# Patient Record
Sex: Male | Born: 1935
Health system: Southern US, Community
[De-identification: ages and names within clinical notes are randomized; demographics above are authoritative.]

## PROBLEM LIST (undated history)

## (undated) DIAGNOSIS — J189 Pneumonia, unspecified organism: Secondary | ICD-10-CM

## (undated) DIAGNOSIS — G4730 Sleep apnea, unspecified: Secondary | ICD-10-CM

## (undated) DIAGNOSIS — F419 Anxiety disorder, unspecified: Secondary | ICD-10-CM

## (undated) DIAGNOSIS — I519 Heart disease, unspecified: Secondary | ICD-10-CM

## (undated) DIAGNOSIS — I2510 Atherosclerotic heart disease of native coronary artery without angina pectoris: Secondary | ICD-10-CM

## (undated) DIAGNOSIS — I4892 Unspecified atrial flutter: Secondary | ICD-10-CM

## (undated) DIAGNOSIS — Z9289 Personal history of other medical treatment: Secondary | ICD-10-CM

## (undated) DIAGNOSIS — K5900 Constipation, unspecified: Secondary | ICD-10-CM

## (undated) DIAGNOSIS — R0602 Shortness of breath: Secondary | ICD-10-CM

## (undated) DIAGNOSIS — C439 Malignant melanoma of skin, unspecified: Secondary | ICD-10-CM

## (undated) DIAGNOSIS — I5023 Acute on chronic systolic (congestive) heart failure: Secondary | ICD-10-CM

## (undated) DIAGNOSIS — I10 Essential (primary) hypertension: Secondary | ICD-10-CM

## (undated) DIAGNOSIS — M1990 Unspecified osteoarthritis, unspecified site: Secondary | ICD-10-CM

## (undated) DIAGNOSIS — I499 Cardiac arrhythmia, unspecified: Secondary | ICD-10-CM

## (undated) DIAGNOSIS — E785 Hyperlipidemia, unspecified: Secondary | ICD-10-CM

## (undated) DIAGNOSIS — D369 Benign neoplasm, unspecified site: Secondary | ICD-10-CM

## (undated) DIAGNOSIS — K219 Gastro-esophageal reflux disease without esophagitis: Secondary | ICD-10-CM

## (undated) DIAGNOSIS — S82899A Other fracture of unspecified lower leg, initial encounter for closed fracture: Secondary | ICD-10-CM

## (undated) DIAGNOSIS — F329 Major depressive disorder, single episode, unspecified: Secondary | ICD-10-CM

## (undated) DIAGNOSIS — I509 Heart failure, unspecified: Secondary | ICD-10-CM

## (undated) DIAGNOSIS — M4800 Spinal stenosis, site unspecified: Secondary | ICD-10-CM

## (undated) DIAGNOSIS — F32A Depression, unspecified: Secondary | ICD-10-CM

## (undated) DIAGNOSIS — N419 Inflammatory disease of prostate, unspecified: Secondary | ICD-10-CM

## (undated) HISTORY — DX: Unspecified osteoarthritis, unspecified site: M19.90

## (undated) HISTORY — PX: ROTATOR CUFF REPAIR: SHX139

## (undated) HISTORY — DX: Malignant melanoma of skin, unspecified: C43.9

## (undated) HISTORY — DX: Essential (primary) hypertension: I10

## (undated) HISTORY — PX: BACK SURGERY: SHX140

## (undated) HISTORY — PX: OTHER SURGICAL HISTORY: SHX169

## (undated) HISTORY — DX: Benign neoplasm, unspecified site: D36.9

## (undated) HISTORY — PX: HERNIA REPAIR: SHX51

## (undated) HISTORY — PX: JOINT REPLACEMENT: SHX530

## (undated) HISTORY — PX: LUMBAR LAMINECTOMY/DECOMPRESSION MICRODISCECTOMY: SHX5026

## (undated) HISTORY — DX: Atherosclerotic heart disease of native coronary artery without angina pectoris: I25.10

## (undated) HISTORY — PX: APPENDECTOMY: SHX54

## (undated) HISTORY — PX: CORONARY ANGIOPLASTY: SHX604

---

## 1998-08-31 ENCOUNTER — Inpatient Hospital Stay (HOSPITAL_COMMUNITY): Admission: EM | Admit: 1998-08-31 | Discharge: 1998-09-02 | Payer: Self-pay | Admitting: Emergency Medicine

## 1998-08-31 ENCOUNTER — Encounter: Payer: Self-pay | Admitting: Emergency Medicine

## 1998-09-01 ENCOUNTER — Encounter: Payer: Self-pay | Admitting: Internal Medicine

## 1999-12-17 ENCOUNTER — Encounter: Admission: RE | Admit: 1999-12-17 | Discharge: 1999-12-17 | Payer: Self-pay | Admitting: Internal Medicine

## 1999-12-17 ENCOUNTER — Encounter: Payer: Self-pay | Admitting: Internal Medicine

## 2000-03-18 ENCOUNTER — Encounter: Payer: Self-pay | Admitting: Cardiovascular Disease

## 2000-03-18 ENCOUNTER — Ambulatory Visit (HOSPITAL_COMMUNITY): Admission: RE | Admit: 2000-03-18 | Discharge: 2000-03-19 | Payer: Self-pay | Admitting: Cardiovascular Disease

## 2000-03-18 HISTORY — PX: CARDIAC CATHETERIZATION: SHX172

## 2000-08-25 ENCOUNTER — Encounter: Admission: RE | Admit: 2000-08-25 | Discharge: 2000-11-23 | Payer: Self-pay | Admitting: Cardiovascular Disease

## 2001-01-19 ENCOUNTER — Encounter: Admission: RE | Admit: 2001-01-19 | Discharge: 2001-04-19 | Payer: Self-pay | Admitting: Cardiovascular Disease

## 2001-05-25 ENCOUNTER — Encounter: Admission: RE | Admit: 2001-05-25 | Discharge: 2001-08-23 | Payer: Self-pay | Admitting: Cardiovascular Disease

## 2001-08-22 ENCOUNTER — Encounter: Payer: Self-pay | Admitting: Internal Medicine

## 2001-08-22 ENCOUNTER — Encounter: Admission: RE | Admit: 2001-08-22 | Discharge: 2001-08-22 | Payer: Self-pay | Admitting: Internal Medicine

## 2002-11-01 ENCOUNTER — Encounter: Payer: Self-pay | Admitting: Internal Medicine

## 2002-11-01 ENCOUNTER — Encounter: Admission: RE | Admit: 2002-11-01 | Discharge: 2002-11-01 | Payer: Self-pay | Admitting: Internal Medicine

## 2003-09-30 ENCOUNTER — Encounter
Admission: RE | Admit: 2003-09-30 | Discharge: 2003-12-18 | Payer: Self-pay | Admitting: Physical Medicine and Rehabilitation

## 2003-10-15 ENCOUNTER — Encounter
Admission: RE | Admit: 2003-10-15 | Discharge: 2003-11-21 | Payer: Self-pay | Admitting: Physical Medicine and Rehabilitation

## 2003-11-12 ENCOUNTER — Encounter: Admission: RE | Admit: 2003-11-12 | Discharge: 2003-11-12 | Payer: Self-pay | Admitting: Internal Medicine

## 2003-12-17 ENCOUNTER — Encounter
Admission: RE | Admit: 2003-12-17 | Discharge: 2004-03-16 | Payer: Self-pay | Admitting: Physical Medicine and Rehabilitation

## 2003-12-18 ENCOUNTER — Encounter
Admission: RE | Admit: 2003-12-18 | Discharge: 2004-02-14 | Payer: Self-pay | Admitting: Physical Medicine and Rehabilitation

## 2003-12-20 ENCOUNTER — Ambulatory Visit: Payer: Self-pay | Admitting: Physical Medicine and Rehabilitation

## 2004-02-14 ENCOUNTER — Encounter
Admission: RE | Admit: 2004-02-14 | Discharge: 2004-05-14 | Payer: Self-pay | Admitting: Physical Medicine and Rehabilitation

## 2004-02-19 ENCOUNTER — Ambulatory Visit: Payer: Self-pay | Admitting: Physical Medicine and Rehabilitation

## 2004-06-03 ENCOUNTER — Encounter: Admission: RE | Admit: 2004-06-03 | Discharge: 2004-06-03 | Payer: Self-pay | Admitting: Internal Medicine

## 2004-09-15 ENCOUNTER — Ambulatory Visit (HOSPITAL_COMMUNITY): Admission: RE | Admit: 2004-09-15 | Discharge: 2004-09-15 | Payer: Self-pay | Admitting: Urology

## 2005-06-24 ENCOUNTER — Ambulatory Visit (HOSPITAL_COMMUNITY): Admission: RE | Admit: 2005-06-24 | Discharge: 2005-06-24 | Payer: Self-pay | Admitting: Endocrinology

## 2005-08-12 ENCOUNTER — Encounter: Admission: RE | Admit: 2005-08-12 | Discharge: 2005-08-12 | Payer: Self-pay | Admitting: Internal Medicine

## 2008-03-12 ENCOUNTER — Ambulatory Visit: Payer: Self-pay | Admitting: Internal Medicine

## 2008-03-22 ENCOUNTER — Ambulatory Visit: Payer: Self-pay | Admitting: Internal Medicine

## 2008-07-08 ENCOUNTER — Ambulatory Visit: Payer: Self-pay | Admitting: Internal Medicine

## 2008-07-23 ENCOUNTER — Ambulatory Visit: Payer: Self-pay | Admitting: Internal Medicine

## 2008-09-17 ENCOUNTER — Ambulatory Visit: Payer: Self-pay | Admitting: Internal Medicine

## 2008-09-17 ENCOUNTER — Encounter: Admission: RE | Admit: 2008-09-17 | Discharge: 2008-09-17 | Payer: Self-pay | Admitting: Internal Medicine

## 2009-03-25 ENCOUNTER — Ambulatory Visit: Payer: Self-pay | Admitting: Internal Medicine

## 2009-06-17 LAB — HM COLONOSCOPY

## 2009-07-17 ENCOUNTER — Ambulatory Visit: Payer: Self-pay | Admitting: Internal Medicine

## 2009-12-31 ENCOUNTER — Ambulatory Visit: Payer: Self-pay | Admitting: Internal Medicine

## 2010-02-18 ENCOUNTER — Emergency Department (HOSPITAL_COMMUNITY): Admission: EM | Admit: 2010-02-18 | Discharge: 2010-02-18 | Payer: Self-pay | Admitting: Emergency Medicine

## 2010-07-07 ENCOUNTER — Ambulatory Visit: Payer: Self-pay | Admitting: Internal Medicine

## 2010-09-04 NOTE — Assessment & Plan Note (Signed)
REFERRING PHYSICIAN:  Luanna Cole. Lenord Fellers, M.D.   Mitchell [name] is a 76 year old gentleman who was seen initially on October 02, 2003.  He has a history of hypercholesterolemia, post heart stent, history  of melanoma, history of cystitis, and hypertension.  He is currently on CPAP  at night.   He is being seen in our pain and rehabilitation clinic for left hip and leg  pain after walking more than 200 feet.  He is here for a recheck.  He had  been started in physical therapy and is currently involved in a therapy  program to increase lower extremity strength and conditioning and also for  weight loss.   The patient reports he has been doing fairly well.  He typically will have  three or four good days interspersed with two to three bad days.   His wife has accompanied him today and reports that he actually does sleep  fairly well most of the time.  Occasionally he does have some increased pain  at night and this interferes with his sleeping.  The patient reports he has  really only needed to take the MS Contin two times since I have seen him and  he wakes up a little sleepier than he would like to, so he intends to use it  only when he is having quite a bit of pain.  His wife reports that he  typically will get increased pain when he does more than she thinks that he  physically should be doing.  It seems there may be a problem with some  pacing as well.   Mitchell [name] reports a 15-pound weight loss today and is overall quite pleased  with his current care plan.   PHYSICAL EXAMINATION:  VITAL SIGNS:  Exam reveals blood pressure 127/78,  respirations 20, pulse of 60, and 95% saturated on room air.  GENERAL APPEARANCE:  Mitchell [name] is alert, oriented, overall in good spirits,  and cooperative.  HEART:  Heart sounds are distant appear to be regular.  LUNGS:  Clear.  ABDOMEN:  Benign.  EXTREMITIES:  Do not reveal any edema.  He has full extension of both knees.  NEUROLOGIC:  He has a mildly  antalgic gait with a wider base and short  stride length with suggestion of a foot drop on the right.  Limitations in  lumbar spine range are unchanged from the last visit.  The hip has 5/5  strength at the hip flexors and knee extensors.  Dorsiflexors on the right  are 4/5 and on the left are 5/5.  Evertors on the left are 4/5 and on the  right 5/5.  EHL on the left is 4/5 and EHL on the right is 4-/5.   IMPRESSION:  1. Status post bilateral knee replacements.  2. Left hip osteoarthritis with painful left hip.  3. Status post right foot drop with some weakness.  4. Bilateral extensor hallicis longus weakness with decreased sensation in     the L5 dermatomes.  5. Suggestion of spinal stenosis.   PLAN:  Will continue the current physical therapy program.  Continue Ultram,  as well as Lidoderm.  Again will encourage [facility] aquatic program once the  physical therapy program is discontinued.  Obtain lumbar MRI.  Consider  epidural sternoid injection.  Will see the patient back in one month.      [name], M.D.   DMK/MedQ  D:  11/06/2003 17:56:12  T:  11/06/2003 21:07:30  Job #:  [id number]   cc:   Luanna Cole. Lenord Fellers, M.D.  [address], [place]  [place]  [place] [address]  Fax: [phone]

## 2010-09-04 NOTE — Group Therapy Note (Signed)
MEDICAL RECORD NUMBER:  [id number]   REFERRING PHYSICIAN:  Luanna Cole. Lenord Fellers, M.D.   HISTORY:  Mr. Liszewski is a 76 year old gentleman who was referred by Dr.  Lenord Fellers for pain evaluation and management.  Mr. Nuttall is accompanied by his  daughter today who helps relate his history.   Mr. Rakes has a history of bilateral knee replacements,  hypercholesterolemia, status post a heart stent in 2002, history of melanoma  and hypertension.  He is on C-PAP at night.   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   His main pain complaints are his left hip and left leg after walking more  than 200 feet.  He has been treated with Naprosyn as well as Ultram.  He  feels that this combination has worked well for him during the day.  His  biggest problem is in the evening when he is trying to relax and rest at  night.  He has some trouble with sleep because of the increased pain in his  hip and back area.   The patient denies any constant numbness or tingling.  He reports that he  does get some weakness in what appears to be the left quadriceps area after  prolonged walking.  He denies any new bowel or bladder problems.  Denies any  spasms or clumsiness.  He reports that the left leg is not as strong and  often he uses the right leg to go up steps.   FUNCTIONAL STATUS:  The patient is independent with ADLs as well as  mobility. Very active and high functioning gentleman.   EMOTIONAL STATUS:  The patient has good coping skills and is not suicidal.   ALLERGIES:  The patient denies any drug allergies.   Health and history form are reviewed.  He reports he has had some trouble  with his heart which was the stent placement in 2002.  He notes that he  sleeps poorly.  Has some issues with constipation occasionally and reports a  weight gain and some bruising.   PAST MEDICAL HISTORY:  1. Hypercholesterolemia.  2. Status post heart stent.  3. History of melanoma.  4.  Cystitis.  5. Hypertension.  6. C-PAP.   PAST SURGICAL HISTORY:  1. In 1995 he had a lumbar surgery at 3 levels.  2. In 1999 he had right knee replacement.  3. In 2000 he had left knee replacement.  4. He had an appendectomy and a herniorrhaphy remotely.   CURRENT MEDICATIONS:  1. Naprosyn 500 mg 1 p.o. b.i.d. (which he actually takes only once a day     currently).  2. Vasotec 10 mg 1 p.o. daily.  3. Atenolol 25 mg 1 daily.  4. Tramadol 50 t.i.d. however he takes it only twice daily.  5. Aspirin daily.  6. Saw Palmetto 450 mg 2 daily.  7. Glucosamine Chondroitin.  8. MOM and Metamucil.   PHYSICAL EXAMINATION:  GENERAL:  He is a well-developed, obese gentleman who  does not present in any distress during our interview.  VITAL SIGNS:  Blood pressure is 139/63, pulse 53, respirations 20, 95%  saturated on room air.  NEUROLOGIC:  He is appropriate, cooperative.  SKIN:  Is remarkable for well-healed scars over bilateral knees.  He has a  well-healed scar over his lumbar area.  NECK:  Shows functional range of motion without pain.  SHOULDERS:  Full range of motion without pain.  HIPS:  Range of motion is  limited, especially on the left with internal  rotation and he has pain with this.  His right hip is also somewhat limited  especially with internal rotation but non painful.  KNEES:  He has well-healed scars over both knees.  He is able to get to full  extension with both knees and at least 130 degrees of flexion bilaterally.   He has a mildly antalgic wide based gait with a short stride length,  suggestion of a foot drop on the right, not significant during the gait  cycle however.   He has limited spinal range of motion, especially lumbar flexion and  extension, about  5 to 10 degrees of lateral flexion bilaterally.  Seated, reflexes are 0 at  the knees, 0 at both Achilles tendons.  Toes are down going, no clonus is  noted.  Reflexes at biceps, triceps, brachioradialis 2+  bilaterally.   Sensation is intact in the upper extremities with pin prick and light touch  in C5 through T1 dermatomes.  In the lower extremities he has decreased  sensation in bilateral L5 dermatomes and slightly in the S1 dermatomes, not  as prominent however.   He has 5/5 strength in the upper extremities without any focal weakness.  In  the lower extremities 5/5 strength is noted at the hip flexors, knee  extensors, right dorsiflexor,  4/5 left dorsiflexor is 5/5.  Left everters are 4+ out of 5, right everters  are 4- out of 5.  Left EHL is 4 out of 5, right EHL is 4- out of 5.   IMPRESSION:  1. Status post bilateral knee replacements.  2. Left hip osteoarthritis with painful left hip.  3. Status post right foot drop with some weakness yet.  4. Bilateral EHL weakness with decreased sensation in the L5 dermatomes.  5. Suggestion of spinal stenosis by history.  6. Poor sleep secondary to pain.  7. Weight gain.   PLAN:  Will continue patient on Naprosyn in the morning with Ultram, he will  take the Ultram again during the mid day.  For evening will add MS Contin 15  mg.  Will also add Prilosec and Lidoderm p.r.n.  Will start him in a  physical therapy program twice weekly for 6 weeks to work on a gentle  strengthening and conditioning program.  Will give him some information  regarding the [employer] aquatic program and will see him back in 1 month.  He  will  call us if he has any problems with his medications or any problems with the  physical therapy program.  Will also send a copy of this note over to Dr.  Lenord Fellers.     [name], M.D.    DMK/MedQ  D:  10/02/2003 15:33:31  T:  10/02/2003 19:29:56  Job #:  [id number]    cc:  [facility]

## 2010-09-04 NOTE — Assessment & Plan Note (Signed)
MEDICAL RECORD NUMBER:  [id number].   DATE OF BIRTH:  1935/01/13.   [name] is a 76 year old pleasant gentleman who is accompanied by his  wife today.  He is being seen in our pain and rehabilitative clinic for  spinal stenosis and intermittent leg pain.  He also has a history of  bilateral knee replacements.   His medical problems include the following:  1.  Hypercholesterolemia.  2.  Status post heart stent.  3.  Status post history of melanoma.  4.  History  of cystitis.  5.  History of hypertension.  6.  History of CPAP at night.   [name] has recently got himself set up to start a water therapy program.  Overall he has done quite well in general with the physical therapy program.  He has lost approximately 40 pounds over the last several months.   Overall he reports that the pain his back and left leg is about a 5 on a  scale of 10 on the average.  It can go up to a 6 and down to a 4.   It is still somewhat difficult for him to rise up out of a chair, especially  a low chair, and repetitive flexing activities also bother him.   He reports recently his mobility has improved.  He is able to dry between  his toes with a towel recently, which he seemed to be quite pleased with.   He denies any new problems with bowel or bladder.  Denies any new numbness,  tingling or weakness or any gait changes.   MEDICATIONS:  He recently was started on Naprosyn 500 mg b.i.d.  This seems  to help.  He also is on Ultram and seems to be tolerating that as well.  He  takes that three times a day.  When he has taken the hydrocodone in the  past, he has reported some increased bloating with it.   Overall sleep has been good.   Denies suicidal ideation.   Denies any recent change in his medical history.   PHYSICAL EXAMINATION:  An alert, oriented, pleasant gentleman.  Blood  pressure 155/86, pulse 74, respirations 20, 95% saturated on room air.   He is able to get out of the chair.  He  has to push himself up a bit with  his upper extremities.  Once he is up, his gait in the room is stable.  Romberg's test is negative.  Seated reflexes are 0 at the knees and ankles.  No clonus noted.  Motor strength overall is 5/5 in the lower extremities  with the exception of left hip flexors, which are approximately 4-4+/5 on  the left hip flexors.   IMPRESSION:  1.  Severe lumbar stenosis.  2.  Status post bilateral knee replacements.  3.  Variable right foot.  Dorsiflexors show good strength today.  4.  General lower extremity deconditioning which is overall improved since      therapy.   PLAN:  Will refill hydrocodone 5/325 mg one p.o. t.i.d. p.r.n., #90.  Discussed the habituating nature of both Ultram, as well as hydrocodone, and  sided effects such as sedation and constipation.  Will recommend he take  some Dulcolax tablets two each night when he is on these medications to help  with his bowels.  When he is on his Naprosyn, would have him take Prilosec  20 mg one p.o. daily.  Instead of continuous Naprosyn, would consider  approximately 15 days  a month to try to avoid problems with gastritis,  gastric ulcers and long-term effects.  We discussed possibly having him  alternate Naprosyn and the hydrocodone.   Will see him back then in one month.  Encourage the arthritis pool program.       DMK/MedQ  D:  02/19/2004 12:40:52  T:  02/19/2004 14:47:02  Job #:  [id number]   cc:   Luanna Cole. Lenord Fellers, M.D.  [address] [place]  [place]  [place] [address]  Fax: [phone]

## 2010-09-04 NOTE — Assessment & Plan Note (Signed)
REFERRAL SOURCE:  Luanna Cole. Lenord Fellers, M.D.   Mitchell [name] is a 76 year old gentleman who is accompanied by his wife today  at his recheck visit.  He has a history of hypercholesterolemia, status post  heart stent, history of melanoma, history of cystitis, history of  hypertension, on CPAP at night.   He has been seen in pain and rehabilitative clinic for left posterior hip  pain and leg pain after walking more than 200 feet.   His MRI scan was recently obtained which showed involvement at that  particular level, L3-4.  There is a grade I anterior slip as well as  advanced facet arthropathy with synovial cyst projecting into the spinal  canal and contributing to spinal stenosis.  In addition, there was also some  bulging of a disk, and moderate to severe central stenosis at that level was  noted as well.   At the L5-S1 level, there was significant bilateral foraminal stenosis  secondary to facet disease and spondylosis as well as disk bulging.   These results were reviewed with him.  He has been set up to see a surgeon  this afternoon by Dr. Lenord Fellers I believe.  We reviewed options at this point  using various medications, injections, and following up with the surgeon.   He is requesting to return back to physical therapy.  He feels that it has  been rather successful in improving his overall state.  So far, he has been  tolerating the Naproxen as well as the Tramadol and Lidoderm.  He rarely  uses MS Contin at night.   PHYSICAL EXAMINATION:  VITAL SIGNS:  Blood pressure 120/71, pulse 52,  respirations 19, 95% saturation on room air.  GENERAL:  Alert and oriented, pleasant, and cooperative.  NEUROLOGIC:  He is able to get out to the chair.  Gait in the room is  stable. Seated reflexes are diminished at the knees and ankles.  Toes are  downgoing.  No clonus is noted.  Internal and external rotation of the right  and left hip does not provoke any groin pain.  Motor strength is unchanged  from last visit, essentially 5/5 at hip flexors and extensors.  Dorsiflexors  are 4/5 on the right, 5/5 on the left.  Everters are 4/5 on the right and  5/5 on the left.  EHL is 4/5 on the left and 4-/5 on the right.   IMPRESSION:  1. Lumbar spinal stenosis secondary to multiple factors and more of a     central canal sensors at L2-3 and a lateral stenosis at L5-S1.  2. History of right foot drop.  3. History of left hip osteoarthritis.  4. Status post bilateral knee replacement.   PLAN:  1. The patient will follow up with Dr. Channing Mutters today at 5 p.m.  2. We will see him back in two weeks.  3. Will restart of physical therapy program with emphlysis on lower     extremity conditioning.  Will avoid extension type exercises given that     he has a stenotic spine.  4. Will consider epidural block with him.  5. Will also consider TENS unit>      [name], M.D.   DMK/MedQ  D:  12/04/2003 16:29:07  T:  12/05/2003 60:45:40  Job #:  [id number]   cc:   Luanna Cole. Lenord Fellers, M.D.  [address]  [place]  [place] [address]  Fax: [phone]   Payton Doughty, M.D.  [address]  [place]  [place] [address]  Fax: [phone]

## 2010-09-04 NOTE — Assessment & Plan Note (Signed)
BRIEF HISTORY:  [name] is a 76 year old gentleman who is being seen in  our pain and rehabilitative clinic for low back and leg pain which was  related to severe spinal stenosis.  He also has a history of  hypocholesterolemia, status post heart stent, status post history of  melanoma and history of cystitis and hypertension.  He is also on CPAP at  night.   He recently followed up with Dr. Trey Sailors for evaluation of his lumbar  spine, apparently at this time Dr. Channing Mutters would like to continue embarking on  the conservative management including physical therapy, apparently there is  some concern whether or not any kind of spine injection would be of benefit  for him at this time.   Mr. Hence reports that his pain has been stable, he has no new problems  with any kind of  bowel or bladder, new weakness or gait changes, basically  the pain is the same located in the low back, it radiates down mostly into  the left leg to about the knee.  He is painfree when he is sitting and not  active, his pain is worse when he is standing for any period of time and  does any kind of walking.   He is functional with feeding, dressing, bathing, orofacial hygiene, he is  able to walk short distances without difficulty.   Sleep is overall good.   Denies harm to self or others.   He denies any change in his current medical history.   MEDICATIONS:  Medications at this time include naproxen 500 one p.o. b.i.d.,  Vasotec 10 mg one p.o. once daily, atenolol 25 mg once daily, tramadol 500  one three times a day, aspirin once daily, saw palmetto 450 two once daily,  glucosamine chondroitin, Vear Clock' MOM and Prilosec.   REVIEW OF SYSTEMS:  He reports recent gastric upset after taking Naprosyn.   EXAMINATION:  On exam today he is alert, oriented, appears comfortable  during our interview.   Blood pressure is 112/64, pulse 64, respirations 20, 97% saturated on room  air.  He is able to get out of the  chair and walk in the room.  His gait is  overall fairly stable.  Romberg test is negative in coordination.  Seated  reflexes are diminished at the knees and ankles.  Toes are downgoing.  No  clonus is noted.  Motor strength is stable from last exam as well.   IMPRESSION:  1.  Severe lumbar spinal stenosis.  2.  Status post bilateral knee replacements.  3.  Left hip osteoarthritis/left hip pain.  4.  Mild right foot drop.  5.  Bilateral extensor hallucis longus.  6.  Weakness with decreased sensation in the L5 dermatomes.  7.  General lower extremity deconditioning.   PLAN:  We will continue physical therapy as planned.  The therapists will  consider a trial with a TENS unit.  Continue to use milk of magnesia for  constipation, he has tried Dulcolax as well as Senokot and fiber type  compounds in the past without good result.  He has been doing well with the  milk of magnesia.   We discussed the adverse actions one can see with nonsteroidal anti-  inflammatory medications.  I have concerns with this him being on the  Naprosyn for any length of time.  He does have a history of gastric ulcer  and is currently experiencing some gastric upset.  He reported he did stop  his Naprosyn for a short time but found it very difficult to get up out of  the chair and move around without any kind of anti-inflammatory.  I again  reiterate the possibility of gastric bleed using these compounds.  He is  quite interested in continuing some kind of nonsteroidal.  Would like to  switch him from Naprosyn to Lodine 300 mg one p.o. b.i.d.  We will continue  the Prilosec.  We will add Neurontin as well today which is good for  neuropathic type pain, we will start him on a very small dose 300 mg once  daily, we will increase it if he seems to tolerate it quite well.  Cautioned  regarding sedation and ataxia.  Reviewed this quite a bit with he and his  wife.  We will also consider Topamax if Neurontin is not  tolerated well.  I  would also like to talk further with Dr. Channing Mutters at some point, apparently he is  going to be out of town for a bit, we will need to try to communicate within  the next 4-6 weeks.  We will see him back then in 1 month.  We will have a  copy of this note sent over to Dr. Beryle Quant office.      [name], M.D.   DMK/MedQ  D:  12/20/2003 13:16:16  T:  12/21/2003 16:10:96  Job #:  [id number]   cc:   Luanna Cole. Mitchell [name], M.D.  [address], [name]  [place]  [place] [address]  Fax: [phone]

## 2010-09-15 ENCOUNTER — Other Ambulatory Visit: Payer: Self-pay | Admitting: Internal Medicine

## 2010-09-15 ENCOUNTER — Other Ambulatory Visit: Payer: Medicare Other | Admitting: Internal Medicine

## 2010-09-15 DIAGNOSIS — Z79899 Other long term (current) drug therapy: Secondary | ICD-10-CM

## 2010-09-15 DIAGNOSIS — E785 Hyperlipidemia, unspecified: Secondary | ICD-10-CM

## 2010-09-15 LAB — LIPID PANEL
Cholesterol: 177 mg/dL (ref 0–200)
HDL: 38 mg/dL — ABNORMAL LOW (ref >39)
LDL Cholesterol: 117 mg/dL — ABNORMAL HIGH (ref 0–99)
Total CHOL/HDL Ratio: 4.7 Ratio
Triglycerides: 110 mg/dL (ref <150)
VLDL: 22 mg/dL (ref 0–40)

## 2010-09-15 LAB — HEPATIC FUNCTION PANEL
ALT: 13 U/L (ref 0–53)
AST: 16 U/L (ref 0–37)
Albumin: 4.2 g/dL (ref 3.5–5.2)
Alkaline Phosphatase: 85 U/L (ref 39–117)
Bilirubin, Direct: 0.1 mg/dL (ref 0.0–0.3)
Indirect Bilirubin: 0.3 mg/dL (ref 0.0–0.9)
Total Bilirubin: 0.4 mg/dL (ref 0.3–1.2)
Total Protein: 6.5 g/dL (ref 6.0–8.3)

## 2010-09-17 ENCOUNTER — Ambulatory Visit (INDEPENDENT_AMBULATORY_CARE_PROVIDER_SITE_OTHER): Payer: Medicare Other | Admitting: Internal Medicine

## 2010-09-17 ENCOUNTER — Encounter: Payer: Self-pay | Admitting: Internal Medicine

## 2010-09-17 DIAGNOSIS — I10 Essential (primary) hypertension: Secondary | ICD-10-CM

## 2010-09-17 DIAGNOSIS — D352 Benign neoplasm of pituitary gland: Secondary | ICD-10-CM | POA: Insufficient documentation

## 2010-09-17 DIAGNOSIS — J45909 Unspecified asthma, uncomplicated: Secondary | ICD-10-CM | POA: Insufficient documentation

## 2010-09-17 DIAGNOSIS — E229 Hyperfunction of pituitary gland, unspecified: Secondary | ICD-10-CM

## 2010-09-17 DIAGNOSIS — M1990 Unspecified osteoarthritis, unspecified site: Secondary | ICD-10-CM | POA: Insufficient documentation

## 2010-09-17 DIAGNOSIS — Z86010 Personal history of colonic polyps: Secondary | ICD-10-CM

## 2010-09-17 DIAGNOSIS — R5381 Other malaise: Secondary | ICD-10-CM

## 2010-09-17 DIAGNOSIS — R5383 Other fatigue: Secondary | ICD-10-CM

## 2010-09-17 DIAGNOSIS — G4730 Sleep apnea, unspecified: Secondary | ICD-10-CM

## 2010-09-17 DIAGNOSIS — Z85820 Personal history of malignant melanoma of skin: Secondary | ICD-10-CM

## 2010-09-17 DIAGNOSIS — Z860101 Personal history of adenomatous and serrated colon polyps: Secondary | ICD-10-CM

## 2010-09-17 DIAGNOSIS — I2510 Atherosclerotic heart disease of native coronary artery without angina pectoris: Secondary | ICD-10-CM

## 2010-09-17 DIAGNOSIS — Z86006 Personal history of melanoma in-situ: Secondary | ICD-10-CM

## 2010-09-17 DIAGNOSIS — I259 Chronic ischemic heart disease, unspecified: Secondary | ICD-10-CM

## 2010-09-17 DIAGNOSIS — E785 Hyperlipidemia, unspecified: Secondary | ICD-10-CM | POA: Insufficient documentation

## 2010-09-17 LAB — TSH: TSH: 1.371 u[IU]/mL (ref 0.350–4.500)

## 2010-09-17 LAB — T4, FREE: Free T4: 1.14 ng/dL (ref 0.80–1.80)

## 2010-09-17 NOTE — Progress Notes (Signed)
  Subjective:    Patient ID: Mitchell [name], male    DOB: 1934/08/27, 76 y.o.   MRN: [id number]  HPI Pt with hx of CAD s/p PCI posterolateral branch with bare metal stent in 2001. Hx HTN, morbid obesity, osteoarthritis, s/p bilateral knee and hip replacements and recent left shoulder repair. Hx superficial melanoma left neck,asthma, adenomatous polyp with colonoscopy done 3/11. Hx pituitary microadenoma with prolactinemia and hypogonadism followed by Dr.Balan. Dostinex 0.05mg  twice weekly had been prescribed but was subsequently discontinued by endocrinologist after some lab work normalized but was recently restarted. Pt c/o fatigue but has been undergoing rigorous shoulder PT. Has not been walking or doing other exercise. TSH and free T4 were checked today. Compliant with meds. Lipid panel shows LDL of 117 and total cholesterol of 177 on Zetia. Does not follow a strict diet.    Review of Systems     Objective:   Physical Exam Chest clear, Cor:RRR without ectopy or murmur; Ext without edema.        Assessment & Plan:  1-CAD continue Zetia and recheck lipid panel with CPE in 6 months. Follow up with cardiologist in 12/12.  2-HTN well controlled on current regimen 3-Morbid obesity 4-Fatigue-? Related to shoulder surgery and deconditioning-check TFTs 5-Sleep apnea- will not wear CPAP device- says it is uncomfortable Plan- consider PT for quadriceps strengthening or perhaps Cardiac rehab for reconditioning.

## 2010-09-17 NOTE — Patient Instructions (Signed)
Continue your current meds. See me in 6 months. See Dr. Allyson Sabal Dec 2012. You may want to consider PT for quadriceps strengthening. You need to walk some each day.

## 2010-09-18 ENCOUNTER — Encounter: Payer: Self-pay | Admitting: Internal Medicine

## 2010-10-14 ENCOUNTER — Other Ambulatory Visit: Payer: Self-pay

## 2010-10-14 MED ORDER — ENALAPRIL-HYDROCHLOROTHIAZIDE 10-25 MG PO TABS: 1.0000 | ORAL_TABLET | Freq: Every day | ORAL | Status: DC

## 2011-01-21 ENCOUNTER — Other Ambulatory Visit: Payer: Self-pay | Admitting: *Deleted

## 2011-01-21 MED ORDER — ATENOLOL 25 MG PO TABS: 25.0000 mg | ORAL_TABLET | Freq: Every day | ORAL | Status: DC

## 2011-03-22 ENCOUNTER — Other Ambulatory Visit: Payer: Medicare Other | Admitting: Internal Medicine

## 2011-03-22 LAB — CBC WITH DIFFERENTIAL/PLATELET
Basophils Absolute: 0 10*3/uL (ref 0.0–0.1)
Basophils Relative: 0 % (ref 0–1)
Eosinophils Absolute: 0.1 10*3/uL (ref 0.0–0.7)
Eosinophils Relative: 2 % (ref 0–5)
HCT: 45.4 % (ref 39.0–52.0)
Hemoglobin: 14.5 g/dL (ref 13.0–17.0)
Lymphocytes Relative: 25 % (ref 12–46)
Lymphs Abs: 2.1 10*3/uL (ref 0.7–4.0)
MCH: 30.9 pg (ref 26.0–34.0)
MCHC: 31.9 g/dL (ref 30.0–36.0)
MCV: 96.6 fL (ref 78.0–100.0)
Monocytes Absolute: 0.6 10*3/uL (ref 0.1–1.0)
Monocytes Relative: 8 % (ref 3–12)
Neutro Abs: 5.7 10*3/uL (ref 1.7–7.7)
Neutrophils Relative %: 66 % (ref 43–77)
Platelets: 248 10*3/uL (ref 150–400)
RBC: 4.7 MIL/uL (ref 4.22–5.81)
RDW: 14.3 % (ref 11.5–15.5)
WBC: 8.6 10*3/uL (ref 4.0–10.5)

## 2011-03-22 LAB — COMPREHENSIVE METABOLIC PANEL
ALT: 12 U/L (ref 0–53)
AST: 16 U/L (ref 0–37)
Albumin: 4.1 g/dL (ref 3.5–5.2)
Alkaline Phosphatase: 82 U/L (ref 39–117)
BUN: 21 mg/dL (ref 6–23)
CO2: 29 mEq/L (ref 19–32)
Calcium: 9.3 mg/dL (ref 8.4–10.5)
Chloride: 99 mEq/L (ref 96–112)
Creat: 0.81 mg/dL (ref 0.50–1.35)
Glucose, Bld: 101 mg/dL — ABNORMAL HIGH (ref 70–99)
Potassium: 4.7 mEq/L (ref 3.5–5.3)
Sodium: 138 mEq/L (ref 135–145)
Total Bilirubin: 0.5 mg/dL (ref 0.3–1.2)
Total Protein: 6.4 g/dL (ref 6.0–8.3)

## 2011-03-22 LAB — LIPID PANEL
Cholesterol: 158 mg/dL (ref 0–200)
HDL: 37 mg/dL — ABNORMAL LOW (ref >39)
LDL Cholesterol: 103 mg/dL — ABNORMAL HIGH (ref 0–99)
Total CHOL/HDL Ratio: 4.3 Ratio
Triglycerides: 90 mg/dL (ref <150)
VLDL: 18 mg/dL (ref 0–40)

## 2011-03-22 LAB — PSA: PSA: 0.84 ng/mL (ref <=4.00)

## 2011-03-23 ENCOUNTER — Ambulatory Visit (INDEPENDENT_AMBULATORY_CARE_PROVIDER_SITE_OTHER): Payer: Medicare Other | Admitting: Internal Medicine

## 2011-03-23 ENCOUNTER — Encounter: Payer: Self-pay | Admitting: Internal Medicine

## 2011-03-23 VITALS — BP 114/82 | HR 100 | Temp 98.4°F | Ht 71.5 in | Wt 301.0 lb

## 2011-03-23 DIAGNOSIS — Z86006 Personal history of melanoma in-situ: Secondary | ICD-10-CM

## 2011-03-23 DIAGNOSIS — E291 Testicular hypofunction: Secondary | ICD-10-CM

## 2011-03-23 DIAGNOSIS — I2510 Atherosclerotic heart disease of native coronary artery without angina pectoris: Secondary | ICD-10-CM

## 2011-03-23 DIAGNOSIS — M1990 Unspecified osteoarthritis, unspecified site: Secondary | ICD-10-CM

## 2011-03-23 DIAGNOSIS — Z85820 Personal history of malignant melanoma of skin: Secondary | ICD-10-CM

## 2011-03-23 DIAGNOSIS — N529 Male erectile dysfunction, unspecified: Secondary | ICD-10-CM

## 2011-03-23 DIAGNOSIS — Z23 Encounter for immunization: Secondary | ICD-10-CM

## 2011-03-23 DIAGNOSIS — Z87440 Personal history of urinary (tract) infections: Secondary | ICD-10-CM

## 2011-03-23 DIAGNOSIS — Z0000 Encounter for general adult medical examination without abnormal findings: Secondary | ICD-10-CM

## 2011-03-23 DIAGNOSIS — M4800 Spinal stenosis, site unspecified: Secondary | ICD-10-CM

## 2011-03-23 DIAGNOSIS — I10 Essential (primary) hypertension: Secondary | ICD-10-CM

## 2011-03-23 DIAGNOSIS — J45909 Unspecified asthma, uncomplicated: Secondary | ICD-10-CM

## 2011-03-23 DIAGNOSIS — N400 Enlarged prostate without lower urinary tract symptoms: Secondary | ICD-10-CM

## 2011-03-23 LAB — POCT URINALYSIS DIPSTICK
Bilirubin, UA: NEGATIVE
Blood, UA: NEGATIVE
Glucose, UA: NEGATIVE
Ketones, UA: NEGATIVE
Leukocytes, UA: NEGATIVE
Nitrite, UA: NEGATIVE
Protein, UA: NEGATIVE
Spec Grav, UA: 1.02
Urobilinogen, UA: NEGATIVE
pH, UA: 6.5

## 2011-04-14 ENCOUNTER — Other Ambulatory Visit: Payer: Self-pay

## 2011-04-14 MED ORDER — TRAMADOL HCL 50 MG PO TABS: 50.0000 mg | ORAL_TABLET | Freq: Four times a day (QID) | ORAL | Status: DC | PRN

## 2011-04-19 NOTE — Patient Instructions (Signed)
Please consider returning to physical therapy for gait strengthening. Please try to diet exercise and lose weight. Return in 6 months.

## 2011-04-21 DIAGNOSIS — R269 Unspecified abnormalities of gait and mobility: Secondary | ICD-10-CM | POA: Diagnosis not present

## 2011-04-21 DIAGNOSIS — IMO0002 Reserved for concepts with insufficient information to code with codable children: Secondary | ICD-10-CM | POA: Diagnosis not present

## 2011-04-22 DIAGNOSIS — I4892 Unspecified atrial flutter: Secondary | ICD-10-CM | POA: Diagnosis not present

## 2011-04-22 DIAGNOSIS — I10 Essential (primary) hypertension: Secondary | ICD-10-CM | POA: Diagnosis not present

## 2011-04-22 DIAGNOSIS — R5381 Other malaise: Secondary | ICD-10-CM | POA: Diagnosis not present

## 2011-04-22 DIAGNOSIS — I2510 Atherosclerotic heart disease of native coronary artery without angina pectoris: Secondary | ICD-10-CM | POA: Diagnosis not present

## 2011-04-22 DIAGNOSIS — Z79899 Other long term (current) drug therapy: Secondary | ICD-10-CM | POA: Diagnosis not present

## 2011-04-23 DIAGNOSIS — IMO0002 Reserved for concepts with insufficient information to code with codable children: Secondary | ICD-10-CM | POA: Diagnosis not present

## 2011-04-23 DIAGNOSIS — R269 Unspecified abnormalities of gait and mobility: Secondary | ICD-10-CM | POA: Diagnosis not present

## 2011-04-27 DIAGNOSIS — IMO0002 Reserved for concepts with insufficient information to code with codable children: Secondary | ICD-10-CM | POA: Diagnosis not present

## 2011-04-27 DIAGNOSIS — R269 Unspecified abnormalities of gait and mobility: Secondary | ICD-10-CM | POA: Diagnosis not present

## 2011-04-29 DIAGNOSIS — I4892 Unspecified atrial flutter: Secondary | ICD-10-CM | POA: Diagnosis not present

## 2011-04-29 DIAGNOSIS — R269 Unspecified abnormalities of gait and mobility: Secondary | ICD-10-CM | POA: Diagnosis not present

## 2011-04-29 DIAGNOSIS — IMO0002 Reserved for concepts with insufficient information to code with codable children: Secondary | ICD-10-CM | POA: Diagnosis not present

## 2011-05-04 DIAGNOSIS — R269 Unspecified abnormalities of gait and mobility: Secondary | ICD-10-CM | POA: Diagnosis not present

## 2011-05-04 DIAGNOSIS — IMO0002 Reserved for concepts with insufficient information to code with codable children: Secondary | ICD-10-CM | POA: Diagnosis not present

## 2011-05-05 DIAGNOSIS — I10 Essential (primary) hypertension: Secondary | ICD-10-CM | POA: Diagnosis not present

## 2011-05-05 DIAGNOSIS — I4892 Unspecified atrial flutter: Secondary | ICD-10-CM | POA: Diagnosis not present

## 2011-05-05 DIAGNOSIS — I739 Peripheral vascular disease, unspecified: Secondary | ICD-10-CM | POA: Diagnosis not present

## 2011-05-06 DIAGNOSIS — IMO0002 Reserved for concepts with insufficient information to code with codable children: Secondary | ICD-10-CM | POA: Diagnosis not present

## 2011-05-06 DIAGNOSIS — R269 Unspecified abnormalities of gait and mobility: Secondary | ICD-10-CM | POA: Diagnosis not present

## 2011-05-10 ENCOUNTER — Encounter: Payer: Self-pay | Admitting: Internal Medicine

## 2011-05-10 ENCOUNTER — Other Ambulatory Visit: Payer: Self-pay | Admitting: Cardiovascular Disease

## 2011-05-11 DIAGNOSIS — IMO0002 Reserved for concepts with insufficient information to code with codable children: Secondary | ICD-10-CM | POA: Diagnosis not present

## 2011-05-11 DIAGNOSIS — R269 Unspecified abnormalities of gait and mobility: Secondary | ICD-10-CM | POA: Diagnosis not present

## 2011-05-13 DIAGNOSIS — R269 Unspecified abnormalities of gait and mobility: Secondary | ICD-10-CM | POA: Diagnosis not present

## 2011-05-13 DIAGNOSIS — IMO0002 Reserved for concepts with insufficient information to code with codable children: Secondary | ICD-10-CM | POA: Diagnosis not present

## 2011-05-16 ENCOUNTER — Encounter: Payer: Self-pay | Admitting: Internal Medicine

## 2011-05-16 DIAGNOSIS — E349 Endocrine disorder, unspecified: Secondary | ICD-10-CM | POA: Insufficient documentation

## 2011-05-16 DIAGNOSIS — Z87440 Personal history of urinary (tract) infections: Secondary | ICD-10-CM | POA: Insufficient documentation

## 2011-05-16 DIAGNOSIS — N400 Enlarged prostate without lower urinary tract symptoms: Secondary | ICD-10-CM | POA: Insufficient documentation

## 2011-05-16 DIAGNOSIS — M4800 Spinal stenosis, site unspecified: Secondary | ICD-10-CM | POA: Insufficient documentation

## 2011-05-16 DIAGNOSIS — N529 Male erectile dysfunction, unspecified: Secondary | ICD-10-CM | POA: Insufficient documentation

## 2011-05-16 NOTE — Progress Notes (Signed)
Subjective:    Patient ID: Mitchell [name], male    DOB: 1935-03-15, 77 y.o.   MRN: [id number]  HPI 77 year old white male in today for evaluation of medical problems and health maintenance. He has a history of asthma, morbid obesity, hypertension, melanoma in situ left neck September 2000, coronary artery disease status post angioplasty September 2001, history of adenomatous colon polyp. Has undergone replacement of both knees and both hips in [place]. Had umbilical hernia repair May 1997. Had decompressive laminectomy at L3-L4 with spinal stenosis being diagnosed L4-L5 September 1995. History of right carpal tunnel release may 1991. History of appendectomy 1958. Had Pneumovax vaccine January 2004. Gets annual influenza immunization. Tetanus immunization September 2011. Had colonoscopy March 2011.  History of obstructive sleep apnea but doesn't like using CPAP machine. History of prolactinemia and was diagnosed with pituitary microadenoma by Dr. Anthonette Legato. He is on cabergoline 0.5 mg weekly. History of erectile dysfunction for which he uses Viagra or Levitra. History of hypogonadism. Dr. Anthonette Legato says he is not a candidate for testosterone replacement therapy with history of sleep apnea. He also has a history of osteopenia treated with calcium and vitamin D. History of hyperlipidemia treated with Aram Beecham. Had a bare-metal stent placed 03/18/2000 in posterolateral branch. Had moderate LAD and circumflex disease at that time.  Had significant urinary tract infection with fever 2006. Felt to have mild urethral stricturing. Hematuria 2002 requiring cystoscopy. Also had IVP with tomography at that time. History of BPH.  Had sigmoidoscopy by Dr. Barnett Abu 1998 and colonoscopy March 2011. Umbilical hernia repair by Dr. Francina Ames 1997. An echocardiogram at [name] November 2009. Diagnosed with Baker's cyst right popliteal fossa 1992. Cardiolite study November 2009 negative for  ischemia. History of asthma usually associated with respiratory infections.  Social history he is married: Has 2 adult children a son and a daughter. He is a self-employed Furniture conservator/restorer. Does not consume alcohol. No smoking history. History of adenomatous colon polyps on colonoscopy 06/23/2009 by Dr. Danise Edge.  Seems a little bit depressed today. Feels that he's not able to be as mobile as he would like. Might benefit from physical therapy. Has always been obese since I started seeing him in October 1989. At that time he weighed 287 pounds. October 1998 he weighed 329 pounds in August 2004 he weighed 338 pounds. However by March 2010 he had lost down to 283 pounds. In June 2011 he weighed 300 pounds. Patient had rotator cuff surgery 07/13/2010.  Family history: Father died at age 60 with an MI mother died at age 53 of old age. One sister in good health.    Review of Systems  Constitutional: Negative.   HENT: Negative.   Eyes: Negative.   Respiratory: Negative.   Cardiovascular: Negative.   Gastrointestinal: Negative.   Genitourinary: Positive for frequency.  Musculoskeletal: Positive for back pain and arthralgias.  Neurological: Negative.   Hematological: Negative.   Psychiatric/Behavioral: Positive for dysphoric mood.       Objective:   Physical Exam  Vitals reviewed. Constitutional: He is oriented to person, place, and time. He appears well-developed and well-nourished.       Morbidly obese  HENT:  Head: Normocephalic and atraumatic.  Right Ear: External ear normal.  Left Ear: External ear normal.  Mouth/Throat: Oropharynx is clear and moist.  Eyes: Conjunctivae and EOM are normal. Pupils are equal, round, and reactive to light. No scleral icterus.  Neck: No JVD present. No thyromegaly present.  Cardiovascular: Normal rate,  regular rhythm and normal heart sounds.   No murmur heard. Pulmonary/Chest: Effort normal and breath sounds normal. He has no wheezes. He has no  rales.  Genitourinary: Prostate normal.  Lymphadenopathy:    He has no cervical adenopathy.  Neurological: He is alert and oriented to person, place, and time. He has normal reflexes. No cranial nerve deficit. Coordination normal.  Skin: Skin is warm and dry.  Psychiatric:       Slightly tearful in the office today. Thought and judgment are normal.          Assessment & Plan:  Mild depression  Morbid obesity  History of asthma  History of urinary tract infections and BPH  History of hypertension  History of hyperlipidemia  History of coronary artery disease  History of bilateral hip and knee replacements  History of sleep apnea-refuses to wear CPAP  History of melanoma left neck  History of pituitary microadenoma with prolactinemia  History of erectile dysfunction  History of decreased testosterone level  Deconditioning  Plan: Return in 6 months. May want to consider mild dose of SSRI for depression. Ultram does not control his joint pain or back pain so we are going to prescribe Vicodin for him to take sparingly.

## 2011-05-18 DIAGNOSIS — R269 Unspecified abnormalities of gait and mobility: Secondary | ICD-10-CM | POA: Diagnosis not present

## 2011-05-18 DIAGNOSIS — IMO0002 Reserved for concepts with insufficient information to code with codable children: Secondary | ICD-10-CM | POA: Diagnosis not present

## 2011-05-19 ENCOUNTER — Other Ambulatory Visit: Payer: Self-pay

## 2011-05-20 DIAGNOSIS — R269 Unspecified abnormalities of gait and mobility: Secondary | ICD-10-CM | POA: Diagnosis not present

## 2011-05-20 DIAGNOSIS — IMO0002 Reserved for concepts with insufficient information to code with codable children: Secondary | ICD-10-CM | POA: Diagnosis not present

## 2011-05-24 ENCOUNTER — Encounter: Payer: Self-pay | Admitting: Internal Medicine

## 2011-05-24 ENCOUNTER — Ambulatory Visit (INDEPENDENT_AMBULATORY_CARE_PROVIDER_SITE_OTHER): Payer: Medicare Other | Admitting: Internal Medicine

## 2011-05-24 VITALS — BP 156/110 | HR 108 | Temp 98.3°F | Wt 304.0 lb

## 2011-05-24 DIAGNOSIS — I4891 Unspecified atrial fibrillation: Secondary | ICD-10-CM

## 2011-05-24 DIAGNOSIS — R531 Weakness: Secondary | ICD-10-CM

## 2011-05-24 DIAGNOSIS — I10 Essential (primary) hypertension: Secondary | ICD-10-CM | POA: Diagnosis not present

## 2011-05-24 DIAGNOSIS — I495 Sick sinus syndrome: Secondary | ICD-10-CM | POA: Diagnosis not present

## 2011-05-24 DIAGNOSIS — I2510 Atherosclerotic heart disease of native coronary artery without angina pectoris: Secondary | ICD-10-CM | POA: Diagnosis not present

## 2011-05-24 DIAGNOSIS — R5381 Other malaise: Secondary | ICD-10-CM

## 2011-05-24 DIAGNOSIS — R000 Tachycardia, unspecified: Secondary | ICD-10-CM

## 2011-05-25 ENCOUNTER — Other Ambulatory Visit: Payer: Self-pay

## 2011-05-25 ENCOUNTER — Inpatient Hospital Stay (HOSPITAL_COMMUNITY)
Admission: AD | Admit: 2011-05-25 | Discharge: 2011-05-26 | DRG: 308 | Disposition: A | Discharge status: Home / Self Care | Payer: Medicare Other | Source: Ambulatory Visit | Attending: Cardiovascular Disease | Admitting: Cardiovascular Disease

## 2011-05-25 ENCOUNTER — Inpatient Hospital Stay (HOSPITAL_COMMUNITY): Payer: Medicare Other

## 2011-05-25 ENCOUNTER — Encounter (HOSPITAL_COMMUNITY): Payer: Self-pay | Admitting: General Practice

## 2011-05-25 DIAGNOSIS — I4892 Unspecified atrial flutter: Principal | ICD-10-CM | POA: Diagnosis present

## 2011-05-25 DIAGNOSIS — I509 Heart failure, unspecified: Secondary | ICD-10-CM

## 2011-05-25 DIAGNOSIS — E669 Obesity, unspecified: Secondary | ICD-10-CM | POA: Diagnosis not present

## 2011-05-25 DIAGNOSIS — I495 Sick sinus syndrome: Secondary | ICD-10-CM | POA: Diagnosis not present

## 2011-05-25 DIAGNOSIS — I10 Essential (primary) hypertension: Secondary | ICD-10-CM | POA: Diagnosis present

## 2011-05-25 DIAGNOSIS — Z96659 Presence of unspecified artificial knee joint: Secondary | ICD-10-CM

## 2011-05-25 DIAGNOSIS — T45515A Adverse effect of anticoagulants, initial encounter: Secondary | ICD-10-CM

## 2011-05-25 DIAGNOSIS — I2510 Atherosclerotic heart disease of native coronary artery without angina pectoris: Secondary | ICD-10-CM | POA: Diagnosis present

## 2011-05-25 DIAGNOSIS — Z96649 Presence of unspecified artificial hip joint: Secondary | ICD-10-CM

## 2011-05-25 DIAGNOSIS — G4733 Obstructive sleep apnea (adult) (pediatric): Secondary | ICD-10-CM | POA: Diagnosis not present

## 2011-05-25 DIAGNOSIS — I519 Heart disease, unspecified: Secondary | ICD-10-CM

## 2011-05-25 DIAGNOSIS — I428 Other cardiomyopathies: Secondary | ICD-10-CM | POA: Diagnosis present

## 2011-05-25 DIAGNOSIS — I5023 Acute on chronic systolic (congestive) heart failure: Secondary | ICD-10-CM | POA: Diagnosis not present

## 2011-05-25 DIAGNOSIS — R0600 Dyspnea, unspecified: Secondary | ICD-10-CM | POA: Diagnosis present

## 2011-05-25 DIAGNOSIS — J811 Chronic pulmonary edema: Secondary | ICD-10-CM | POA: Diagnosis not present

## 2011-05-25 DIAGNOSIS — R0602 Shortness of breath: Secondary | ICD-10-CM | POA: Diagnosis not present

## 2011-05-25 DIAGNOSIS — Z7901 Long term (current) use of anticoagulants: Secondary | ICD-10-CM | POA: Diagnosis not present

## 2011-05-25 DIAGNOSIS — J90 Pleural effusion, not elsewhere classified: Secondary | ICD-10-CM | POA: Diagnosis not present

## 2011-05-25 HISTORY — DX: Shortness of breath: R06.02

## 2011-05-25 HISTORY — DX: Cardiac arrhythmia, unspecified: I49.9

## 2011-05-25 HISTORY — DX: Spinal stenosis, site unspecified: M48.00

## 2011-05-25 HISTORY — DX: Heart disease, unspecified: I51.9

## 2011-05-25 HISTORY — DX: Acute on chronic systolic (congestive) heart failure: I50.23

## 2011-05-25 HISTORY — DX: Heart failure, unspecified: I50.9

## 2011-05-25 HISTORY — DX: Pneumonia, unspecified organism: J18.9

## 2011-05-25 HISTORY — DX: Inflammatory disease of prostate, unspecified: N41.9

## 2011-05-25 HISTORY — DX: Unspecified atrial flutter: I48.92

## 2011-05-25 HISTORY — DX: Sleep apnea, unspecified: G47.30

## 2011-05-25 LAB — CBC
HCT: 41.4 % (ref 39.0–52.0)
Hemoglobin: 13.8 g/dL (ref 13.0–17.0)
MCH: 30.9 pg (ref 26.0–34.0)
MCHC: 33.3 g/dL (ref 30.0–36.0)
MCV: 92.8 fL (ref 78.0–100.0)
Platelets: 237 10*3/uL (ref 150–400)
RBC: 4.46 MIL/uL (ref 4.22–5.81)
RDW: 15.1 % (ref 11.5–15.5)
WBC: 10.1 10*3/uL (ref 4.0–10.5)

## 2011-05-25 LAB — DIFFERENTIAL
Basophils Absolute: 0 10*3/uL (ref 0.0–0.1)
Basophils Relative: 0 % (ref 0–1)
Eosinophils Absolute: 0.1 10*3/uL (ref 0.0–0.7)
Eosinophils Relative: 1 % (ref 0–5)
Lymphocytes Relative: 23 % (ref 12–46)
Lymphs Abs: 2.3 10*3/uL (ref 0.7–4.0)
Monocytes Absolute: 0.8 10*3/uL (ref 0.1–1.0)
Monocytes Relative: 8 % (ref 3–12)
Neutro Abs: 6.9 10*3/uL (ref 1.7–7.7)
Neutrophils Relative %: 69 % (ref 43–77)

## 2011-05-25 LAB — URINALYSIS, ROUTINE W REFLEX MICROSCOPIC
Bilirubin Urine: NEGATIVE
Glucose, UA: NEGATIVE mg/dL
Hgb urine dipstick: NEGATIVE
Ketones, ur: NEGATIVE mg/dL
Leukocytes, UA: NEGATIVE
Nitrite: NEGATIVE
Protein, ur: NEGATIVE mg/dL
Specific Gravity, Urine: 1.022 (ref 1.005–1.030)
Urobilinogen, UA: 0.2 mg/dL (ref 0.0–1.0)
pH: 7 (ref 5.0–8.0)

## 2011-05-25 LAB — COMPREHENSIVE METABOLIC PANEL
ALT: 10 U/L (ref 0–53)
AST: 14 U/L (ref 0–37)
Albumin: 3.6 g/dL (ref 3.5–5.2)
Alkaline Phosphatase: 86 U/L (ref 39–117)
BUN: 16 mg/dL (ref 6–23)
CO2: 29 mEq/L (ref 19–32)
Calcium: 9.7 mg/dL (ref 8.4–10.5)
Chloride: 102 mEq/L (ref 96–112)
Creatinine, Ser: 0.73 mg/dL (ref 0.50–1.35)
GFR calc Af Amer: >90 mL/min (ref >90)
GFR calc non Af Amer: 89 mL/min — ABNORMAL LOW (ref >90)
Glucose, Bld: 89 mg/dL (ref 70–99)
Potassium: 4.3 mEq/L (ref 3.5–5.1)
Sodium: 139 mEq/L (ref 135–145)
Total Bilirubin: 0.5 mg/dL (ref 0.3–1.2)
Total Protein: 6.5 g/dL (ref 6.0–8.3)

## 2011-05-25 LAB — HEMOGLOBIN A1C
Hgb A1c MFr Bld: 5.7 % — ABNORMAL HIGH (ref <5.7)
Mean Plasma Glucose: 117 mg/dL — ABNORMAL HIGH (ref <117)

## 2011-05-25 LAB — PROTIME-INR
INR: 1.62 — ABNORMAL HIGH (ref 0.00–1.49)
Prothrombin Time: 19.5 seconds — ABNORMAL HIGH (ref 11.6–15.2)

## 2011-05-25 LAB — APTT: aPTT: 34 seconds (ref 24–37)

## 2011-05-25 LAB — TSH: TSH: 1.554 u[IU]/mL (ref 0.350–4.500)

## 2011-05-25 LAB — MAGNESIUM: Magnesium: 2.1 mg/dL (ref 1.5–2.5)

## 2011-05-25 LAB — PHOSPHORUS: Phosphorus: 3.8 mg/dL (ref 2.3–4.6)

## 2011-05-25 LAB — PRO B NATRIURETIC PEPTIDE: Pro B Natriuretic peptide (BNP): 3846 pg/mL — ABNORMAL HIGH (ref 0–125)

## 2011-05-25 MED ORDER — SODIUM CHLORIDE 0.9 % IV SOLN
250.0000 mL | INTRAVENOUS | Status: DC | PRN
  Administered 2011-05-26: 500 mL via INTRAVENOUS

## 2011-05-25 MED ORDER — ASPIRIN 81 MG PO TBEC: 81.0000 mg | DELAYED_RELEASE_TABLET | Freq: Every day | ORAL | Status: DC

## 2011-05-25 MED ORDER — SODIUM CHLORIDE 0.9 % IJ SOLN: 3.0000 mL | Freq: Two times a day (BID) | INTRAMUSCULAR | Status: DC

## 2011-05-25 MED ORDER — EZETIMIBE 10 MG PO TABS
10.0000 mg | ORAL_TABLET | Freq: Every day | ORAL | Status: DC
  Administered 2011-05-25: 10 mg via ORAL
  Filled 2011-05-25 (×2): qty 1

## 2011-05-25 MED ORDER — DILTIAZEM HCL 100 MG IV SOLR
5.0000 mg/h | INTRAVENOUS | Status: DC
  Administered 2011-05-25: 5 mg/h via INTRAVENOUS
  Administered 2011-05-26: 10 mg/h via INTRAVENOUS
  Filled 2011-05-25 (×2): qty 100

## 2011-05-25 MED ORDER — ZOLPIDEM TARTRATE 5 MG PO TABS: 5.0000 mg | ORAL_TABLET | Freq: Every evening | ORAL | Status: DC | PRN

## 2011-05-25 MED ORDER — DILTIAZEM LOAD VIA INFUSION
10.0000 mg | Freq: Once | INTRAVENOUS | Status: AC
  Administered 2011-05-25: 10 mg via INTRAVENOUS
  Filled 2011-05-25: qty 10

## 2011-05-25 MED ORDER — RIVAROXABAN 10 MG PO TABS
20.0000 mg | ORAL_TABLET | Freq: Every day | ORAL | Status: DC
  Administered 2011-05-25: 20 mg via ORAL
  Filled 2011-05-25 (×2): qty 2

## 2011-05-25 MED ORDER — TRAMADOL HCL 50 MG PO TABS
50.0000 mg | ORAL_TABLET | Freq: Two times a day (BID) | ORAL | Status: DC
  Administered 2011-05-25 – 2011-05-26 (×2): 50 mg via ORAL
  Filled 2011-05-25 (×2): qty 1

## 2011-05-25 MED ORDER — SODIUM CHLORIDE 0.9 % IJ SOLN
3.0000 mL | Freq: Two times a day (BID) | INTRAMUSCULAR | Status: DC
  Administered 2011-05-25 – 2011-05-26 (×2): 3 mL via INTRAVENOUS

## 2011-05-25 MED ORDER — ONDANSETRON HCL 4 MG PO TABS: 4.0000 mg | ORAL_TABLET | Freq: Four times a day (QID) | ORAL | Status: DC | PRN

## 2011-05-25 MED ORDER — ACETAMINOPHEN 650 MG RE SUPP: 650.0000 mg | Freq: Four times a day (QID) | RECTAL | Status: DC | PRN

## 2011-05-25 MED ORDER — DOCUSATE SODIUM 100 MG PO CAPS
100.0000 mg | ORAL_CAPSULE | Freq: Every day | ORAL | Status: DC | PRN
  Filled 2011-05-25: qty 1

## 2011-05-25 MED ORDER — MELOXICAM 15 MG PO TABS
15.0000 mg | ORAL_TABLET | Freq: Every day | ORAL | Status: DC
  Administered 2011-05-26: 15 mg via ORAL
  Filled 2011-05-25 (×2): qty 1

## 2011-05-25 MED ORDER — ASPIRIN EC 81 MG PO TBEC
81.0000 mg | DELAYED_RELEASE_TABLET | Freq: Every day | ORAL | Status: DC
  Administered 2011-05-26: 81 mg via ORAL
  Filled 2011-05-25 (×2): qty 1

## 2011-05-25 MED ORDER — OFF THE BEAT BOOK
Freq: Once | Status: AC
  Administered 2011-05-25: 18:00:00
  Filled 2011-05-25: qty 1

## 2011-05-25 MED ORDER — ONDANSETRON HCL 4 MG/2ML IJ SOLN: 4.0000 mg | Freq: Four times a day (QID) | INTRAMUSCULAR | Status: DC | PRN

## 2011-05-25 MED ORDER — CALCIUM CARBONATE 600 MG PO TABS
600.0000 mg | ORAL_TABLET | Freq: Two times a day (BID) | ORAL | Status: DC
  Filled 2011-05-25 (×2): qty 1

## 2011-05-25 MED ORDER — ALUM & MAG HYDROXIDE-SIMETH 200-200-20 MG/5ML PO SUSP: 30.0000 mL | Freq: Four times a day (QID) | ORAL | Status: DC | PRN

## 2011-05-25 MED ORDER — SODIUM CHLORIDE 0.9 % IJ SOLN: 3.0000 mL | INTRAMUSCULAR | Status: DC | PRN

## 2011-05-25 MED ORDER — CALCIUM CARBONATE 1250 (500 CA) MG PO TABS
1.0000 | ORAL_TABLET | Freq: Two times a day (BID) | ORAL | Status: DC
  Filled 2011-05-25 (×4): qty 1

## 2011-05-25 MED ORDER — DILTIAZEM HCL 25 MG/5ML IV SOLN
10.0000 mg | Freq: Once | INTRAVENOUS | Status: AC
  Administered 2011-05-25: 10 mg via INTRAVENOUS
  Filled 2011-05-25: qty 5

## 2011-05-25 MED ORDER — OMEGA-3-ACID ETHYL ESTERS 1 G PO CAPS
2.0000 g | ORAL_CAPSULE | Freq: Every day | ORAL | Status: DC
  Administered 2011-05-25: 2 g via ORAL
  Filled 2011-05-25 (×2): qty 2

## 2011-05-25 MED ORDER — LEVALBUTEROL TARTRATE 45 MCG/ACT IN AERO
2.0000 | INHALATION_SPRAY | Freq: Four times a day (QID) | RESPIRATORY_TRACT | Status: DC | PRN
  Filled 2011-05-25: qty 15

## 2011-05-25 MED ORDER — FUROSEMIDE 10 MG/ML IJ SOLN
40.0000 mg | Freq: Once | INTRAMUSCULAR | Status: AC
  Administered 2011-05-25: 40 mg via INTRAVENOUS
  Filled 2011-05-25: qty 4

## 2011-05-25 MED ORDER — ACETAMINOPHEN 325 MG PO TABS: 650.0000 mg | ORAL_TABLET | Freq: Four times a day (QID) | ORAL | Status: DC | PRN

## 2011-05-25 MED ORDER — OMEGA-3 FATTY ACIDS 1000 MG PO CAPS: 2.0000 g | ORAL_CAPSULE | Freq: Every day | ORAL | Status: DC

## 2011-05-25 MED ORDER — ATENOLOL 25 MG PO TABS
25.0000 mg | ORAL_TABLET | Freq: Two times a day (BID) | ORAL | Status: DC
  Administered 2011-05-25 – 2011-05-26 (×2): 25 mg via ORAL
  Filled 2011-05-25 (×3): qty 1

## 2011-05-25 MED ORDER — RIVAROXABAN 20 MG PO TABS: 20.0000 mg | ORAL_TABLET | Freq: Every day | ORAL | Status: DC

## 2011-05-25 NOTE — H&P (Signed)
History and physical addendum   77 year old white married father of 2 with 4 grandchildren presents for elective admission today secondary to shortness of breath secondary to a flutter with rapid ventricular response.  Please see Dr. Kandis Cocking history and physical from 05/24/2011 for complete details. This is in his shadow chart.  Patient has a history of coronary disease including bare-metal stenting to the PLA of the right coronary artery in 2001 other disease does include 70% mid LAD 50-60% proximal circumflex and 60% PDA lesion he also had normal renal arteries at that time. His last nuclear Myoview was 04/21/2010 which was felt to be low-risk with subtle inferior ischemia.  His last TD at term in atrial flutter was 04/29/2011 with a EF of 40-45% and no significant valve disease.  He was seen by Dr. Allyson Sabal in January and found to be in a flutter he was placed on Xarelto with plans for cardioversion. Unfortunately he developed increasing shortness of breath and his heart rate continues to be 120 and at times 140.  He was seen by Dr. Lenord Fellers and then Dr. Alanda Amass yesterday and due to his increasing dyspnea and blood pressures of 150-160/80-90 there was recommended for admission, continue Zaroxolyn, IV Cardizem to control the rate or perhaps amiodarone.  Patient is scheduled for a TEE cardioversion 05/26/2011 at 0900.  Dr. Alanda Amass thought was that he did not convert he would be considered for anti-arrhythmic possibility or a flutter ablation.  Other history includes obstructive sleep apnea but unable to tolerate CPAP, bilateral hip replacements, bilateral knee replacements. He has hypertension. Again please see Dr. Kandis Cocking history and physical from 05/24/2011.  Please note on admission his BNP is elevated we will diurese. Also CXR with Mild CHF.   BP 137/98 P 116 R 18 T 98  spo2 94% General: A&O, wm, DOE, no chest pain pleasant affect Skin:W7D, brisk capillary refill HEENT:normocephalic,  sclera clear Neck:supple no bruits, thick neck Heart:S1S2 irreg, irreg Lungs:few crackles, diminished breath sounds RUE:AVWUJ, soft, non tender Ext:tr to 1+ edema Neuro:A&O X 3 MAE, follows commands.  Discussed with pt. And his family-  They would like to switch from Xarelto to Coumadin.  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  They would prefer coumadin so that if trauma occurs the anticoagulant could be reversed.   I have seen and examined the patient along with Nada Boozer, NP.  I have reviewed the chart, notes and new data.  I agree with Laura's note.  Key new complaints: dyspnea on exertion Key examination changes: no rales, tachy in 110s, minimal edema Key new findings / data: labs pending; Tele with rates 80-110s - AFlutter  PLAN: As per Dr. Kandis Cocking note - IV diltiazem for rate control.  Continue Xarelto - plan DCCV in AM.  He has been on Xarelto since 04/22/11 - & therefore would not require TEE guided DCCV. 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  If DCCV successful, would consider OP EP consult to discuss ablation.   I suspect as rate/rhythm controlled, his symptoms will improve & will not need significant diuresis.     Marykay Lex, M.D., M.S. [facility] [address] [place], [place]  [address]  [phone]  05/25/2011 5:40 PM

## 2011-05-26 ENCOUNTER — Other Ambulatory Visit: Payer: Self-pay

## 2011-05-26 ENCOUNTER — Inpatient Hospital Stay (HOSPITAL_COMMUNITY): Payer: Medicare Other | Admitting: Anesthesiology

## 2011-05-26 ENCOUNTER — Inpatient Hospital Stay (HOSPITAL_COMMUNITY): Admission: AD | Admit: 2011-05-26 | Payer: Medicare Other | Source: Ambulatory Visit | Admitting: Cardiovascular Disease

## 2011-05-26 ENCOUNTER — Encounter (HOSPITAL_COMMUNITY): Payer: Self-pay | Admitting: Anesthesiology

## 2011-05-26 ENCOUNTER — Encounter (HOSPITAL_COMMUNITY): Payer: Self-pay | Admitting: Cardiology

## 2011-05-26 ENCOUNTER — Encounter (HOSPITAL_COMMUNITY)
Admission: AD | Disposition: A | Discharge status: Home / Self Care | Payer: Self-pay | Source: Ambulatory Visit | Attending: Cardiovascular Disease

## 2011-05-26 DIAGNOSIS — I5023 Acute on chronic systolic (congestive) heart failure: Secondary | ICD-10-CM | POA: Diagnosis not present

## 2011-05-26 DIAGNOSIS — G4733 Obstructive sleep apnea (adult) (pediatric): Secondary | ICD-10-CM | POA: Diagnosis not present

## 2011-05-26 DIAGNOSIS — I4892 Unspecified atrial flutter: Secondary | ICD-10-CM | POA: Diagnosis not present

## 2011-05-26 DIAGNOSIS — I10 Essential (primary) hypertension: Secondary | ICD-10-CM | POA: Diagnosis not present

## 2011-05-26 DIAGNOSIS — E669 Obesity, unspecified: Secondary | ICD-10-CM | POA: Diagnosis not present

## 2011-05-26 DIAGNOSIS — I2510 Atherosclerotic heart disease of native coronary artery without angina pectoris: Secondary | ICD-10-CM | POA: Diagnosis not present

## 2011-05-26 HISTORY — PX: CARDIOVERSION: SHX1299

## 2011-05-26 HISTORY — PX: TEE WITHOUT CARDIOVERSION: SHX5443

## 2011-05-26 HISTORY — DX: Acute on chronic systolic (congestive) heart failure: I50.23

## 2011-05-26 LAB — CBC
HCT: 40.8 % (ref 39.0–52.0)
Hemoglobin: 13.1 g/dL (ref 13.0–17.0)
MCH: 30.4 pg (ref 26.0–34.0)
MCHC: 32.1 g/dL (ref 30.0–36.0)
MCV: 94.7 fL (ref 78.0–100.0)
Platelets: 231 10*3/uL (ref 150–400)
RBC: 4.31 MIL/uL (ref 4.22–5.81)
RDW: 15.4 % (ref 11.5–15.5)
WBC: 9.4 10*3/uL (ref 4.0–10.5)

## 2011-05-26 LAB — BASIC METABOLIC PANEL
BUN: 19 mg/dL (ref 6–23)
CO2: 27 mEq/L (ref 19–32)
Calcium: 9 mg/dL (ref 8.4–10.5)
Chloride: 99 mEq/L (ref 96–112)
Creatinine, Ser: 0.74 mg/dL (ref 0.50–1.35)
GFR calc Af Amer: >90 mL/min (ref >90)
GFR calc non Af Amer: 89 mL/min — ABNORMAL LOW (ref >90)
Glucose, Bld: 103 mg/dL — ABNORMAL HIGH (ref 70–99)
Potassium: 5.3 mEq/L — ABNORMAL HIGH (ref 3.5–5.1)
Sodium: 136 mEq/L (ref 135–145)

## 2011-05-26 SURGERY — CARDIOVERSION
Anesthesia: Monitor Anesthesia Care

## 2011-05-26 SURGERY — ECHOCARDIOGRAM, TRANSESOPHAGEAL
Anesthesia: General

## 2011-05-26 MED ORDER — MIDAZOLAM HCL 10 MG/2ML IJ SOLN
INTRAMUSCULAR | Status: DC | PRN
  Administered 2011-05-26: 2 mg via INTRAVENOUS
  Administered 2011-05-26: 1 mg via INTRAVENOUS

## 2011-05-26 MED ORDER — FUROSEMIDE 40 MG PO TABS: ORAL_TABLET | ORAL | Status: DC

## 2011-05-26 MED ORDER — FENTANYL CITRATE 0.05 MG/ML IJ SOLN
INTRAMUSCULAR | Status: AC
  Filled 2011-05-26: qty 2

## 2011-05-26 MED ORDER — MIDAZOLAM HCL 10 MG/2ML IJ SOLN
INTRAMUSCULAR | Status: AC
  Filled 2011-05-26: qty 2

## 2011-05-26 MED ORDER — SODIUM CHLORIDE 0.9 % IV SOLN
INTRAVENOUS | Status: DC | PRN
  Administered 2011-05-26: 09:00:00 via INTRAVENOUS

## 2011-05-26 MED ORDER — LIDOCAINE HCL (CARDIAC) 20 MG/ML IV SOLN
INTRAVENOUS | Status: DC | PRN
  Administered 2011-05-26: 20 mg via INTRAVENOUS

## 2011-05-26 MED ORDER — FUROSEMIDE 40 MG PO TABS: 40.0000 mg | ORAL_TABLET | Freq: Every day | ORAL | Status: DC

## 2011-05-26 MED ORDER — BUTAMBEN-TETRACAINE-BENZOCAINE 2-2-14 % EX AERO
INHALATION_SPRAY | CUTANEOUS | Status: DC | PRN
  Administered 2011-05-26: 2 via TOPICAL

## 2011-05-26 MED ORDER — PROPOFOL 10 MG/ML IV BOLUS
INTRAVENOUS | Status: DC | PRN
  Administered 2011-05-26: 80 mg via INTRAVENOUS

## 2011-05-26 MED ORDER — FENTANYL CITRATE 0.05 MG/ML IJ SOLN
INTRAMUSCULAR | Status: DC | PRN
  Administered 2011-05-26: 25 ug via INTRAVENOUS

## 2011-05-26 MED ORDER — LEVALBUTEROL TARTRATE 45 MCG/ACT IN AERO: 2.0000 | INHALATION_SPRAY | Freq: Four times a day (QID) | RESPIRATORY_TRACT | Status: DC | PRN

## 2011-05-26 NOTE — Progress Notes (Signed)
pts SBP in the 90s manually; pt did receive po atenolol earlier today, BP 104/65 . Pt states he does not get dizzy or lightheaded when he's up moving around;NP made aware, will continue to monitor

## 2011-05-26 NOTE — Op Note (Signed)
INDICATIONS: Atrial flutter, symptomatic  PROCEDURE:   Informed consent was obtained prior to the procedure. The risks, benefits and alternatives for the procedure were discussed and the patient comprehended these risks.  Risks include, but are not limited to, cough, sore throat, vomiting, nausea, somnolence, esophageal and stomach trauma or perforation, bleeding, low blood pressure, aspiration, pneumonia, infection, trauma to the teeth and death.    After a procedural time-out, the oropharynx was anesthetized with 20% benzocaine spray. The patient was given 3 mg versed and 25 mcg fentanyl for moderate sedation.   The transesophageal probe was inserted in the esophagus and stomach without difficulty and multiple views were obtained.  The patient was kept under observation until the patient left the procedure room.  The patient left the procedure room in stable condition.   Agitated microbubble saline contrast was not administered.  COMPLICATIONS:    There were no immediate complications.  FINDINGS:  No LA thrombus Moderately depressed LVEF, 40% Moderately dilated and severely depressed RV Moderately dilated RA Unable to estimate PA pressure (no TR) Mild to moderate mitral insufficiency   RECOMMENDATIONS:   Proceed with cardioversion  Time Spent Directly with the Patient:  45 minutes   Celise Bazar 05/26/2011, 9:18 AM    Procedure: Electrical Cardioversion Indications:  Atrial Flutter  Procedure Details:  Consent: Risks of procedure as well as the alternatives and risks of each were explained to the (patient/caregiver).  Consent for procedure obtained.  Time Out: Verified patient identification, verified procedure, site/side was marked, verified correct patient position, special equipment/implants available, medications/allergies/relevent history reviewed, required imaging and test results available.  Performed  Patient placed on cardiac monitor, pulse oximetry,  supplemental oxygen as necessary.  Sedation given: via Anesthesiology services - Dr. Lodema Hong, propofol 80 mg IV. Pacer pads placed anterior and posterior chest.  Cardioverted 1 time(s).  Cardioverted at 150J.  Evaluation: Findings: Post procedure EKG shows: NSR.  Post cardioversion 2.5-3.0 second pause. Complications: None Patient did tolerate procedure well.  Time Spent Directly with the Patient:  30 minutes   Treyvion Durkee 05/26/2011, 9:18 AM

## 2011-05-26 NOTE — Anesthesia Preprocedure Evaluation (Addendum)
Anesthesia Evaluation  Patient identified by MRN, date of birth, ID band Patient awake    Reviewed: Allergy & Precautions, H&P , NPO status   History of Anesthesia Complications (+) DIFFICULT AIRWAYNegative for: history of anesthetic complications  Airway Mallampati: III      Dental  (+) Poor Dentition   Pulmonary shortness of breath and with exertion, asthma , sleep apnea and Continuous Positive Airway Pressure Ventilation , pneumonia ,  clear to auscultation        Cardiovascular hypertension, Pt. on home beta blockers + CAD and +CHF + dysrhythmias Atrial Fibrillation Irregular Tachycardia EF 40%; RV dilitation   Neuro/Psych    GI/Hepatic   Endo/Other    Renal/GU      Musculoskeletal   Abdominal (+) obese,   Peds  Hematology   Anesthesia Other Findings   Reproductive/Obstetrics                         Anesthesia Physical Anesthesia Plan  ASA: III  Anesthesia Plan: General   Post-op Pain Management:    Induction:   Airway Management Planned:   Additional Equipment:   Intra-op Plan:   Post-operative Plan:   Informed Consent: I have reviewed the patients History and Physical, chart, labs and discussed the procedure including the risks, benefits and alternatives for the proposed anesthesia with the patient or authorized representative who has indicated his/her understanding and acceptance.     Plan Discussed with: CRNA and Surgeon  Anesthesia Plan Comments:         Anesthesia Quick Evaluation

## 2011-05-26 NOTE — Clinical Documentation Improvement (Signed)
CHF DOCUMENTATION CLARIFICATION QUERY  THIS DOCUMENT IS NOT A PERMANENT PART OF THE MEDICAL RECORD  TO RESPOND TO THE THIS QUERY, FOLLOW THE INSTRUCTIONS BELOW:  1. If needed, update documentation for the patient's encounter via the notes activity.  2. Access this query again and click edit on the In [employer].  3. After updating, or not, click F2 to complete all highlighted (required) fields concerning your review. Select "additional documentation in the medical record" OR "no additional documentation provided".  4. Click Sign note button.  5. The deficiency will fall out of your In Basket *Please let us know if you are not able to complete this workflow by phone or e-mail (listed below).  Please update your documentation within the medical record to reflect your response to this query.                                                                                    05/26/11  Dear Dr. Her[facility],  In a better effort to capture your patient's severity of illness, reflect appropriate length of stay and utilization of resources, a review of the patient medical record has revealed the following indicators the diagnosis of Heart Failure.    In responding to this query please exercise your independent judgment.  The fact that a query is asked, does not imply that any particular answer is desired or expected. THANK YOU FOR RESPONDING WiTH TYPE OF CHF.  Possible Clinical Conditions? - Acute Systolic Congestive Heart Failure - Acute Diastolic Congestive Heart Failure - Acute Systolic & Diastolic Congestive Heart Failure - Acute on Chronic Systolic Congestive Heart Failure - Acute on Chronic Diastolic Congestive Heart Failure - Acute on Chronic Systolic & Diastolic  Congestive Heart Failure - Other Condition (please specify) - Cannot Clinically Determine  Supporting Information: - From H&P: "...on admission his BNP is elevated we will diurese. Also CXR with Mild CHF.",  Lungs:few crackles, diminished breath sounds, Per Cards 2/6: "CHF, acute, mild secondary to EF 40-45% an a. flutter with RVR."  Reviewed: additional documentation in the medical record  Thank You,  Beverley Fiedler RN Clinical Documentation Specialist: CELL:  [phone]  Health Information Management [place]

## 2011-05-26 NOTE — Preoperative (Signed)
Beta Blockers   Reason not to administer Beta Blockers:Not Applicable 

## 2011-05-26 NOTE — Anesthesia Postprocedure Evaluation (Signed)
  Anesthesia Post-op Note  Patient: Mitchell [name]  Procedure(s) Performed:  TRANSESOPHAGEAL ECHOCARDIOGRAM (TEE); CARDIOVERSION  Patient Location: PACU  Anesthesia Type: General  Level of Consciousness: awake  Airway and Oxygen Therapy: Patient Spontanous Breathing and Patient connected to nasal cannula oxygen  Post-op Pain: none  Post-op Assessment: Post-op Vital signs reviewed and Patient's Cardiovascular Status Stable  Post-op Vital Signs: stable  Complications: No apparent anesthesia complications

## 2011-05-26 NOTE — Transfer of Care (Signed)
Immediate Anesthesia Transfer of Care Note  Patient: Mitchell [name]  Procedure(s) Performed:  TRANSESOPHAGEAL ECHOCARDIOGRAM (TEE); CARDIOVERSION  Patient Location: PACU  Anesthesia Type: General  Level of Consciousness: awake  Airway & Oxygen Therapy: Patient Spontanous Breathing and Patient connected to face mask oxygen  Post-op Assessment: Post -op Vital signs reviewed and stable and report to endoscopy RN  Post vital signs: stable  Complications: No apparent anesthesia complications

## 2011-05-26 NOTE — Progress Notes (Signed)
[facility]  DAILY PROGRESS NOTE   Subjective:  Tired, no frank dyspnea at rest, no chest pain or palpitations.  Objective:  Temp:  [98 F (36.7 C)-98.2 F (36.8 C)] 98 F (36.7 C) (02/06 0453) Pulse Rate:  [76-116] 96  (02/06 0453) Resp:  [18] 18  (02/06 0453) BP: (109-147)/(62-111) 116/71 mmHg (02/06 0453) SpO2:  [90 %-94 %] 91 % (02/06 0453) Weight:  [138.801 kg (306 lb)-139.25 kg (306 lb 15.9 oz)] 139.25 kg (306 lb 15.9 oz) (02/06 0500) Weight change:   Intake/Output from previous day: 02/05 0701 - 02/06 0700 In: 483 [P.O.:480; I.V.:3] Out: 2100 [Urine:2100]  Intake/Output from this shift:    Medications: Current Facility-Administered Medications  Medication Dose Route Frequency Provider Last Rate Last Dose  . 0.9 %  sodium chloride infusion  250 mL Intravenous PRN Leone Brand, NP      . acetaminophen (TYLENOL) tablet 650 mg  650 mg Oral Q6H PRN Leone Brand, NP      . alum & mag hydroxide-simeth (MAALOX/MYLANTA) 200-200-20 MG/5ML suspension 30 mL  30 mL Oral Q6H PRN Leone Brand, NP      . aspirin EC tablet 81 mg  81 mg Oral Daily Governor Rooks, MD      . atenolol (TENORMIN) tablet 25 mg  25 mg Oral BID Leone Brand, NP   25 mg at 05/25/11 2205  . calcium carbonate (OS-CAL - dosed in mg of elemental calcium) tablet 500 mg of elemental calcium  1 tablet Oral BID WC Governor Rooks, MD      . diltiazem (CARDIZEM) 1 mg/mL load via infusion 10 mg  10 mg Intravenous Once Leone Brand, NP   10 mg at 05/25/11 1621   And  . diltiazem (CARDIZEM) 100 mg in dextrose 5 % 100 mL infusion  5-10 mg/hr Intravenous Continuous Leone Brand, NP 10 mL/hr at 05/26/11 0444 10 mg/hr at 05/26/11 0444  . diltiazem (CARDIZEM) injection 10 mg  10 mg Intravenous Once Marykay Lex, MD   10 mg at 05/25/11 1754  . docusate sodium (COLACE) capsule 100 mg  100 mg Oral Daily PRN Leone Brand, NP      . ezetimibe (ZETIA) tablet 10 mg  10 mg Oral  Daily Leone Brand, NP   10 mg at 05/25/11 1740  . furosemide (LASIX) injection 40 mg  40 mg Intravenous Once Leone Brand, NP   40 mg at 05/25/11 1740  . levalbuterol [facility] HFA) inhaler 2 puff  2 puff Inhalation Q6H PRN Leone Brand, NP      . meloxicam [facility]) tablet 15 mg  15 mg Oral Q breakfast Leone Brand, NP      . off the beat book   Does not apply Once Governor Rooks, MD      . omega-3 acid ethyl esters (LOVAZA) capsule 2 g  2 g Oral Daily Governor Rooks, MD   2 g at 05/25/11 1740  . ondansetron (ZOFRAN) tablet 4 mg  4 mg Oral Q6H PRN Leone Brand, NP       Or  . ondansetron [facility]) injection 4 mg  4 mg Intravenous Q6H PRN Leone Brand, NP      . rivaroxaban Carlena Hurl) tablet 20 mg  20 mg Oral Q supper Governor Rooks, MD   20 mg at 05/25/11 1740  . sodium chloride 0.9 % injection 3 mL  3  mL Intravenous Q12H Leone Brand, NP   3 mL at 05/25/11 1625  . sodium chloride 0.9 % injection 3 mL  3 mL Intravenous Q12H Leone Brand, NP      . sodium chloride 0.9 % injection 3 mL  3 mL Intravenous PRN Leone Brand, NP      . traMADol Janean Sark) tablet 50 mg  50 mg Oral BID Leone Brand, NP   50 mg at 05/25/11 2205  . zolpidem (AMBIEN) tablet 5 mg  5 mg Oral QHS PRN Leone Brand, NP      . DISCONTD: acetaminophen (TYLENOL) suppository 650 mg  650 mg Rectal Q6H PRN Leone Brand, NP      . DISCONTD: aspirin EC tablet 81 mg  81 mg Oral Daily Leone Brand, NP      . DISCONTD: calcium carbonate (OS-CAL) tablet 600 mg  600 mg Oral BID WC Leone Brand, NP      . DISCONTD: fish oil-omega-3 fatty acids capsule 2 g  2 g Oral Daily Leone Brand, NP      . DISCONTD: Rivaroxaban TABS 20 mg  20 mg Oral Daily Leone Brand, NP        Physical Exam: General appearance: alert, cooperative and no distress Neck: no adenopathy, no carotid bruit, supple, symmetrical, trachea midline, thyroid not enlarged, symmetric, no tenderness/mass/nodules and Cannot see JVP due to  obesity Lungs: clear to auscultation bilaterally Heart: irregularly irregular rhythm, S2: increased P2, wide splitting, systolic murmur: early systolic 1/6, crescendo and decrescendo at 2nd left intercostal space and no click Abdomen: very obese, no obvious abnormalities Extremities: extremities normal, atraumatic, no cyanosis or edema Skin: Skin color, texture, turgor normal. No rashes or lesions Neurologic: Alert and oriented X 3, normal strength and tone. Normal symmetric reflexes. Normal coordination and gait  Lab Results: Results for orders placed during the hospital encounter of 05/25/11 (from the past 48 hour(s))  APTT     Status: Normal   Collection Time   05/25/11  1:14 PM      Component Value Range Comment   aPTT 34  24 - 37 (seconds)   COMPREHENSIVE METABOLIC PANEL     Status: Abnormal   Collection Time   05/25/11  1:14 PM      Component Value Range Comment   Sodium 139  135 - 145 (mEq/L)    Potassium 4.3  3.5 - 5.1 (mEq/L)    Chloride 102  96 - 112 (mEq/L)    CO2 29  19 - 32 (mEq/L)    Glucose, Bld 89  70 - 99 (mg/dL)    BUN 16  6 - 23 (mg/dL)    Creatinine, Ser 7.82  0.50 - 1.35 (mg/dL)    Calcium 9.7  8.4 - 10.5 (mg/dL)    Total Protein 6.5  6.0 - 8.3 (g/dL)    Albumin 3.6  3.5 - 5.2 (g/dL)    AST 14  0 - 37 (U/L)    ALT 10  0 - 53 (U/L)    Alkaline Phosphatase 86  39 - 117 (U/L)    Total Bilirubin 0.5  0.3 - 1.2 (mg/dL)    GFR calc non Af Amer 89 (*) >90 (mL/min)    GFR calc Af Amer >90  >90 (mL/min)   PROTIME-INR     Status: Abnormal   Collection Time   05/25/11  1:14 PM      Component Value Range Comment   Prothrombin  Time 19.5 (*) 11.6 - 15.2 (seconds)    INR 1.62 (*) 0.00 - 1.49    MAGNESIUM     Status: Normal   Collection Time   05/25/11  1:14 PM      Component Value Range Comment   Magnesium 2.1  1.5 - 2.5 (mg/dL)   TSH     Status: Normal   Collection Time   05/25/11  1:14 PM      Component Value Range Comment   TSH 1.554  0.350 - 4.500 (uIU/mL)   CBC      Status: Normal   Collection Time   05/25/11  1:14 PM      Component Value Range Comment   WBC 10.1  4.0 - 10.5 (K/uL)    RBC 4.46  4.22 - 5.81 (MIL/uL)    Hemoglobin 13.8  13.0 - 17.0 (g/dL)    HCT 16.1  09.6 - 04.5 (%)    MCV 92.8  78.0 - 100.0 (fL)    MCH 30.9  26.0 - 34.0 (pg)    MCHC 33.3  30.0 - 36.0 (g/dL)    RDW 40.9  81.1 - 91.4 (%)    Platelets 237  150 - 400 (K/uL)   DIFFERENTIAL     Status: Normal   Collection Time   05/25/11  1:14 PM      Component Value Range Comment   Neutrophils Relative 69  43 - 77 (%)    Neutro Abs 6.9  1.7 - 7.7 (K/uL)    Lymphocytes Relative 23  12 - 46 (%)    Lymphs Abs 2.3  0.7 - 4.0 (K/uL)    Monocytes Relative 8  3 - 12 (%)    Monocytes Absolute 0.8  0.1 - 1.0 (K/uL)    Eosinophils Relative 1  0 - 5 (%)    Eosinophils Absolute 0.1  0.0 - 0.7 (K/uL)    Basophils Relative 0  0 - 1 (%)    Basophils Absolute 0.0  0.0 - 0.1 (K/uL)   PHOSPHORUS     Status: Normal   Collection Time   05/25/11  1:14 PM      Component Value Range Comment   Phosphorus 3.8  2.3 - 4.6 (mg/dL)   HEMOGLOBIN N8G     Status: Abnormal   Collection Time   05/25/11  1:14 PM      Component Value Range Comment   Hemoglobin A1C 5.7 (*) <5.7 (%)    Mean Plasma Glucose 117 (*) <117 (mg/dL)   PRO B NATRIURETIC PEPTIDE     Status: Abnormal   Collection Time   05/25/11  1:14 PM      Component Value Range Comment   Pro B Natriuretic peptide (BNP) 3846.0 (*) 0 - 125 (pg/mL)   URINALYSIS, ROUTINE W REFLEX MICROSCOPIC     Status: Normal   Collection Time   05/25/11  2:02 PM      Component Value Range Comment   Color, Urine YELLOW  YELLOW     APPearance CLEAR  CLEAR     Specific Gravity, Urine 1.022  1.005 - 1.030     pH 7.0  5.0 - 8.0     Glucose, UA NEGATIVE  NEGATIVE (mg/dL)    Hgb urine dipstick NEGATIVE  NEGATIVE     Bilirubin Urine NEGATIVE  NEGATIVE     Ketones, ur NEGATIVE  NEGATIVE (mg/dL)    Protein, ur NEGATIVE  NEGATIVE (mg/dL)    Urobilinogen, UA 0.2  0.0 - 1.0 (  mg/dL)     Nitrite NEGATIVE  NEGATIVE     Leukocytes, UA NEGATIVE  NEGATIVE  MICROSCOPIC NOT DONE ON URINES WITH NEGATIVE PROTEIN, BLOOD, LEUKOCYTES, NITRITE, OR GLUCOSE <1000 mg/dL.  BASIC METABOLIC PANEL     Status: Abnormal   Collection Time   05/26/11  5:23 AM      Component Value Range Comment   Sodium 136  135 - 145 (mEq/L)    Potassium 5.3 (*) 3.5 - 5.1 (mEq/L) HEMOLYSIS AT THIS LEVEL MAY AFFECT RESULT   Chloride 99  96 - 112 (mEq/L)    CO2 27  19 - 32 (mEq/L)    Glucose, Bld 103 (*) 70 - 99 (mg/dL)    BUN 19  6 - 23 (mg/dL)    Creatinine, Ser 1.61  0.50 - 1.35 (mg/dL)    Calcium 9.0  8.4 - 10.5 (mg/dL)    GFR calc non Af Amer 89 (*) >90 (mL/min)    GFR calc Af Amer >90  >90 (mL/min)   CBC     Status: Normal   Collection Time   05/26/11  5:23 AM      Component Value Range Comment   WBC 9.4  4.0 - 10.5 (K/uL)    RBC 4.31  4.22 - 5.81 (MIL/uL)    Hemoglobin 13.1  13.0 - 17.0 (g/dL)    HCT 09.6  04.5 - 40.9 (%)    MCV 94.7  78.0 - 100.0 (fL)    MCH 30.4  26.0 - 34.0 (pg)    MCHC 32.1  30.0 - 36.0 (g/dL)    RDW 81.1  91.4 - 78.2 (%)    Platelets 231  150 - 400 (K/uL)     Imaging: Dg Chest 2 View  05/25/2011  *RADIOLOGY REPORT*  Clinical Data: Short of breath and arrhythmia  CHEST - 2 VIEW  Comparison: None.  Findings: Mild edema.  Small bilateral pleural effusions. Cardiomegaly.  No pneumothorax.  IMPRESSION: Mild CHF.  Original Report Authenticated By: Donavan Burnet, M.D.    Assessment:  1. Principal Problem: 2.  *Atrial flutter with rapid ventricular response 3. Active Problems: 4.  HTN (hypertension), elevated on admit 5.  Coronary disease,Bare metal stent of PLA of RCA 2001, 70% mid LAD, 50-60% prox. LCX, 60% PDA 6.  SOB (shortness of breath), secondary to SOB 7.  Anticoagulation adequate, xarelto 8.  LV dysfunction, EF by ECHO 40-45% 04/29/11 9.  CHF, acute, mild secondary to EF 40-45% an a. flutter with RVR 10. Possible tachycardia related cardiomyopathy 11. Obstructive  sleep apnea, intolerant to CPAP  Plan:  1. Risks and benefits of TEE and cardioversion and sedation discussed in detail with patient and wife. They agree to proceed. If he has early arrhythmia recurrence, favor ablation over antiarrhythmic drugs, especially with untreated OSA/likely cor pulmonale.  Would delay this for 30 days to avoid prothrombotic risk with early Xarelto interruption. Would delay transition from Xarelto to warfarin (desired by patient) for 30 days for the same reason.  If successful and uncomplicated procedure, may be ready for dc later today. Recommend repeat limited TTE to reassess LVEF roughly 2-3 months after restoration of NSR to see if there is resolution of cardiomyopathy.  Time Spent Directly with Patient:  45 minutes  Length of Stay:  LOS: 1 day    Mitchell [name] 05/26/2011, 8:08 AM

## 2011-05-26 NOTE — Procedures (Signed)
Documentation for cardioversion with VS on anesthesia record.  80 mg IV diprovan given by Dr Jacklynn Bue.  Cardioverted with 150 joules biphasic to sinus bradycardia.  Iv cardizem discontinued

## 2011-05-26 NOTE — Discharge Summary (Addendum)
Physician Discharge Summary  Patient ID: Mitchell [name] MRN: [id number] DOB/AGE: 11-20-1934 77 y.o.  Admit date: 05/25/2011 Discharge date: 05/26/2011  Discharge Diagnoses:  Principal Problem:  *Atrial flutter with rapid ventricular response Active Problems:  SOB (shortness of breath), secondary to SOB  CHF, acute, mild secondary to EF 40-45% an a. flutter with RVR  HTN (hypertension), elevated on admit  Coronary disease,Bare metal stent of PLA of RCA 2001, 70% mid LAD, 50-60% prox. LCX, 60% PDA  Anticoagulation adequate, xarelto  LV dysfunction, EF by ECHO 40-45% 04/29/11  Acute on chronic systolic heart failure, usually asymptomatic but with tachycardia HF became acute now resolved   Discharged Condition: good  Hospital Course: 77 year old white married father of 2 with 4 grandchildren presents for elective admission 05/25/11 secondary to shortness of breath secondary to a flutter with rapid ventricular response.   Patient has a history of coronary disease including bare-metal stenting to the PLA of the right coronary artery in 2001 other disease does include 70% mid LAD 50-60% proximal circumflex and 60% PDA lesion he also had normal renal arteries at that time. His last nuclear Myoview was 04/21/2010 which was felt to be low-risk with subtle inferior ischemia.   His last 2DEcho in atrial flutter was 04/29/2011 with a EF of 40-45% and no significant valve disease. He was seen by Dr. Allyson Sabal in January and found to be in a flutter he was placed on Xarelto with plans for cardioversion. Unfortunately he developed increasing shortness of breath and his heart rate continues to be 120 and at times 140. He was seen by Dr. Lenord Fellers and then Dr. Alanda Amass 05/24/11 and due to his increasing dyspnea and blood pressures of 150-160/80-90 he was recommended for admission, continue Xarelto, IV Cardizem to control the rate or perhaps amiodarone.  Patient was scheduled for a TEE cardioversion 05/26/2011 at 0900.  Dr. Alanda Amass thought was that if he did not convert he would be considered for anti-arrhythmic possibility or a flutter ablation.  Today patient underwent TEE and then cardioversion.  On TEE there was no LA thrombus his EF was 40% moderately dilated and severely depressed right ventricle moderately dilated right atrium and mild to moderate mitral insufficiency.  Patient was then cardioverted into sinus rhythm without complications.  Once he has recovered and can ambulate without problems he will be discharged home.  Vital signs are stable. 2 to the RV heart failure and has mild systolic heart failure of the left ventricle will add Lasix 40 mg daily. I have not currently added potassium secondary to elevated potassium after IV Lasix yesterday. ACE inhibitor is not yet added secondary to borderline blood pressure and elevated potassium.  Plan will be to add as an outpatient.   Please note, pt. Would like to be changed to coumadin, but this is not recommended for at least 30 days after DCCV secondary to increase risk of stroke.   Also we changed his albuterol to Xopenex inhaler for his history of asthma to help prevent tachycardia Consults: None  Significant Diagnostic Studies:  Labs on admission sodium 139 potassium 4.3 chloride 102 CO2 29 BUN 18 creatinine 0.73 calcium 9.7 phosphorus 3.8 magnesium 2.1 alkaline phosphatase 86 albumin 3.6 AST 14 ALT 10  ProBNP 3846  Potassium at discharge was 5.3. Hemoglobin 13.1 hematocrit 40.8 CBC 9.4 platelets 231  UA was clear TSH 1.5 x 4 hemoglobin A1c was 5.7  Two-view chest x-ray:Findings: Mild edema. Small bilateral pleural effusions.  Cardiomegaly. No pneumothorax.  IMPRESSION:  Mild  CHF.   Discharge Exam: Blood pressure 96/60, pulse 69, temperature 98.2 F (36.8 C), temperature source Oral, resp. rate 19, height 6\' 2"  (1.88 m), weight 139.25 kg (306 lb 15.9 oz), SpO2 90.00%.  sinus rhythm   Disposition: Home   Medication List  As of  05/26/2011  3:56 PM   STOP taking these medications         VENTOLIN HFA 108 (90 BASE) MCG/ACT inhaler         TAKE these medications         aspirin 81 MG EC tablet   Take 81 mg by mouth daily.      atenolol 25 MG tablet   Commonly known as: TENORMIN   Take 25 mg by mouth 2 (two) times daily.      calcium carbonate 600 MG Tabs   Commonly known as: OS-CAL   Take 600 mg by mouth 2 (two) times daily with a meal.      ezetimibe 10 MG tablet   Commonly known as: ZETIA   Take 10 mg by mouth daily.      fish oil-omega-3 fatty acids 1000 MG capsule   Take 2 g by mouth daily.      furosemide 40 MG tablet   Commonly known as: LASIX   40 mg. Tablet, only take half a tablet every other day.      levalbuterol 45 MCG/ACT inhaler   Commonly known as: XOPENEX HFA   Inhale 2 puffs into the lungs every 6 (six) hours as needed for wheezing.      meloxicam 15 MG tablet   Commonly known as: MOBIC   Take 15 mg by mouth daily.      saw palmetto 160 MG capsule   Take 160 mg by mouth 2 (two) times daily.      traMADol 50 MG tablet   Commonly known as: ULTRAM   Take 50 mg by mouth 2 (two) times daily.      XARELTO 20 MG Tabs   Generic drug: Rivaroxaban   Take 20 mg by mouth daily.           Follow-up Information    Follow up with Runell Gess, MD on 06/08/2011. (at 4:00 pm  )    Contact information:   [address] [place] [address] [phone]       Follow up with Margaree Mackintosh, MD. (follow up as previously instructed)    Contact information:   [address] [place] [address] [phone]        Discharge Instructions: Heart Healthy low salt diet.  Call if increased SOB or rapid heart rate.  If you have symptoms of dizziness call the office.  Have lab work done on Friday AM  To check electrolytes. See prescription/order  Signed: Leone Brand 05/26/2011, 3:56 PM

## 2011-05-26 NOTE — Progress Notes (Signed)
D/c orders received; IV removed with gauze on; pt remains in stable condition; pt meds and instructions reviewed and given to pt; pt d/c to home

## 2011-05-26 NOTE — Progress Notes (Signed)
Pt's HR elevated to the 110's to 130's. BP 116/72. Pt resting in bed. Denies pain in his chest and shortness of breath. NP on call made aware new orders received. Will cont to monitor pt.

## 2011-05-26 NOTE — Progress Notes (Signed)
*  PRELIMINARY RESULTS* Echocardiogram Echocardiogram Transesophageal has been performed.  Glean Salen [facility] 05/26/2011, 10:22 AM

## 2011-05-27 ENCOUNTER — Encounter (HOSPITAL_COMMUNITY): Payer: Self-pay | Admitting: Cardiovascular Disease

## 2011-05-27 ENCOUNTER — Other Ambulatory Visit: Payer: Self-pay

## 2011-05-27 MED ORDER — MELOXICAM 15 MG PO TABS: 15.0000 mg | ORAL_TABLET | Freq: Every day | ORAL | Status: DC

## 2011-05-27 NOTE — Progress Notes (Signed)
UR Completed. Simmons, Samari Gorby F [phone]  

## 2011-05-28 DIAGNOSIS — E878 Other disorders of electrolyte and fluid balance, not elsewhere classified: Secondary | ICD-10-CM | POA: Diagnosis not present

## 2011-06-02 ENCOUNTER — Encounter (HOSPITAL_COMMUNITY): Admission: RE | Payer: Self-pay | Source: Ambulatory Visit

## 2011-06-02 ENCOUNTER — Ambulatory Visit (HOSPITAL_COMMUNITY): Admission: RE | Admit: 2011-06-02 | Payer: Medicare Other | Source: Ambulatory Visit | Admitting: Cardiovascular Disease

## 2011-06-02 SURGERY — CARDIOVERSION
Anesthesia: Monitor Anesthesia Care

## 2011-06-03 ENCOUNTER — Ambulatory Visit (INDEPENDENT_AMBULATORY_CARE_PROVIDER_SITE_OTHER): Payer: Medicare Other | Admitting: Internal Medicine

## 2011-06-03 ENCOUNTER — Encounter: Payer: Self-pay | Admitting: Internal Medicine

## 2011-06-03 VITALS — BP 138/78 | HR 84 | Temp 98.6°F | Wt 302.0 lb

## 2011-06-03 DIAGNOSIS — I10 Essential (primary) hypertension: Secondary | ICD-10-CM

## 2011-06-03 DIAGNOSIS — I2510 Atherosclerotic heart disease of native coronary artery without angina pectoris: Secondary | ICD-10-CM

## 2011-06-03 DIAGNOSIS — Z8679 Personal history of other diseases of the circulatory system: Secondary | ICD-10-CM | POA: Diagnosis not present

## 2011-06-03 DIAGNOSIS — E785 Hyperlipidemia, unspecified: Secondary | ICD-10-CM

## 2011-06-08 DIAGNOSIS — I4892 Unspecified atrial flutter: Secondary | ICD-10-CM | POA: Diagnosis not present

## 2011-06-08 DIAGNOSIS — I2510 Atherosclerotic heart disease of native coronary artery without angina pectoris: Secondary | ICD-10-CM | POA: Diagnosis not present

## 2011-06-08 DIAGNOSIS — E782 Mixed hyperlipidemia: Secondary | ICD-10-CM | POA: Diagnosis not present

## 2011-06-13 ENCOUNTER — Encounter: Payer: Self-pay | Admitting: Internal Medicine

## 2011-06-13 NOTE — Patient Instructions (Signed)
Please keep appointment with cardiologist later this afternoon. Rest and take it easy.

## 2011-06-13 NOTE — Progress Notes (Signed)
  Subjective:    Patient ID: Mitchell [name], male    DOB: Sep 08, 1934, 77 y.o.   MRN: [id number]  HPI 77 year old white male recently in the hospital with atrial fibrillation treated by Dr. Nanetta Batty. He is now on Xarelto and is awaiting cardioversion. His daughter who is a International aid/development worker he is concerned about him and accompanies him today along with his wife. They say he has not been feeling well and they are concerned about his blood pressure running high. Apparently enalapril was stopped when he was in the hospital. They have some intermittent readings which are higher than normal for him. This episode of atrial fibrillation seemed to have frightened him and he is a bit anxious. They feel he is a bit pale and weak.    Review of Systems     Objective:   Physical Exam neck is supple without JVD or carotid bruits; chest clear to auscultation; cardiac exam irregular irregular rhythm; 1+ pitting edema lower extremities; he is morbidly obese        Assessment & Plan:  Hypertension  Atrial fibrillation now on Xarelto awaiting cardioversion.  Plan: I spoke with PA at [facility]. Patient will be seen by cardiologist later today. EKG performed

## 2011-06-16 DIAGNOSIS — IMO0002 Reserved for concepts with insufficient information to code with codable children: Secondary | ICD-10-CM | POA: Diagnosis not present

## 2011-06-16 DIAGNOSIS — R269 Unspecified abnormalities of gait and mobility: Secondary | ICD-10-CM | POA: Diagnosis not present

## 2011-06-17 DIAGNOSIS — H43819 Vitreous degeneration, unspecified eye: Secondary | ICD-10-CM | POA: Diagnosis not present

## 2011-06-17 DIAGNOSIS — H02409 Unspecified ptosis of unspecified eyelid: Secondary | ICD-10-CM | POA: Diagnosis not present

## 2011-06-17 DIAGNOSIS — H04129 Dry eye syndrome of unspecified lacrimal gland: Secondary | ICD-10-CM | POA: Diagnosis not present

## 2011-06-17 DIAGNOSIS — H2510 Age-related nuclear cataract, unspecified eye: Secondary | ICD-10-CM | POA: Diagnosis not present

## 2011-06-17 DIAGNOSIS — H40019 Open angle with borderline findings, low risk, unspecified eye: Secondary | ICD-10-CM | POA: Diagnosis not present

## 2011-06-22 DIAGNOSIS — R269 Unspecified abnormalities of gait and mobility: Secondary | ICD-10-CM | POA: Diagnosis not present

## 2011-06-22 DIAGNOSIS — IMO0002 Reserved for concepts with insufficient information to code with codable children: Secondary | ICD-10-CM | POA: Diagnosis not present

## 2011-06-24 DIAGNOSIS — IMO0002 Reserved for concepts with insufficient information to code with codable children: Secondary | ICD-10-CM | POA: Diagnosis not present

## 2011-06-24 DIAGNOSIS — R269 Unspecified abnormalities of gait and mobility: Secondary | ICD-10-CM | POA: Diagnosis not present

## 2011-06-27 NOTE — Progress Notes (Signed)
  Subjective:    Patient ID: Mitchell [name], male    DOB: 1934/04/20, 77 y.o.   MRN: [id number]  HPI patient and his wife wanted to be rechecked here today for elevated blood pressure. Recently had cardioversion which was successful by cardiologist from [facility]. He is now on anticoagulation. Blood pressure has been elevated from time to time in the 150 range systolically. This was concerning to patient and his wife. Also concerning to his daughter who is a International aid/development worker.    Review of Systems     Objective:   Physical Exam chest clear to auscultation; cardiac exam regular rate and rhythm; extremities trace edema        Assessment & Plan:  Hypertension  History of atrial fibrillation status post recent cardioversion  Morbid obesity  Hyperlipidemia  Coronary artery disease  Plan: Patient will increase beta blocker and followup next week with Dr. Allyson Sabal.

## 2011-06-27 NOTE — Patient Instructions (Signed)
Increase beta blocker to control blood pressure. Keep appointment to see Dr. Allyson Sabal next week.

## 2011-06-29 DIAGNOSIS — IMO0002 Reserved for concepts with insufficient information to code with codable children: Secondary | ICD-10-CM | POA: Diagnosis not present

## 2011-06-29 DIAGNOSIS — R269 Unspecified abnormalities of gait and mobility: Secondary | ICD-10-CM | POA: Diagnosis not present

## 2011-07-01 DIAGNOSIS — R269 Unspecified abnormalities of gait and mobility: Secondary | ICD-10-CM | POA: Diagnosis not present

## 2011-07-01 DIAGNOSIS — IMO0002 Reserved for concepts with insufficient information to code with codable children: Secondary | ICD-10-CM | POA: Diagnosis not present

## 2011-08-06 DIAGNOSIS — I10 Essential (primary) hypertension: Secondary | ICD-10-CM | POA: Diagnosis not present

## 2011-08-06 DIAGNOSIS — S8000XA Contusion of unspecified knee, initial encounter: Secondary | ICD-10-CM | POA: Diagnosis not present

## 2011-08-11 ENCOUNTER — Other Ambulatory Visit: Payer: Self-pay

## 2011-08-11 MED ORDER — TRAMADOL HCL 50 MG PO TABS: 50.0000 mg | ORAL_TABLET | Freq: Two times a day (BID) | ORAL | Status: DC

## 2011-08-24 DIAGNOSIS — I519 Heart disease, unspecified: Secondary | ICD-10-CM | POA: Diagnosis not present

## 2011-08-24 DIAGNOSIS — I2510 Atherosclerotic heart disease of native coronary artery without angina pectoris: Secondary | ICD-10-CM | POA: Diagnosis not present

## 2011-08-25 DIAGNOSIS — D235 Other benign neoplasm of skin of trunk: Secondary | ICD-10-CM | POA: Diagnosis not present

## 2011-08-25 DIAGNOSIS — Z85820 Personal history of malignant melanoma of skin: Secondary | ICD-10-CM | POA: Diagnosis not present

## 2011-08-25 DIAGNOSIS — L538 Other specified erythematous conditions: Secondary | ICD-10-CM | POA: Diagnosis not present

## 2011-09-01 DIAGNOSIS — I10 Essential (primary) hypertension: Secondary | ICD-10-CM | POA: Diagnosis not present

## 2011-09-01 DIAGNOSIS — I2510 Atherosclerotic heart disease of native coronary artery without angina pectoris: Secondary | ICD-10-CM | POA: Diagnosis not present

## 2011-09-01 DIAGNOSIS — I4891 Unspecified atrial fibrillation: Secondary | ICD-10-CM | POA: Diagnosis not present

## 2011-09-14 DIAGNOSIS — D443 Neoplasm of uncertain behavior of pituitary gland: Secondary | ICD-10-CM | POA: Diagnosis not present

## 2011-09-14 DIAGNOSIS — E291 Testicular hypofunction: Secondary | ICD-10-CM | POA: Diagnosis not present

## 2011-09-14 DIAGNOSIS — E236 Other disorders of pituitary gland: Secondary | ICD-10-CM | POA: Diagnosis not present

## 2011-09-14 DIAGNOSIS — D444 Neoplasm of uncertain behavior of craniopharyngeal duct: Secondary | ICD-10-CM | POA: Diagnosis not present

## 2011-09-14 DIAGNOSIS — M818 Other osteoporosis without current pathological fracture: Secondary | ICD-10-CM | POA: Diagnosis not present

## 2011-09-28 ENCOUNTER — Other Ambulatory Visit: Payer: Self-pay | Admitting: Internal Medicine

## 2011-09-28 ENCOUNTER — Other Ambulatory Visit: Payer: Medicare Other | Admitting: Internal Medicine

## 2011-09-28 DIAGNOSIS — I2510 Atherosclerotic heart disease of native coronary artery without angina pectoris: Secondary | ICD-10-CM

## 2011-09-28 DIAGNOSIS — I519 Heart disease, unspecified: Secondary | ICD-10-CM

## 2011-09-28 DIAGNOSIS — I10 Essential (primary) hypertension: Secondary | ICD-10-CM

## 2011-09-28 DIAGNOSIS — Z7901 Long term (current) use of anticoagulants: Secondary | ICD-10-CM

## 2011-09-28 DIAGNOSIS — I4892 Unspecified atrial flutter: Secondary | ICD-10-CM

## 2011-09-29 LAB — CBC WITH DIFFERENTIAL/PLATELET
Basophils Absolute: 0 10*3/uL (ref 0.0–0.1)
Basophils Relative: 0 % (ref 0–1)
Eosinophils Absolute: 0.2 10*3/uL (ref 0.0–0.7)
Eosinophils Relative: 2 % (ref 0–5)
HCT: 42.7 % (ref 39.0–52.0)
Hemoglobin: 13.7 g/dL (ref 13.0–17.0)
Lymphocytes Relative: 23 % (ref 12–46)
Lymphs Abs: 2.1 10*3/uL (ref 0.7–4.0)
MCH: 30.3 pg (ref 26.0–34.0)
MCHC: 32.1 g/dL (ref 30.0–36.0)
MCV: 94.5 fL (ref 78.0–100.0)
Monocytes Absolute: 0.8 10*3/uL (ref 0.1–1.0)
Monocytes Relative: 8 % (ref 3–12)
Neutro Abs: 6.1 10*3/uL (ref 1.7–7.7)
Neutrophils Relative %: 67 % (ref 43–77)
Platelets: 246 10*3/uL (ref 150–400)
RBC: 4.52 MIL/uL (ref 4.22–5.81)
RDW: 14.1 % (ref 11.5–15.5)
WBC: 9.2 10*3/uL (ref 4.0–10.5)

## 2011-09-29 LAB — LIPID PANEL
Cholesterol: 153 mg/dL (ref 0–200)
HDL: 37 mg/dL — ABNORMAL LOW (ref >39)
LDL Cholesterol: 98 mg/dL (ref 0–99)
Total CHOL/HDL Ratio: 4.1 Ratio
Triglycerides: 91 mg/dL (ref <150)
VLDL: 18 mg/dL (ref 0–40)

## 2011-09-29 LAB — CMP AND LIVER
ALT: 10 U/L (ref 0–53)
AST: 16 U/L (ref 0–37)
Albumin: 3.7 g/dL (ref 3.5–5.2)
Alkaline Phosphatase: 91 U/L (ref 39–117)
BUN: 19 mg/dL (ref 6–23)
Bilirubin, Direct: 0.1 mg/dL (ref 0.0–0.3)
CO2: 31 mEq/L (ref 19–32)
Calcium: 9 mg/dL (ref 8.4–10.5)
Chloride: 102 mEq/L (ref 96–112)
Creat: 0.79 mg/dL (ref 0.50–1.35)
Glucose, Bld: 92 mg/dL (ref 70–99)
Indirect Bilirubin: 0.3 mg/dL (ref 0.0–0.9)
Potassium: 4.6 mEq/L (ref 3.5–5.3)
Sodium: 139 mEq/L (ref 135–145)
Total Bilirubin: 0.4 mg/dL (ref 0.3–1.2)
Total Protein: 6.1 g/dL (ref 6.0–8.3)

## 2011-09-30 ENCOUNTER — Ambulatory Visit (INDEPENDENT_AMBULATORY_CARE_PROVIDER_SITE_OTHER): Payer: Medicare Other | Admitting: Internal Medicine

## 2011-09-30 DIAGNOSIS — F329 Major depressive disorder, single episode, unspecified: Secondary | ICD-10-CM

## 2011-09-30 DIAGNOSIS — M4800 Spinal stenosis, site unspecified: Secondary | ICD-10-CM

## 2011-09-30 DIAGNOSIS — F32A Depression, unspecified: Secondary | ICD-10-CM

## 2011-09-30 DIAGNOSIS — Z8679 Personal history of other diseases of the circulatory system: Secondary | ICD-10-CM

## 2011-09-30 DIAGNOSIS — M1990 Unspecified osteoarthritis, unspecified site: Secondary | ICD-10-CM

## 2011-09-30 DIAGNOSIS — I2510 Atherosclerotic heart disease of native coronary artery without angina pectoris: Secondary | ICD-10-CM

## 2011-09-30 DIAGNOSIS — Z8709 Personal history of other diseases of the respiratory system: Secondary | ICD-10-CM

## 2011-09-30 DIAGNOSIS — I10 Essential (primary) hypertension: Secondary | ICD-10-CM | POA: Diagnosis not present

## 2011-09-30 DIAGNOSIS — D352 Benign neoplasm of pituitary gland: Secondary | ICD-10-CM

## 2011-10-20 ENCOUNTER — Encounter: Payer: Self-pay | Admitting: Internal Medicine

## 2011-10-20 DIAGNOSIS — H02409 Unspecified ptosis of unspecified eyelid: Secondary | ICD-10-CM | POA: Diagnosis not present

## 2011-10-20 DIAGNOSIS — H04129 Dry eye syndrome of unspecified lacrimal gland: Secondary | ICD-10-CM | POA: Diagnosis not present

## 2011-10-20 DIAGNOSIS — H02139 Senile ectropion of unspecified eye, unspecified eyelid: Secondary | ICD-10-CM | POA: Diagnosis not present

## 2011-10-20 DIAGNOSIS — H04209 Unspecified epiphora, unspecified lacrimal gland: Secondary | ICD-10-CM | POA: Diagnosis not present

## 2011-10-20 DIAGNOSIS — H40019 Open angle with borderline findings, low risk, unspecified eye: Secondary | ICD-10-CM | POA: Diagnosis not present

## 2011-10-20 NOTE — Patient Instructions (Addendum)
Continue same medications and return in 6 months 

## 2011-11-15 ENCOUNTER — Other Ambulatory Visit: Payer: Self-pay

## 2011-11-15 MED ORDER — TRAMADOL HCL 50 MG PO TABS: 50.0000 mg | ORAL_TABLET | Freq: Two times a day (BID) | ORAL | Status: DC

## 2011-11-16 NOTE — Progress Notes (Signed)
  Subjective:    Patient ID: Mitchell [name], male    DOB: June 06, 1934, 77 y.o.   MRN: [id number]  HPI For followup on hypertension hyperlipidemia and coronary artery disease. History of atrial flutter with rapid ventricular response earlier this year status post cardioversion. He has a history of osteoarthritis of his knees status post bilateral knee replacements. History of bilateral hip replacements. History of spinal stenosis. Takes tramadol for pain. History of adenomatous colon polyp March 2011. History of melanoma left neck September 2000. History of morbid obesity. Had Pneumovax immunization January 2004. History of herpes zoster left trunk March 2000. Tetanus immunization September 2011. Gets annual influenza immunization. History of erectile dysfunction for which he takes Viagra.  Had myocardial perfusion study done by Dr. Allyson Sabal January 2012 which was normal. Is on chronic anticoagulation therapy for hx atrial dysrhythmia   Dr. Talmage Nap for pituitary microadenoma with prolactinemia and hypogonadism. Dr. Candie Echevaria [place] is his orthopedist. Dr. Suan Halter is his dermatologist. Dr. Elmer Picker is his ophthalmologist. He also had shoulder surgery April 2012 in [place] for left rotator cuff repair. Last eye exam October 2012.    Review of Systems     Objective:   Physical Exam neck is supple without JVD thyromegaly or carotid bruits. Chest clear to auscultation. Cardiac exam regular rate and rhythm normal S1 and S2. Extremities trace pitting edema. Skin is warm and dry. No concerning lesions identified on skin. He is morbidly obese.        Assessment & Plan:  Morbid obesity  History of atrial flutter with rapid ventricular response status post cardioversion now on chronic anticoagulation with Xarelto.  Spinal stenosis  Osteoarthritis status post bilateral knee and hip replacements  History of pituitary microadenoma with hypogonadism and  prolactinemia  Hypertension  Hyperlipidemia  Coronary artery disease status post angioplasty September 2001-bare-metal stent to right coronary artery  History of adenomatous colon polyp  History of asthma  Plan: Continue tramadol for pain as needed and other medications previously prescribed. Return in 6 months  for physical exam.

## 2011-12-03 ENCOUNTER — Ambulatory Visit (INDEPENDENT_AMBULATORY_CARE_PROVIDER_SITE_OTHER): Payer: Medicare Other | Admitting: Internal Medicine

## 2011-12-03 ENCOUNTER — Encounter: Payer: Self-pay | Admitting: Internal Medicine

## 2011-12-03 VITALS — BP 166/84 | HR 60 | Temp 98.7°F | Ht 71.0 in | Wt 298.0 lb

## 2011-12-03 DIAGNOSIS — Z87448 Personal history of other diseases of urinary system: Secondary | ICD-10-CM

## 2011-12-03 DIAGNOSIS — Z7901 Long term (current) use of anticoagulants: Secondary | ICD-10-CM | POA: Diagnosis not present

## 2011-12-03 DIAGNOSIS — N390 Urinary tract infection, site not specified: Secondary | ICD-10-CM

## 2011-12-03 DIAGNOSIS — R319 Hematuria, unspecified: Secondary | ICD-10-CM

## 2011-12-03 LAB — POCT URINALYSIS DIPSTICK
Bilirubin, UA: NEGATIVE
Glucose, UA: NEGATIVE
Ketones, UA: NEGATIVE
Leukocytes, UA: NEGATIVE
Nitrite, UA: NEGATIVE
Protein, UA: NEGATIVE
Spec Grav, UA: 1.01
Urobilinogen, UA: NEGATIVE
pH, UA: 7.5

## 2011-12-03 LAB — CBC WITH DIFFERENTIAL/PLATELET
HCT: 43.3 % (ref 39.0–52.0)
Hemoglobin: 14 g/dL (ref 13.0–17.0)
Lymphocytes Relative: 19 % (ref 12–46)
Lymphs Abs: 2 10*3/uL (ref 0.7–4.0)
MCH: 30.2 pg (ref 26.0–34.0)
MCHC: 32.4 g/dL (ref 30.0–36.0)
MCV: 93.2 fL (ref 78.0–100.0)
Monocytes Absolute: 0.3 10*3/uL (ref 0.1–1.0)
Monocytes Relative: 3 % (ref 3–12)
Neutro Abs: 7.9 10*3/uL — ABNORMAL HIGH (ref 1.7–7.7)
Neutrophils Relative %: 78 % — ABNORMAL HIGH (ref 43–77)
Platelets: 240 10*3/uL (ref 150–400)
RBC: 4.64 MIL/uL (ref 4.22–5.81)
RDW: 13.4 % (ref 11.5–15.5)
WBC: 10.1 10*3/uL (ref 4.0–10.5)

## 2011-12-03 MED ORDER — CEFTRIAXONE SODIUM 1 G IJ SOLR
1.0000 g | Freq: Once | INTRAMUSCULAR | Status: AC
  Administered 2011-12-03: 1 g via INTRAMUSCULAR

## 2011-12-04 NOTE — Patient Instructions (Addendum)
Rocephin 1 g IM given today for present urinary tract infection. Start Cipro 500 mg twice daily for 10 days. Return in one week for urine specimen check.

## 2011-12-04 NOTE — Progress Notes (Signed)
  Subjective:    Patient ID: Mitchell [name], male    DOB: [birth year], 77 y.o.   MRN: [id number]  HPI Patient has history of hematuria  which usually proves to be a urinary tract infection. Last had episode in 2010. Culture at that time revealed greater than 100,000 col/ml Citrobacter koseri sensitive to Cipro. Also had an episode in 2006 treated by urologist. He was hospitalized in the spring of 2000 at [facility] with urosepsis. In October 1997 had hematuria, culture had insignificant growth, symptoms resolved with treatment with Cipro. October 1989 he had hematuria and dysuria. Culture was negative. He had an IVP in November 1989 which was normal. He subsequently saw urologist and continues to be followed by Dr. Bjorn Pippin for erectile dysfunction with hypogonadism. History of prolactinoma causing low testosterone followed by Dr. Talmage Nap. History of urethral stricture.  Last evening he saw a large spot of blood on his underwear. He's had some urinary frequency. No frank dysuria.  Also has some postnasal drip and sounds a bit hoarse. No cough. No fever or chills.  History of atrial fibrillation with rapid ventricular response status post cardioversion now on chronic anticoagulation therapy with Xarelto. Remains in sinus rhythm  Review of Systems     Objective:   Physical Exam no CVA tenderness; urinalysis is abnormal. Culture was sent. Chest clear to auscultation. Cardiac exam regular rate and rhythm. Extremities without edema.          Assessment & Plan:  Hematuria -likely has UTI  History of hypogonadism and prolactinoma  History of asthma  History of atrial fibrillation now in sinus rhythm status post cardioversion on chronic anticoagulation therapy  Plan: Protime was not checked today. Rocephin 1 g IM given. Start Cipro 500 mg twice daily for 10 days. Return in one week for followup.

## 2011-12-09 ENCOUNTER — Ambulatory Visit (INDEPENDENT_AMBULATORY_CARE_PROVIDER_SITE_OTHER): Payer: Medicare Other | Admitting: Internal Medicine

## 2011-12-09 ENCOUNTER — Encounter: Payer: Self-pay | Admitting: Internal Medicine

## 2011-12-09 VITALS — BP 114/62 | HR 60 | Temp 98.9°F | Wt 299.0 lb

## 2011-12-09 DIAGNOSIS — Z7901 Long term (current) use of anticoagulants: Secondary | ICD-10-CM

## 2011-12-09 DIAGNOSIS — Z96649 Presence of unspecified artificial hip joint: Secondary | ICD-10-CM | POA: Diagnosis not present

## 2011-12-09 DIAGNOSIS — R319 Hematuria, unspecified: Secondary | ICD-10-CM | POA: Diagnosis not present

## 2011-12-09 DIAGNOSIS — Z96659 Presence of unspecified artificial knee joint: Secondary | ICD-10-CM | POA: Diagnosis not present

## 2011-12-09 DIAGNOSIS — N390 Urinary tract infection, site not specified: Secondary | ICD-10-CM

## 2011-12-09 DIAGNOSIS — Z471 Aftercare following joint replacement surgery: Secondary | ICD-10-CM | POA: Diagnosis not present

## 2011-12-09 LAB — POCT URINALYSIS DIPSTICK
Bilirubin, UA: NEGATIVE
Blood, UA: NEGATIVE
Glucose, UA: NEGATIVE
Ketones, UA: NEGATIVE
Leukocytes, UA: NEGATIVE
Nitrite, UA: NEGATIVE
Protein, UA: NEGATIVE
Spec Grav, UA: >=1.03
Urobilinogen, UA: NEGATIVE
pH, UA: 6.5

## 2011-12-18 NOTE — Progress Notes (Signed)
  Subjective:    Patient ID: Mitchell [name], male    DOB: 03/28/1935, 77 y.o.   MRN: [id number]  HPI 77 year old white male in today for one-week followup of hematuria and urinary tract infection. Hematuria has resolved. He was having a great deal of malaise and fatigue when last seen. He had no shaking chills nausea or vomiting but had low-grade fever. Was given an injection of Rocephin and started on Cipro. Says he improved significantly within 48 hours. Unfortunately urine culture was not sent.  He's feeling better. History of atrial fibrillation status post cardioversion on Xarelto.    Review of Systems     Objective:   Physical Exam chest clear to auscultation; cardiac exam regular rate and rhythm normal S1 and S2; extremities without edema. Urinalysis is normal        Assessment & Plan:  Hematuria-resolved  Urinary tract infection-resolved on Cipro  Chronic anticoagulation therapy for history of atrial fibrillation with Xarelto  Based on his history he had a urinary tract infection. Symptoms are similar to previous bouts he has had. Unfortunately urine culture was not sent. Clinically he improved with antibiotics. Do not think hematuria was caused by Xarelto because of prior history of similar episodes before being placed on anticoagulation.

## 2011-12-18 NOTE — Patient Instructions (Addendum)
Hematuria has resolved. Symptoms were likely due to urinary tract infection. Return as needed.

## 2012-01-31 ENCOUNTER — Other Ambulatory Visit: Payer: Self-pay

## 2012-01-31 MED ORDER — AMLODIPINE BESYLATE 5 MG PO TABS: 5.0000 mg | ORAL_TABLET | Freq: Every day | ORAL | Status: DC

## 2012-02-01 DIAGNOSIS — Z23 Encounter for immunization: Secondary | ICD-10-CM | POA: Diagnosis not present

## 2012-02-11 ENCOUNTER — Ambulatory Visit (INDEPENDENT_AMBULATORY_CARE_PROVIDER_SITE_OTHER): Payer: Medicare Other | Admitting: Internal Medicine

## 2012-02-11 ENCOUNTER — Encounter: Payer: Self-pay | Admitting: Internal Medicine

## 2012-02-11 VITALS — BP 130/76 | HR 68 | Temp 99.2°F | Wt 296.0 lb

## 2012-02-11 DIAGNOSIS — J069 Acute upper respiratory infection, unspecified: Secondary | ICD-10-CM | POA: Diagnosis not present

## 2012-02-11 DIAGNOSIS — H109 Unspecified conjunctivitis: Secondary | ICD-10-CM | POA: Diagnosis not present

## 2012-02-11 MED ORDER — CEFTRIAXONE SODIUM 1 G IJ SOLR
1.0000 g | Freq: Once | INTRAMUSCULAR | Status: AC
  Administered 2012-02-11: 1 g via INTRAMUSCULAR

## 2012-02-11 NOTE — Patient Instructions (Signed)
Take Levaquin 500 milligrams daily for 10 days. Use of Ofloxacin ophthalmic drops 2 drops in each eye 4 times daily for 5-7 days

## 2012-02-11 NOTE — Addendum Note (Signed)
Addended by: Judy Pimple on: 02/11/2012 12:54 PM   Modules accepted: Orders

## 2012-02-11 NOTE — Progress Notes (Signed)
  Subjective:    Patient ID: SHAWNMICHAEL [name], male    DOB: 1934-05-02, 77 y.o.   MRN: [id number]  HPI 77 year old white male came down with upper respiratory infection about a week after taking influenza vaccine. Has had cough and congestion. Some discolored sputum production. No wheezing or shortness of breath. Sounds nasally congested. No fever or shaking chills. 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 He was running and it seemed like it could be viral be are dusty they her. He seemed to get worse. Left eye began to bother him and he had some drainage from it yesterday and this morning.  Also was to have urine rechecked with prior history of urinary tract infection and hematuria. Urinalysis today shows no significant hematuria or infection    Review of Systems     Objective:   Physical Exam HEENT exam: TMs are clear. Pharynx slightly injected without exudate. Sounds nasally congested and hoarse. Neck is supple without adenopathy. Skin is warm and dry. Chest clear to auscultation without rales or wheezing. Left eye conjunctiva irritated.        Assessment & Plan:  Upper respiratory infection  Conjunctivitis  History of coronary artery disease  History of hematuria and urinary tract infections-UA today is normal  Plan: Rocephin 1 g IM. Levaquin 500 milligrams daily for 10 days. Ofloxacin ophthalmic drops 2 drops in each eye 4 times a day for 5-7 days. Has inhaler to use if needed for shortness of breath or wheezing

## 2012-03-08 DIAGNOSIS — E895 Postprocedural testicular hypofunction: Secondary | ICD-10-CM | POA: Diagnosis not present

## 2012-03-14 DIAGNOSIS — E291 Testicular hypofunction: Secondary | ICD-10-CM | POA: Diagnosis not present

## 2012-03-14 DIAGNOSIS — E236 Other disorders of pituitary gland: Secondary | ICD-10-CM | POA: Diagnosis not present

## 2012-03-14 DIAGNOSIS — D444 Neoplasm of uncertain behavior of craniopharyngeal duct: Secondary | ICD-10-CM | POA: Diagnosis not present

## 2012-03-14 DIAGNOSIS — D443 Neoplasm of uncertain behavior of pituitary gland: Secondary | ICD-10-CM | POA: Diagnosis not present

## 2012-03-14 DIAGNOSIS — M818 Other osteoporosis without current pathological fracture: Secondary | ICD-10-CM | POA: Diagnosis not present

## 2012-03-22 DIAGNOSIS — I2510 Atherosclerotic heart disease of native coronary artery without angina pectoris: Secondary | ICD-10-CM | POA: Diagnosis not present

## 2012-03-22 DIAGNOSIS — I4892 Unspecified atrial flutter: Secondary | ICD-10-CM | POA: Diagnosis not present

## 2012-03-22 DIAGNOSIS — E782 Mixed hyperlipidemia: Secondary | ICD-10-CM | POA: Diagnosis not present

## 2012-03-28 ENCOUNTER — Other Ambulatory Visit: Payer: Medicare Other | Admitting: Internal Medicine

## 2012-03-28 DIAGNOSIS — E785 Hyperlipidemia, unspecified: Secondary | ICD-10-CM | POA: Diagnosis not present

## 2012-03-28 DIAGNOSIS — N400 Enlarged prostate without lower urinary tract symptoms: Secondary | ICD-10-CM

## 2012-03-28 DIAGNOSIS — Z79899 Other long term (current) drug therapy: Secondary | ICD-10-CM

## 2012-03-28 DIAGNOSIS — Z125 Encounter for screening for malignant neoplasm of prostate: Secondary | ICD-10-CM

## 2012-03-28 DIAGNOSIS — I10 Essential (primary) hypertension: Secondary | ICD-10-CM | POA: Diagnosis not present

## 2012-03-28 LAB — CBC WITH DIFFERENTIAL/PLATELET
Basophils Absolute: 0 10*3/uL (ref 0.0–0.1)
Basophils Relative: 0 % (ref 0–1)
Eosinophils Absolute: 0.3 10*3/uL (ref 0.0–0.7)
Eosinophils Relative: 3 % (ref 0–5)
HCT: 42.5 % (ref 39.0–52.0)
Hemoglobin: 14.6 g/dL (ref 13.0–17.0)
Lymphocytes Relative: 18 % (ref 12–46)
Lymphs Abs: 1.9 10*3/uL (ref 0.7–4.0)
MCH: 31.1 pg (ref 26.0–34.0)
MCHC: 34.4 g/dL (ref 30.0–36.0)
MCV: 90.6 fL (ref 78.0–100.0)
Monocytes Absolute: 0.8 10*3/uL (ref 0.1–1.0)
Monocytes Relative: 8 % (ref 3–12)
Neutro Abs: 7.3 10*3/uL (ref 1.7–7.7)
Neutrophils Relative %: 71 % (ref 43–77)
Platelets: 269 10*3/uL (ref 150–400)
RBC: 4.69 MIL/uL (ref 4.22–5.81)
RDW: 13.8 % (ref 11.5–15.5)
WBC: 10.4 10*3/uL (ref 4.0–10.5)

## 2012-03-28 LAB — COMPREHENSIVE METABOLIC PANEL
ALT: 9 U/L (ref 0–53)
AST: 14 U/L (ref 0–37)
Albumin: 4.1 g/dL (ref 3.5–5.2)
Alkaline Phosphatase: 106 U/L (ref 39–117)
BUN: 18 mg/dL (ref 6–23)
CO2: 31 mEq/L (ref 19–32)
Calcium: 9.1 mg/dL (ref 8.4–10.5)
Chloride: 101 mEq/L (ref 96–112)
Creat: 0.68 mg/dL (ref 0.50–1.35)
Glucose, Bld: 96 mg/dL (ref 70–99)
Potassium: 4.9 mEq/L (ref 3.5–5.3)
Sodium: 136 mEq/L (ref 135–145)
Total Bilirubin: 0.4 mg/dL (ref 0.3–1.2)
Total Protein: 6.4 g/dL (ref 6.0–8.3)

## 2012-03-28 LAB — LIPID PANEL
Cholesterol: 159 mg/dL (ref 0–200)
HDL: 39 mg/dL — ABNORMAL LOW (ref >39)
LDL Cholesterol: 100 mg/dL — ABNORMAL HIGH (ref 0–99)
Total CHOL/HDL Ratio: 4.1 Ratio
Triglycerides: 98 mg/dL (ref <150)
VLDL: 20 mg/dL (ref 0–40)

## 2012-03-28 LAB — PSA, MEDICARE: PSA: 0.58 ng/mL (ref <=4.00)

## 2012-03-30 ENCOUNTER — Encounter: Payer: Self-pay | Admitting: Internal Medicine

## 2012-03-30 ENCOUNTER — Ambulatory Visit (INDEPENDENT_AMBULATORY_CARE_PROVIDER_SITE_OTHER): Payer: Medicare Other | Admitting: Internal Medicine

## 2012-03-30 VITALS — BP 128/82 | HR 68 | Temp 98.3°F | Ht 71.5 in | Wt 288.0 lb

## 2012-03-30 DIAGNOSIS — D352 Benign neoplasm of pituitary gland: Secondary | ICD-10-CM

## 2012-03-30 DIAGNOSIS — G8929 Other chronic pain: Secondary | ICD-10-CM

## 2012-03-30 DIAGNOSIS — Z8669 Personal history of other diseases of the nervous system and sense organs: Secondary | ICD-10-CM

## 2012-03-30 DIAGNOSIS — IMO0001 Reserved for inherently not codable concepts without codable children: Secondary | ICD-10-CM

## 2012-03-30 DIAGNOSIS — D126 Benign neoplasm of colon, unspecified: Secondary | ICD-10-CM | POA: Diagnosis not present

## 2012-03-30 DIAGNOSIS — Z8709 Personal history of other diseases of the respiratory system: Secondary | ICD-10-CM

## 2012-03-30 DIAGNOSIS — Z8679 Personal history of other diseases of the circulatory system: Secondary | ICD-10-CM

## 2012-03-30 DIAGNOSIS — Z87898 Personal history of other specified conditions: Secondary | ICD-10-CM

## 2012-03-30 DIAGNOSIS — Z85820 Personal history of malignant melanoma of skin: Secondary | ICD-10-CM | POA: Diagnosis not present

## 2012-03-30 DIAGNOSIS — Z955 Presence of coronary angioplasty implant and graft: Secondary | ICD-10-CM

## 2012-03-30 DIAGNOSIS — Z9861 Coronary angioplasty status: Secondary | ICD-10-CM | POA: Diagnosis not present

## 2012-03-30 DIAGNOSIS — I10 Essential (primary) hypertension: Secondary | ICD-10-CM

## 2012-03-30 DIAGNOSIS — E229 Hyperfunction of pituitary gland, unspecified: Secondary | ICD-10-CM

## 2012-03-30 LAB — POCT URINALYSIS DIPSTICK
Bilirubin, UA: NEGATIVE
Blood, UA: NEGATIVE
Glucose, UA: NEGATIVE
Ketones, UA: NEGATIVE
Leukocytes, UA: NEGATIVE
Nitrite, UA: NEGATIVE
Spec Grav, UA: 1.015
Urobilinogen, UA: NEGATIVE
pH, UA: 7

## 2012-03-30 NOTE — Progress Notes (Signed)
Subjective:    Patient ID: Mitchell [name], male    DOB: Nov 01, 1934, 77 y.o.   MRN: [id number]  HPI 77 year old White male with history of PAF on chronic anticoagulation therapy. Just taking Tenormin hs. Feels better in am since stopping tenormin in am. Has had falls in past year. Knees are still an issue as is weight. In and in and he also has a history of hypertension, hyperlipidemia and coronary artery disease. In 2013 he had atrial flutter with rapid ventricular response requiring cardioversion and is now on Xarelto.  He has a history of osteoarthritis of the knees status post bilateral knee replacements. History of bilateral hip replacements. History of spinal stenosis. Takes tramadol for pain.  History of adenomatous colon polyp March 2011. Colonoscopy done by Dr. Laural Benes at [facility]  History of melanoma left neck September 2000.  Long-standing history of morbid obesity. History of erectile dysfunction for which he has taken Viagra.  Had myocardial perfusion study done by Dr. Allyson Sabal January 2012 which was normal.  Had tetanus immunization September 2011, Pneumovax immunization January 2004.  History of herpes zoster left trunk March 2000.  Sees Dr. Talmage Nap for pituitary microadenoma with prolactinemia and hypogonadism.  Dr. Candie Echevaria in [place] is his orthopedist. Dr. Suan Halter is his dermatologist. Dr. Elmer Picker is his ophthalmologist.  History of intermittent hematuria which occurred prior to being on anticoagulation therapy. History of recurrent urinary tract infection from time to time.  He had angioplasty September 2001 with a bare-metal stent being placed to the right coronary artery.  History of asthma  Social history: Married with 2 adult children. Does not smoke. Self-employed cattleman. 11 grade education. Does not consume alcohol.  Family history: Father died at age 30 of an MI. Mother died at age 74 of "old age ". One sister in good health.  Appendectomy 1958, right  carpal tunnel release 1991, decompressive laminectomy L3-L4 L4-L5 for spinal stenosis September 1995. Umbilical hernia repair 1997.  Right DVT and ruptured Baker cyst hospitalized February 1993.  Left lower lobe pneumonia 1996.  History of sleep apnea but doesn't tolerate CPAP.    Review of Systems  Constitutional: Negative.   HENT: Negative.   Eyes: Negative.   Respiratory: Negative.   Cardiovascular: Negative.   Gastrointestinal: Negative.   Endocrine:       History pituitary microadenoma followed by Dr. Talmage Nap  Genitourinary:       History of erectile dysfunction.  Allergic/Immunologic: Negative.   Neurological: Negative.   Hematological: Negative.   Psychiatric/Behavioral: Negative.        Objective:   Physical Exam  Vitals reviewed. Constitutional: He is oriented to person, place, and time. He appears well-developed and well-nourished. No distress.  HENT:  Head: Normocephalic and atraumatic.  Right Ear: External ear normal.  Left Ear: External ear normal.  Mouth/Throat: Oropharynx is clear and moist.  Eyes: Conjunctivae and EOM are normal. Pupils are equal, round, and reactive to light. Right eye exhibits no discharge. Left eye exhibits no discharge. No scleral icterus.  Neck: Neck supple. No JVD present. No thyromegaly present.  Cardiovascular: Normal rate, regular rhythm, normal heart sounds and intact distal pulses.   No murmur heard. Pulmonary/Chest: Effort normal and breath sounds normal. No respiratory distress. He has no wheezes. He has no rales.  Abdominal: Soft. Bowel sounds are normal. He exhibits no distension and no mass. There is no tenderness. There is no rebound and no guarding.  Genitourinary: Prostate normal.  Musculoskeletal: Normal range  of motion. He exhibits no edema.  Lymphadenopathy:    He has no cervical adenopathy.  Neurological: He is alert and oriented to person, place, and time. He has normal reflexes. No cranial nerve deficit.  Coordination normal.  Skin: Skin is warm and dry. He is not diaphoretic.  Psychiatric: He has a normal mood and affect. His behavior is normal. Judgment and thought content normal.          Assessment & Plan:  Morbid obesity  History of asthma and asthmatic bronchitis  Status post bilateral knee replacements and bilateral hip replacements  History of spinal stenosis status post decompressive laminectomy 1995 L3-L4 L4-L5  Chronic musculoskeletal pain treated with tramadol  History of atrial fib flutter with rapid ventricular response now on Xarelto  History of coronary artery disease status post bare-metal stent to right coronary artery 2001  History pituitary microadenoma and followed by Dr. Talmage Nap  History of prostatitis, urinary tract infection and occasional hematuria even prior to being placed on anticoagulation  History of hypertension  History of melanoma  History of adenomatous colon polyp 2011  Plan: Return in 6 months. No change in medication. Prescription for Zostavax given     Subjective:   Patient presents for Medicare Annual/Subsequent preventive examination.   Review Past Medical/Family/Social: see EPIC   Risk Factors  Current exercise habits: walks some Dietary issues discussed: low fat low carb  Cardiac risk factors: HTN. Hyperlipidemia  Depression Screen  (Note: if answer to either of the following is "Yes", a more complete depression screening is indicated)   Over the past two weeks, have you felt down, depressed or hopeless? No  Over the past two weeks, have you felt little interest or pleasure in doing things? No Have you lost interest or pleasure in daily life? No Do you often feel hopeless? No Do you cry easily over simple problems? No   Activities of Daily Living  In your present state of health, do you have any difficulty performing the following activities?:   Driving? No  Managing money? No  Feeding yourself? No  Getting from  bed to chair? No  Climbing a flight of stairs? No  Preparing food and eating?: No  Bathing or showering? No  Getting dressed: No  Getting to the toilet? No  Using the toilet:No  Moving around from place to place: No  In the past year have you fallen or had a near fall?:yes Are you sexually active? yes Do you have more than one partner? No   Hearing Difficulties: yes Do you often ask people to speak up or repeat themselves? No  Do you experience ringing or noises in your ears? No  Do you have difficulty understanding soft or whispered voices? yes Do you feel that you have a problem with memory? No Do you often misplace items? No    Home Safety:  Do you have a smoke alarm at your residence? Yes Do you have grab bars in the bathroom? yes Do you have throw rugs in your house? no   Cognitive Testing  Alert? Yes Normal Appearance?Yes  Oriented to person? Yes Place? Yes  Time? Yes  Recall of three objects? Yes  Can perform simple calculations? Yes  Displays appropriate judgment?Yes  Can read the correct time from a watch face?Yes   List the Names of Other Physician/Practitioners you currently use:  See referral list for the physicians patient is currently seeing. [facility]    Review of Systems: see  EPIC   Objective:     General appearance: Appears stated age and obese  Head: Normocephalic, without obvious abnormality, atraumatic  Eyes: conj clear, EOMi PEERLA  Ears: normal TM's and external ear canals both ears  Nose: Nares normal. Septum midline. Mucosa normal. No drainage or sinus tenderness.  Throat: lips, mucosa, and tongue normal; teeth and gums normal  Neck: no adenopathy, no carotid bruit, no JVD, supple, symmetrical, trachea midline and thyroid not enlarged, symmetric, no tenderness/mass/nodules  No CVA tenderness.  Lungs: clear to auscultation bilaterally  Breasts: normal appearance, no masses or tenderness. Heart: regular rate  and rhythm, S1, S2 normal, no murmur, click, rub or gallop  Abdomen: soft, non-tender; bowel sounds normal; no masses, no organomegaly  Musculoskeletal: ROM normal in all joints, no crepitus, no deformity, Normal muscle strengthen. Back  is symmetric, no curvature. Skin: Skin color, texture, turgor normal. No rashes or lesions  Lymph nodes: Cervical, supraclavicular, and axillary nodes normal.  Neurologic: CN 2 -12 Normal, Normal symmetric reflexes. Normal coordination and gait  Psych: Alert & Oriented x 3, Mood appear stable.    Assessment:    Annual wellness medicare exam   Plan:    During the course of the visit the patient was educated and counseled about appropriate screening and preventive services including:        Patient Instructions (the written plan) was given to the patient.  Medicare Attestation  I have personally reviewed:  The patient's medical and social history  Their use of alcohol, tobacco or illicit drugs  Their current medications and supplements  The patient's functional ability including ADLs,fall risks, home safety risks, cognitive, and hearing and visual impairment  Diet and physical activities  Evidence for depression or mood disorders  The patient's weight, height, BMI, and visual acuity have been recorded in the chart. I have made referrals, counseling, and provided education to the patient based on review of the above and I have provided the patient with a written personalized care plan for preventive services.

## 2012-04-21 ENCOUNTER — Encounter: Payer: Self-pay | Admitting: Internal Medicine

## 2012-05-25 DIAGNOSIS — H35039 Hypertensive retinopathy, unspecified eye: Secondary | ICD-10-CM | POA: Diagnosis not present

## 2012-05-25 DIAGNOSIS — H02409 Unspecified ptosis of unspecified eyelid: Secondary | ICD-10-CM | POA: Diagnosis not present

## 2012-05-25 DIAGNOSIS — H40019 Open angle with borderline findings, low risk, unspecified eye: Secondary | ICD-10-CM | POA: Diagnosis not present

## 2012-05-25 DIAGNOSIS — H02139 Senile ectropion of unspecified eye, unspecified eyelid: Secondary | ICD-10-CM | POA: Diagnosis not present

## 2012-05-25 DIAGNOSIS — H04129 Dry eye syndrome of unspecified lacrimal gland: Secondary | ICD-10-CM | POA: Diagnosis not present

## 2012-05-25 DIAGNOSIS — H2510 Age-related nuclear cataract, unspecified eye: Secondary | ICD-10-CM | POA: Diagnosis not present

## 2012-06-14 ENCOUNTER — Telehealth: Payer: Self-pay | Admitting: Internal Medicine

## 2012-06-14 NOTE — Telephone Encounter (Signed)
Pt had onset of acute RLQ pain about an hour ago. No hematuria. Has had appendectomy in the past. Advise go to ED- may need CT to evaluate. Has seen urologist in the past for hematuria and UTI.

## 2012-06-15 ENCOUNTER — Telehealth: Payer: Self-pay | Admitting: Internal Medicine

## 2012-06-15 NOTE — Telephone Encounter (Signed)
His wife called yesterday saying he had had acute onset of right lower quadrant pain extending into his scrotum. Emergency department visit was advised. Apparently he waited a couple of hours and the pain resolved and he did not seek treatment in the emergency department. Call today was to followup on his complaints which apparently have completely resolved and he feels okay today

## 2012-06-19 ENCOUNTER — Other Ambulatory Visit: Payer: Self-pay | Admitting: Internal Medicine

## 2012-06-29 ENCOUNTER — Encounter: Payer: Self-pay | Admitting: Internal Medicine

## 2012-06-29 ENCOUNTER — Ambulatory Visit: Payer: Medicare Other | Admitting: Internal Medicine

## 2012-06-29 VITALS — BP 140/82 | HR 68 | Temp 98.3°F | Wt 290.0 lb

## 2012-06-29 DIAGNOSIS — N390 Urinary tract infection, site not specified: Secondary | ICD-10-CM

## 2012-06-29 DIAGNOSIS — R300 Dysuria: Secondary | ICD-10-CM

## 2012-06-29 DIAGNOSIS — R319 Hematuria, unspecified: Secondary | ICD-10-CM

## 2012-06-29 DIAGNOSIS — N419 Inflammatory disease of prostate, unspecified: Secondary | ICD-10-CM | POA: Diagnosis not present

## 2012-06-29 LAB — POCT URINALYSIS DIPSTICK
Bilirubin, UA: NEGATIVE
Glucose, UA: NEGATIVE
Ketones, UA: NEGATIVE
Leukocytes, UA: NEGATIVE
Nitrite, UA: NEGATIVE
Protein, UA: NEGATIVE
Spec Grav, UA: 1.015
Urobilinogen, UA: NEGATIVE
pH, UA: 5.5

## 2012-06-29 MED ORDER — CEFTRIAXONE SODIUM 1 G IJ SOLR
1.0000 g | Freq: Once | INTRAMUSCULAR | Status: AC
  Administered 2012-06-29: 1 g via INTRAMUSCULAR

## 2012-06-29 NOTE — Progress Notes (Signed)
  Subjective:    Patient ID: Mitchell [name], male    DOB: 1935-02-18, 78 y.o.   MRN: [id number]  HPI Patient's wife called 06/14/2012 saying that he had some right lower corner pain extending into his right scrotum. There was some concern of a kidney stone. He was advised to go the emergency department. However he did not go and after about 3 hours he felt better. Onset of dysuria for a couple of days. No documented fever or chills. He's had some low back pain. He saw a small amount of blood in his underwear last evening. No nausea or vomiting. He is on Xarelto. He has a history of coronary artery disease. Had bare metal stent placed RCA in 2001. History of atrial flutter with rapid ventricular response. History of multivessel coronary artery disease.  Review of Systems     Objective:   Physical Exam urinalysis today is really unremarkable except for moderate nonhemolyzed occult blood. No LAD. Specific gravity 1020. Culture was sent. No CVA tenderness. Examination of scrotum shows right testicle to be larger than the left. Patient and wife say Dr. Annabell Howells is aware of this. Prostate is boggy.        Assessment & Plan:  Prostatitis  Plan: Urine sent for culture and sensitivity. 1 g IM Rocephin. Cipro 500 mg by mouth twice a day for 2 weeks. He has had similar episodes in the past and this is how we proceeded with improved symptoms within 24-48 hours.

## 2012-06-29 NOTE — Patient Instructions (Addendum)
Take Cipro 500 mg twice daily for 2 weeks. You have been given 1 g IM Rocephin. Culture is pending of the urine

## 2012-07-01 LAB — URINE CULTURE
Colony Count: NO GROWTH
Organism ID, Bacteria: NO GROWTH

## 2012-07-01 NOTE — Patient Instructions (Addendum)
Continue same medications and return in 6 months try to lose weight

## 2012-07-03 NOTE — Progress Notes (Signed)
Patient informed. 

## 2012-07-12 DIAGNOSIS — D235 Other benign neoplasm of skin of trunk: Secondary | ICD-10-CM | POA: Diagnosis not present

## 2012-07-12 DIAGNOSIS — Z85820 Personal history of malignant melanoma of skin: Secondary | ICD-10-CM | POA: Diagnosis not present

## 2012-07-12 DIAGNOSIS — L570 Actinic keratosis: Secondary | ICD-10-CM | POA: Diagnosis not present

## 2012-09-13 ENCOUNTER — Ambulatory Visit (INDEPENDENT_AMBULATORY_CARE_PROVIDER_SITE_OTHER): Payer: Medicare Other | Admitting: Internal Medicine

## 2012-09-13 ENCOUNTER — Encounter: Payer: Self-pay | Admitting: Internal Medicine

## 2012-09-13 VITALS — BP 114/82 | HR 68 | Temp 98.8°F | Wt 292.0 lb

## 2012-09-13 DIAGNOSIS — R3129 Other microscopic hematuria: Secondary | ICD-10-CM | POA: Diagnosis not present

## 2012-09-13 DIAGNOSIS — N419 Inflammatory disease of prostate, unspecified: Secondary | ICD-10-CM

## 2012-09-13 DIAGNOSIS — N400 Enlarged prostate without lower urinary tract symptoms: Secondary | ICD-10-CM | POA: Diagnosis not present

## 2012-09-13 LAB — POCT URINALYSIS DIPSTICK
Bilirubin, UA: NEGATIVE
Glucose, UA: NEGATIVE
Ketones, UA: NEGATIVE
Leukocytes, UA: NEGATIVE
Nitrite, UA: NEGATIVE
Spec Grav, UA: 1.02
Urobilinogen, UA: NEGATIVE
pH, UA: 6.5

## 2012-09-13 LAB — CBC WITH DIFFERENTIAL/PLATELET
Basophils Absolute: 0 10*3/uL (ref 0.0–0.1)
Basophils Relative: 0 % (ref 0–1)
Eosinophils Absolute: 0.1 10*3/uL (ref 0.0–0.7)
Eosinophils Relative: 1 % (ref 0–5)
HCT: 42.5 % (ref 39.0–52.0)
Hemoglobin: 14.5 g/dL (ref 13.0–17.0)
Lymphocytes Relative: 14 % (ref 12–46)
Lymphs Abs: 2 10*3/uL (ref 0.7–4.0)
MCH: 31.9 pg (ref 26.0–34.0)
MCHC: 34.1 g/dL (ref 30.0–36.0)
MCV: 93.6 fL (ref 78.0–100.0)
Monocytes Absolute: 1.3 10*3/uL — ABNORMAL HIGH (ref 0.1–1.0)
Monocytes Relative: 9 % (ref 3–12)
Neutro Abs: 10.9 10*3/uL — ABNORMAL HIGH (ref 1.7–7.7)
Neutrophils Relative %: 76 % (ref 43–77)
Platelets: 255 10*3/uL (ref 150–400)
RBC: 4.54 MIL/uL (ref 4.22–5.81)
RDW: 14 % (ref 11.5–15.5)
WBC: 14.3 10*3/uL — ABNORMAL HIGH (ref 4.0–10.5)

## 2012-09-13 MED ORDER — CEFTRIAXONE SODIUM 1 G IJ SOLR
1.0000 g | Freq: Once | INTRAMUSCULAR | Status: AC
  Administered 2012-09-13: 1 g via INTRAMUSCULAR

## 2012-09-13 NOTE — Progress Notes (Signed)
  Subjective:    Patient ID: Mitchell [name], male    DOB: [birth year], 78 y.o.   MRN: [id number]  HPI 78 year old White male had onset of dysuria and hematuria on Saturday, May 24. He had a refill on Levaquin which he began to take 500 mg daily. Thus far he has some improvement with dysuria but still feels sluggish. No fever or shaking chills. No nausea and vomiting. Has had some back pain preceding onset of dysuria. He has a history of hematuria intermittently dating back to 1989. This is usually associated with the urinary infection/prostatitis he has history of bilateral spermatoceles. History of hypogonadism with erectile dysfunction. Has pituitary adenoma treated by Dr. Talmage Nap. History of nocturia. Urologist is Dr. Annabell Howells. No history of kidney stones. He's had episodes of hematuria associated with infections in 2000, 2002, 2006, 2010. Had IVP and cystoscopy 2002. History of some mild urethral stricturing. Always responds to quinolone therapy.   Review of Systems     Objective:   Physical Exam prostate slightly boggy. CBC with differential pending. Urinalysis shows non-trace hemolyzed occult blood. Culture sent.        Assessment & Plan:  Urinary tract infection  Malaise and fatigue-encourage patient to call for appointment see Cardiologist in the near future.  Plan: 1 g IM Rocephin. Continue Levaquin 500 milligrams daily to complete a 14 day course. Urine culture pending  Addendum: WBC is 14,300.

## 2012-09-13 NOTE — Patient Instructions (Addendum)
Take Levaquin 500 milligrams daily to complete a two-week course. Culture is pending. CBC is 14,300. Call for cardiology appointment. Call if symptoms do not improve in 48 hours.

## 2012-09-14 LAB — URINE CULTURE
Colony Count: NO GROWTH
Organism ID, Bacteria: NO GROWTH

## 2012-09-21 ENCOUNTER — Other Ambulatory Visit: Payer: Self-pay | Admitting: Internal Medicine

## 2012-09-21 ENCOUNTER — Encounter: Payer: Self-pay | Admitting: Internal Medicine

## 2012-09-21 ENCOUNTER — Ambulatory Visit (INDEPENDENT_AMBULATORY_CARE_PROVIDER_SITE_OTHER): Payer: Medicare Other | Admitting: Internal Medicine

## 2012-09-21 VITALS — BP 126/74 | HR 68 | Temp 98.7°F | Wt 285.0 lb

## 2012-09-21 DIAGNOSIS — M19019 Primary osteoarthritis, unspecified shoulder: Secondary | ICD-10-CM

## 2012-09-21 DIAGNOSIS — N390 Urinary tract infection, site not specified: Secondary | ICD-10-CM

## 2012-09-21 DIAGNOSIS — N419 Inflammatory disease of prostate, unspecified: Secondary | ICD-10-CM | POA: Diagnosis not present

## 2012-09-21 DIAGNOSIS — Z79899 Other long term (current) drug therapy: Secondary | ICD-10-CM | POA: Diagnosis not present

## 2012-09-21 DIAGNOSIS — R5383 Other fatigue: Secondary | ICD-10-CM

## 2012-09-21 DIAGNOSIS — R5381 Other malaise: Secondary | ICD-10-CM | POA: Diagnosis not present

## 2012-09-21 DIAGNOSIS — I519 Heart disease, unspecified: Secondary | ICD-10-CM

## 2012-09-21 DIAGNOSIS — I10 Essential (primary) hypertension: Secondary | ICD-10-CM

## 2012-09-21 DIAGNOSIS — E785 Hyperlipidemia, unspecified: Secondary | ICD-10-CM

## 2012-09-21 DIAGNOSIS — W57XXXA Bitten or stung by nonvenomous insect and other nonvenomous arthropods, initial encounter: Secondary | ICD-10-CM | POA: Diagnosis not present

## 2012-09-21 DIAGNOSIS — M19011 Primary osteoarthritis, right shoulder: Secondary | ICD-10-CM

## 2012-09-21 DIAGNOSIS — T148 Other injury of unspecified body region: Secondary | ICD-10-CM | POA: Diagnosis not present

## 2012-09-21 DIAGNOSIS — Z7901 Long term (current) use of anticoagulants: Secondary | ICD-10-CM

## 2012-09-21 LAB — POCT URINALYSIS DIPSTICK
Bilirubin, UA: NEGATIVE
Blood, UA: NEGATIVE
Glucose, UA: NEGATIVE
Ketones, UA: NEGATIVE
Leukocytes, UA: NEGATIVE
Nitrite, UA: NEGATIVE
Spec Grav, UA: 1.015
Urobilinogen, UA: NEGATIVE
pH, UA: 7

## 2012-09-21 LAB — LIPID PANEL
Cholesterol: 148 mg/dL (ref 0–200)
HDL: 39 mg/dL — ABNORMAL LOW (ref >39)
LDL Cholesterol: 97 mg/dL (ref 0–99)
Total CHOL/HDL Ratio: 3.8 Ratio
Triglycerides: 58 mg/dL (ref <150)
VLDL: 12 mg/dL (ref 0–40)

## 2012-09-21 LAB — HEPATIC FUNCTION PANEL
ALT: 9 U/L (ref 0–53)
AST: 15 U/L (ref 0–37)
Albumin: 3.5 g/dL (ref 3.5–5.2)
Alkaline Phosphatase: 109 U/L (ref 39–117)
Bilirubin, Direct: 0.1 mg/dL (ref 0.0–0.3)
Indirect Bilirubin: 0.3 mg/dL (ref 0.0–0.9)
Total Bilirubin: 0.4 mg/dL (ref 0.3–1.2)
Total Protein: 6 g/dL (ref 6.0–8.3)

## 2012-09-21 NOTE — Progress Notes (Signed)
  Subjective:    Patient ID: Mitchell [name], male    DOB: [birth year], 78 y.o.   MRN: [id number]  HPI Patient was seen on May 28 for urinary tract infection. He was given 1 g IM Rocephin and continued on quinolone antibiotic. He's feeling better but not 100% just yet. Still feels sluggish in the mornings. Says he has some popping in his joints. He's had a number of tick bites and is afraid he may have Lyme disease. Urinalysis today is normal. Fasting lipid panel liver functions drawn. He is on statin medication. History of coronary artery disease. Appointment to see  Dr. Allyson Sabal soon. Here today for six-month recheck.  History of paroxysmal atrial fibrillation 2013 maintained on Xarelto. History of hypertension and hyperlipidemia.  In 2001 he had bare metal stent placed in right coronary artery.  Long-standing history of morbid obesity and osteoarthritis. Takes tramadol for pain.  History of microadenoma in the pituitary gland with history of elevated prolactin followed by Dr. Talmage Nap.   Review of Systems     Objective:   Physical Exam Neck is supple without JVD thyromegaly or carotid bruits. Chest is clear to auscultation. Cardiac exam regular rate and rhythm normal S1 and S2. Extremities without edema.       Assessment & Plan:  Malaise and fatigue in the mornings-etiology unclear  Arthralgias-likely related to osteoarthritis. Rule out tickborne disease  Hypertension-stable  Hyperlipidemia-lipid panel liver functions pending  Chronic anticoagulation therapy  Plan: Check CBC with differential, Lyme titer, RMSF titer. Told him more likely to have RMSF in [place] than Lyme disease. Fasting lipid panel & liver functions drawn. Will also check C. met, TSH, sedimentation rate , CCP, ANA, CCP.

## 2012-09-21 NOTE — Patient Instructions (Addendum)
Labs have been drawn and are pending. Continue same medications. Return in 6 months for physical exam.

## 2012-09-22 LAB — COMPREHENSIVE METABOLIC PANEL
ALT: 8 U/L (ref 0–53)
AST: 15 U/L (ref 0–37)
Albumin: 3.8 g/dL (ref 3.5–5.2)
Alkaline Phosphatase: 103 U/L (ref 39–117)
BUN: 17 mg/dL (ref 6–23)
CO2: 25 mEq/L (ref 19–32)
Calcium: 9.1 mg/dL (ref 8.4–10.5)
Chloride: 100 mEq/L (ref 96–112)
Creat: 0.71 mg/dL (ref 0.50–1.35)
Glucose, Bld: 100 mg/dL — ABNORMAL HIGH (ref 70–99)
Potassium: 4.5 mEq/L (ref 3.5–5.3)
Sodium: 141 mEq/L (ref 135–145)
Total Bilirubin: 0.4 mg/dL (ref 0.3–1.2)
Total Protein: 6 g/dL (ref 6.0–8.3)

## 2012-09-22 LAB — T4, FREE: Free T4: 1.18 ng/dL (ref 0.80–1.80)

## 2012-09-22 LAB — CBC WITH DIFFERENTIAL/PLATELET
Basophils Absolute: 0 10*3/uL (ref 0.0–0.1)
Basophils Relative: 0 % (ref 0–1)
Eosinophils Absolute: 0.1 10*3/uL (ref 0.0–0.7)
Eosinophils Relative: 1 % (ref 0–5)
HCT: 42.8 % (ref 39.0–52.0)
Hemoglobin: 14.2 g/dL (ref 13.0–17.0)
Lymphocytes Relative: 19 % (ref 12–46)
Lymphs Abs: 1.6 10*3/uL (ref 0.7–4.0)
MCH: 30.9 pg (ref 26.0–34.0)
MCHC: 33.2 g/dL (ref 30.0–36.0)
MCV: 93 fL (ref 78.0–100.0)
Monocytes Absolute: 0.6 10*3/uL (ref 0.1–1.0)
Monocytes Relative: 7 % (ref 3–12)
Neutro Abs: 6.1 10*3/uL (ref 1.7–7.7)
Neutrophils Relative %: 73 % (ref 43–77)
Platelets: 277 10*3/uL (ref 150–400)
RBC: 4.6 MIL/uL (ref 4.22–5.81)
RDW: 14.3 % (ref 11.5–15.5)
WBC: 8.3 10*3/uL (ref 4.0–10.5)

## 2012-09-22 LAB — HEMOGLOBIN A1C
Hgb A1c MFr Bld: 5.4 % (ref <5.7)
Mean Plasma Glucose: 108 mg/dL (ref <117)

## 2012-09-22 LAB — B. BURGDORFI ANTIBODIES: B burgdorferi Ab IgG+IgM: 0.19 {ISR}

## 2012-09-22 LAB — ROCKY MTN SPOTTED FVR ABS PNL(IGG+IGM)
RMSF IgG: 2.67 IV — ABNORMAL HIGH
RMSF IgM: 0.27 IV

## 2012-09-22 LAB — SEDIMENTATION RATE: Sed Rate: 7 mm/hr (ref 0–16)

## 2012-09-22 LAB — TSH: TSH: 0.792 u[IU]/mL (ref 0.350–4.500)

## 2012-09-25 LAB — CYCLIC CITRUL PEPTIDE ANTIBODY, IGG: Cyclic Citrullin Peptide Ab: <2 U/mL (ref 0.0–5.0)

## 2012-09-25 LAB — ANA: Anti Nuclear Antibody(ANA): NEGATIVE

## 2012-10-03 ENCOUNTER — Encounter: Payer: Self-pay | Admitting: Cardiovascular Disease

## 2012-10-03 ENCOUNTER — Other Ambulatory Visit: Payer: Medicare Other | Admitting: Internal Medicine

## 2012-10-04 ENCOUNTER — Encounter: Payer: Self-pay | Admitting: Cardiovascular Disease

## 2012-10-04 ENCOUNTER — Ambulatory Visit (INDEPENDENT_AMBULATORY_CARE_PROVIDER_SITE_OTHER): Payer: Medicare Other | Admitting: Cardiovascular Disease

## 2012-10-04 VITALS — BP 122/86 | HR 62 | Ht 74.0 in | Wt 293.9 lb

## 2012-10-04 DIAGNOSIS — I4892 Unspecified atrial flutter: Secondary | ICD-10-CM

## 2012-10-04 DIAGNOSIS — I2510 Atherosclerotic heart disease of native coronary artery without angina pectoris: Secondary | ICD-10-CM | POA: Diagnosis not present

## 2012-10-04 DIAGNOSIS — I259 Chronic ischemic heart disease, unspecified: Secondary | ICD-10-CM | POA: Diagnosis not present

## 2012-10-04 DIAGNOSIS — E785 Hyperlipidemia, unspecified: Secondary | ICD-10-CM | POA: Diagnosis not present

## 2012-10-04 NOTE — Assessment & Plan Note (Signed)
Patient denies chest pain or shortness of breath. He does have a history of PCI and stenting of his posterolateral branch by myself 03/18/00 with residual mild to moderate LAD and circumflex disease.

## 2012-10-04 NOTE — Assessment & Plan Note (Signed)
On Zetia and fish oil. He is statin intolerant.

## 2012-10-04 NOTE — Progress Notes (Signed)
10/04/2012 Mitchell [name]   1934/12/12  [id number]  Primary Physician Margaree Mackintosh, MD Primary Cardiologist: Runell Gess MD [name]   HPI:  The patient is a very pleasant 78 year old moderately overweight married Caucasian male, father of 2, grandfather to 4 grandchildren, who is accompanied by his wife today. I last saw him 7 months ago. He is active in the cattle business. He has a history of CAD status post remote PTI and stenting of his posterolateral branch by myself, March 18, 2000, with residual mild to moderate LAD and circumflex disease, and normal EF. He underwent TEE-guided DC cardioversion by Dr. Thurmon Fair, May 26, 2011, after being admitted with mild congestive heart failure secondary to atrial flutter with 2:1 block. He has been doing well since that time. He has obstructive sleep apnea, intolerant to CPAP. His last Myoview performed April 23, 2010, was nonischemic. An echocardiogram revealed an EF of 35% to 40% during his hospitalization with heart failure, and subsequently improved to greater than 55% by echocardiogram in May of this year. He is otherwise asymptomatic. He is on Xarelto for stroke prophylaxis secondary to his atrial flutter in the past.   Since I last saw him he apparently contracted [facility] spotted fever associated with Levaquin. His symptoms have resolved.      Current Outpatient Prescriptions  Medication Sig Dispense Refill  . Acetaminophen (TYLENOL EXTRA STRENGTH PO) Take by mouth as needed.      [name] amLODipine (NORVASC) 5 MG tablet Take 1 tablet (5 mg total) by mouth daily.  30 tablet  11  . atenolol (TENORMIN) 25 MG tablet Take 25 mg by mouth daily.       . calcium carbonate (OS-CAL) 600 MG TABS Take 600 mg by mouth 2 (two) times daily with a meal.        . fish oil-omega-3 fatty acids 1000 MG capsule Take 2 g by mouth daily.        . furosemide (LASIX) 40 MG tablet 40 mg. Tablet, only take half a tablet every other  day.  30 tablet  11  . meloxicam (MOBIC) 15 MG tablet TAKE 1 TABLET BY MOUTH DAILY.  30 tablet  5  . Polyvinyl Alcohol-Povidone (REFRESH OP) Apply to eye daily.      . Rivaroxaban (XARELTO) 20 MG TABS Take 20 mg by mouth daily.      . saw palmetto 160 MG capsule Take 160 mg by mouth 2 (two) times daily.        . traMADol (ULTRAM) 50 MG tablet Take 1 tablet (50 mg total) by mouth 2 (two) times daily.  60 tablet  5  . ZETIA 10 MG tablet TAKE 1 TABLET ONCE A DAY  30 tablet  11   No current facility-administered medications for this visit.    Allergies  Allergen Reactions  . Statins     History   Social History  . Marital Status: Single    Spouse Name: N/A    Number of Children: N/A  . Years of Education: N/A   Occupational History  . Not on file.   Social History Main Topics  . Smoking status: Never Smoker   . Smokeless tobacco: Former Neurosurgeon    Types: Chew    Quit date: 09/16/1972  . Alcohol Use: No  . Drug Use: No  . Sexually Active: Not Currently   Other Topics Concern  . Not on file   Social History Narrative  . No narrative on  file     Review of Systems: General: negative for chills, fever, night sweats or weight changes.  Cardiovascular: negative for chest pain, dyspnea on exertion, edema, orthopnea, palpitations, paroxysmal nocturnal dyspnea or shortness of breath Dermatological: negative for rash Respiratory: negative for cough or wheezing Urologic: negative for hematuria Abdominal: negative for nausea, vomiting, diarrhea, bright red blood per rectum, melena, or hematemesis Neurologic: negative for visual changes, syncope, or dizziness All other systems reviewed and are otherwise negative except as noted above.    Blood pressure 122/86, pulse 62, height 6\' 2"  (1.88 m), weight 293 lb 14.4 oz (133.312 kg).  General appearance: alert and no distress Neck: no adenopathy, no carotid bruit, no JVD, supple, symmetrical, trachea midline and thyroid not enlarged,  symmetric, no tenderness/mass/nodules Lungs: clear to auscultation bilaterally Heart: regular rate and rhythm, S1, S2 normal, no murmur, click, rub or gallop Abdomen: soft, non-tender; bowel sounds normal; no masses,  no organomegaly Pulses: 2+ and symmetric  EKG normal sinus rhythm at 62 without ST or T wave changes  ASSESSMENT AND PLAN:   Atrial flutter with rapid ventricular response Status post DC cardioversion by Dr. Royann Shivers  05/26/11 after being there with congestive heart failure atrial flutter and 21 block. He's remained in sinus rhythm on Xarelto.  Coronary disease,Bare metal stent of PLA of RCA 2001, 70% mid LAD, 50-60% prox. LCX, 60% PDA Patient denies chest pain or shortness of breath. He does have a history of PCI and stenting of his posterolateral branch by myself 03/18/00 with residual mild to moderate LAD and circumflex disease.  Hyperlipidemia On Zetia and fish oil. He is statin intolerant.      Runell Gess MD FACP,FACC,FAHA, [name] 10/04/2012 3:20 PM

## 2012-10-04 NOTE — Patient Instructions (Addendum)
Your physician wants you to follow-up in: 6 months with an extender and 12 months with Dr Berry. You will receive a reminder letter in the mail two months in advance. If you don't receive a letter, please call our office to schedule the follow-up appointment.  

## 2012-10-04 NOTE — Assessment & Plan Note (Signed)
Status post DC cardioversion by Dr. Royann Shivers  05/26/11 after being there with congestive heart failure atrial flutter and 21 block. He's remained in sinus rhythm on Xarelto.

## 2012-10-05 ENCOUNTER — Ambulatory Visit: Payer: Medicare Other | Admitting: Internal Medicine

## 2012-11-09 DIAGNOSIS — M25519 Pain in unspecified shoulder: Secondary | ICD-10-CM | POA: Diagnosis not present

## 2012-11-09 DIAGNOSIS — M19019 Primary osteoarthritis, unspecified shoulder: Secondary | ICD-10-CM | POA: Diagnosis not present

## 2012-11-16 ENCOUNTER — Encounter: Payer: Self-pay | Admitting: Internal Medicine

## 2012-11-16 ENCOUNTER — Ambulatory Visit (INDEPENDENT_AMBULATORY_CARE_PROVIDER_SITE_OTHER): Payer: Medicare Other | Admitting: Internal Medicine

## 2012-11-16 VITALS — BP 138/74 | HR 78 | Temp 98.0°F | Wt 285.0 lb

## 2012-11-16 DIAGNOSIS — N419 Inflammatory disease of prostate, unspecified: Secondary | ICD-10-CM

## 2012-11-16 DIAGNOSIS — R300 Dysuria: Secondary | ICD-10-CM

## 2012-11-16 DIAGNOSIS — N390 Urinary tract infection, site not specified: Secondary | ICD-10-CM

## 2012-11-16 DIAGNOSIS — M25611 Stiffness of right shoulder, not elsewhere classified: Secondary | ICD-10-CM

## 2012-11-16 DIAGNOSIS — M259 Joint disorder, unspecified: Secondary | ICD-10-CM

## 2012-11-16 LAB — POCT URINALYSIS DIPSTICK
Bilirubin, UA: NEGATIVE
Glucose, UA: NEGATIVE
Ketones, UA: NEGATIVE
Leukocytes, UA: NEGATIVE
Nitrite, UA: NEGATIVE
Protein, UA: NEGATIVE
Spec Grav, UA: 1.02
Urobilinogen, UA: NEGATIVE
pH, UA: 6.5

## 2012-11-16 MED ORDER — CEFTRIAXONE SODIUM 1 G IJ SOLR
1.0000 g | Freq: Once | INTRAMUSCULAR | Status: AC
  Administered 2012-11-16: 1 g via INTRAMUSCULAR

## 2012-11-16 MED ORDER — LEVOFLOXACIN 500 MG PO TABS: 500.0000 mg | ORAL_TABLET | Freq: Every day | ORAL | Status: DC

## 2012-11-16 NOTE — Patient Instructions (Addendum)
He had been given injection of Rocephin 1 g IM. Take Levaquin 500 milligrams daily for 21 days.

## 2012-11-16 NOTE — Progress Notes (Signed)
  Subjective:    Patient ID: Mitchell [name], male    DOB: [birth year], 78 y.o.   MRN: [id number]  HPI  In today with recurrent symptoms consistent with urinary tract infection/prostatitis. Has seen a bit of blood in his urine a few days ago he started on refill of Levaquin. Symptoms have improved. Urine specimen sent for culture today although he's already been on antibiotics for a few days. No fever or shaking chills. No nausea and vomiting. No back pain.  Tells me today a right shoulder replacement has been recommended to orthopedist in [place]. He's thinking about having the surgery this fall. Told him he would need clearance from cardiologist.    Review of Systems     Objective:   Physical Exam prostate is boggy. See urinalysis result. Has crepitus in right shoulder without pain with decreased range of motion        Assessment & Plan:  Right shoulder arthropathy  Prostatitis  Plan: 1 g IM Rocephin. Levaquin 500 milligrams daily for 21 days. If symptoms recur, he needs to return to urologist, Dr. Annabell Howells whom he is seen in the past.  25 minutes spent with patient and his wife explaining prostatitis and treatment plan, evaluating right shoulder, providing information to orthopedist

## 2012-11-18 ENCOUNTER — Other Ambulatory Visit: Payer: Self-pay | Admitting: Internal Medicine

## 2012-11-18 LAB — URINE CULTURE
Colony Count: NO GROWTH
Organism ID, Bacteria: NO GROWTH

## 2012-11-20 ENCOUNTER — Other Ambulatory Visit: Payer: Self-pay | Admitting: Internal Medicine

## 2012-11-20 NOTE — Telephone Encounter (Signed)
Please refill x 6 months 

## 2012-11-27 ENCOUNTER — Telehealth: Payer: Self-pay | Admitting: *Deleted

## 2012-11-27 NOTE — Telephone Encounter (Signed)
[name] walked in with a surgical clearance request.  Dr Allyson Sabal reviewed the chart.  He wants Mr Mitchell [name] to have a myoview prior to giving him clearance for surgery.  I called patient and left a message for him to call me back.

## 2012-11-28 NOTE — Telephone Encounter (Signed)
Mitchell [name] returned my call 11-28-12.  She was made aware of the need for a lexiscan myoview for surgical clearance.  She commented that Mitchell [name] is ready to schedule the surgery or the myoview at this time.  She will call me back when he is ready to proceed.

## 2012-12-12 DIAGNOSIS — Z96649 Presence of unspecified artificial hip joint: Secondary | ICD-10-CM | POA: Diagnosis not present

## 2012-12-12 DIAGNOSIS — Z471 Aftercare following joint replacement surgery: Secondary | ICD-10-CM | POA: Diagnosis not present

## 2012-12-20 ENCOUNTER — Other Ambulatory Visit: Payer: Self-pay | Admitting: Internal Medicine

## 2012-12-26 DIAGNOSIS — H2510 Age-related nuclear cataract, unspecified eye: Secondary | ICD-10-CM | POA: Diagnosis not present

## 2013-01-11 ENCOUNTER — Other Ambulatory Visit: Payer: Self-pay | Admitting: Cardiovascular Disease

## 2013-01-12 NOTE — Telephone Encounter (Signed)
Rx was sent to pharmacy electronically. 

## 2013-01-18 ENCOUNTER — Other Ambulatory Visit: Payer: Self-pay | Admitting: Cardiovascular Disease

## 2013-01-29 ENCOUNTER — Other Ambulatory Visit: Payer: Self-pay | Admitting: *Deleted

## 2013-01-29 MED ORDER — AMLODIPINE BESYLATE 5 MG PO TABS: 5.0000 mg | ORAL_TABLET | Freq: Every day | ORAL | Status: DC

## 2013-02-12 DIAGNOSIS — Z23 Encounter for immunization: Secondary | ICD-10-CM | POA: Diagnosis not present

## 2013-03-05 ENCOUNTER — Telehealth: Payer: Self-pay | Admitting: Internal Medicine

## 2013-03-05 NOTE — Telephone Encounter (Signed)
Approximately 2-3 weeks ago, wife says patient was having urinary frequency and dysuria. She had a prescription on hand for Levaquin which we keep her him on a regular basis and he started taking it daily for 21 days. This past weekend he was off the medication but began having recurrent dysuria with some slight blood noted. He has seen Dr. Annabell Howells, urologist, in the past and we have arranged for patient to see him tomorrow.

## 2013-03-06 DIAGNOSIS — K8020 Calculus of gallbladder without cholecystitis without obstruction: Secondary | ICD-10-CM | POA: Diagnosis not present

## 2013-03-06 DIAGNOSIS — N281 Cyst of kidney, acquired: Secondary | ICD-10-CM | POA: Diagnosis not present

## 2013-03-06 DIAGNOSIS — R300 Dysuria: Secondary | ICD-10-CM | POA: Diagnosis not present

## 2013-03-06 DIAGNOSIS — N4340 Spermatocele of epididymis, unspecified: Secondary | ICD-10-CM | POA: Diagnosis not present

## 2013-03-06 DIAGNOSIS — R310 Gross hematuria: Secondary | ICD-10-CM | POA: Diagnosis not present

## 2013-03-06 DIAGNOSIS — K7689 Other specified diseases of liver: Secondary | ICD-10-CM | POA: Diagnosis not present

## 2013-03-13 DIAGNOSIS — R300 Dysuria: Secondary | ICD-10-CM | POA: Diagnosis not present

## 2013-03-13 DIAGNOSIS — R310 Gross hematuria: Secondary | ICD-10-CM | POA: Diagnosis not present

## 2013-03-13 DIAGNOSIS — N37 Urethral disorders in diseases classified elsewhere: Secondary | ICD-10-CM | POA: Diagnosis not present

## 2013-03-13 DIAGNOSIS — D443 Neoplasm of uncertain behavior of pituitary gland: Secondary | ICD-10-CM | POA: Diagnosis not present

## 2013-03-14 DIAGNOSIS — E236 Other disorders of pituitary gland: Secondary | ICD-10-CM | POA: Diagnosis not present

## 2013-03-14 DIAGNOSIS — M818 Other osteoporosis without current pathological fracture: Secondary | ICD-10-CM | POA: Diagnosis not present

## 2013-03-14 DIAGNOSIS — D443 Neoplasm of uncertain behavior of pituitary gland: Secondary | ICD-10-CM | POA: Diagnosis not present

## 2013-03-14 DIAGNOSIS — E291 Testicular hypofunction: Secondary | ICD-10-CM | POA: Diagnosis not present

## 2013-04-03 ENCOUNTER — Other Ambulatory Visit: Payer: Medicare Other | Admitting: Internal Medicine

## 2013-04-03 DIAGNOSIS — N400 Enlarged prostate without lower urinary tract symptoms: Secondary | ICD-10-CM | POA: Diagnosis not present

## 2013-04-03 DIAGNOSIS — E785 Hyperlipidemia, unspecified: Secondary | ICD-10-CM | POA: Diagnosis not present

## 2013-04-03 DIAGNOSIS — I10 Essential (primary) hypertension: Secondary | ICD-10-CM

## 2013-04-03 LAB — COMPREHENSIVE METABOLIC PANEL
ALT: 13 U/L (ref 0–53)
AST: 17 U/L (ref 0–37)
Albumin: 3.9 g/dL (ref 3.5–5.2)
Alkaline Phosphatase: 84 U/L (ref 39–117)
BUN: 15 mg/dL (ref 6–23)
CO2: 34 mEq/L — ABNORMAL HIGH (ref 19–32)
Calcium: 9.1 mg/dL (ref 8.4–10.5)
Chloride: 101 mEq/L (ref 96–112)
Creat: 0.72 mg/dL (ref 0.50–1.35)
Glucose, Bld: 106 mg/dL — ABNORMAL HIGH (ref 70–99)
Potassium: 4.7 mEq/L (ref 3.5–5.3)
Sodium: 138 mEq/L (ref 135–145)
Total Bilirubin: 0.5 mg/dL (ref 0.3–1.2)
Total Protein: 6.1 g/dL (ref 6.0–8.3)

## 2013-04-03 LAB — CBC WITH DIFFERENTIAL/PLATELET
Basophils Absolute: 0 10*3/uL (ref 0.0–0.1)
Basophils Relative: 0 % (ref 0–1)
Eosinophils Absolute: 0.1 10*3/uL (ref 0.0–0.7)
Eosinophils Relative: 1 % (ref 0–5)
HCT: 43.3 % (ref 39.0–52.0)
Hemoglobin: 14.5 g/dL (ref 13.0–17.0)
Lymphocytes Relative: 25 % (ref 12–46)
Lymphs Abs: 2.2 10*3/uL (ref 0.7–4.0)
MCH: 31.1 pg (ref 26.0–34.0)
MCHC: 33.5 g/dL (ref 30.0–36.0)
MCV: 92.9 fL (ref 78.0–100.0)
Monocytes Absolute: 0.7 10*3/uL (ref 0.1–1.0)
Monocytes Relative: 8 % (ref 3–12)
Neutro Abs: 6 10*3/uL (ref 1.7–7.7)
Neutrophils Relative %: 66 % (ref 43–77)
Platelets: 247 10*3/uL (ref 150–400)
RBC: 4.66 MIL/uL (ref 4.22–5.81)
RDW: 13.9 % (ref 11.5–15.5)
WBC: 9 10*3/uL (ref 4.0–10.5)

## 2013-04-03 LAB — LIPID PANEL
Cholesterol: 171 mg/dL (ref 0–200)
HDL: 43 mg/dL (ref >39)
LDL Cholesterol: 111 mg/dL — ABNORMAL HIGH (ref 0–99)
Total CHOL/HDL Ratio: 4 Ratio
Triglycerides: 84 mg/dL (ref <150)
VLDL: 17 mg/dL (ref 0–40)

## 2013-04-04 LAB — PSA: PSA: 0.66 ng/mL (ref <=4.00)

## 2013-04-05 ENCOUNTER — Ambulatory Visit: Payer: Medicare Other | Admitting: Cardiovascular Disease

## 2013-04-05 ENCOUNTER — Ambulatory Visit (INDEPENDENT_AMBULATORY_CARE_PROVIDER_SITE_OTHER): Payer: Medicare Other | Admitting: Internal Medicine

## 2013-04-05 ENCOUNTER — Encounter: Payer: Self-pay | Admitting: Internal Medicine

## 2013-04-05 VITALS — BP 142/82 | HR 64 | Temp 98.5°F | Ht 73.0 in | Wt 272.0 lb

## 2013-04-05 DIAGNOSIS — Z87898 Personal history of other specified conditions: Secondary | ICD-10-CM

## 2013-04-05 DIAGNOSIS — Z8679 Personal history of other diseases of the circulatory system: Secondary | ICD-10-CM

## 2013-04-05 DIAGNOSIS — E229 Hyperfunction of pituitary gland, unspecified: Secondary | ICD-10-CM

## 2013-04-05 DIAGNOSIS — Z7901 Long term (current) use of anticoagulants: Secondary | ICD-10-CM

## 2013-04-05 DIAGNOSIS — IMO0001 Reserved for inherently not codable concepts without codable children: Secondary | ICD-10-CM

## 2013-04-05 DIAGNOSIS — Z8709 Personal history of other diseases of the respiratory system: Secondary | ICD-10-CM

## 2013-04-05 DIAGNOSIS — I10 Essential (primary) hypertension: Secondary | ICD-10-CM | POA: Diagnosis not present

## 2013-04-05 DIAGNOSIS — G8929 Other chronic pain: Secondary | ICD-10-CM

## 2013-04-05 DIAGNOSIS — Z8669 Personal history of other diseases of the nervous system and sense organs: Secondary | ICD-10-CM

## 2013-04-05 DIAGNOSIS — N400 Enlarged prostate without lower urinary tract symptoms: Secondary | ICD-10-CM

## 2013-04-05 DIAGNOSIS — Z9861 Coronary angioplasty status: Secondary | ICD-10-CM | POA: Diagnosis not present

## 2013-04-05 DIAGNOSIS — Z0000 Encounter for general adult medical examination without abnormal findings: Secondary | ICD-10-CM | POA: Diagnosis not present

## 2013-04-05 DIAGNOSIS — I2510 Atherosclerotic heart disease of native coronary artery without angina pectoris: Secondary | ICD-10-CM

## 2013-04-05 DIAGNOSIS — D126 Benign neoplasm of colon, unspecified: Secondary | ICD-10-CM

## 2013-04-05 DIAGNOSIS — D352 Benign neoplasm of pituitary gland: Secondary | ICD-10-CM

## 2013-04-05 DIAGNOSIS — Z955 Presence of coronary angioplasty implant and graft: Secondary | ICD-10-CM

## 2013-04-05 DIAGNOSIS — N529 Male erectile dysfunction, unspecified: Secondary | ICD-10-CM

## 2013-04-05 DIAGNOSIS — Z85820 Personal history of malignant melanoma of skin: Secondary | ICD-10-CM

## 2013-04-05 LAB — POCT URINALYSIS DIPSTICK
Bilirubin, UA: NEGATIVE
Blood, UA: NEGATIVE
Glucose, UA: NEGATIVE
Ketones, UA: NEGATIVE
Leukocytes, UA: NEGATIVE
Nitrite, UA: NEGATIVE
Protein, UA: NEGATIVE
Spec Grav, UA: 1.015
Urobilinogen, UA: 0.2
pH, UA: 6.5

## 2013-04-10 ENCOUNTER — Ambulatory Visit: Payer: Medicare Other | Admitting: Cardiology

## 2013-04-25 ENCOUNTER — Encounter: Payer: Self-pay | Admitting: Cardiology

## 2013-04-25 ENCOUNTER — Ambulatory Visit (INDEPENDENT_AMBULATORY_CARE_PROVIDER_SITE_OTHER): Payer: Medicare Other | Admitting: Cardiology

## 2013-04-25 VITALS — BP 130/70 | HR 65 | Ht 73.0 in | Wt 299.0 lb

## 2013-04-25 DIAGNOSIS — Z7901 Long term (current) use of anticoagulants: Secondary | ICD-10-CM | POA: Diagnosis not present

## 2013-04-25 DIAGNOSIS — E785 Hyperlipidemia, unspecified: Secondary | ICD-10-CM

## 2013-04-25 DIAGNOSIS — I2510 Atherosclerotic heart disease of native coronary artery without angina pectoris: Secondary | ICD-10-CM

## 2013-04-25 DIAGNOSIS — I10 Essential (primary) hypertension: Secondary | ICD-10-CM | POA: Diagnosis not present

## 2013-04-25 DIAGNOSIS — M4800 Spinal stenosis, site unspecified: Secondary | ICD-10-CM

## 2013-04-25 DIAGNOSIS — G4730 Sleep apnea, unspecified: Secondary | ICD-10-CM

## 2013-04-25 DIAGNOSIS — I259 Chronic ischemic heart disease, unspecified: Secondary | ICD-10-CM | POA: Diagnosis not present

## 2013-04-25 DIAGNOSIS — I4892 Unspecified atrial flutter: Secondary | ICD-10-CM

## 2013-04-25 DIAGNOSIS — I519 Heart disease, unspecified: Secondary | ICD-10-CM

## 2013-04-25 NOTE — Assessment & Plan Note (Signed)
Pt asked about coming off Xarelto

## 2013-04-25 NOTE — Assessment & Plan Note (Signed)
No palpitations or documented arrythmia since.

## 2013-04-25 NOTE — Patient Instructions (Signed)
Your physician recommends that you schedule a follow-up appointment in: 6 months Your physician has recommended that you wear a holter monitor. Holter monitors are medical devices that record the heart's electrical activity. Doctors most often use these monitors to diagnose arrhythmias. Arrhythmias are problems with the speed or rhythm of the heartbeat. The monitor is a small, portable device. You can wear one while you do your normal daily activities. This is usually used to diagnose what is causing palpitations/syncope (passing out).

## 2013-04-25 NOTE — Assessment & Plan Note (Signed)
Lost 17 lbs last year

## 2013-04-25 NOTE — Assessment & Plan Note (Signed)
Controlled.  

## 2013-04-25 NOTE — Assessment & Plan Note (Signed)
Last nuc. Study 04/22/11 low risk for suble inferior ischemia. No angina 

## 2013-04-25 NOTE — Assessment & Plan Note (Signed)
He has intermittent leg weakness.

## 2013-04-25 NOTE — Assessment & Plan Note (Signed)
LDL 110 Dec 2014. He is statin intol

## 2013-04-25 NOTE — Progress Notes (Signed)
04/25/2013 Mitchell [name]   1934/12/31  [id number]  Primary Physicia Elby Showers, MD Primary Cardiologist: Dr Gwenlyn Found  HPI:  Mitchell [name] is here for a 6 month check up. He is followed by Dr Gwenlyn Found. He has a history of CAD, s/p PCI Nov 2001. His last Myoview was low risk Jan 2012. He has PAF and had a TEE/CV Feb 2013. He has not had recurrent PAF that we know of. When in AF his EF was 35-40%, but an echo in May 2013 when in NSR showed his EF to be 55%. His LA is 4.4cm. He is on Xarelto.           He has done well from a cardiac standpoint. 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 He has spinal stenosis and has back pain and leg pain but is still able to get around pretty well. He is obese but has lost some wgt this past year. He has sleep apnea but is C-pap intol.    Current Outpatient Prescriptions  Medication Sig Dispense Refill  . Acetaminophen (TYLENOL EXTRA STRENGTH PO) Take by mouth as needed.      [name] amLODipine (NORVASC) 5 MG tablet Take 1 tablet (5 mg total) by mouth daily.  30 tablet  11  . atenolol (TENORMIN) 25 MG tablet Take 1 tablet (25 mg total) by mouth daily.  30 tablet  5  . calcium carbonate (OS-CAL) 600 MG TABS Take 600 mg by mouth 2 (two) times daily with a meal.        . fish oil-omega-3 fatty acids 1000 MG capsule Take 2 g by mouth daily.        . furosemide (LASIX) 40 MG tablet 40 mg. Tablet, only take half a tablet every other day.  30 tablet  11  . meloxicam (MOBIC) 15 MG tablet TAKE 1 TABLET BY MOUTH DAILY.  30 tablet  5  . polyethylene glycol powder (GLYCOLAX/MIRALAX) powder Take 1 Container by mouth once.      . saw palmetto 160 MG capsule Take 160 mg by mouth 2 (two) times daily.        . traMADol (ULTRAM) 50 MG tablet TAKE 1 TABLET TWICE A DAY  60 tablet  5  . XARELTO 20 MG TABS tablet TAKE 1 TABLET BY MOUTH ONCE DAILY  30 tablet  6  . ZETIA 10 MG tablet TAKE 1 TABLET ONCE A DAY  30 tablet  11   No current  facility-administered medications for this visit.    Allergies  Allergen Reactions  . Statins     History   Social History  . Marital Status: Single    Spouse Name: N/A    Number of Children: N/A  . Years of Education: N/A   Occupational History  . Not on file.   Social History Main Topics  . Smoking status: Never Smoker   . Smokeless tobacco: Former Systems developer    Types: [place] date: 09/16/1972  . Alcohol Use: No  . Drug Use: No  . Sexual Activity: Not Currently   Other Topics Concern  . Not on file   Social History Narrative  . No narrative on file     Review of Systems: General: negative for chills, fever, night sweats or weight changes.  Cardiovascular: negative for chest pain, dyspnea on exertion, edema, orthopnea, palpitations, paroxysmal nocturnal dyspnea or shortness of breath Dermatological: negative for rash  Respiratory: negative for cough or wheezing Urologic: negative for hematuria Abdominal: negative for nausea, vomiting, diarrhea, bright red blood per rectum, melena, or hematemesis Neurologic: negative for visual changes, syncope, or dizziness All other systems reviewed and are otherwise negative except as noted above.    Blood pressure 130/70, pulse 65, height 6\' 1"  (1.854 m), weight 299 lb (135.626 kg).  General appearance: alert, cooperative, no distress and morbidly obese Lungs: clear to auscultation bilaterally Heart: regular rate and rhythm Extremities: no edema  EKG NSR without acute changes  ASSESSMENT AND PLAN:   CAD- BMS to PLA 2001, 70% mid LAD, 50-60% prox. LCX, 60% PDA- Low risk Myoview Jan 2012 Last nuc. Study 04/22/11 low risk for suble inferior ischemia. No angina  Atrial flutter with RVR- s/p TEE CV Feb 2013 No palpitations or documented arrythmia since.  Anticoagulation adequate, xarelto Pt asked about coming off Xarelto  HTN (hypertension), elevated on admit Controlled  Hyperlipidemia LDL 110 Dec 2014. He is statin  intol  Spinal stenosis He has intermittent leg weakness.   Sleep apnea, intolerant to cpap .  LV dysfunction, EF 45% in AF- >55% May 2013 in NSR .  Morbid obesity Lost 17 lbs last year   PLAN  He and his wife asked about stopping Xarelto. She is afraid he will fall and have some type of bleeding complication. I discussed this with Dr Gwenlyn Found who came in to see the pt as well. We will get a 30 day monitor. If he has no AF then we will stop his Xarelto.   Mitchell [name] KPA-C 04/25/2013 4:09 PM

## 2013-04-26 ENCOUNTER — Encounter: Payer: Self-pay | Admitting: Cardiology

## 2013-04-29 DIAGNOSIS — I4892 Unspecified atrial flutter: Secondary | ICD-10-CM | POA: Diagnosis not present

## 2013-05-07 ENCOUNTER — Other Ambulatory Visit: Payer: Self-pay

## 2013-05-07 MED ORDER — LEVALBUTEROL TARTRATE 45 MCG/ACT IN AERO: 2.0000 | INHALATION_SPRAY | RESPIRATORY_TRACT | Status: DC | PRN

## 2013-05-09 ENCOUNTER — Emergency Department (HOSPITAL_COMMUNITY): Payer: Medicare Other

## 2013-05-09 ENCOUNTER — Encounter (HOSPITAL_COMMUNITY): Payer: Self-pay | Admitting: Emergency Medicine

## 2013-05-09 ENCOUNTER — Emergency Department (HOSPITAL_COMMUNITY)
Admission: EM | Admit: 2013-05-09 | Discharge: 2013-05-10 | Disposition: A | Discharge status: Home / Self Care | Payer: Medicare Other | Attending: Emergency Medicine | Admitting: Emergency Medicine

## 2013-05-09 DIAGNOSIS — J45909 Unspecified asthma, uncomplicated: Secondary | ICD-10-CM | POA: Insufficient documentation

## 2013-05-09 DIAGNOSIS — Z85820 Personal history of malignant melanoma of skin: Secondary | ICD-10-CM | POA: Insufficient documentation

## 2013-05-09 DIAGNOSIS — Z860100 Personal history of colon polyps, unspecified: Secondary | ICD-10-CM | POA: Insufficient documentation

## 2013-05-09 DIAGNOSIS — W1809XA Striking against other object with subsequent fall, initial encounter: Secondary | ICD-10-CM | POA: Insufficient documentation

## 2013-05-09 DIAGNOSIS — Y9389 Activity, other specified: Secondary | ICD-10-CM | POA: Insufficient documentation

## 2013-05-09 DIAGNOSIS — Z79899 Other long term (current) drug therapy: Secondary | ICD-10-CM | POA: Diagnosis not present

## 2013-05-09 DIAGNOSIS — I2510 Atherosclerotic heart disease of native coronary artery without angina pectoris: Secondary | ICD-10-CM | POA: Insufficient documentation

## 2013-05-09 DIAGNOSIS — S0083XA Contusion of other part of head, initial encounter: Secondary | ICD-10-CM

## 2013-05-09 DIAGNOSIS — S20219A Contusion of unspecified front wall of thorax, initial encounter: Secondary | ICD-10-CM | POA: Insufficient documentation

## 2013-05-09 DIAGNOSIS — Y9289 Other specified places as the place of occurrence of the external cause: Secondary | ICD-10-CM | POA: Insufficient documentation

## 2013-05-09 DIAGNOSIS — I4891 Unspecified atrial fibrillation: Secondary | ICD-10-CM | POA: Insufficient documentation

## 2013-05-09 DIAGNOSIS — S0003XA Contusion of scalp, initial encounter: Secondary | ICD-10-CM | POA: Diagnosis not present

## 2013-05-09 DIAGNOSIS — R079 Chest pain, unspecified: Secondary | ICD-10-CM | POA: Diagnosis not present

## 2013-05-09 DIAGNOSIS — S298XXA Other specified injuries of thorax, initial encounter: Secondary | ICD-10-CM | POA: Diagnosis not present

## 2013-05-09 DIAGNOSIS — Z9861 Coronary angioplasty status: Secondary | ICD-10-CM | POA: Insufficient documentation

## 2013-05-09 DIAGNOSIS — S1093XA Contusion of unspecified part of neck, initial encounter: Secondary | ICD-10-CM | POA: Diagnosis not present

## 2013-05-09 DIAGNOSIS — W010XXA Fall on same level from slipping, tripping and stumbling without subsequent striking against object, initial encounter: Secondary | ICD-10-CM | POA: Insufficient documentation

## 2013-05-09 DIAGNOSIS — I5023 Acute on chronic systolic (congestive) heart failure: Secondary | ICD-10-CM | POA: Insufficient documentation

## 2013-05-09 DIAGNOSIS — Z9889 Other specified postprocedural states: Secondary | ICD-10-CM | POA: Insufficient documentation

## 2013-05-09 DIAGNOSIS — W19XXXA Unspecified fall, initial encounter: Secondary | ICD-10-CM

## 2013-05-09 DIAGNOSIS — Z8701 Personal history of pneumonia (recurrent): Secondary | ICD-10-CM | POA: Diagnosis not present

## 2013-05-09 DIAGNOSIS — I10 Essential (primary) hypertension: Secondary | ICD-10-CM | POA: Diagnosis not present

## 2013-05-09 DIAGNOSIS — IMO0002 Reserved for concepts with insufficient information to code with codable children: Secondary | ICD-10-CM | POA: Diagnosis not present

## 2013-05-09 NOTE — ED Provider Notes (Signed)
CSN: [id number]     Arrival date & time 05/09/13  2039 History   First MD Initiated Contact with Patient 05/09/13 2227     Chief Complaint  Patient presents with  . Fall   (Consider location/radiation/quality/duration/timing/severity/associated sxs/prior Treatment) Patient is a 79 y.o. male presenting with fall. The history is provided by the patient. No language interpreter was used.  Fall This is a new problem. The current episode started today. Associated symptoms include chest pain. Pertinent negatives include no abdominal pain, chills, fever, headaches, nausea, neck pain or vomiting. Associated symptoms comments: He tripped over a cement parking block, landing right chest on the block, and head and right shoulder on the cement ground. No LOC, subsequent nausea or vomiting. He has been ambulatory since the fall. No abdominal, neck or hip pain. He denies visual changes or headache. .    Past Medical History  Diagnosis Date  . Hypertension   . Coronary artery disease     2D ECHO, 08/24/2011 - EF >55%; NUCLEAR STRESS TEST, 04/21/2010 - mild ischemia in Basal Inferolateral and Mid Inferolateral regions, post-stress EF 56%,   . Asthma   . Arthritis   . Melanoma     l neck  . Adenomatous polyp   . Shortness of breath   . Sleep apnea   . Prostatitis   . Dysrhythmia     atrial fibrilation  . Pneumonia   . Spinal stenosis   . Atrial flutter with rapid ventricular response 05/25/2011  . SOB (shortness of breath), secondary to SOB 05/25/2011  . Anticoagulation adequate, xarelto 05/25/2011  . LV dysfunction, EF by ECHO 40-45% 04/29/11 05/25/2011  . CHF, acute, mild secondary to EF 40-45% an a. flutter with RVR 05/25/2011  . Acute on chronic systolic heart failure, usually asymptomatic but with tachycardia HF became acute now resolved 05/26/2011   Past Surgical History  Procedure Laterality Date  . Appendectomy    . Hernia repair      umbilical  . Joint replacement      left knee  . Joint  replacement      left hip  . Lumbar laminectomy/decompression microdiscectomy    . Back surgery    . Coronary angioplasty    . Tee without cardioversion  05/26/2011    Procedure: TRANSESOPHAGEAL ECHOCARDIOGRAM (TEE);  Surgeon: Sanda Klein, MD;  Location: [facility] ENDOSCOPY;  Service: Cardiovascular;  Laterality: N/A;  . Cardioversion  05/26/2011    Procedure: CARDIOVERSION;  Surgeon: Sanda Klein, MD;  Location: [facility] ENDOSCOPY;  Service: Cardiovascular;  Laterality: N/A;  . Cardiac catheterization  03/18/2000    PDA between the first PLA branch stented with a 3x8 Guidant Penta stent resulting in reduction of a 99% stenosis to 0% residual   Family History  Problem Relation Age of Onset  . Heart failure Mother 39  . Heart failure Father 18  . Cancer Paternal Grandfather    History  Substance Use Topics  . Smoking status: Never Smoker   . Smokeless tobacco: Former Systems developer    Types: [place] date: 09/16/1972  . Alcohol Use: No    Review of Systems  Constitutional: Negative for fever and chills.  HENT: Negative.  Negative for nosebleeds.   Eyes: Negative for pain and visual disturbance.  Respiratory: Negative.  Negative for shortness of breath.   Cardiovascular: Positive for chest pain.  Gastrointestinal: Negative.  Negative for nausea, vomiting and abdominal pain.  Musculoskeletal: Negative.  Negative for back pain and neck pain.  Skin: Negative.   Neurological: Negative.  Negative for syncope and headaches.  Psychiatric/Behavioral: Negative for confusion.    Allergies  Statins  Home Medications   Current Outpatient Rx  Name  Route  Sig  Dispense  Refill  . Acetaminophen (TYLENOL EXTRA STRENGTH PO)   Oral   Take 1,000 mg by mouth every 6 (six) hours as needed (pain).          [name] amLODipine (NORVASC) 5 MG tablet   Oral   Take 5 mg by mouth daily.         [name] atenolol (TENORMIN) 25 MG tablet   Oral   Take 25 mg by mouth daily.         . calcium carbonate (OS-CAL) 600  MG TABS   Oral   Take 600 mg by mouth 2 (two) times daily with a meal.           . carboxymethylcellulose (REFRESH PLUS) 0.5 % SOLN   Both Eyes   Place 1 drop into both eyes 3 (three) times daily as needed (dry eyes).         . ezetimibe (ZETIA) 10 MG tablet   Oral   Take 10 mg by mouth daily.         . fish oil-omega-3 fatty acids 1000 MG capsule   Oral   Take 2 g by mouth daily.           . furosemide (LASIX) 40 MG tablet   Oral   Take 20 mg by mouth every other day.         . levalbuterol (XOPENEX HFA) 45 MCG/ACT inhaler   Inhalation   Inhale 2 puffs into the lungs every 4 (four) hours as needed for wheezing.         . meloxicam (MOBIC) 15 MG tablet   Oral   Take 15 mg by mouth daily.         . polyethylene glycol (MIRALAX / GLYCOLAX) packet   Oral   Take 17 g by mouth daily.         . Rivaroxaban (XARELTO) 20 MG TABS tablet   Oral   Take 20 mg by mouth daily.         . saw palmetto 160 MG capsule   Oral   Take 160 mg by mouth 2 (two) times daily.           . traMADol (ULTRAM) 50 MG tablet   Oral   Take 50 mg by mouth 2 (two) times daily.          BP 136/89  Pulse 62  Temp(Src) 98.3 F (36.8 C) (Oral)  Resp 18  Ht 6\' 2"  (1.88 m)  Wt 295 lb (133.811 kg)  BMI 37.86 kg/m2  SpO2 96% Physical Exam  Constitutional: He is oriented to person, place, and time. He appears well-developed and well-nourished.  HENT:  Head: Normocephalic.  Neck: Normal range of motion. Neck supple.  Cardiovascular: Normal rate and regular rhythm.   Pulmonary/Chest: Effort normal and breath sounds normal. He exhibits tenderness.  No chest wall bruising or discoloration. Tender to right upper chest wall around to back that is mild.   Abdominal: Soft. Bowel sounds are normal. There is no tenderness. There is no rebound and no guarding.  Musculoskeletal: Normal range of motion.  No midline or paraspinal cervical, thoracic or lumbar tenderness. FROM all  extremities. No pelvic tenderness or pain with movement.   Neurological: He is alert and  oriented to person, place, and time.  Skin: Skin is warm and dry. No rash noted.  Psychiatric: He has a normal mood and affect.    ED Course  Procedures (including critical care time) Labs Review Labs Reviewed - No data to display Imaging Review No results found.  EKG Interpretation   None     Dg Ribs Unilateral W/chest Right  05/09/2013   CLINICAL DATA:  Status post fall, with right anterior mid rib pain.  EXAM: RIGHT RIBS AND CHEST - 3+ VIEW  COMPARISON:  Chest radiograph performed 05/25/2011  FINDINGS: No displaced rib fractures are seen.  The lungs are well-aerated. Mild left basilar opacity likely reflects atelectasis. There is no evidence of pleural effusion or pneumothorax.  The cardiomediastinal silhouette is within normal limits.  IMPRESSION: No displaced rib fracture seen. Mild left basilar airspace opacity likely reflects atelectasis; lungs otherwise clear.   Electronically Signed   By: Garald Balding M.D.   On: 05/09/2013 23:57   Ct Head Wo Contrast  05/10/2013   CLINICAL DATA:  Fall today with abrasions to right-sided head.  EXAM: CT HEAD WITHOUT CONTRAST  TECHNIQUE: Contiguous axial images were obtained from the base of the skull through the vertex without intravenous contrast.  COMPARISON:  MR HEAD WO/W CM dated 06/24/2005  FINDINGS: Sinuses/Soft tissues: No definite soft tissue swelling. Clear paranasal sinuses and mastoid air cells.  Intracranial: Periventricular white matter hypoattenuation which is increased since the prior exam and favored to be due to advanced small vessel ischemic change. This is slightly greater on the right. Carotid and bilateral vertebral atherosclerosis. No mass lesion, hemorrhage, hydrocephalus, acute infarct, intra-axial, or extra-axial fluid collection.  IMPRESSION: 1. No acute or posttraumatic deformity identified. 2. Progressive periventricular white matter  hypoattenuation, likely secondary to small vessel ischemic change.   Electronically Signed   By: Abigail Miyamoto M.D.   On: 05/10/2013 00:11   MDM  No diagnosis found. 1. Fall 2. Chest wall contusion 3. Facial contusion  He remains alert and oriented, NAD, without significant pain. No pain with breathing or SOB. X-rays without significant abnormality. He appears stable for discharge home.     Dewaine Oats, PA-C 05/10/13 0102

## 2013-05-09 NOTE — ED Notes (Signed)
Pt was leaving church tonight and tripped on the curb and fell. Pt says he hit his right ribs and his face. Small abrasions to his head on the right side, bleeding controlled.

## 2013-05-09 NOTE — ED Notes (Signed)
Per pt report: pt slipped on the curb tonight and landed on on right arm and chest before hitting the side of the head.  Family witnessed fall and denied and LOC.  Pt on Xarelto. Pt has spinal stenosis and has issues his right foot. Pt a/o x 4.  Skin warm and dry. Pt has a skin tear of right arm and skin tear on temple of right side of head.  Pt is being monitored to rule out heart dysthymias.

## 2013-05-10 NOTE — Discharge Instructions (Signed)
TAKE YOUR XARELTO WHEN YOU GET HOME AND CONTINUE REGULAR DOSE TOMORROW AT THE REGULAR TIME. CONTINUE YOUR OTHER MEDICATIONS AS USUAL. FOLLOW UP WITH YOUR DOCTOR FOR RECHECK IN 1-2 DAYS. RETURN HERE AS NEEDED  Cryotherapy Cryotherapy means treatment with cold. Ice or gel packs can be used to reduce both pain and swelling. Ice is the most helpful within the first 24 to 48 hours after an injury or flareup from overusing a muscle or joint. Sprains, strains, spasms, burning pain, shooting pain, and aches can all be eased with ice. Ice can also be used when recovering from surgery. Ice is effective, has very few side effects, and is safe for most people to use. PRECAUTIONS  Ice is not a safe treatment option for people with:  Raynaud's phenomenon. This is a condition affecting small blood vessels in the extremities. Exposure to cold may cause your problems to return.  Cold hypersensitivity. There are many forms of cold hypersensitivity, including:  Cold urticaria. Red, itchy hives appear on the skin when the tissues begin to warm after being iced.  Cold erythema. This is a red, itchy rash caused by exposure to cold.  Cold hemoglobinuria. Red blood cells break down when the tissues begin to warm after being iced. The hemoglobin that carry oxygen are passed into the urine because they cannot combine with blood proteins fast enough.  Numbness or altered sensitivity in the area being iced. If you have any of the following conditions, do not use ice until you have discussed cryotherapy with your caregiver:  Heart conditions, such as arrhythmia, angina, or chronic heart disease.  High blood pressure.  Healing wounds or open skin in the area being iced.  Current infections.  Rheumatoid arthritis.  Poor circulation.  Diabetes. Ice slows the blood flow in the region it is applied. This is beneficial when trying to stop inflamed tissues from spreading irritating chemicals to surrounding tissues.  However, if you expose your skin to cold temperatures for too long or without the proper protection, you can damage your skin or nerves. Watch for signs of skin damage due to cold. HOME CARE INSTRUCTIONS Follow these tips to use ice and cold packs safely.  Place a dry or damp towel between the ice and skin. A damp towel will cool the skin more quickly, so you may need to shorten the time that the ice is used.  For a more rapid response, add gentle compression to the ice.  Ice for no more than 10 to 20 minutes at a time. The bonier the area you are icing, the less time it will take to get the benefits of ice.  Check your skin after 5 minutes to make sure there are no signs of a poor response to cold or skin damage.  Rest 20 minutes or more in between uses.  Once your skin is numb, you can end your treatment. You can test numbness by very lightly touching your skin. The touch should be so light that you do not see the skin dimple from the pressure of your fingertip. When using ice, most people will feel these normal sensations in this order: cold, burning, aching, and numbness.  Do not use ice on someone who cannot communicate their responses to pain, such as small children or people with dementia. HOW TO MAKE AN ICE PACK Ice packs are the most common way to use ice therapy. Other methods include ice massage, ice baths, and cryo-sprays. Muscle creams that cause a cold, tingly feeling  do not offer the same benefits that ice offers and should not be used as a substitute unless recommended by your caregiver. To make an ice pack, do one of the following:  Place crushed ice or a bag of frozen vegetables in a sealable plastic bag. Squeeze out the excess air. Place this bag inside another plastic bag. Slide the bag into a pillowcase or place a damp towel between your skin and the bag.  Mix 3 parts water with 1 part rubbing alcohol. Freeze the mixture in a sealable plastic bag. When you remove the  mixture from the freezer, it will be slushy. Squeeze out the excess air. Place this bag inside another plastic bag. Slide the bag into a pillowcase or place a damp towel between your skin and the bag. SEEK MEDICAL CARE IF:  You develop white spots on your skin. This may give the skin a blotchy (mottled) appearance.  Your skin turns blue or pale.  Your skin becomes waxy or hard.  Your swelling gets worse. MAKE SURE YOU:   Understand these instructions.  Will watch your condition.  Will get help right away if you are not doing well or get worse. Document Released: 11/30/2010 Document Revised: 06/28/2011 Document Reviewed: 11/30/2010 [name] Patient Information 2014 [place], [place]. Facial or Scalp Contusion A facial or scalp contusion is a deep bruise on the face or head. Injuries to the face and head generally cause a lot of swelling, especially around the eyes. Contusions are the result of an injury that caused bleeding under the skin. The contusion may turn blue, purple, or yellow. Minor injuries will give you a painless contusion, but more severe contusions may stay painful and swollen for a few weeks.  CAUSES  A facial or scalp contusion is caused by a blunt injury or trauma to the face or head area.  SIGNS AND SYMPTOMS   Swelling of the injured area.   Discoloration of the injured area.   Tenderness, soreness, or pain in the injured area.  DIAGNOSIS  The diagnosis can be made by taking a medical history and doing a physical exam. An X-ray exam, CT scan, or MRI may be needed to determine if there are any associated injuries, such as broken bones (fractures). TREATMENT  Often, the best treatment for a facial or scalp contusion is applying cold compresses to the injured area. Over-the-counter medicines may also be recommended for pain control.  HOME CARE INSTRUCTIONS   Only take over-the-counter or prescription medicines as directed by your health care provider.   Apply  ice to the injured area.   Put ice in a plastic bag.   Place a towel between your skin and the bag.   Leave the ice on for 20 minutes, 2 3 times a day.  SEEK MEDICAL CARE IF:  You have bite problems.   You have pain with chewing.   You are concerned about facial defects. SEEK IMMEDIATE MEDICAL CARE IF:  You have severe pain or a headache that is not relieved by medicine.   You have unusual sleepiness, confusion, or personality changes.   You throw up (vomit).   You have a persistent nosebleed.   You have double vision or blurred vision.   You have fluid drainage from your nose or ear.   You have difficulty walking or using your arms or legs.  MAKE SURE YOU:   Understand these instructions.  Will watch your condition.  Will get help right away if you are not doing  well or get worse. Document Released: 05/13/2004 Document Revised: 01/24/2013 Document Reviewed: 11/16/2012 [facility] Patient Information 2014 [place], [place]. Chest Contusion A chest contusion is a deep bruise on your chest area. Contusions are the result of an injury that caused bleeding under the skin. A chest contusion may involve bruising of the skin, muscles, or ribs. The contusion may turn blue, purple, or yellow. Minor injuries will give you a painless contusion, but more severe contusions may stay painful and swollen for a few weeks. CAUSES  A contusion is usually caused by a blow, trauma, or direct force to an area of the body. SYMPTOMS   Swelling and redness of the injured area.  Discoloration of the injured area.  Tenderness and soreness of the injured area.  Pain. DIAGNOSIS  The diagnosis can be made by taking a history and performing a physical exam. An X-ray, CT scan, or MRI may be needed to determine if there were any associated injuries, such as broken bones (fractures) or internal injuries. TREATMENT  Often, the best treatment for a chest contusion is resting, icing, and  applying cold compresses to the injured area. Deep breathing exercises may be recommended to reduce the risk of pneumonia. Over-the-counter medicines may also be recommended for pain control. HOME CARE INSTRUCTIONS   Put ice on the injured area.  Put ice in a plastic bag.  Place a towel between your skin and the bag.  Leave the ice on for 15-20 minutes, 03-04 times a day.  Only take over-the-counter or prescription medicines as directed by your caregiver. Your caregiver may recommend avoiding anti-inflammatory medicines (aspirin, ibuprofen, and naproxen) for 48 hours because these medicines may increase bruising.  Rest the injured area.  Perform deep-breathing exercises as directed by your caregiver.  Stop smoking if you smoke.  Do not lift objects over 5 pounds (2.3 kg) for 3 days or longer if recommended by your caregiver. SEEK IMMEDIATE MEDICAL CARE IF:   You have increased bruising or swelling.  You have pain that is getting worse.  You have difficulty breathing.  You have dizziness, weakness, or fainting.  You have blood in your urine or stool.  You cough up or vomit blood.  Your swelling or pain is not relieved with medicines. MAKE SURE YOU:   Understand these instructions.  Will watch your condition.  Will get help right away if you are not doing well or get worse. Document Released: 12/29/2000 Document Revised: 12/29/2011 Document Reviewed: 09/27/2011 [facility] Patient Information 2014 [place].

## 2013-05-11 NOTE — ED Provider Notes (Signed)
Medical screening examination/treatment/procedure(s) were performed by non-physician practitioner and as supervising physician I was immediately available for consultation/collaboration.  EKG Interpretation   None        Jasper Riling. Alvino Chapel, MD 05/11/13 3086

## 2013-05-21 ENCOUNTER — Other Ambulatory Visit: Payer: Self-pay | Admitting: Internal Medicine

## 2013-05-23 DIAGNOSIS — D235 Other benign neoplasm of skin of trunk: Secondary | ICD-10-CM | POA: Diagnosis not present

## 2013-05-23 DIAGNOSIS — L570 Actinic keratosis: Secondary | ICD-10-CM | POA: Diagnosis not present

## 2013-05-23 DIAGNOSIS — Z85820 Personal history of malignant melanoma of skin: Secondary | ICD-10-CM | POA: Diagnosis not present

## 2013-05-31 ENCOUNTER — Other Ambulatory Visit: Payer: Self-pay | Admitting: *Deleted

## 2013-05-31 DIAGNOSIS — I4892 Unspecified atrial flutter: Secondary | ICD-10-CM

## 2013-06-07 ENCOUNTER — Telehealth: Payer: Self-pay | Admitting: Cardiovascular Disease

## 2013-06-07 MED ORDER — ASPIRIN EC 325 MG PO TBEC: 325.0000 mg | DELAYED_RELEASE_TABLET | Freq: Every day | ORAL | Status: DC

## 2013-06-07 MED ORDER — PANTOPRAZOLE SODIUM 40 MG PO TBEC: 40.0000 mg | DELAYED_RELEASE_TABLET | Freq: Every day | ORAL | Status: DC

## 2013-06-07 NOTE — Telephone Encounter (Signed)
Patient's wife notified of results.  RX sent to pharmacy (see result note for monitor)

## 2013-06-07 NOTE — Telephone Encounter (Signed)
If do not reach at home number please call at this number .[name]

## 2013-06-07 NOTE — Telephone Encounter (Signed)
Returning your call from yesterday,concerning monitor results.

## 2013-06-13 DIAGNOSIS — R300 Dysuria: Secondary | ICD-10-CM | POA: Diagnosis not present

## 2013-06-13 DIAGNOSIS — N37 Urethral disorders in diseases classified elsewhere: Secondary | ICD-10-CM | POA: Diagnosis not present

## 2013-06-13 DIAGNOSIS — R310 Gross hematuria: Secondary | ICD-10-CM | POA: Diagnosis not present

## 2013-06-19 ENCOUNTER — Other Ambulatory Visit: Payer: Self-pay | Admitting: Internal Medicine

## 2013-06-28 ENCOUNTER — Other Ambulatory Visit: Payer: Self-pay | Admitting: Cardiology

## 2013-06-29 ENCOUNTER — Encounter (HOSPITAL_COMMUNITY): Payer: Self-pay | Admitting: Emergency Medicine

## 2013-06-29 ENCOUNTER — Emergency Department (HOSPITAL_COMMUNITY)
Admission: EM | Admit: 2013-06-29 | Discharge: 2013-06-29 | Disposition: A | Discharge status: Home / Self Care | Payer: Medicare Other | Attending: Emergency Medicine | Admitting: Emergency Medicine

## 2013-06-29 ENCOUNTER — Emergency Department (HOSPITAL_COMMUNITY): Payer: Medicare Other

## 2013-06-29 DIAGNOSIS — Z860100 Personal history of colon polyps, unspecified: Secondary | ICD-10-CM | POA: Insufficient documentation

## 2013-06-29 DIAGNOSIS — Z791 Long term (current) use of non-steroidal anti-inflammatories (NSAID): Secondary | ICD-10-CM | POA: Diagnosis not present

## 2013-06-29 DIAGNOSIS — Z79899 Other long term (current) drug therapy: Secondary | ICD-10-CM | POA: Diagnosis not present

## 2013-06-29 DIAGNOSIS — I2510 Atherosclerotic heart disease of native coronary artery without angina pectoris: Secondary | ICD-10-CM | POA: Diagnosis not present

## 2013-06-29 DIAGNOSIS — I5023 Acute on chronic systolic (congestive) heart failure: Secondary | ICD-10-CM | POA: Diagnosis not present

## 2013-06-29 DIAGNOSIS — Z85820 Personal history of malignant melanoma of skin: Secondary | ICD-10-CM | POA: Insufficient documentation

## 2013-06-29 DIAGNOSIS — M129 Arthropathy, unspecified: Secondary | ICD-10-CM | POA: Insufficient documentation

## 2013-06-29 DIAGNOSIS — Z7982 Long term (current) use of aspirin: Secondary | ICD-10-CM | POA: Diagnosis not present

## 2013-06-29 DIAGNOSIS — I10 Essential (primary) hypertension: Secondary | ICD-10-CM | POA: Diagnosis not present

## 2013-06-29 DIAGNOSIS — R1031 Right lower quadrant pain: Secondary | ICD-10-CM

## 2013-06-29 DIAGNOSIS — K8020 Calculus of gallbladder without cholecystitis without obstruction: Secondary | ICD-10-CM | POA: Diagnosis not present

## 2013-06-29 DIAGNOSIS — Z87448 Personal history of other diseases of urinary system: Secondary | ICD-10-CM | POA: Diagnosis not present

## 2013-06-29 DIAGNOSIS — Z8701 Personal history of pneumonia (recurrent): Secondary | ICD-10-CM | POA: Diagnosis not present

## 2013-06-29 DIAGNOSIS — Z9861 Coronary angioplasty status: Secondary | ICD-10-CM | POA: Insufficient documentation

## 2013-06-29 DIAGNOSIS — Z9889 Other specified postprocedural states: Secondary | ICD-10-CM | POA: Diagnosis not present

## 2013-06-29 DIAGNOSIS — J45909 Unspecified asthma, uncomplicated: Secondary | ICD-10-CM | POA: Diagnosis not present

## 2013-06-29 LAB — COMPREHENSIVE METABOLIC PANEL
ALT: 10 U/L (ref 0–53)
AST: 17 U/L (ref 0–37)
Albumin: 4 g/dL (ref 3.5–5.2)
Alkaline Phosphatase: 111 U/L (ref 39–117)
BUN: 15 mg/dL (ref 6–23)
CO2: 29 mEq/L (ref 19–32)
Calcium: 9.5 mg/dL (ref 8.4–10.5)
Chloride: 97 mEq/L (ref 96–112)
Creatinine, Ser: 0.64 mg/dL (ref 0.50–1.35)
GFR calc Af Amer: >90 mL/min (ref >90)
GFR calc non Af Amer: >90 mL/min (ref >90)
Glucose, Bld: 115 mg/dL — ABNORMAL HIGH (ref 70–99)
Potassium: 4.8 mEq/L (ref 3.7–5.3)
Sodium: 137 mEq/L (ref 137–147)
Total Bilirubin: 0.4 mg/dL (ref 0.3–1.2)
Total Protein: 7.2 g/dL (ref 6.0–8.3)

## 2013-06-29 LAB — CBC WITH DIFFERENTIAL/PLATELET
Basophils Absolute: 0 10*3/uL (ref 0.0–0.1)
Basophils Relative: 0 % (ref 0–1)
Eosinophils Absolute: 0.1 10*3/uL (ref 0.0–0.7)
Eosinophils Relative: 1 % (ref 0–5)
HCT: 44.3 % (ref 39.0–52.0)
Hemoglobin: 15 g/dL (ref 13.0–17.0)
Lymphocytes Relative: 13 % (ref 12–46)
Lymphs Abs: 1.6 10*3/uL (ref 0.7–4.0)
MCH: 32 pg (ref 26.0–34.0)
MCHC: 33.9 g/dL (ref 30.0–36.0)
MCV: 94.5 fL (ref 78.0–100.0)
Monocytes Absolute: 0.8 10*3/uL (ref 0.1–1.0)
Monocytes Relative: 6 % (ref 3–12)
Neutro Abs: 9.8 10*3/uL — ABNORMAL HIGH (ref 1.7–7.7)
Neutrophils Relative %: 80 % — ABNORMAL HIGH (ref 43–77)
Platelets: 242 10*3/uL (ref 150–400)
RBC: 4.69 MIL/uL (ref 4.22–5.81)
RDW: 13.5 % (ref 11.5–15.5)
WBC: 12.2 10*3/uL — ABNORMAL HIGH (ref 4.0–10.5)

## 2013-06-29 LAB — URINALYSIS, ROUTINE W REFLEX MICROSCOPIC
Bilirubin Urine: NEGATIVE
Glucose, UA: NEGATIVE mg/dL
Hgb urine dipstick: NEGATIVE
Ketones, ur: NEGATIVE mg/dL
Leukocytes, UA: NEGATIVE
Nitrite: NEGATIVE
Protein, ur: NEGATIVE mg/dL
Specific Gravity, Urine: 1.021 (ref 1.005–1.030)
Urobilinogen, UA: 0.2 mg/dL (ref 0.0–1.0)
pH: 8 (ref 5.0–8.0)

## 2013-06-29 NOTE — Telephone Encounter (Signed)
Rx was sent to pharmacy electronically. 

## 2013-06-29 NOTE — ED Notes (Addendum)
Pt reports having an enlarged right testicle, which the patient states has been evaluated by his PCP. However he has had right sided groin/lower abdominal pain that radiates to his back. Pt reports that he has urethral strictures, which causes intermittent blood in his urine. Pt reports increased urinary burning. Pt is A/O x4, in NAD, and vitals are WDL.

## 2013-06-29 NOTE — Discharge Instructions (Signed)
Abdominal Pain, Adult °Many things can cause abdominal pain. Usually, abdominal pain is not caused by a disease and will improve without treatment. It can often be observed and treated at home. Your health care provider will do a physical exam and possibly order blood tests and X-rays to help determine the seriousness of your pain. However, in many cases, more time must pass before a clear cause of the pain can be found. Before that point, your health care provider may not know if you need more testing or further treatment. °HOME CARE INSTRUCTIONS  °Monitor your abdominal pain for any changes. The following actions may help to alleviate any discomfort you are experiencing: °· Only take over-the-counter or prescription medicines as directed by your health care provider. °· Do not take laxatives unless directed to do so by your health care provider. °· Try a clear liquid diet (broth, tea, or water) as directed by your health care provider. Slowly move to a bland diet as tolerated. °SEEK MEDICAL CARE IF: °· You have unexplained abdominal pain. °· You have abdominal pain associated with nausea or diarrhea. °· You have pain when you urinate or have a bowel movement. °· You experience abdominal pain that wakes you in the night. °· You have abdominal pain that is worsened or improved by eating food. °· You have abdominal pain that is worsened with eating fatty foods. °SEEK IMMEDIATE MEDICAL CARE IF:  °· Your pain does not go away within 2 hours. °· You have a fever. °· You keep throwing up (vomiting). °· Your pain is felt only in portions of the abdomen, such as the right side or the left lower portion of the abdomen. °· You pass bloody or black tarry stools. °MAKE SURE YOU: °· Understand these instructions.   °· Will watch your condition.   °· Will get help right away if you are not doing well or get worse.   °Document Released: 01/13/2005 Document Revised: 01/24/2013 Document Reviewed: 12/13/2012 °[employer]® Patient  Information ©2014 [name]. ° °

## 2013-06-30 LAB — URINE CULTURE
Colony Count: NO GROWTH
Culture: NO GROWTH

## 2013-07-03 DIAGNOSIS — N509 Disorder of male genital organs, unspecified: Secondary | ICD-10-CM | POA: Diagnosis not present

## 2013-07-03 NOTE — ED Provider Notes (Signed)
CSN: [id number]     Arrival date & time 06/29/13  1451 History   First MD Initiated Contact with Patient 06/29/13 1625     Chief Complaint  Patient presents with  . Groin Pain     (Consider location/radiation/quality/duration/timing/severity/associated sxs/prior Treatment) HPI  79 year old male with right lower quadrant pain which radiated to his right flank and right back. Severe. Onset of couple hours before arrival. Resolve spontaneously on any waiting room. Currently no symptoms. No fevers or chills. Nausea but the pain. No vomiting. Patient with chronic swelling to his right scrotum, not acutely changed. No acute urinary complaints. No fevers or chills. No vomiting. No diarrhea. No sick contacts.   Past Medical History  Diagnosis Date  . Hypertension   . Coronary artery disease     2D ECHO, 08/24/2011 - EF >55%; NUCLEAR STRESS TEST, 04/21/2010 - mild ischemia in Basal Inferolateral and Mid Inferolateral regions, post-stress EF 56%,   . Asthma   . Arthritis   . Melanoma     l neck  . Adenomatous polyp   . Shortness of breath   . Sleep apnea   . Prostatitis   . Dysrhythmia     atrial fibrilation  . Pneumonia   . Spinal stenosis   . Atrial flutter with rapid ventricular response 05/25/2011  . SOB (shortness of breath), secondary to SOB 05/25/2011  . Anticoagulation adequate, xarelto 05/25/2011  . LV dysfunction, EF by ECHO 40-45% 04/29/11 05/25/2011  . CHF, acute, mild secondary to EF 40-45% an a. flutter with RVR 05/25/2011  . Acute on chronic systolic heart failure, usually asymptomatic but with tachycardia HF became acute now resolved 05/26/2011   Past Surgical History  Procedure Laterality Date  . Appendectomy    . Hernia repair      umbilical  . Joint replacement      left knee  . Joint replacement      left hip  . Lumbar laminectomy/decompression microdiscectomy    . Back surgery    . Coronary angioplasty    . Tee without cardioversion  05/26/2011    Procedure: TRANSESOPHAGEAL  ECHOCARDIOGRAM (TEE);  Surgeon: Sanda Klein, MD;  Location: [facility] ENDOSCOPY;  Service: Cardiovascular;  Laterality: N/A;  . Cardioversion  05/26/2011    Procedure: CARDIOVERSION;  Surgeon: Sanda Klein, MD;  Location: [facility] ENDOSCOPY;  Service: Cardiovascular;  Laterality: N/A;  . Cardiac catheterization  03/18/2000    PDA between the first PLA branch stented with a 3x8 Guidant Penta stent resulting in reduction of a 99% stenosis to 0% residual   Family History  Problem Relation Age of Onset  . Heart failure Mother 38  . Heart failure Father 63  . Cancer Paternal Grandfather    History  Substance Use Topics  . Smoking status: Never Smoker   . Smokeless tobacco: Former Systems developer    Types: [place] date: 09/16/1972  . Alcohol Use: No    Review of Systems  All systems reviewed and negative, other than as noted in HPI.   Allergies  Statins  Home Medications   Current Outpatient Rx  Name  Route  Sig  Dispense  Refill  . Acetaminophen (TYLENOL EXTRA STRENGTH PO)   Oral   Take 1,000 mg by mouth every 6 (six) hours as needed (pain).          [name] amLODipine (NORVASC) 5 MG tablet   Oral   Take 5 mg by mouth daily.         [name]  aspirin EC 325 MG tablet   Oral   Take 1 tablet (325 mg total) by mouth daily.   30 tablet   0   . atenolol (TENORMIN) 25 MG tablet   Oral   Take 25 mg by mouth daily.         . calcium carbonate (OS-CAL) 600 MG TABS   Oral   Take 600 mg by mouth 2 (two) times daily with a meal.           . carboxymethylcellulose (REFRESH PLUS) 0.5 % SOLN   Both Eyes   Place 1 drop into both eyes 3 (three) times daily as needed (dry eyes).         . ezetimibe (ZETIA) 10 MG tablet   Oral   Take 10 mg by mouth daily.         . fish oil-omega-3 fatty acids 1000 MG capsule   Oral   Take 1 g by mouth 2 (two) times daily.          . furosemide (LASIX) 40 MG tablet   Oral   Take 0.5 tablets (20 mg total) by mouth every other day.   30 tablet   5   .  levalbuterol (XOPENEX HFA) 45 MCG/ACT inhaler   Inhalation   Inhale 2 puffs into the lungs every 4 (four) hours as needed for wheezing.         . meloxicam (MOBIC) 15 MG tablet   Oral   Take 15 mg by mouth daily.         [name] menthol-cetylpyridinium (CEPACOL) 3 MG lozenge   Oral   Take 1 lozenge by mouth as needed for sore throat (cough).         . pantoprazole (PROTONIX) 40 MG tablet   Oral   Take 1 tablet (40 mg total) by mouth daily.   30 tablet   11   . polyethylene glycol (MIRALAX / GLYCOLAX) packet   Oral   Take 17 g by mouth daily.         . saw palmetto 160 MG capsule   Oral   Take 160 mg by mouth 2 (two) times daily.           . traMADol (ULTRAM) 50 MG tablet   Oral   Take 50 mg by mouth 2 (two) times daily.         . halobetasol (ULTRAVATE) 0.05 % cream   Topical   Apply 1 application topically as needed (rash).           BP 182/87  Pulse 57  Temp(Src) 97.4 F (36.3 C) (Oral)  Resp 16  SpO2 94% Physical Exam  Nursing note and vitals reviewed. Constitutional: He appears well-developed and well-nourished. No distress.  HENT:  Head: Normocephalic and atraumatic.  Eyes: Conjunctivae are normal. Right eye exhibits no discharge. Left eye exhibits no discharge.  Neck: Neck supple.  Cardiovascular: Normal rate, regular rhythm and normal heart sounds.  Exam reveals no gallop and no friction rub.   No murmur heard. Pulmonary/Chest: Effort normal and breath sounds normal. No respiratory distress.  Abdominal: Soft. He exhibits no distension. There is no tenderness.  Genitourinary:  Mass right hemiscrotum, hydrocele?[name] No skin changes. Nontender. No hernia appreciated.  Musculoskeletal: He exhibits no edema and no tenderness.  Neurological: He is alert.  Skin: Skin is warm and dry.  Psychiatric: He has a normal mood and affect. His behavior is normal. Thought content normal.    ED  Course  Procedures (including critical care time) Labs Review Labs  Reviewed  URINALYSIS, ROUTINE W REFLEX MICROSCOPIC - Abnormal; Notable for the following:    APPearance CLOUDY (*)    All other components within normal limits  COMPREHENSIVE METABOLIC PANEL - Abnormal; Notable for the following:    Glucose, Bld 115 (*)    All other components within normal limits  CBC WITH DIFFERENTIAL - Abnormal; Notable for the following:    WBC 12.2 (*)    Neutrophils Relative % 80 (*)    Neutro Abs 9.8 (*)    All other components within normal limits  URINE CULTURE   Imaging Review No results found.  Ct Abdomen Pelvis Wo Contrast  06/29/2013   CLINICAL DATA:  Right groin/lower abdominal pain radiating to back, enlarged right testis, urethral strictures with intermittent hematuria.  EXAM: CT ABDOMEN AND PELVIS WITHOUT CONTRAST  TECHNIQUE: Multidetector CT imaging of the abdomen and pelvis was performed following the standard protocol without intravenous contrast.  COMPARISON:  None.  FINDINGS: Mild scarring versus atelectasis in the lingula (series 7/ image 1).  1.6 cm probable cyst in the posterior right hepatic dome (series 3/image 8).  Unenhanced spleen, pancreas, and left adrenal gland are within normal limits. 10 mm right adrenal adenoma (series 3/ image 22).  Layering gallstone (series 3/ image 23), without associated inflammatory changes. No intrahepatic or extrahepatic ductal dilatation.  3.0 cm lateral right lower pole renal cyst (series 3/ image 40). No renal calculi or hydronephrosis.  No evidence of bowel obstruction. Prior appendectomy. Colonic diverticulosis, without associated inflammatory changes.  Atherosclerotic calcifications of the abdominal aorta and branch vessels.  No abdominopelvic ascites.  No suspicious abdominopelvic lymphadenopathy.  Prostate is obscured by streak artifact from the patient's bilateral hip prostheses.  No ureteral or bladder calculi.  Bladder is partially obscured but otherwise within normal limits.  Atrophy of the bilateral psoas  muscles (series 3/ image 43).  No inguinal hernia is seen. Small fat containing right paramidline ventral hernia (series 3/image 48).  Bilateral total hip arthroplasties. Degenerative changes of the visualized thoracolumbar spine.  IMPRESSION: No inguinal hernia is seen. No CT findings to account for the patient's right groin pain.  No evidence of bowel obstruction.  Prior appendectomy.  Cholelithiasis, without associated inflammatory changes.  10 mm right adrenal adenoma.  Additional ancillary findings as above.   Electronically Signed   By: Julian Hy M.D.   On: 06/29/2013 17:15    EKG Interpretation None      MDM   Final diagnoses:  RLQ abdominal pain        Virgel Manifold, MD 07/03/13 2300

## 2013-07-06 ENCOUNTER — Ambulatory Visit (INDEPENDENT_AMBULATORY_CARE_PROVIDER_SITE_OTHER): Payer: Medicare Other | Admitting: Internal Medicine

## 2013-07-06 ENCOUNTER — Encounter: Payer: Self-pay | Admitting: Internal Medicine

## 2013-07-06 ENCOUNTER — Ambulatory Visit
Admission: RE | Admit: 2013-07-06 | Discharge: 2013-07-06 | Disposition: A | Discharge status: Home / Self Care | Payer: Medicare Other | Source: Ambulatory Visit | Attending: Internal Medicine | Admitting: Internal Medicine

## 2013-07-06 VITALS — BP 160/96 | HR 60 | Temp 99.2°F | Wt 290.0 lb

## 2013-07-06 DIAGNOSIS — B9789 Other viral agents as the cause of diseases classified elsewhere: Secondary | ICD-10-CM | POA: Diagnosis not present

## 2013-07-06 DIAGNOSIS — R110 Nausea: Secondary | ICD-10-CM

## 2013-07-06 DIAGNOSIS — J069 Acute upper respiratory infection, unspecified: Secondary | ICD-10-CM

## 2013-07-06 DIAGNOSIS — R5383 Other fatigue: Secondary | ICD-10-CM

## 2013-07-06 DIAGNOSIS — I2510 Atherosclerotic heart disease of native coronary artery without angina pectoris: Secondary | ICD-10-CM | POA: Diagnosis not present

## 2013-07-06 DIAGNOSIS — R5381 Other malaise: Secondary | ICD-10-CM | POA: Diagnosis not present

## 2013-07-06 DIAGNOSIS — B349 Viral infection, unspecified: Secondary | ICD-10-CM

## 2013-07-06 DIAGNOSIS — R112 Nausea with vomiting, unspecified: Secondary | ICD-10-CM | POA: Diagnosis not present

## 2013-07-06 LAB — CBC WITH DIFFERENTIAL/PLATELET
Basophils Absolute: 0 10*3/uL (ref 0.0–0.1)
Eosinophils Absolute: 0 10*3/uL (ref 0.0–0.7)
HCT: 44.3 % (ref 39.0–52.0)
Hemoglobin: 15.5 g/dL (ref 13.0–17.0)
Lymphocytes Relative: 9 % — ABNORMAL LOW (ref 12–46)
Lymphs Abs: 1.1 10*3/uL (ref 0.7–4.0)
MCH: 32.4 pg (ref 26.0–34.0)
MCHC: 35 g/dL (ref 30.0–36.0)
MCV: 92.5 fL (ref 78.0–100.0)
Monocytes Absolute: 0.5 10*3/uL (ref 0.1–1.0)
Monocytes Relative: 4 % (ref 3–12)
Neutro Abs: 11 10*3/uL — ABNORMAL HIGH (ref 1.7–7.7)
Neutrophils Relative %: 87 % — ABNORMAL HIGH (ref 43–77)
Platelets: 239 10*3/uL (ref 150–400)
RBC: 4.79 MIL/uL (ref 4.22–5.81)
RDW: 12.9 % (ref 11.5–15.5)
WBC: 12.6 10*3/uL — ABNORMAL HIGH (ref 4.0–10.5)

## 2013-07-06 LAB — COMPREHENSIVE METABOLIC PANEL
ALT: 20 U/L (ref 0–53)
AST: 28 U/L (ref 0–37)
Albumin: 4.2 g/dL (ref 3.5–5.2)
Alkaline Phosphatase: 109 U/L (ref 39–117)
BUN: 14 mg/dL (ref 6–23)
CO2: 29 mEq/L (ref 19–32)
Calcium: 9.4 mg/dL (ref 8.4–10.5)
Chloride: 94 mEq/L — ABNORMAL LOW (ref 96–112)
Creat: 0.6 mg/dL (ref 0.50–1.35)
Glucose, Bld: 132 mg/dL — ABNORMAL HIGH (ref 70–99)
Potassium: 4 mEq/L (ref 3.5–5.3)
Sodium: 136 mEq/L (ref 135–145)
Total Bilirubin: 0.5 mg/dL (ref 0.2–1.2)
Total Protein: 7.2 g/dL (ref 6.0–8.3)

## 2013-07-06 LAB — POCT URINALYSIS DIPSTICK
Bilirubin, UA: NEGATIVE
Blood, UA: NEGATIVE
Glucose, UA: NEGATIVE
Ketones, UA: NEGATIVE
Leukocytes, UA: NEGATIVE
Nitrite, UA: NEGATIVE
Protein, UA: NEGATIVE
Spec Grav, UA: 1.01
Urobilinogen, UA: NEGATIVE
pH, UA: 7.5

## 2013-07-06 MED ORDER — ONDANSETRON HCL 4 MG/2ML IJ SOLN
4.0000 mg | Freq: Once | INTRAMUSCULAR | Status: AC
  Administered 2013-07-06: 4 mg via INTRAMUSCULAR

## 2013-07-06 MED ORDER — CEFTRIAXONE SODIUM 1 G IJ SOLR
1.0000 g | Freq: Once | INTRAMUSCULAR | Status: AC
  Administered 2013-07-06: 1 g via INTRAMUSCULAR

## 2013-07-06 MED ORDER — LEVOFLOXACIN 500 MG PO TABS: 500.0000 mg | ORAL_TABLET | Freq: Every day | ORAL | Status: DC

## 2013-07-06 MED ORDER — PROMETHAZINE HCL 25 MG PO TABS: 25.0000 mg | ORAL_TABLET | Freq: Three times a day (TID) | ORAL | Status: DC | PRN

## 2013-07-06 NOTE — Patient Instructions (Addendum)
Take Levaquin and Phenergan as needed. Tylenol as needed for myalgias. Drink plenty of fluids. Call if symptoms worsen., his son

## 2013-07-06 NOTE — Progress Notes (Signed)
   Subjective:    Patient ID: Mitchell [name], male    DOB: February 27, 1935, 79 y.o.   MRN: [id number]  HPI Called March 13 with c/o of RLQ pain. Was directed to ED for evaluation. CT of abd was unremarkable. Had swollen right testicle. Follwed up in with Dr. Ralene Muskrat nurse practitioner on March 17. Patient had no tenderness of the testicles. No evidence of epididymitis. Ultrasound showed no epididymitis or portion of the testicle. We received a call from his wife earlier this afternoon that he was acutely weak with nausea and myalgias. Apparently has had some white sputum production. Slight shortness of breath. No hematuria. No dysuria. No diarrhea. Says he feels bloated. Urinalysis today is unremarkable. He is afebrile. He is weak and walking with a cane. Says he aches all over.     Review of Systems     Objective:   Physical Exam Coughing up white sputum. Pharynx is clear. TMs are clear. Neck is supple. Chest clear. Cardiac exam soft regular rate and rhythm. Abdomen no hepatosplenomegaly or masses appreciated. Some very mild nonspecific tenderness without rebound tenderness. Prostate is boggy. Right testicle is enlarged but not red or swollen. Given 4 mg IM Zofran in office. Sent for chest x-ray. Stat lab work drawn.         Assessment & Plan:  White blood cell count 12,600. Chest x-ray shows no pneumonia. C. met stable.   Impression:  Viral syndrome  Plan: 1 g IM Rocephin. Levaquin 500 milligrams daily for 10 days. Phenergan 25 mg every 4-6 hours when necessary nausea. Alternate Tylenol with tramadol for pain. Hold Protonix and Mobic. Encourage fluid intake. Rest at home. Call if symptoms worsen.

## 2013-07-08 LAB — URINE CULTURE
Colony Count: NO GROWTH
Organism ID, Bacteria: NO GROWTH

## 2013-07-09 ENCOUNTER — Telehealth: Payer: Self-pay

## 2013-07-10 ENCOUNTER — Telehealth: Payer: Self-pay | Admitting: Internal Medicine

## 2013-07-10 NOTE — Telephone Encounter (Signed)
Blood pressure has come down after taling Tenormin 2 hours ago. Wife wondering if stopping Xarelto caused these symptoms. I do not think so. However, family is concerned about his heart. Will see if cardiology can evealuate him this week.

## 2013-07-10 NOTE — Telephone Encounter (Signed)
2:45 p.m. Haven't heard from patient.....called and spoke with wife, Lelon Frohlich who advised Atenolol taken around 12 noon.  BP at 2pm was 164/96, HR 58.  Patient ate chicken noodle soup, 7-up and piece of lemon pound cake for lunch.  Patient still just resting in recliner.    Spoke with Dr. Renold Genta to advise; she would like for patient to see [facility] tomorrow (anyone who is available) to evaluate from a cardiac standpoint.  Spoke w/Billy @ [phone]; Dr. Gwenlyn Found will see patient in the a.m. (Wed. 3/25) @ 0845.  Notes are in EPIC.  Advised Dr. Renold Genta of this information.    Spoke wife @ home # to advise of appointment with Dr. Gwenlyn Found.  Wife verbalized understanding of instructions.  She'll take BP around 5pm and call us with one last reading this afternoon.  She wants to know if patient should take any BP meds tonight or not?  Advised to check BP and call us at 5pm and I'll advise at that time once we speak with Dr. Renold Genta.

## 2013-07-10 NOTE — Telephone Encounter (Signed)
Phone call from wife. He felt better yesterday and got out and drove around a bit. Today feels terrible again. Has dry mouth weakness but no shortness of breath. Blood pressure is 192/109 at 11 AM. Had amlodipine 5 mg at 8:30 AM. Heart rate is 59. Has not had Tenormin. Generally takes at night. Instructed patient to take Tenormin 25 mg male in call back the blood pressure in 2 hours. Denies chest pain. I thought on Friday, March 20 that he probably had viral syndrome. He needs to rest at home. If blood pressure remains elevated we will have them contact cardiologist. Daughter is a Animal nutritionist and has lots of questions.

## 2013-07-10 NOTE — Telephone Encounter (Signed)
Per Dr. Renold Genta; patient should NOT take any additional BP meds tonight.  Wife called @ 4:40 p.m.; patient's BP is 134/69, HR 61.  Patient's wife, Lelon Frohlich advised of Dr. Verlene Mayer instructions to hold BP med tonight and resume in the morning.  Dr. Gwenlyn Found appointment tomorrow, 3/25 @ 8:45 a.m.  Wife verbalized understanding of these instructions.

## 2013-07-11 ENCOUNTER — Encounter: Payer: Self-pay | Admitting: Cardiovascular Disease

## 2013-07-11 ENCOUNTER — Ambulatory Visit (INDEPENDENT_AMBULATORY_CARE_PROVIDER_SITE_OTHER): Payer: Medicare Other | Admitting: Cardiovascular Disease

## 2013-07-11 ENCOUNTER — Other Ambulatory Visit: Payer: Self-pay | Admitting: Cardiovascular Disease

## 2013-07-11 VITALS — BP 136/85 | HR 63 | Ht 73.0 in | Wt 290.1 lb

## 2013-07-11 DIAGNOSIS — G4730 Sleep apnea, unspecified: Secondary | ICD-10-CM | POA: Diagnosis not present

## 2013-07-11 DIAGNOSIS — I2510 Atherosclerotic heart disease of native coronary artery without angina pectoris: Secondary | ICD-10-CM

## 2013-07-11 DIAGNOSIS — E785 Hyperlipidemia, unspecified: Secondary | ICD-10-CM

## 2013-07-11 DIAGNOSIS — I519 Heart disease, unspecified: Secondary | ICD-10-CM

## 2013-07-11 DIAGNOSIS — I10 Essential (primary) hypertension: Secondary | ICD-10-CM | POA: Diagnosis not present

## 2013-07-11 DIAGNOSIS — I4892 Unspecified atrial flutter: Secondary | ICD-10-CM | POA: Diagnosis not present

## 2013-07-11 NOTE — Assessment & Plan Note (Signed)
History of CAD and LV dysfunction in the past which has since improved to an EF of 55%. He is on every other day Lasix. He has no peripheral edema or symptoms of congestive heart failure.

## 2013-07-11 NOTE — Assessment & Plan Note (Signed)
The patient had a negative event monitor  in February and as a result his oral anticoagulant was discontinued. He is only on aspirin at this time maintaining sinus rhythm

## 2013-07-11 NOTE — Assessment & Plan Note (Signed)
He has been on CPAP in the past but not in the last several years due to an episode that occurred while he was hospitalized. He has also lost a significant amount of weight and it's not clear whether he still has symptomatic sleep apnea. We discussed obtaining a followup sleep study however the patient expresses a desire not to wear a mask at this time

## 2013-07-11 NOTE — Progress Notes (Signed)
07/11/2013 Mitchell [name]   May 05, 1934  [id number]  Primary Physician Mitchell Showers, MD Primary Cardiologist: Mitchell Harp MD Mitchell [name]   HPI:  The patient is a very pleasant 79 year old moderately overweight married Caucasian male, father of 52, grandfather to 4 grandchildren, who is accompanied by his wife today. I last saw him 7 months ago. He is active in the cattle business. He has a history of CAD status post remote PTI and stenting of his posterolateral branch by myself, March 18, 2000, with residual mild to moderate LAD and circumflex disease, and normal EF. He underwent TEE-guided DC cardioversion by Dr. Sanda [name], May 26, 2011, after being admitted with mild congestive heart failure secondary to atrial flutter with 2:1 block. He has been doing well since that time. He has obstructive sleep apnea, intolerant to CPAP. His last Myoview performed April 23, 2010, was nonischemic. An echocardiogram revealed an EF of 35% to 40% during his hospitalization with heart failure, and subsequently improved to greater than 55% by echocardiogram in May of this year. He is otherwise asymptomatic. He was on Xarelto for paroxysmal atrial flutter which was discontinued after a negative event monitor.his blood pressure has been somewhat labile recently and he was referred back by his primary care physician, Mitchell [name] for further evaluation. He denies chest pain or shortness of breath. He does he look to Mitchell [name] in the office 04/25/13 and was doing well at that time. My last office visit with him on 10/04/12.   Current Outpatient Prescriptions  Medication Sig Dispense Refill  . Acetaminophen (TYLENOL EXTRA STRENGTH PO) Take 1,000 mg by mouth every 6 (six) hours as needed (pain).       [name] amLODipine (NORVASC) 5 MG tablet Take 5 mg by mouth daily.      [name] aspirin EC 325 MG tablet Take 1 tablet (325 mg total) by mouth daily.  30 tablet  0  . atenolol (TENORMIN) 25 MG tablet Take 25 mg by  mouth daily.      . calcium carbonate (OS-CAL) 600 MG TABS Take 600 mg by mouth 2 (two) times daily with a meal.        . carboxymethylcellulose (REFRESH PLUS) 0.5 % SOLN Place 1 drop into both eyes 3 (three) times daily as needed (dry eyes).      . ezetimibe (ZETIA) 10 MG tablet Take 10 mg by mouth daily.      . fish oil-omega-3 fatty acids 1000 MG capsule Take 1 g by mouth 2 (two) times daily.       . furosemide (LASIX) 40 MG tablet Take 0.5 tablets (20 mg total) by mouth every other day.  30 tablet  5  . halobetasol (ULTRAVATE) 0.05 % cream Apply 1 application topically as needed (rash).       [name] levalbuterol (XOPENEX HFA) 45 MCG/ACT inhaler Inhale 2 puffs into the lungs every 4 (four) hours as needed for wheezing.      [name] levofloxacin (LEVAQUIN) 500 MG tablet Take 1 tablet (500 mg total) by mouth daily.  10 tablet  0  . meloxicam (MOBIC) 15 MG tablet Take 15 mg by mouth daily.      [name] menthol-cetylpyridinium (CEPACOL) 3 MG lozenge Take 1 lozenge by mouth as needed for sore throat (cough).      . pantoprazole (PROTONIX) 40 MG tablet Take 1 tablet (40 mg total) by mouth daily.  30 tablet  11  . polyethylene glycol (MIRALAX / GLYCOLAX) packet Take 17  g by mouth daily.      . saw palmetto 160 MG capsule Take 160 mg by mouth 2 (two) times daily.        . traMADol (ULTRAM) 50 MG tablet Take 50 mg by mouth 2 (two) times daily.       No current facility-administered medications for this visit.    Allergies  Allergen Reactions  . Statins     History   Social History  . Marital Status: Single    Spouse Name: N/A    Number of Children: N/A  . Years of Education: N/A   Occupational History  . Not on file.   Social History Main Topics  . Smoking status: Never Smoker   . Smokeless tobacco: Former Systems developer    Types: [place] date: 09/16/1972  . Alcohol Use: No  . Drug Use: No  . Sexual Activity: Not Currently   Other Topics Concern  . Not on file   Social History Narrative  . No  narrative on file     Review of Systems: General: negative for chills, fever, night sweats or weight changes.  Cardiovascular: negative for chest pain, dyspnea on exertion, edema, orthopnea, palpitations, paroxysmal nocturnal dyspnea or shortness of breath Dermatological: negative for rash Respiratory: negative for cough or wheezing Urologic: negative for hematuria Abdominal: negative for nausea, vomiting, diarrhea, bright red blood per rectum, melena, or hematemesis Neurologic: negative for visual changes, syncope, or dizziness All other systems reviewed and are otherwise negative except as noted above.    Blood pressure 136/85, pulse 63, height 6\' 1"  (1.854 m), weight 290 lb 1.6 oz (131.588 kg).  General appearance: alert and no distress Neck: no adenopathy, no carotid bruit, no JVD, supple, symmetrical, trachea midline and thyroid not enlarged, symmetric, no tenderness/mass/nodules Lungs: clear to auscultation bilaterally Heart: regular rate and rhythm, S1, S2 normal, no murmur, click, rub or gallop Extremities: extremities normal, atraumatic, no cyanosis or edema  EKG normal sinus rhythm at 63 without ST or T wave changes  ASSESSMENT AND PLAN:   LV dysfunction, EF 45% in AF- >55% May 2013 in NSR History of CAD and LV dysfunction in the past which has since improved to an EF of 55%. He is on every other day Lasix. He has no peripheral edema or symptoms of congestive heart failure.  Sleep apnea, intolerant to cpap He has been on CPAP in the past but not in the last several years due to an episode that occurred while he was hospitalized. He has also lost a significant amount of weight and it's not clear whether he still has symptomatic sleep apnea. We discussed obtaining a followup sleep study however the patient expresses a desire not to wear a mask at this time  Atrial flutter with RVR- s/p TEE CV Feb 2013 The patient had a negative event monitor  in February and as a result his  oral anticoagulant was discontinued. He is only on aspirin at this time maintaining sinus rhythm  HTN (hypertension), elevated on admit His blood pressure has been labile of late. His medication dosing schedule has been adjusted as well. This morning his blood pressure is therapeutic on low-dose amlodipine, atenolol and every other day furosemide. His heart rate is as low as I am comfortable having it and therefore we'll not adjust dosing of atenolol. I'm going to have him keep a blood pressure log and will have him see Erasmo Downer back in one month for evaluation and medication adjustment. She  is hypertensive he may benefit from increasing his amlodipine and/or starting a low-dose ACE inhibitor.  Hyperlipidemia Statin intolerant on Zetia. His last lipid profile performed 04/03/13 revealed a glucose of 171, LDL of 111 and HDL of 43      Mitchell Harp MD [facility], [facility] 07/11/2013 9:44 AM

## 2013-07-11 NOTE — Patient Instructions (Signed)
Dr Gwenlyn Found would like for you to keep a log of your blood pressure daily.  Bring that information with you to your appt with Erasmo Downer (the pharmacist) in one month.    Your physician wants you to follow-up in: 3 months with Kerin Ransom and 6 months with Dr Gwenlyn Found.  You will receive a reminder letter in the mail two months in advance. If you don't receive a letter, please call our office to schedule the follow-up appointment.

## 2013-07-11 NOTE — Assessment & Plan Note (Signed)
Statin intolerant on Zetia. His last lipid profile performed 04/03/13 revealed a glucose of 171, LDL of 111 and HDL of 43

## 2013-07-11 NOTE — Assessment & Plan Note (Signed)
His blood pressure has been labile of late. His medication dosing schedule has been adjusted as well. This morning his blood pressure is therapeutic on low-dose amlodipine, atenolol and every other day furosemide. His heart rate is as low as I am comfortable having it and therefore we'll not adjust dosing of atenolol. I'm going to have him keep a blood pressure log and will have him see Mitchell [name] back in one month for evaluation and medication adjustment. She is hypertensive he may benefit from increasing his amlodipine and/or starting a low-dose ACE inhibitor.

## 2013-07-11 NOTE — Telephone Encounter (Signed)
Rx was sent to pharmacy electronically. 

## 2013-08-08 ENCOUNTER — Ambulatory Visit (INDEPENDENT_AMBULATORY_CARE_PROVIDER_SITE_OTHER): Payer: Medicare Other | Admitting: Pharmacist Clinician (PhC)/ Clinical Pharmacy Specialist

## 2013-08-08 VITALS — BP 142/84 | HR 68 | Ht 73.0 in | Wt 298.0 lb

## 2013-08-08 DIAGNOSIS — I2510 Atherosclerotic heart disease of native coronary artery without angina pectoris: Secondary | ICD-10-CM

## 2013-08-08 DIAGNOSIS — I10 Essential (primary) hypertension: Secondary | ICD-10-CM

## 2013-08-08 NOTE — Patient Instructions (Signed)
Your blood pressure today is 142/84  Check your blood pressure at home daily (if able) and keep record of the readings.  Take your BP meds as follows: continue with amlodipine and atenolol  Bring all of your meds, your BP cuff and your record of home blood pressures to your next appointment.  Exercise as you're able, try to walk approximately 30 minutes per day.  Keep salt intake to a minimum, especially watch canned and prepared boxed foods.  Eat more fresh fruits and vegetables and fewer canned items.  Avoid eating in fast food restaurants.    HOW TO TAKE YOUR BLOOD PRESSURE:   Rest 5 minutes before taking your blood pressure.    Don't smoke or drink caffeinated beverages for at least 30 minutes before.   Take your blood pressure before (not after) you eat.   Sit comfortably with your back supported and both feet on the floor (don't cross your legs).   Elevate your arm to heart level on a table or a desk.   Use the proper sized cuff. It should fit smoothly and snugly around your bare upper arm. There should be enough room to slip a fingertip under the cuff. The bottom edge of the cuff should be 1 inch above the crease of the elbow.   Ideally, take 3 measurements at one sitting and record the average.  If you notice that your blood pressure at home is always >150/90 or <120/60, please call the office  Start on the exercise bike for 5-10 minutes per day and build up to 30 minutes a day over the course of 1-2 months.  This can help reduce your blood pressure and blood sugar

## 2013-08-09 ENCOUNTER — Encounter: Payer: Self-pay | Admitting: Pharmacist Clinician (PhC)/ Clinical Pharmacy Specialist

## 2013-08-09 NOTE — Progress Notes (Signed)
08/09/2013 MICHALE EMMERICH Jul 02, 1934 [id number]   HPI:  Mitchell [name] is a 79 y.o. male patient of Dr. Gwenlyn Found, with a PMH below who presents today for a blood pressure check.  He saw Dr. Gwenlyn Found in March and was noted to have labile pressure, although was good at time of visit at 136/85.  His home readings have ranged from 110/68 to 162/99 in the mornings.  Evening readings are better, only going to a high of 145/70.  He currently takes amlodipine 5 mg and atenolol 25 mg, both in the mornings.  He has an Om[employer] meter that is only 83-64 years old.  He has it with him in the office to day for calibration.  Mitchell [name] is a fairly active man, still going to cattle auctions regularly.  He does not drink or use tobacco products, having quit >40 years ago.  He does drink coffee, but usually only 1 cup in the mornings.  His wife also takes medication for hypertension, so they do try to watch salt intake.  He doesn't do any regular exercise, but admits to having an exercise bike on the screen porch, just hasn't used it in several months.     Current Outpatient Prescriptions  Medication Sig Dispense Refill  . Acetaminophen (TYLENOL EXTRA STRENGTH PO) Take 1,000 mg by mouth every 6 (six) hours as needed (pain).       [name] amLODipine (NORVASC) 5 MG tablet Take 5 mg by mouth daily.      [name] aspirin EC 325 MG tablet Take 1 tablet (325 mg total) by mouth daily.  30 tablet  0  . atenolol (TENORMIN) 25 MG tablet TAKE 1 TABLET BY MOUTH DAILY.  30 tablet  11  . calcium carbonate (OS-CAL) 600 MG TABS Take 600 mg by mouth 2 (two) times daily with a meal.        . carboxymethylcellulose (REFRESH PLUS) 0.5 % SOLN Place 1 drop into both eyes 3 (three) times daily as needed (dry eyes).      . ezetimibe (ZETIA) 10 MG tablet Take 10 mg by mouth daily.      . fish oil-omega-3 fatty acids 1000 MG capsule Take 1 g by mouth 2 (two) times daily.       . furosemide (LASIX) 40 MG tablet Take 0.5 tablets (20 mg total) by mouth every  other day.  30 tablet  5  . halobetasol (ULTRAVATE) 0.05 % cream Apply 1 application topically as needed (rash).       [name] levalbuterol (XOPENEX HFA) 45 MCG/ACT inhaler Inhale 2 puffs into the lungs every 4 (four) hours as needed for wheezing.      . meloxicam (MOBIC) 15 MG tablet Take 15 mg by mouth daily.      [name] menthol-cetylpyridinium (CEPACOL) 3 MG lozenge Take 1 lozenge by mouth as needed for sore throat (cough).      . pantoprazole (PROTONIX) 40 MG tablet Take 1 tablet (40 mg total) by mouth daily.  30 tablet  11  . polyethylene glycol (MIRALAX / GLYCOLAX) packet Take 17 g by mouth daily.      . saw palmetto 160 MG capsule Take 160 mg by mouth 2 (two) times daily.        . traMADol (ULTRAM) 50 MG tablet Take 50 mg by mouth 2 (two) times daily.       No current facility-administered medications for this visit.    Allergies  Allergen Reactions  . Statins  Past Medical History  Diagnosis Date  . Hypertension   . Coronary artery disease     2D ECHO, 08/24/2011 - EF >55%; NUCLEAR STRESS TEST, 04/21/2010 - mild ischemia in Basal Inferolateral and Mid Inferolateral regions, post-stress EF 56%,   . Asthma   . Arthritis   . Melanoma     l neck  . Adenomatous polyp   . Shortness of breath   . Sleep apnea   . Prostatitis   . Dysrhythmia     atrial fibrilation  . Pneumonia   . Spinal stenosis   . Atrial flutter with rapid ventricular response 05/25/2011  . SOB (shortness of breath), secondary to SOB 05/25/2011  . Anticoagulation adequate, xarelto 05/25/2011  . LV dysfunction, EF by ECHO 40-45% 04/29/11 05/25/2011  . CHF, acute, mild secondary to EF 40-45% an a. flutter with RVR 05/25/2011  . Acute on chronic systolic heart failure, usually asymptomatic but with tachycardia HF became acute now resolved 05/26/2011    Blood pressure 142/84, pulse 68, height 6\' 1"  (1.854 m), weight 298 lb (135.172 kg). Standing 122/74   ASSESSMENT AND PLAN:  Today Mitchell [name] blood pressure is within the JNC-8  guidelines.  He does have a 20 point systolic drop when standing, although he doesn't complain of orthostatic symptoms.  We spoke at length about the need to continually monitor his diet for sodium, especially if he eats in restaurants and the importance of exercise in maintaining good blood pressure.  I encouraged him to use the exercise bike, starting with 5-10 minutes per day and increase to 30 minutes per day over the next month.   His home blood pressure machine was compared to our office reading and found to be accurate within 10 points.  He is to continue with daily home monitoring.  I have asked that he contact us if the home readings increase to consistently >150/90 or if he develops symptoms associated with orthostasis.    Tommy Medal PharmD [facility]

## 2013-09-23 NOTE — Patient Instructions (Signed)
Continue same medications and return in 6 months 

## 2013-09-23 NOTE — Progress Notes (Signed)
Subjective:    Patient ID: Mitchell [name], male    DOB: [birth year], 79 y.o.   MRN: [id number]  HPI 79 year old White Male in today for health maintenance and evaluation of medical issues. Has a history of morbid obesity. Has lost 16 pounds since December 2013. History of coronary artery disease, hypertension, hyperlipidemia, asthma, BPH, erectile dysfunction, low serum testosterone, adenomatous colon polyps, osteoarthritis status post multiple joint replacements, pituitary microadenoma, sleep apnea but does not tolerate CPAP, spinal stenosis. History of PAF and has been on chronic anticoagulation therapy. In 2013 had atrial flutter with rapid ventricular response requiring cardioversion now treated with Xarelto.  History bilateral knee replacements and bilateral hip replacements. Takes tramadol for pain.  Had adenomatous colon polyp March 2011. Colonoscopy done by Dr. Earle Gell.  History of melanoma left neck September 2000. History of herpes zoster left trunk March 2000. He sees Dr. Fernande Boyden for pituitary microadenoma with prolactinemia and hypogonadism. Dr. Beckey Rutter in [place] is his orthopedist. Dr. Dayton Martes is his dermatologist. Dr. Herbert Deaner is his ophthalmologist.  History of angioplasty September 2001 with bare-metal stent being placed to the right coronary artery.  Appendectomy 1958, right carpal tunnel release 1991, decompressive laminectomy L3-L4 and L4-L5 for spinal stenosis September 1995. Umbilical hernia repair 4193. Left lower lobe pneumonia 1996. Right DVT in ruptured Baker cyst requiring hospitalization February 1993.  History of intermittent hematuria which occurred prior to being on anticoagulation therapy. History of recurrent urinary infections from time to time.  Social history: Married with 2 adult children. Does not smoke. Self-employed cattleman. Does not consume alcohol.  Family history: Father died at age 47 of an MI. Mother died at age 68 of "old age". One  sister in good health.    Review of Systems  Constitutional: Negative.   HENT: Negative.   Eyes: Negative.   Respiratory:       History of asthma with respiratory infection  Cardiovascular:       History of atrial flutter, stent placement, chronic anticoagulation  Gastrointestinal:       History of adenomatous colon polyp  Endocrine:       Pituitary microadenoma followed by Dr. Chalmers Cater  Genitourinary:       Recurrent episodes of hematuria, history of BPH  Musculoskeletal:       Chronic musculoskeletal pain treated with tramadol  Hematological: Negative.   Psychiatric/Behavioral: Negative.        Objective:   Physical Exam  Vitals reviewed. Constitutional: He is oriented to person, place, and time. He appears well-developed and well-nourished. No distress.  HENT:  Head: Normocephalic and atraumatic.  Right Ear: External ear normal.  Left Ear: External ear normal.  Mouth/Throat: Oropharynx is clear and moist.  Eyes: Conjunctivae and EOM are normal. Pupils are equal, round, and reactive to light. Right eye exhibits no discharge. Left eye exhibits no discharge. No scleral icterus.  Neck: Neck supple. No JVD present. No thyromegaly present.  Cardiovascular: Normal rate, regular rhythm, normal heart sounds and intact distal pulses.   No murmur heard. Pulmonary/Chest: Effort normal and breath sounds normal. No respiratory distress. He has no rales.  Abdominal: Soft. Bowel sounds are normal. He exhibits no distension and no mass. There is no tenderness. There is no rebound and no guarding.  Genitourinary: Prostate normal.  Musculoskeletal: Normal range of motion. He exhibits no edema.  Lymphadenopathy:    He has no cervical adenopathy.  Neurological: He is alert and oriented to person, place, and time. He has normal  reflexes. No cranial nerve deficit. Coordination normal.  Skin: Skin is warm and dry. No rash noted. He is not diaphoretic.  Psychiatric: He has a normal mood and  affect. His behavior is normal. Judgment and thought content normal.          Assessment & Plan:  Essential hypertension  History of hyperlipidemia-statin intolerant tree was 80  History of coronary artery stent placement  History of melanoma  History of atrial flutter  History of chronic anticoagulation  Morbid obesity  Chronic musculoskeletal pain  Adenomatous colon polyp  History of asthma  Pituitary microadenoma with hyperprolactinemia  History of sleep apnea  Plan: Continue same medications and return in 6 months. Continue followup with cardiologist. Continue followup with endocrinologist. Recommend diet exercise and weight loss.    Subjective:   Patient presents for Medicare Annual/Subsequent preventive examination.  Review Past Medical/Family/Social:   Risk Factors  Current exercise habits:  Dietary issues discussed:   Cardiac risk factors:  Depression Screen  (Note: if answer to either of the following is "Yes", a more complete depression screening is indicated)   Over the past two weeks, have you felt down, depressed or hopeless? No  Over the past two weeks, have you felt little interest or pleasure in doing things? No Have you lost interest or pleasure in daily life? No Do you often feel hopeless? No Do you cry easily over simple problems? No   Activities of Daily Living  In your present state of health, do you have any difficulty performing the following activities?:   Driving? No  Managing money? No  Feeding yourself? No  Getting from bed to chair? No  Climbing a flight of stairs? No  Preparing food and eating?: No  Bathing or showering? No  Getting dressed: No  Getting to the toilet? No  Using the toilet:No  Moving around from place to place: No  In the past year have you fallen or had a near fall?:yes Are you sexually active? yes Do you have more than one partner? No   Hearing Difficulties: No  Do you often ask people to speak up  or repeat themselves? No  Do you experience ringing or noises in your ears? No  Do you have difficulty understanding soft or whispered voices? yes Do you feel that you have a problem with memory? No Do you often misplace items? No    Home Safety:  Do you have a smoke alarm at your residence? Yes Do you have grab bars in the bathroom? yes Do you have throw rugs in your house? no   Cognitive Testing  Alert? Yes Normal Appearance?Yes  Oriented to person? Yes Place? Yes  Time? Yes  Recall of three objects? Yes  Can perform simple calculations? Yes  Displays appropriate judgment?Yes  Can read the correct time from a watch face?Yes   List the Names of Other Physician/Practitioners you currently use:  See referral list for the physicians patient is currently seeing.   Dr. Cherylann Parr  Dr. Chalmers Cater- Endocrinology; Dr. Gwenlyn Found- Cardiology, Dr. Hall-Dermatology, Dr. Beckey Rutter , [place], orthopedist, Dr. Herbert Deaner is ophthalmologist  Review of Systems: See above   Objective:     General appearance: Appears stated age and  obese  Head: Normocephalic, without obvious abnormality, atraumatic  Eyes: conj clear, EOMi PEERLA  Ears: normal TM's and external ear canals both ears  Nose: Nares normal. Septum midline. Mucosa normal. No drainage or sinus tenderness.  Throat: lips, mucosa, and tongue normal; teeth and gums  normal  Neck: no adenopathy, no carotid bruit, no JVD, supple, symmetrical, trachea midline and thyroid not enlarged, symmetric, no tenderness/mass/nodules  No CVA tenderness.  Lungs: clear to auscultation bilaterally  Breasts: normal appearance, no masses or tenderness Heart: regular rate and rhythm, S1, S2 normal, no murmur, click, rub or gallop  Abdomen: soft, non-tender; bowel sounds normal; no masses, no organomegaly  Musculoskeletal: ROM normal in all joints, no crepitus, no deformity, Normal muscle strengthen. Back  is symmetric, no curvature. Skin: Skin color, texture,  turgor normal. No rashes or lesions  Lymph nodes: Cervical, supraclavicular, and axillary nodes normal.  Neurologic: CN 2 -12 Normal, Normal symmetric reflexes. Normal coordination and gait  Psych: Alert & Oriented x 3, Mood appear stable.    Assessment:    Annual wellness medicare exam   Plan:    During the course of the visit the patient was educated and counseled about appropriate screening and preventive services including:  Annual influenza immunization      Patient Instructions (the written plan) was given to the patient.  Medicare Attestation  I have personally reviewed:  The patient's medical and social history  Their use of alcohol, tobacco or illicit drugs  Their current medications and supplements  The patient's functional ability including ADLs,fall risks, home safety risks, cognitive, and hearing and visual impairment  Diet and physical activities  Evidence for depression or mood disorders  The patient's weight, height, BMI, and visual acuity have been recorded in the chart. I have made referrals, counseling, and provided education to the patient based on review of the above and I have provided the patient with a written personalized care plan for preventive services.

## 2013-10-04 ENCOUNTER — Encounter: Payer: Self-pay | Admitting: Cardiology

## 2013-10-04 ENCOUNTER — Telehealth: Payer: Self-pay | Admitting: Cardiology

## 2013-10-04 NOTE — Telephone Encounter (Signed)
Closed encounter °

## 2013-10-09 ENCOUNTER — Other Ambulatory Visit: Payer: Medicare Other | Admitting: Internal Medicine

## 2013-10-09 DIAGNOSIS — E119 Type 2 diabetes mellitus without complications: Secondary | ICD-10-CM | POA: Diagnosis not present

## 2013-10-09 DIAGNOSIS — Z79899 Other long term (current) drug therapy: Secondary | ICD-10-CM | POA: Diagnosis not present

## 2013-10-09 DIAGNOSIS — I10 Essential (primary) hypertension: Secondary | ICD-10-CM | POA: Diagnosis not present

## 2013-10-09 DIAGNOSIS — E785 Hyperlipidemia, unspecified: Secondary | ICD-10-CM | POA: Diagnosis not present

## 2013-10-09 LAB — HEPATIC FUNCTION PANEL
ALT: 12 U/L (ref 0–53)
AST: 17 U/L (ref 0–37)
Albumin: 3.9 g/dL (ref 3.5–5.2)
Alkaline Phosphatase: 96 U/L (ref 39–117)
Bilirubin, Direct: 0.1 mg/dL (ref 0.0–0.3)
Indirect Bilirubin: 0.4 mg/dL (ref 0.2–1.2)
Total Bilirubin: 0.5 mg/dL (ref 0.2–1.2)
Total Protein: 6 g/dL (ref 6.0–8.3)

## 2013-10-09 LAB — BASIC METABOLIC PANEL
BUN: 18 mg/dL (ref 6–23)
CO2: 32 mEq/L (ref 19–32)
Calcium: 9.1 mg/dL (ref 8.4–10.5)
Chloride: 101 mEq/L (ref 96–112)
Creat: 0.76 mg/dL (ref 0.50–1.35)
Glucose, Bld: 102 mg/dL — ABNORMAL HIGH (ref 70–99)
Potassium: 5 mEq/L (ref 3.5–5.3)
Sodium: 139 mEq/L (ref 135–145)

## 2013-10-09 LAB — LIPID PANEL
Cholesterol: 160 mg/dL (ref 0–200)
HDL: 37 mg/dL — ABNORMAL LOW (ref >39)
LDL Cholesterol: 102 mg/dL — ABNORMAL HIGH (ref 0–99)
Total CHOL/HDL Ratio: 4.3 Ratio
Triglycerides: 107 mg/dL (ref <150)
VLDL: 21 mg/dL (ref 0–40)

## 2013-10-09 LAB — HEMOGLOBIN A1C
Hgb A1c MFr Bld: 5.7 % — ABNORMAL HIGH (ref <5.7)
Mean Plasma Glucose: 117 mg/dL — ABNORMAL HIGH (ref <117)

## 2013-10-11 ENCOUNTER — Ambulatory Visit (INDEPENDENT_AMBULATORY_CARE_PROVIDER_SITE_OTHER): Payer: Medicare Other | Admitting: Internal Medicine

## 2013-10-11 ENCOUNTER — Encounter: Payer: Self-pay | Admitting: Internal Medicine

## 2013-10-11 VITALS — BP 126/84 | HR 68 | Temp 98.9°F | Wt 280.0 lb

## 2013-10-11 DIAGNOSIS — Z8679 Personal history of other diseases of the circulatory system: Secondary | ICD-10-CM

## 2013-10-11 DIAGNOSIS — Z9181 History of falling: Secondary | ICD-10-CM

## 2013-10-11 DIAGNOSIS — E785 Hyperlipidemia, unspecified: Secondary | ICD-10-CM | POA: Diagnosis not present

## 2013-10-11 DIAGNOSIS — I4891 Unspecified atrial fibrillation: Secondary | ICD-10-CM

## 2013-10-11 DIAGNOSIS — Z789 Other specified health status: Secondary | ICD-10-CM | POA: Diagnosis not present

## 2013-10-11 DIAGNOSIS — Z85820 Personal history of malignant melanoma of skin: Secondary | ICD-10-CM

## 2013-10-11 DIAGNOSIS — M545 Low back pain, unspecified: Secondary | ICD-10-CM

## 2013-10-11 DIAGNOSIS — I2510 Atherosclerotic heart disease of native coronary artery without angina pectoris: Secondary | ICD-10-CM | POA: Diagnosis not present

## 2013-10-11 DIAGNOSIS — I10 Essential (primary) hypertension: Secondary | ICD-10-CM

## 2013-10-11 DIAGNOSIS — Z87448 Personal history of other diseases of urinary system: Secondary | ICD-10-CM

## 2013-10-11 DIAGNOSIS — I480 Paroxysmal atrial fibrillation: Secondary | ICD-10-CM

## 2013-10-11 DIAGNOSIS — Z8739 Personal history of other diseases of the musculoskeletal system and connective tissue: Secondary | ICD-10-CM

## 2013-10-11 DIAGNOSIS — Z7409 Other reduced mobility: Secondary | ICD-10-CM

## 2013-10-11 NOTE — Patient Instructions (Signed)
Physical therapy prescription given for patient to arrange an appointment for impaired balance and gait strengthening. He will contact orthopedist in [place] regarding low back pain. Return in 6 months. Continue same medication. Continue diet exercise and weight loss efforts.

## 2013-10-11 NOTE — Progress Notes (Signed)
   Subjective:    Patient ID: EMAN MORIMOTO, male    DOB: 1934-11-29, 79 y.o.   MRN: [id number]  HPI  79 year old male in today for six-month recheck. In December he weighed 272 pounds and now weighs 280 pounds. BMI is 37. History of coronary artery disease, hypertension, hyperlipidemia, asthma, BPH, erectile dysfunction, low serum testosterone, adenomatous colon polyps, osteoarthritis status post multiple joint replacements of knees and hips, pituitary microadenoma, sleep apnea, spinal stenosis. History of PAF and has been on chronic anticoagulation therapy but after several falls that has been discontinued. He's had 2 falls in the past 6 months, one in January and one recently coming out of the gas station carrying an item in each hand. He ambulates with a cane most of the time. Someone travels with him most of the time. Says his back hurts daily. Says it's mostly bilateral back pain and it bothers him when these rising from a sitting to a standing position. He takes tramadol daily. He would like to be considered for epidural steroid injections. He has seen Dr. Glenna Fellows in the past when he was practicing in [place]. Patient and wife think that they would like to seek consultation in [place] where pt. had all his joint replacements done. Continues to be followed by Dr. Jeffie Pollock for hematuria and dysuria. Appointment to see Cardiologist later this summer. Wife has noticed that he is a bit more forgetful but doesn't think  It is to the point that he needs to have neuropsychological testing for dementia.    Review of Systems     Objective:   Physical Exam Skin warm and dry. Nodes none. Neck is supple without JVD thyromegaly or carotid bruits. Chest clear to auscultation. Cardiac exam regular rate and rhythm. Extremities without pitting edema. He is alert and oriented and carries on an appropriate conversation.       Assessment & Plan:  Morbid obesity  Impaired balance-likely due to  osteoarthritis, spinal stenosis and age. Prescription given for physical therapy. Needs to ambulate with cane at all times. Has had 2 falls in the past 6 months.  Low back pain-history spinal stenosis. Patient will seek consultation in [place] he would like to have epidural steroid injection. No recent MRI of LS-spine.  Hypertension-stable  Hyperlipidemia-stable  History of paroxysmal atrial fibrillation-now in sinus rhythm and off anticoagulation therapy  Sleep apnea-does not tolerate CPAP  Pituitary microadenoma followed by Dr. Chalmers Cater  History of hematuria and dysuria followed by urologist  Plan: Continue same medications, seek  consultation in [place] for back pain, physical therapy for impaired balance. Return in 6 months for physical examination. Continue diet and weight loss efforts.

## 2013-11-01 ENCOUNTER — Ambulatory Visit: Payer: Medicare Other | Admitting: Cardiology

## 2013-11-07 DIAGNOSIS — H524 Presbyopia: Secondary | ICD-10-CM | POA: Diagnosis not present

## 2013-11-07 DIAGNOSIS — H35039 Hypertensive retinopathy, unspecified eye: Secondary | ICD-10-CM | POA: Diagnosis not present

## 2013-11-07 DIAGNOSIS — H43819 Vitreous degeneration, unspecified eye: Secondary | ICD-10-CM | POA: Diagnosis not present

## 2013-11-07 DIAGNOSIS — H40019 Open angle with borderline findings, low risk, unspecified eye: Secondary | ICD-10-CM | POA: Diagnosis not present

## 2013-11-07 DIAGNOSIS — H2510 Age-related nuclear cataract, unspecified eye: Secondary | ICD-10-CM | POA: Diagnosis not present

## 2013-11-08 DIAGNOSIS — R269 Unspecified abnormalities of gait and mobility: Secondary | ICD-10-CM | POA: Diagnosis not present

## 2013-11-13 ENCOUNTER — Ambulatory Visit (INDEPENDENT_AMBULATORY_CARE_PROVIDER_SITE_OTHER): Payer: Medicare Other | Admitting: Cardiology

## 2013-11-13 ENCOUNTER — Encounter: Payer: Self-pay | Admitting: Cardiology

## 2013-11-13 VITALS — BP 121/77 | HR 58 | Ht 74.0 in | Wt 298.4 lb

## 2013-11-13 DIAGNOSIS — I4892 Unspecified atrial flutter: Secondary | ICD-10-CM | POA: Diagnosis not present

## 2013-11-13 DIAGNOSIS — I2510 Atherosclerotic heart disease of native coronary artery without angina pectoris: Secondary | ICD-10-CM

## 2013-11-13 DIAGNOSIS — G4730 Sleep apnea, unspecified: Secondary | ICD-10-CM | POA: Diagnosis not present

## 2013-11-13 DIAGNOSIS — I10 Essential (primary) hypertension: Secondary | ICD-10-CM | POA: Diagnosis not present

## 2013-11-13 NOTE — Assessment & Plan Note (Signed)
C-pap intol 

## 2013-11-13 NOTE — Assessment & Plan Note (Signed)
BMI 38 

## 2013-11-13 NOTE — Assessment & Plan Note (Signed)
Controlled.  

## 2013-11-13 NOTE — Assessment & Plan Note (Signed)
He has not had recurrent A flutter- he was taken of Xarelto because of falls, and the fact that he was holding NSR.

## 2013-11-13 NOTE — Patient Instructions (Signed)
Your physician recommends that you schedule a follow-up appointment in: 6 Months Dr Gwenlyn Found

## 2013-11-13 NOTE — Assessment & Plan Note (Signed)
He is working with physical therapy

## 2013-11-13 NOTE — Progress Notes (Signed)
11/13/2013 Mitchell [name]   [birth year]  [id number]  Primary Physicia Elby Showers, MD Primary Cardiologist: Dr Gwenlyn Found  HPI:  The patient is a very pleasant 79 year old obese male, father of 2, grandfather to 4 grandchildren, who is accompanied by his wife today. He is follwed by Dr Gwenlyn Found and Dr Renold Genta. He is active in the cattle business. He has a history of CAD status post remote PTI and stenting of his posterolateral branch March 18, 2000, with residual mild to moderate LAD and circumflex disease, and normal EF. He underwent TEE-guided DC cardioversion by Dr. Sanda Klein, May 26, 2011, after being admitted with mild congestive heart failure secondary to atrial flutter with 2:1 block. He had LVD related to his atrial flutter, his EF went from 35-55%.             He has been doing well since that time. He has obstructive sleep apnea, intolerant to CPAP. He had been on Xarelto for paroxysmal atrial flutter. This was discontinued after a negative event monitor. He had also been having problems with is balance and had had a couple of falls. He apparently has some spinal stenosis. He is now getting physical therapy. His wife says he is doing "much better" off Xarelto.  He denies chest pain or shortness of breath.     Current Outpatient Prescriptions  Medication Sig Dispense Refill  . Acetaminophen (TYLENOL EXTRA STRENGTH PO) Take 1,000 mg by mouth every 6 (six) hours as needed (pain).       [name] amLODipine (NORVASC) 5 MG tablet Take 5 mg by mouth daily.      [name] aspirin EC 325 MG tablet Take 1 tablet (325 mg total) by mouth daily.  30 tablet  0  . atenolol (TENORMIN) 25 MG tablet TAKE 1 TABLET BY MOUTH DAILY.  30 tablet  11  . calcium carbonate (OS-CAL) 600 MG TABS Take 600 mg by mouth 2 (two) times daily with a meal.        . carboxymethylcellulose (REFRESH PLUS) 0.5 % SOLN Place 1 drop into both eyes 3 (three) times daily as needed (dry eyes).      . ezetimibe (ZETIA) 10 MG tablet Take 10  mg by mouth daily.      . fish oil-omega-3 fatty acids 1000 MG capsule Take 1 g by mouth 2 (two) times daily.       . furosemide (LASIX) 40 MG tablet Take 0.5 tablets (20 mg total) by mouth every other day.  30 tablet  5  . halobetasol (ULTRAVATE) 0.05 % cream Apply 1 application topically as needed (rash).       [name] levalbuterol (XOPENEX HFA) 45 MCG/ACT inhaler Inhale 2 puffs into the lungs every 4 (four) hours as needed for wheezing.      . meloxicam (MOBIC) 15 MG tablet Take 15 mg by mouth daily.      [name] menthol-cetylpyridinium (CEPACOL) 3 MG lozenge Take 1 lozenge by mouth as needed for sore throat (cough).      . pantoprazole (PROTONIX) 40 MG tablet Take 1 tablet (40 mg total) by mouth daily.  30 tablet  11  . polyethylene glycol (MIRALAX / GLYCOLAX) packet Take 17 g by mouth daily.      . saw palmetto 160 MG capsule Take 160 mg by mouth 2 (two) times daily.        . traMADol (ULTRAM) 50 MG tablet Take 50 mg by mouth 2 (two) times daily.  No current facility-administered medications for this visit.    Allergies  Allergen Reactions  . Statins     History   Social History  . Marital Status: Single    Spouse Name: N/A    Number of Children: N/A  . Years of Education: N/A   Occupational History  . Not on file.   Social History Main Topics  . Smoking status: Never Smoker   . Smokeless tobacco: Former Systems developer    Types: [place] date: 09/16/1972  . Alcohol Use: No  . Drug Use: No  . Sexual Activity: Not Currently   Other Topics Concern  . Not on file   Social History Narrative  . No narrative on file     Review of Systems: General: negative for chills, fever, night sweats or weight changes.  Cardiovascular: negative for chest pain, dyspnea on exertion, edema, orthopnea, palpitations, paroxysmal nocturnal dyspnea or shortness of breath Dermatological: negative for rash Respiratory: negative for cough or wheezing Urologic: negative for hematuria Abdominal: negative  for nausea, vomiting, diarrhea, bright red blood per rectum, melena, or hematemesis Neurologic: negative for visual changes, syncope, or dizziness All other systems reviewed and are otherwise negative except as noted above.    Blood pressure 121/77, pulse 58, height 6\' 2"  (1.88 m), weight 298 lb 6.4 oz (135.353 kg).  General appearance: alert, cooperative, no distress and morbidly obese Neck: no carotid bruit and no JVD Lungs: clear to auscultation bilaterally Heart: regular rate and rhythm  EKG NSR, SB  ASSESSMENT AND PLAN:   CAD- BMS to PLA 2001, 70% mid LAD, 50-60% prox. LCX, 60% PDA- Low risk Myoview Jan 2012 Last nuc. Study 04/22/11 low risk for suble inferior ischemia. No angina  Atrial flutter with RVR- s/p TEE CV Feb 2013 He has not had recurrent A flutter- he was taken of Xarelto because of falls, and the fact that he was holding NSR.  HTN (hypertension), Controlled  Morbid obesity BMI 38  Sleep apnea, intolerant to cpap C-pap intol  Spinal stenosis He is working with physical therapy   PLAN  Same cardiac Rx. He will see Dr Gwenlyn Found in 6 months.  Eleen Litz KPA-C 11/13/2013 11:21 AM

## 2013-11-13 NOTE — Assessment & Plan Note (Signed)
Last nuc. Study 04/22/11 low risk for suble inferior ischemia. No angina

## 2013-11-20 ENCOUNTER — Other Ambulatory Visit: Payer: Self-pay

## 2013-11-20 DIAGNOSIS — R269 Unspecified abnormalities of gait and mobility: Secondary | ICD-10-CM | POA: Diagnosis not present

## 2013-11-20 MED ORDER — TRAMADOL HCL 50 MG PO TABS: 50.0000 mg | ORAL_TABLET | Freq: Two times a day (BID) | ORAL | Status: DC

## 2013-11-23 DIAGNOSIS — R269 Unspecified abnormalities of gait and mobility: Secondary | ICD-10-CM | POA: Diagnosis not present

## 2013-11-27 DIAGNOSIS — R269 Unspecified abnormalities of gait and mobility: Secondary | ICD-10-CM | POA: Diagnosis not present

## 2013-11-29 DIAGNOSIS — R269 Unspecified abnormalities of gait and mobility: Secondary | ICD-10-CM | POA: Diagnosis not present

## 2013-12-04 DIAGNOSIS — R269 Unspecified abnormalities of gait and mobility: Secondary | ICD-10-CM | POA: Diagnosis not present

## 2013-12-06 DIAGNOSIS — R269 Unspecified abnormalities of gait and mobility: Secondary | ICD-10-CM | POA: Diagnosis not present

## 2013-12-10 DIAGNOSIS — R269 Unspecified abnormalities of gait and mobility: Secondary | ICD-10-CM | POA: Diagnosis not present

## 2013-12-11 DIAGNOSIS — Z96659 Presence of unspecified artificial knee joint: Secondary | ICD-10-CM | POA: Diagnosis not present

## 2013-12-11 DIAGNOSIS — Z471 Aftercare following joint replacement surgery: Secondary | ICD-10-CM | POA: Diagnosis not present

## 2013-12-11 DIAGNOSIS — Z96649 Presence of unspecified artificial hip joint: Secondary | ICD-10-CM | POA: Diagnosis not present

## 2013-12-12 DIAGNOSIS — N4340 Spermatocele of epididymis, unspecified: Secondary | ICD-10-CM | POA: Diagnosis not present

## 2013-12-12 DIAGNOSIS — N37 Urethral disorders in diseases classified elsewhere: Secondary | ICD-10-CM | POA: Diagnosis not present

## 2013-12-12 DIAGNOSIS — N509 Disorder of male genital organs, unspecified: Secondary | ICD-10-CM | POA: Diagnosis not present

## 2013-12-12 DIAGNOSIS — R300 Dysuria: Secondary | ICD-10-CM | POA: Diagnosis not present

## 2013-12-13 DIAGNOSIS — R269 Unspecified abnormalities of gait and mobility: Secondary | ICD-10-CM | POA: Diagnosis not present

## 2013-12-18 ENCOUNTER — Other Ambulatory Visit: Payer: Self-pay | Admitting: Internal Medicine

## 2013-12-18 DIAGNOSIS — R269 Unspecified abnormalities of gait and mobility: Secondary | ICD-10-CM | POA: Diagnosis not present

## 2013-12-20 DIAGNOSIS — R269 Unspecified abnormalities of gait and mobility: Secondary | ICD-10-CM | POA: Diagnosis not present

## 2013-12-25 DIAGNOSIS — R269 Unspecified abnormalities of gait and mobility: Secondary | ICD-10-CM | POA: Diagnosis not present

## 2013-12-27 DIAGNOSIS — R269 Unspecified abnormalities of gait and mobility: Secondary | ICD-10-CM | POA: Diagnosis not present

## 2014-01-01 DIAGNOSIS — R269 Unspecified abnormalities of gait and mobility: Secondary | ICD-10-CM | POA: Diagnosis not present

## 2014-01-03 DIAGNOSIS — R269 Unspecified abnormalities of gait and mobility: Secondary | ICD-10-CM | POA: Diagnosis not present

## 2014-01-08 DIAGNOSIS — R269 Unspecified abnormalities of gait and mobility: Secondary | ICD-10-CM | POA: Diagnosis not present

## 2014-01-10 DIAGNOSIS — R269 Unspecified abnormalities of gait and mobility: Secondary | ICD-10-CM | POA: Diagnosis not present

## 2014-01-15 DIAGNOSIS — R269 Unspecified abnormalities of gait and mobility: Secondary | ICD-10-CM | POA: Diagnosis not present

## 2014-01-17 DIAGNOSIS — M6281 Muscle weakness (generalized): Secondary | ICD-10-CM | POA: Diagnosis not present

## 2014-01-17 DIAGNOSIS — R269 Unspecified abnormalities of gait and mobility: Secondary | ICD-10-CM | POA: Diagnosis not present

## 2014-01-22 ENCOUNTER — Other Ambulatory Visit: Payer: Self-pay | Admitting: Internal Medicine

## 2014-01-23 ENCOUNTER — Ambulatory Visit (INDEPENDENT_AMBULATORY_CARE_PROVIDER_SITE_OTHER): Payer: Medicare Other | Admitting: Internal Medicine

## 2014-01-23 DIAGNOSIS — Z23 Encounter for immunization: Secondary | ICD-10-CM | POA: Diagnosis not present

## 2014-02-20 DIAGNOSIS — H40013 Open angle with borderline findings, low risk, bilateral: Secondary | ICD-10-CM | POA: Diagnosis not present

## 2014-03-05 DIAGNOSIS — L570 Actinic keratosis: Secondary | ICD-10-CM | POA: Diagnosis not present

## 2014-03-05 DIAGNOSIS — Z08 Encounter for follow-up examination after completed treatment for malignant neoplasm: Secondary | ICD-10-CM | POA: Diagnosis not present

## 2014-03-05 DIAGNOSIS — X32XXXD Exposure to sunlight, subsequent encounter: Secondary | ICD-10-CM | POA: Diagnosis not present

## 2014-03-05 DIAGNOSIS — Z1283 Encounter for screening for malignant neoplasm of skin: Secondary | ICD-10-CM | POA: Diagnosis not present

## 2014-03-05 DIAGNOSIS — Z85820 Personal history of malignant melanoma of skin: Secondary | ICD-10-CM | POA: Diagnosis not present

## 2014-03-11 DIAGNOSIS — E236 Other disorders of pituitary gland: Secondary | ICD-10-CM | POA: Diagnosis not present

## 2014-03-11 DIAGNOSIS — D443 Neoplasm of uncertain behavior of pituitary gland: Secondary | ICD-10-CM | POA: Diagnosis not present

## 2014-03-13 DIAGNOSIS — E221 Hyperprolactinemia: Secondary | ICD-10-CM | POA: Diagnosis not present

## 2014-03-13 DIAGNOSIS — M899 Disorder of bone, unspecified: Secondary | ICD-10-CM | POA: Diagnosis not present

## 2014-03-13 DIAGNOSIS — D443 Neoplasm of uncertain behavior of pituitary gland: Secondary | ICD-10-CM | POA: Diagnosis not present

## 2014-03-13 DIAGNOSIS — E291 Testicular hypofunction: Secondary | ICD-10-CM | POA: Diagnosis not present

## 2014-03-20 ENCOUNTER — Other Ambulatory Visit: Payer: Self-pay

## 2014-03-20 MED ORDER — TRAMADOL HCL 50 MG PO TABS
50.0000 mg | ORAL_TABLET | Freq: Two times a day (BID) | ORAL | Status: DC
Start: 2014-03-20 — End: 2014-09-17

## 2014-03-20 NOTE — Telephone Encounter (Signed)
Tramadol called into [facility].

## 2014-05-20 ENCOUNTER — Other Ambulatory Visit: Payer: Medicare Other | Admitting: Internal Medicine

## 2014-05-20 DIAGNOSIS — Z125 Encounter for screening for malignant neoplasm of prostate: Secondary | ICD-10-CM

## 2014-05-20 DIAGNOSIS — Z0000 Encounter for general adult medical examination without abnormal findings: Secondary | ICD-10-CM

## 2014-05-20 DIAGNOSIS — I10 Essential (primary) hypertension: Secondary | ICD-10-CM

## 2014-05-20 DIAGNOSIS — N400 Enlarged prostate without lower urinary tract symptoms: Secondary | ICD-10-CM | POA: Diagnosis not present

## 2014-05-20 DIAGNOSIS — E039 Hypothyroidism, unspecified: Secondary | ICD-10-CM

## 2014-05-20 DIAGNOSIS — E785 Hyperlipidemia, unspecified: Secondary | ICD-10-CM

## 2014-05-20 LAB — CBC WITH DIFFERENTIAL/PLATELET
Basophils Absolute: 0 10*3/uL (ref 0.0–0.1)
Basophils Relative: 0 % (ref 0–1)
Eosinophils Absolute: 0.3 10*3/uL (ref 0.0–0.7)
Eosinophils Relative: 3 % (ref 0–5)
HCT: 45.4 % (ref 39.0–52.0)
Hemoglobin: 14.9 g/dL (ref 13.0–17.0)
Lymphocytes Relative: 19 % (ref 12–46)
Lymphs Abs: 1.9 10*3/uL (ref 0.7–4.0)
MCH: 31.4 pg (ref 26.0–34.0)
MCHC: 32.8 g/dL (ref 30.0–36.0)
MCV: 95.6 fL (ref 78.0–100.0)
MPV: 9.3 fL (ref 8.6–12.4)
Monocytes Absolute: 0.5 10*3/uL (ref 0.1–1.0)
Monocytes Relative: 5 % (ref 3–12)
Neutro Abs: 7.2 10*3/uL (ref 1.7–7.7)
Neutrophils Relative %: 73 % (ref 43–77)
Platelets: 265 10*3/uL (ref 150–400)
RBC: 4.75 MIL/uL (ref 4.22–5.81)
RDW: 13.4 % (ref 11.5–15.5)
WBC: 9.9 10*3/uL (ref 4.0–10.5)

## 2014-05-20 LAB — LIPID PANEL
Cholesterol: 172 mg/dL (ref 0–200)
HDL: 41 mg/dL (ref >39)
LDL Cholesterol: 110 mg/dL — ABNORMAL HIGH (ref 0–99)
Total CHOL/HDL Ratio: 4.2 Ratio
Triglycerides: 103 mg/dL (ref <150)
VLDL: 21 mg/dL (ref 0–40)

## 2014-05-20 LAB — COMPREHENSIVE METABOLIC PANEL
ALT: 14 U/L (ref 0–53)
AST: 16 U/L (ref 0–37)
Albumin: 3.9 g/dL (ref 3.5–5.2)
Alkaline Phosphatase: 101 U/L (ref 39–117)
BUN: 19 mg/dL (ref 6–23)
CO2: 29 mEq/L (ref 19–32)
Calcium: 9 mg/dL (ref 8.4–10.5)
Chloride: 103 mEq/L (ref 96–112)
Creat: 0.76 mg/dL (ref 0.50–1.35)
Glucose, Bld: 101 mg/dL — ABNORMAL HIGH (ref 70–99)
Potassium: 4.8 mEq/L (ref 3.5–5.3)
Sodium: 140 mEq/L (ref 135–145)
Total Bilirubin: 0.5 mg/dL (ref 0.2–1.2)
Total Protein: 6.2 g/dL (ref 6.0–8.3)

## 2014-05-21 LAB — PSA: PSA: 0.72 ng/mL (ref <=4.00)

## 2014-05-22 ENCOUNTER — Other Ambulatory Visit: Payer: Self-pay | Admitting: Cardiovascular Disease

## 2014-05-22 NOTE — Telephone Encounter (Signed)
Rx has been sent to the pharmacy electronically. ° °

## 2014-05-23 ENCOUNTER — Ambulatory Visit (INDEPENDENT_AMBULATORY_CARE_PROVIDER_SITE_OTHER): Payer: Medicare Other | Admitting: Internal Medicine

## 2014-05-23 ENCOUNTER — Encounter: Payer: Self-pay | Admitting: Internal Medicine

## 2014-05-23 VITALS — BP 120/74 | HR 62 | Temp 97.5°F | Wt 270.0 lb

## 2014-05-23 DIAGNOSIS — E291 Testicular hypofunction: Secondary | ICD-10-CM | POA: Diagnosis not present

## 2014-05-23 DIAGNOSIS — Z0000 Encounter for general adult medical examination without abnormal findings: Secondary | ICD-10-CM | POA: Diagnosis not present

## 2014-05-23 DIAGNOSIS — Z789 Other specified health status: Secondary | ICD-10-CM

## 2014-05-23 DIAGNOSIS — Z87898 Personal history of other specified conditions: Secondary | ICD-10-CM

## 2014-05-23 DIAGNOSIS — E785 Hyperlipidemia, unspecified: Secondary | ICD-10-CM

## 2014-05-23 DIAGNOSIS — Z85820 Personal history of malignant melanoma of skin: Secondary | ICD-10-CM | POA: Diagnosis not present

## 2014-05-23 DIAGNOSIS — Z8679 Personal history of other diseases of the circulatory system: Secondary | ICD-10-CM

## 2014-05-23 DIAGNOSIS — Z955 Presence of coronary angioplasty implant and graft: Secondary | ICD-10-CM | POA: Diagnosis not present

## 2014-05-23 DIAGNOSIS — Z7901 Long term (current) use of anticoagulants: Secondary | ICD-10-CM | POA: Diagnosis not present

## 2014-05-23 DIAGNOSIS — Z8669 Personal history of other diseases of the nervous system and sense organs: Secondary | ICD-10-CM

## 2014-05-23 DIAGNOSIS — M791 Myalgia: Secondary | ICD-10-CM | POA: Diagnosis not present

## 2014-05-23 DIAGNOSIS — Z889 Allergy status to unspecified drugs, medicaments and biological substances status: Secondary | ICD-10-CM

## 2014-05-23 DIAGNOSIS — Z87438 Personal history of other diseases of male genital organs: Secondary | ICD-10-CM

## 2014-05-23 DIAGNOSIS — M7918 Myalgia, other site: Secondary | ICD-10-CM

## 2014-05-23 DIAGNOSIS — I2510 Atherosclerotic heart disease of native coronary artery without angina pectoris: Secondary | ICD-10-CM | POA: Diagnosis not present

## 2014-05-23 DIAGNOSIS — Z8709 Personal history of other diseases of the respiratory system: Secondary | ICD-10-CM

## 2014-05-23 DIAGNOSIS — E229 Hyperfunction of pituitary gland, unspecified: Secondary | ICD-10-CM

## 2014-05-23 DIAGNOSIS — D352 Benign neoplasm of pituitary gland: Secondary | ICD-10-CM | POA: Diagnosis not present

## 2014-05-23 DIAGNOSIS — I519 Heart disease, unspecified: Secondary | ICD-10-CM

## 2014-05-23 DIAGNOSIS — Z23 Encounter for immunization: Secondary | ICD-10-CM | POA: Diagnosis not present

## 2014-05-23 DIAGNOSIS — G8929 Other chronic pain: Secondary | ICD-10-CM

## 2014-05-23 LAB — POCT URINALYSIS DIPSTICK
Bilirubin, UA: NEGATIVE
Blood, UA: NEGATIVE
Glucose, UA: NEGATIVE
Ketones, UA: NEGATIVE
Leukocytes, UA: NEGATIVE
Nitrite, UA: NEGATIVE
Protein, UA: NEGATIVE
Spec Grav, UA: 1.015
Urobilinogen, UA: NEGATIVE
pH, UA: 6

## 2014-05-23 NOTE — Patient Instructions (Addendum)
Continue same meds and continue diet exercise and weight loss efforts. Return in 6 months

## 2014-06-19 ENCOUNTER — Ambulatory Visit (INDEPENDENT_AMBULATORY_CARE_PROVIDER_SITE_OTHER): Payer: Medicare Other | Admitting: Cardiovascular Disease

## 2014-06-19 ENCOUNTER — Other Ambulatory Visit: Payer: Self-pay | Admitting: Internal Medicine

## 2014-06-19 ENCOUNTER — Encounter: Payer: Self-pay | Admitting: Cardiovascular Disease

## 2014-06-19 VITALS — BP 146/72 | HR 61 | Ht 73.0 in | Wt 298.7 lb

## 2014-06-19 DIAGNOSIS — I10 Essential (primary) hypertension: Secondary | ICD-10-CM

## 2014-06-19 DIAGNOSIS — N390 Urinary tract infection, site not specified: Secondary | ICD-10-CM | POA: Diagnosis not present

## 2014-06-19 DIAGNOSIS — I2510 Atherosclerotic heart disease of native coronary artery without angina pectoris: Secondary | ICD-10-CM

## 2014-06-19 DIAGNOSIS — R3912 Poor urinary stream: Secondary | ICD-10-CM | POA: Diagnosis not present

## 2014-06-19 DIAGNOSIS — E785 Hyperlipidemia, unspecified: Secondary | ICD-10-CM | POA: Diagnosis not present

## 2014-06-19 DIAGNOSIS — I4892 Unspecified atrial flutter: Secondary | ICD-10-CM | POA: Diagnosis not present

## 2014-06-19 DIAGNOSIS — I5041 Acute combined systolic (congestive) and diastolic (congestive) heart failure: Secondary | ICD-10-CM

## 2014-06-19 DIAGNOSIS — N37 Urethral disorders in diseases classified elsewhere: Secondary | ICD-10-CM | POA: Diagnosis not present

## 2014-06-19 DIAGNOSIS — N4340 Spermatocele of epididymis, unspecified: Secondary | ICD-10-CM | POA: Diagnosis not present

## 2014-06-19 DIAGNOSIS — R300 Dysuria: Secondary | ICD-10-CM | POA: Diagnosis not present

## 2014-06-19 NOTE — Assessment & Plan Note (Signed)
History of hyperlipidemia intolerant to statin drugs. He is on Zetia. His most recent lipid profile performed 05/20/14 revealed a total cholesterol 172, LDL 110 and HDL of 41

## 2014-06-19 NOTE — Assessment & Plan Note (Signed)
History of hypertension with blood pressure measured at 146/72. He is on amlodipine, and atenolol. Continue current meds at current dosing

## 2014-06-19 NOTE — Patient Instructions (Signed)
We request that you follow-up in: 6 months with Lurena Joiner, PA-C and in 12 months with Dr Gwenlyn Found.  You will receive a reminder letter in the mail two months in advance. If you don't receive a letter, please call our office to schedule the follow-up appointment.

## 2014-06-19 NOTE — Progress Notes (Signed)
06/19/2014 Mitchell [name]   Jul 29, 1934  [id number]  Primary Physician Elby Showers, MD Primary Cardiologist: Lorretta Harp MD Renae Gloss   HPI:  The patient is a very pleasant 80 year old moderately overweight married Caucasian male, father of 13, grandfather to 4 grandchildren, who is accompanied by his wife today. I last saw him 12 months ago. He is active in the cattle business. He has a history of CAD status post remote PTI and stenting of his posterolateral branch by myself, March 18, 2000, with residual mild to moderate LAD and circumflex disease, and normal EF. He underwent TEE-guided DC cardioversion by Dr. Sanda Klein, May 26, 2011, after being admitted with mild congestive heart failure secondary to atrial flutter with 2:1 block. He has been doing well since that time. He has obstructive sleep apnea, intolerant to CPAP. His last Myoview performed April 23, 2010, was nonischemic. An echocardiogram revealed an EF of 35% to 40% during his hospitalization with heart failure, and subsequently improved to greater than 55% by echocardiogram in May of this year. He is otherwise asymptomatic. He was on Xarelto for paroxysmal atrial flutter which was discontinued after a negative event monitor. Since I saw him year ago he's remained clinically stable. He did see Kerin Ransom in the office back in July. His most recent lipid profile performed 05/20/14 revealed a total cholesterol 172, LDL of 110 and HDL of 41. He is statin intolerant.  Current Outpatient Prescriptions  Medication Sig Dispense Refill  . Acetaminophen (TYLENOL EXTRA STRENGTH PO) Take 1,000 mg by mouth every 6 (six) hours as needed (pain).     [name] amLODipine (NORVASC) 5 MG tablet TAKE 1 TABLET BY MOUTH DAILY. 30 tablet 11  . aspirin EC 325 MG tablet Take 1 tablet (325 mg total) by mouth daily. 30 tablet 0  . atenolol (TENORMIN) 25 MG tablet TAKE 1 TABLET BY MOUTH DAILY. 30 tablet 11  . calcium carbonate  (OS-CAL) 600 MG TABS Take 600 mg by mouth 2 (two) times daily with a meal.      . carboxymethylcellulose (REFRESH PLUS) 0.5 % SOLN Place 1 drop into both eyes 3 (three) times daily as needed (dry eyes).    . ezetimibe (ZETIA) 10 MG tablet Take 10 mg by mouth daily.    . furosemide (LASIX) 40 MG tablet Take 0.5 tablets (20 mg total) by mouth every other day. 30 tablet 5  . halobetasol (ULTRAVATE) 0.05 % cream Apply 1 application topically as needed (rash).     [name] levalbuterol (XOPENEX HFA) 45 MCG/ACT inhaler Inhale 2 puffs into the lungs every 4 (four) hours as needed for wheezing.    . meloxicam (MOBIC) 15 MG tablet Take 15 mg by mouth daily.    . pantoprazole (PROTONIX) 40 MG tablet TAKE 1 TABLET BY MOUTH DAILY. 30 tablet 3  . polyethylene glycol (MIRALAX / GLYCOLAX) packet Take 17 g by mouth daily.    . saw palmetto 160 MG capsule Take 160 mg by mouth 2 (two) times daily.      . traMADol (ULTRAM) 50 MG tablet Take 1 tablet (50 mg total) by mouth 2 (two) times daily. 60 tablet 5   No current facility-administered medications for this visit.    Allergies  Allergen Reactions  . Statins     History   Social History  . Marital Status: Single    Spouse Name: N/A  . Number of Children: N/A  . Years of Education: N/A   Occupational History  .  Not on file.   Social History Main Topics  . Smoking status: Never Smoker   . Smokeless tobacco: Former Systems developer    Types: [place] date: 09/16/1972  . Alcohol Use: No  . Drug Use: No  . Sexual Activity: Not Currently   Other Topics Concern  . Not on file   Social History Narrative     Review of Systems: General: negative for chills, fever, night sweats or weight changes.  Cardiovascular: negative for chest pain, dyspnea on exertion, edema, orthopnea, palpitations, paroxysmal nocturnal dyspnea or shortness of breath Dermatological: negative for rash Respiratory: negative for cough or wheezing Urologic: negative for  hematuria Abdominal: negative for nausea, vomiting, diarrhea, bright red blood per rectum, melena, or hematemesis Neurologic: negative for visual changes, syncope, or dizziness All other systems reviewed and are otherwise negative except as noted above.    Blood pressure 146/72, pulse 61, height 6\' 1"  (1.854 m), weight 298 lb 11.2 oz (135.489 kg).  General appearance: alert and no distress Neck: no adenopathy, no carotid bruit, no JVD, supple, symmetrical, trachea midline and thyroid not enlarged, symmetric, no tenderness/mass/nodules Lungs: clear to auscultation bilaterally Heart: regular rate and rhythm, S1, S2 normal, no murmur, click, rub or gallop Extremities: extremities normal, atraumatic, no cyanosis or edema  EKG normal sinus rhythm at 61 without ST or T-wave changes. I personally reviewed this EKG  ASSESSMENT AND PLAN:   Hyperlipidemia History of hyperlipidemia intolerant to statin drugs. He is on Zetia. His most recent lipid profile performed 05/20/14 revealed a total cholesterol 172, LDL 110 and HDL of 41   HTN (hypertension), History of hypertension with blood pressure measured at 146/72. He is on amlodipine, and atenolol. Continue current meds at current dosing   CHF, acute, mild secondary to EF 40-45% an a. flutter with RVR History of atrial flutter with rapid ventricular response requiring DC cardioversion. His ejection fraction improved from the 35-40% range up to 55% by 2-D echo. He was put on oral hydration for a time but this has been discontinued because of side effects. He has maintained sinus rhythm.   CAD- BMS to PLA 2001, 70% mid LAD, 50-60% prox. LCX, 60% PDA- Low risk Myoview Jan 2012 History of CAD status post PCI and stenting of the posterolateral branch by myself 03/18/00 was with residual mild to moderate LAD and circumflex disease. He had a Myoview stress test performed 04/23/10 which was nonischemic. He denies chest pain or shortness of  breath.       Lorretta Harp MD FACP,FACC,FAHA, [facility] 06/19/2014 10:11 AM

## 2014-06-19 NOTE — Assessment & Plan Note (Signed)
History of CAD status post PCI and stenting of the posterolateral branch by myself 03/18/00 was with residual mild to moderate LAD and circumflex disease. He had a Myoview stress test performed 04/23/10 which was nonischemic. He denies chest pain or shortness of breath.

## 2014-06-19 NOTE — Assessment & Plan Note (Signed)
History of atrial flutter with rapid ventricular response requiring DC cardioversion. His ejection fraction improved from the 35-40% range up to 55% by 2-D echo. He was put on oral hydration for a time but this has been discontinued because of side effects. He has maintained sinus rhythm.

## 2014-07-08 ENCOUNTER — Other Ambulatory Visit: Payer: Self-pay | Admitting: Internal Medicine

## 2014-07-08 ENCOUNTER — Other Ambulatory Visit: Payer: Self-pay | Admitting: Cardiovascular Disease

## 2014-07-08 NOTE — Telephone Encounter (Signed)
Rx has been sent to the pharmacy electronically. ° °

## 2014-07-09 ENCOUNTER — Encounter: Payer: Self-pay | Admitting: Internal Medicine

## 2014-07-09 ENCOUNTER — Ambulatory Visit (INDEPENDENT_AMBULATORY_CARE_PROVIDER_SITE_OTHER): Payer: Medicare Other | Admitting: Internal Medicine

## 2014-07-09 VITALS — BP 140/72 | HR 62 | Temp 98.1°F

## 2014-07-09 DIAGNOSIS — I2510 Atherosclerotic heart disease of native coronary artery without angina pectoris: Secondary | ICD-10-CM

## 2014-07-09 DIAGNOSIS — J45901 Unspecified asthma with (acute) exacerbation: Secondary | ICD-10-CM | POA: Diagnosis not present

## 2014-07-09 MED ORDER — LEVOFLOXACIN 500 MG PO TABS: 500.0000 mg | ORAL_TABLET | Freq: Every day | ORAL | Status: DC

## 2014-07-09 MED ORDER — PREDNISONE 10 MG PO KIT: PACK | ORAL | Status: DC

## 2014-07-09 MED ORDER — HYDROCODONE-HOMATROPINE 5-1.5 MG/5ML PO SYRP: 5.0000 mL | ORAL_SOLUTION | Freq: Three times a day (TID) | ORAL | Status: DC | PRN

## 2014-07-09 NOTE — Patient Instructions (Signed)
Take prednisone as directed in tapering course. Use inhaler 4 times daily. Take cough syrup as needed for cough. Take Levaquin 500 milligrams daily for 10 days.

## 2014-07-09 NOTE — Progress Notes (Signed)
   Subjective:    Patient ID: Mitchell [name], male    DOB: 1935/02/27, 80 y.o.   MRN: [id number]  HPI Had onset of URI symptoms Saturday, March 19. Wife was sick first with similar respiratory symptoms. Has had cough and congestion. No fever or shaking chills. Has had some wheezing. His been using inhaler. Had a rough night last night. Has been taking guaifenesin over-the-counter.    Review of Systems     Objective:   Physical Exam  Slight audible wheezing with inspiration. TMs are clear. Pharynx is clear. Neck is supple without adenopathy. Chest clear without wheezing or rales      Assessment & Plan:  Asthmatic bronchitis  Plan: Sterapred DS 10 mg six-day Dosepak. Levaquin 500 milligrams daily for 10 days. Hycodan 1 teaspoon by mouth every 8 hours when necessary cough. Use inhaler 2 sprays 4 times daily as needed for wheezing.

## 2014-07-18 NOTE — Progress Notes (Signed)
Subjective:    Patient ID: Mitchell [name], male    DOB: 17-Nov-1934, 80 y.o.   MRN: [id number]  HPI  80 year old white male with history of morbid obesity, hypertension,  right coronary artery stent placement, sleep apnea, history of atrial flutter, history of melanoma, chronic musculoskeletal pain status post 2 hip and 2 knee replacements, adenomatous colon polyp, history of asthma, pituitary microadenoma with hyperprolactinemia, erectile dysfunction, BPH in today for health maintenance exam and evaluation of medical problems. Weight last year was 272 pounds. Weight today is 270 pounds. In December 2013 he weighed 285 pounds.  Takes tramadol for musculoskeletal pain.  Adenomatous colon polyp March 2011. Colonoscopy done by Dr. Earle Gell.  History of melanoma left neck September 20,000. History of herpes zoster left trunk March 2000. He sees Dr. Chalmers Cater for pituitary microadenoma with prolactinemia and hypogonadism. Dr. Beckey Rutter in [place] is his orthopedist. Dr. Dayton Martes is his dermatologist. Dr. Herbert Deaner is his ophthalmologist. Dr. Quay Burow is his cardiologist.  History of angioplasty September 2001 with bare mental stent being placed to the right coronary artery.  Appendectomy 1958, right carpal tunnel release 1991, decompressive laminectomy L3-L4 and L4-L5 for spinal stenosis September 1995. Umbilical hernia repair 4742. Left lower lobe pneumonia in 1996. Right DVT with ruptured Baker's cyst requiring hospitalization February 1993.  History of intermittent hematuria which occurred prior to being on anticoagulation therapy. History of recurrent urinary infections from time to time which occur fairly suddenly.  Social history: Married with 2 adult children. Does not smoke. Self-employed cattleman. Does not consume alcohol.  Family history: Father died at age 81 of an MI. Mother died at age 67 of old age. One sister in good health.  He has chosen stop chronic anticoagulation. He  is statin intolerant and is currently on Zetia  Review of Systems  HENT: Negative.   Respiratory:       History of asthma aggravated by acute respiratory infections  Endocrine:       Pituitary microadenoma with hyperprolactinemia causing hypogonadism  Genitourinary:       History of BPH and erectile dysfunction  Musculoskeletal: Positive for arthralgias.  Neurological: Negative.   Psychiatric/Behavioral: Negative.        Objective:   Physical Exam  Constitutional: He is oriented to person, place, and time. He appears well-developed and well-nourished. No distress.  HENT:  Head: Normocephalic.  Left Ear: External ear normal.  Mouth/Throat: Oropharynx is clear and moist. No oropharyngeal exudate.  Eyes: Conjunctivae are normal. Pupils are equal, round, and reactive to light. Right eye exhibits no discharge. Left eye exhibits no discharge. No scleral icterus.  Neck: Neck supple. No JVD present. No thyromegaly present.  Cardiovascular: Normal rate, regular rhythm and normal heart sounds.   No murmur heard. Pulmonary/Chest: Breath sounds normal. He has no wheezes. He has no rales.  Abdominal: Soft. Bowel sounds are normal. He exhibits no distension and no mass. There is no tenderness. There is no rebound and no guarding.  Genitourinary: Prostate normal.  Prostate enlarged without nodules  Musculoskeletal: He exhibits no edema.  Neurological: He is alert and oriented to person, place, and time. He has normal reflexes. No cranial nerve deficit. Coordination normal.  Skin: Skin is warm and dry. No rash noted. He is not diaphoretic.  Psychiatric: He has a normal mood and affect. His behavior is normal. Judgment and thought content normal.  Vitals reviewed.         Assessment & Plan:  Morbid obesity  Essential hypertension-stable  Hyperlipidemia-statin intolerant  History of coronary artery disease status post coronary artery stent placement to right coronary artery  History  of atrial flutter  History of melanoma  He has stopped chronic anticoagulation   Chronic musculoskeletal pain  Adenomatous colon polyp  History of asthma  Pituitary microadenoma with hyperprolactinemia  History of sleep apnea  Plan: Seems to be doing fairly well with multiple complicated medical problems. Return in 6 months. Keep cardiology follow-up. Continue follow-up with endocrinologist. Recommend diet exercise and weight loss.

## 2014-07-25 DIAGNOSIS — R3912 Poor urinary stream: Secondary | ICD-10-CM | POA: Diagnosis not present

## 2014-07-31 DIAGNOSIS — R3912 Poor urinary stream: Secondary | ICD-10-CM | POA: Diagnosis not present

## 2014-07-31 DIAGNOSIS — N37 Urethral disorders in diseases classified elsewhere: Secondary | ICD-10-CM | POA: Diagnosis not present

## 2014-08-26 ENCOUNTER — Other Ambulatory Visit: Payer: Self-pay | Admitting: Cardiovascular Disease

## 2014-08-27 DIAGNOSIS — H40013 Open angle with borderline findings, low risk, bilateral: Secondary | ICD-10-CM | POA: Diagnosis not present

## 2014-08-29 DIAGNOSIS — R312 Other microscopic hematuria: Secondary | ICD-10-CM | POA: Diagnosis not present

## 2014-08-29 DIAGNOSIS — R3912 Poor urinary stream: Secondary | ICD-10-CM | POA: Diagnosis not present

## 2014-09-17 ENCOUNTER — Other Ambulatory Visit: Payer: Self-pay | Admitting: *Deleted

## 2014-09-17 MED ORDER — TRAMADOL HCL 50 MG PO TABS
50.0000 mg | ORAL_TABLET | Freq: Two times a day (BID) | ORAL | Status: DC
Start: 2014-09-17 — End: 2015-03-17

## 2014-09-17 NOTE — Telephone Encounter (Signed)
Tramadol refilled.

## 2014-09-24 ENCOUNTER — Other Ambulatory Visit: Payer: Self-pay | Admitting: Cardiovascular Disease

## 2014-09-24 NOTE — Telephone Encounter (Signed)
Rx has been sent to the pharmacy electronically. ° °

## 2014-10-16 DIAGNOSIS — R339 Retention of urine, unspecified: Secondary | ICD-10-CM | POA: Diagnosis not present

## 2014-10-16 DIAGNOSIS — N37 Urethral disorders in diseases classified elsewhere: Secondary | ICD-10-CM | POA: Diagnosis not present

## 2014-10-16 DIAGNOSIS — R3912 Poor urinary stream: Secondary | ICD-10-CM | POA: Diagnosis not present

## 2014-10-22 DIAGNOSIS — N37 Urethral disorders in diseases classified elsewhere: Secondary | ICD-10-CM | POA: Diagnosis not present

## 2014-11-05 DIAGNOSIS — M79605 Pain in left leg: Secondary | ICD-10-CM | POA: Diagnosis not present

## 2014-11-05 DIAGNOSIS — Z471 Aftercare following joint replacement surgery: Secondary | ICD-10-CM | POA: Diagnosis not present

## 2014-11-05 DIAGNOSIS — Z96643 Presence of artificial hip joint, bilateral: Secondary | ICD-10-CM | POA: Diagnosis not present

## 2014-11-09 DIAGNOSIS — M4316 Spondylolisthesis, lumbar region: Secondary | ICD-10-CM | POA: Diagnosis not present

## 2014-11-09 DIAGNOSIS — M47816 Spondylosis without myelopathy or radiculopathy, lumbar region: Secondary | ICD-10-CM | POA: Diagnosis not present

## 2014-11-09 DIAGNOSIS — M5126 Other intervertebral disc displacement, lumbar region: Secondary | ICD-10-CM | POA: Diagnosis not present

## 2014-11-09 DIAGNOSIS — M79605 Pain in left leg: Secondary | ICD-10-CM | POA: Diagnosis not present

## 2014-11-09 DIAGNOSIS — N281 Cyst of kidney, acquired: Secondary | ICD-10-CM | POA: Diagnosis not present

## 2014-11-12 DIAGNOSIS — H2513 Age-related nuclear cataract, bilateral: Secondary | ICD-10-CM | POA: Diagnosis not present

## 2014-11-12 DIAGNOSIS — H40013 Open angle with borderline findings, low risk, bilateral: Secondary | ICD-10-CM | POA: Diagnosis not present

## 2014-11-12 DIAGNOSIS — H43813 Vitreous degeneration, bilateral: Secondary | ICD-10-CM | POA: Diagnosis not present

## 2014-11-12 DIAGNOSIS — H35033 Hypertensive retinopathy, bilateral: Secondary | ICD-10-CM | POA: Diagnosis not present

## 2014-11-26 ENCOUNTER — Other Ambulatory Visit: Payer: Medicare Other | Admitting: Internal Medicine

## 2014-11-26 DIAGNOSIS — R7302 Impaired glucose tolerance (oral): Secondary | ICD-10-CM | POA: Diagnosis not present

## 2014-11-26 DIAGNOSIS — Z79899 Other long term (current) drug therapy: Secondary | ICD-10-CM | POA: Diagnosis not present

## 2014-11-26 DIAGNOSIS — R7309 Other abnormal glucose: Secondary | ICD-10-CM

## 2014-11-26 DIAGNOSIS — E785 Hyperlipidemia, unspecified: Secondary | ICD-10-CM

## 2014-11-26 LAB — HEMOGLOBIN A1C
Hgb A1c MFr Bld: 5.7 % — ABNORMAL HIGH (ref <5.7)
Mean Plasma Glucose: 117 mg/dL — ABNORMAL HIGH (ref <117)

## 2014-11-27 DIAGNOSIS — N37 Urethral disorders in diseases classified elsewhere: Secondary | ICD-10-CM | POA: Diagnosis not present

## 2014-11-27 LAB — HEPATIC FUNCTION PANEL
ALT: 10 U/L (ref 9–46)
AST: 17 U/L (ref 10–35)
Albumin: 3.9 g/dL (ref 3.6–5.1)
Alkaline Phosphatase: 100 U/L (ref 40–115)
Bilirubin, Direct: 0.1 mg/dL (ref <=0.2)
Indirect Bilirubin: 0.5 mg/dL (ref 0.2–1.2)
Total Bilirubin: 0.6 mg/dL (ref 0.2–1.2)
Total Protein: 6.3 g/dL (ref 6.1–8.1)

## 2014-11-27 LAB — BASIC METABOLIC PANEL
BUN: 20 mg/dL (ref 7–25)
CO2: 29 mmol/L (ref 20–31)
Calcium: 9.1 mg/dL (ref 8.6–10.3)
Chloride: 102 mmol/L (ref 98–110)
Creat: 0.68 mg/dL — ABNORMAL LOW (ref 0.70–1.18)
Glucose, Bld: 105 mg/dL — ABNORMAL HIGH (ref 65–99)
Potassium: 4.7 mmol/L (ref 3.5–5.3)
Sodium: 143 mmol/L (ref 135–146)

## 2014-11-27 LAB — LIPID PANEL
Cholesterol: 167 mg/dL (ref 125–200)
HDL: 38 mg/dL — ABNORMAL LOW (ref >=40)
LDL Cholesterol: 106 mg/dL (ref <130)
Total CHOL/HDL Ratio: 4.4 Ratio (ref <=5.0)
Triglycerides: 116 mg/dL (ref <150)
VLDL: 23 mg/dL (ref <30)

## 2014-12-03 ENCOUNTER — Ambulatory Visit (INDEPENDENT_AMBULATORY_CARE_PROVIDER_SITE_OTHER): Payer: Medicare Other | Admitting: Internal Medicine

## 2014-12-03 ENCOUNTER — Encounter: Payer: Self-pay | Admitting: Internal Medicine

## 2014-12-03 VITALS — BP 132/84 | HR 64 | Temp 97.9°F | Wt 282.0 lb

## 2014-12-03 DIAGNOSIS — T304 Corrosion of unspecified body region, unspecified degree: Secondary | ICD-10-CM | POA: Diagnosis not present

## 2014-12-03 DIAGNOSIS — E785 Hyperlipidemia, unspecified: Secondary | ICD-10-CM

## 2014-12-03 DIAGNOSIS — N35919 Unspecified urethral stricture, male, unspecified site: Secondary | ICD-10-CM

## 2014-12-03 DIAGNOSIS — L259 Unspecified contact dermatitis, unspecified cause: Secondary | ICD-10-CM | POA: Diagnosis not present

## 2014-12-03 DIAGNOSIS — M150 Primary generalized (osteo)arthritis: Secondary | ICD-10-CM | POA: Diagnosis not present

## 2014-12-03 DIAGNOSIS — I2510 Atherosclerotic heart disease of native coronary artery without angina pectoris: Secondary | ICD-10-CM | POA: Diagnosis not present

## 2014-12-03 DIAGNOSIS — N359 Urethral stricture, unspecified: Secondary | ICD-10-CM | POA: Diagnosis not present

## 2014-12-03 DIAGNOSIS — M159 Polyosteoarthritis, unspecified: Secondary | ICD-10-CM

## 2014-12-03 DIAGNOSIS — I519 Heart disease, unspecified: Secondary | ICD-10-CM | POA: Diagnosis not present

## 2014-12-03 DIAGNOSIS — M8949 Other hypertrophic osteoarthropathy, multiple sites: Secondary | ICD-10-CM

## 2014-12-03 MED ORDER — SILVER SULFADIAZINE 1 % EX CREA: 1.0000 "application " | TOPICAL_CREAM | Freq: Two times a day (BID) | CUTANEOUS | Status: DC

## 2014-12-03 NOTE — Progress Notes (Signed)
   Subjective:    Patient ID: Mitchell [name], male    DOB: [birth year], 80 y.o.   MRN: [id number]  HPI  80 year old White male in today for six-month recheck. He is also followed by Dr. Gwenlyn Found in Cardiology. He went to a pedicurist Friday, August 12 and some topical preparation was placed on his feet resulting in contact dermatitis/burn. He actually has a nickel-sized area on his left great toe where the skin is denuded. He has other lesions on his other toes dorsal aspects but none as severe as this one.  Lipid panel, liver functions are within normal limits. He remains on statin therapy. History of coronary artery disease. Hemoglobin A1c 5.7%. Basic metabolic panel is within normal limits.  History of urinary stricture recently dilated by Dr. Jeffie Pollock Review of Systems see above. Continues to have issues with ambulation due to morbid obesity and osteoarthritis     Objective:   Physical Exam  Neck: Neck supple. No JVD present.  Cardiovascular: Normal rate, regular rhythm and normal heart sounds.   No murmur heard. Pulmonary/Chest: Effort normal and breath sounds normal. He has no wheezes. He has no rales.  Lymphadenopathy:    He has no cervical adenopathy.  Neurological: He is alert. He has normal reflexes.  Skin:  Nickel sized denuded area left great toe dorsal aspect with erythematous excoriations other  toes dorsal aspects both feet  Psychiatric: He has a normal mood and affect. His behavior is normal. Judgment and thought content normal.  Vitals reviewed.         Assessment & Plan:  Hyperlipidemia  Left great toe injury  Contact dermatitis toes bilaterally  Coronary disease  Hyperlipidemia  Essential hypertension  Morbid obesity  Osteoarthritis   Plan: Continue current medications and return in 6 months. His toe was wrapped today after applying a light coating of Silvadene cream. Prescription given for same. They are to bandage twice daily until healed. His tetanus  immunization is up-to-date.

## 2014-12-03 NOTE — Patient Instructions (Signed)
Continue same medications and return in 6 months. Apply silver doing cream to great toe twice daily until healed.

## 2014-12-05 ENCOUNTER — Encounter: Payer: Self-pay | Admitting: Internal Medicine

## 2014-12-05 ENCOUNTER — Ambulatory Visit (INDEPENDENT_AMBULATORY_CARE_PROVIDER_SITE_OTHER): Payer: Medicare Other | Admitting: Internal Medicine

## 2014-12-05 VITALS — BP 132/74 | HR 59 | Temp 97.9°F | Wt 282.0 lb

## 2014-12-05 DIAGNOSIS — L089 Local infection of the skin and subcutaneous tissue, unspecified: Secondary | ICD-10-CM

## 2014-12-05 DIAGNOSIS — I2510 Atherosclerotic heart disease of native coronary artery without angina pectoris: Secondary | ICD-10-CM

## 2014-12-05 DIAGNOSIS — T304 Corrosion of unspecified body region, unspecified degree: Secondary | ICD-10-CM | POA: Diagnosis not present

## 2014-12-05 MED ORDER — LEVOFLOXACIN 500 MG PO TABS: 500.0000 mg | ORAL_TABLET | Freq: Every day | ORAL | Status: DC

## 2014-12-05 MED ORDER — MUPIROCIN 2 % EX OINT: 1.0000 "application " | TOPICAL_OINTMENT | Freq: Two times a day (BID) | CUTANEOUS | Status: DC

## 2014-12-05 NOTE — Progress Notes (Signed)
   Subjective:    Patient ID: Mitchell [name], male    DOB: [birth year], 80 y.o.   MRN: [id number]  HPI   It was here on August 16 for six-month recheck and suffered an apparent chemical type burn on left great toe after having a pedicure. On August 16 it was dressed lightly with Silvadene cream and a light gauze dressing. It has turned a bit red and wife was concerned about possible infection. It is slightly warm to touch.    Review of Systems     Objective:   Physical Exam   persistent superficial chemical burn dorsal aspect left great toe with surrounding erythema. Slight increased warmth. No drainage.      Assessment & Plan:   Possible secondary infection from chemical burn left great toe   Plan: Levaquin 500 milligrams daily for 10 days. Continue Silvadene cream. Call if symptoms worsen.

## 2014-12-12 DIAGNOSIS — M5416 Radiculopathy, lumbar region: Secondary | ICD-10-CM | POA: Diagnosis not present

## 2014-12-12 DIAGNOSIS — M4806 Spinal stenosis, lumbar region: Secondary | ICD-10-CM | POA: Diagnosis not present

## 2014-12-12 DIAGNOSIS — M9983 Other biomechanical lesions of lumbar region: Secondary | ICD-10-CM | POA: Diagnosis not present

## 2014-12-12 DIAGNOSIS — M545 Low back pain: Secondary | ICD-10-CM | POA: Diagnosis not present

## 2014-12-13 ENCOUNTER — Encounter: Payer: Self-pay | Admitting: Cardiology

## 2014-12-13 ENCOUNTER — Ambulatory Visit (INDEPENDENT_AMBULATORY_CARE_PROVIDER_SITE_OTHER): Payer: Medicare Other | Admitting: Cardiology

## 2014-12-13 VITALS — BP 130/70 | HR 64 | Resp 18 | Wt 292.0 lb

## 2014-12-13 DIAGNOSIS — T45515D Adverse effect of anticoagulants, subsequent encounter: Secondary | ICD-10-CM

## 2014-12-13 DIAGNOSIS — I2510 Atherosclerotic heart disease of native coronary artery without angina pectoris: Secondary | ICD-10-CM

## 2014-12-13 DIAGNOSIS — Z8679 Personal history of other diseases of the circulatory system: Secondary | ICD-10-CM

## 2014-12-13 DIAGNOSIS — I10 Essential (primary) hypertension: Secondary | ICD-10-CM

## 2014-12-13 DIAGNOSIS — I4892 Unspecified atrial flutter: Secondary | ICD-10-CM

## 2014-12-13 DIAGNOSIS — I519 Heart disease, unspecified: Secondary | ICD-10-CM

## 2014-12-13 DIAGNOSIS — E785 Hyperlipidemia, unspecified: Secondary | ICD-10-CM

## 2014-12-13 DIAGNOSIS — M4807 Spinal stenosis, lumbosacral region: Secondary | ICD-10-CM

## 2014-12-13 NOTE — Assessment & Plan Note (Signed)
BMI 38 

## 2014-12-13 NOTE — Assessment & Plan Note (Signed)
Treated

## 2014-12-13 NOTE — Assessment & Plan Note (Signed)
He needs back injection, held off secondary to toe infection

## 2014-12-13 NOTE — Progress Notes (Signed)
12/13/2014 Mitchell [name]   1934/10/29  [id number]  Primary Physician Elby Showers, MD Primary Cardiologist: Dr Gwenlyn Found  HPI:  80 year old obese male, father of 2, grandfather to 4 grandchildren, who is accompanied by his wife today. He is follwed by Dr Gwenlyn Found and Dr Renold Genta. He is active in the cattle business. He has a history of CAD status post remote PTI and stenting of his posterolateral branch March 18, 2000, with residual mild to moderate LAD and circumflex disease, and normal EF. Myoview was low risk in 2013. He underwent TEE-guided DC cardioversion by Dr. Sanda Klein, May 26, 2011, after being admitted with mild congestive heart failure secondary to atrial flutter with 2:1 block. He had LVD related to his atrial flutter, his EF went from 35-55%.   He has been doing well since that time. He has obstructive sleep apnea, intolerant to CPAP. He had been on Xarelto for paroxysmal atrial flutter. This was discontinued after a negative event monitor. He had also been having problems with is balance and had had a couple of falls. He apparently has some spinal stenosis. He sees a doctor in [place] for this. He was supposed to have a back injection but this had to be put off secondary to a toe infection.    Current Outpatient Prescriptions  Medication Sig Dispense Refill  . Acetaminophen (TYLENOL EXTRA STRENGTH PO) Take 1,000 mg by mouth every 6 (six) hours as needed (pain).     [name] amLODipine (NORVASC) 5 MG tablet TAKE 1 TABLET BY MOUTH DAILY. 30 tablet 11  . aspirin EC 325 MG tablet Take 1 tablet (325 mg total) by mouth daily. 30 tablet 0  . atenolol (TENORMIN) 25 MG tablet TAKE 1 TABLET BY MOUTH DAILY. 30 tablet 11  . calcium carbonate (OS-CAL) 600 MG TABS Take 600 mg by mouth 2 (two) times daily with a meal.      . carboxymethylcellulose (REFRESH PLUS) 0.5 % SOLN Place 1 drop into both eyes 3 (three) times daily as needed (dry eyes).    . furosemide (LASIX) 40 MG tablet  TAKE 1/2 TABLET BY MOUTH EVERY OTHER DAY. 30 tablet 11  . halobetasol (ULTRAVATE) 0.05 % cream Apply 1 application topically as needed (rash).     [name] levalbuterol (XOPENEX HFA) 45 MCG/ACT inhaler Inhale 2 puffs into the lungs every 4 (four) hours as needed for wheezing.    [name] levofloxacin (LEVAQUIN) 500 MG tablet Take 1 tablet (500 mg total) by mouth daily. 10 tablet 0  . meloxicam (MOBIC) 15 MG tablet TAKE 1 TABLET BY MOUTH DAILY. 30 tablet 5  . mupirocin ointment (BACTROBAN) 2 % Place 1 application into the nose 2 (two) times daily. 22 g 0  . pantoprazole (PROTONIX) 40 MG tablet TAKE 1 TABLET BY MOUTH DAILY. 30 tablet 6  . polyethylene glycol (MIRALAX / GLYCOLAX) packet Take 17 g by mouth daily.    . silver sulfADIAZINE (SILVADENE) 1 % cream Apply 1 application topically 2 (two) times daily. 50 g 0  . traMADol (ULTRAM) 50 MG tablet Take 1 tablet (50 mg total) by mouth 2 (two) times daily. 60 tablet 5  . XOPENEX HFA 45 MCG/ACT inhaler INHALE 2 PUFFS INTO THE LUNGS EVERY 4 HOURS AS NEEDED FOR WHEEZING. 15 g 12  . ZETIA 10 MG tablet TAKE 1 TABLET BY MOUTH ONCE DAILY. 30 tablet 11   No current facility-administered medications for this visit.    Allergies  Allergen Reactions  . Statins  Social History   Social History  . Marital Status: Single    Spouse Name: N/A  . Number of Children: N/A  . Years of Education: N/A   Occupational History  . Not on file.   Social History Main Topics  . Smoking status: Never Smoker   . Smokeless tobacco: Former Systems developer    Types: [place] date: 09/16/1972  . Alcohol Use: No  . Drug Use: No  . Sexual Activity: Not Currently   Other Topics Concern  . Not on file   Social History Narrative     Review of Systems: General: negative for chills, fever, night sweats or weight changes.  Cardiovascular: negative for chest pain, dyspnea on exertion, edema, orthopnea, palpitations, paroxysmal nocturnal dyspnea or shortness of  breath Dermatological: negative for rash Respiratory: negative for cough or wheezing Urologic: negative for hematuria Abdominal: negative for nausea, vomiting, diarrhea, bright red blood per rectum, melena, or hematemesis Neurologic: negative for visual changes, syncope, or dizziness Lt hip pain All other systems reviewed and are otherwise negative except as noted above.    Blood pressure 130/70, pulse 64, resp. rate 18, weight 292 lb (132.45 kg).  General appearance: alert, cooperative, no distress and moderately obese Neck: no carotid bruit and no JVD Lungs: clear to auscultation bilaterally Heart: regular rate and rhythm Abdomen: obese, large ventral hernia Extremities: 1+ edema Pulses: 2+ and symmetric Skin: Skin color, texture, turgor normal. No rashes or lesions Neurologic: Grossly normal  EKG NSR  ASSESSMENT AND PLAN:   Atrial flutter with RVR- s/p TEE CV Feb 2013 NSR, no palpitations  Anticoagulant causing adverse effect in therapeutic use Not a candidate for anticoagulation  LV dysfunction, EF 45% in AF- >55% May 2013 in NSR No CHF symptoms  HTN (hypertension), Controlled  Morbid obesity BMI 38  Hyperlipidemia Treated  Spinal stenosis He needs back injection, held off secondary to toe infection   PLAN  Same cardiac Rx. F/U Dr Gwenlyn Found in 6 months.  Kerin Ransom K PA-C 12/13/2014 9:18 AM

## 2014-12-13 NOTE — Assessment & Plan Note (Signed)
No CHF symptoms

## 2014-12-13 NOTE — Patient Instructions (Signed)
Medication Instructions:  Your physician recommends that you continue on your current medications as directed. Please refer to the Current Medication list given to you today.  Labwork: NONE  Testing/Procedures: NONE  Follow-Up: Your physician wants you to follow-up in: 6 months with Dr. Gwenlyn Found. You will receive a reminder letter in the mail two months in advance. If you don't receive a letter, please call our office to schedule the follow-up appointment.   Any Other Special Instructions Will Be Listed Below (If Applicable).

## 2014-12-13 NOTE — Assessment & Plan Note (Signed)
Not a candidate for anticoagulation

## 2014-12-13 NOTE — Assessment & Plan Note (Signed)
Controlled.  

## 2014-12-13 NOTE — Assessment & Plan Note (Signed)
NSR, no palpitations

## 2014-12-16 ENCOUNTER — Other Ambulatory Visit: Payer: Self-pay | Admitting: Internal Medicine

## 2014-12-17 ENCOUNTER — Encounter: Payer: Self-pay | Admitting: Internal Medicine

## 2014-12-17 NOTE — Patient Instructions (Signed)
Continue to apply Silvadene cream twice a day. Take Levaquin 500 mg daily x 10 days

## 2014-12-25 DIAGNOSIS — N37 Urethral disorders in diseases classified elsewhere: Secondary | ICD-10-CM | POA: Diagnosis not present

## 2014-12-25 DIAGNOSIS — R3912 Poor urinary stream: Secondary | ICD-10-CM | POA: Diagnosis not present

## 2015-01-07 DIAGNOSIS — R312 Other microscopic hematuria: Secondary | ICD-10-CM | POA: Diagnosis not present

## 2015-01-07 DIAGNOSIS — N37 Urethral disorders in diseases classified elsewhere: Secondary | ICD-10-CM | POA: Diagnosis not present

## 2015-01-07 DIAGNOSIS — N4340 Spermatocele of epididymis, unspecified: Secondary | ICD-10-CM | POA: Diagnosis not present

## 2015-01-08 DIAGNOSIS — M9983 Other biomechanical lesions of lumbar region: Secondary | ICD-10-CM | POA: Diagnosis not present

## 2015-01-08 DIAGNOSIS — M5416 Radiculopathy, lumbar region: Secondary | ICD-10-CM | POA: Diagnosis not present

## 2015-01-08 DIAGNOSIS — M545 Low back pain: Secondary | ICD-10-CM | POA: Diagnosis not present

## 2015-01-20 ENCOUNTER — Other Ambulatory Visit: Payer: Self-pay | Admitting: Internal Medicine

## 2015-01-23 DIAGNOSIS — N358 Other urethral stricture: Secondary | ICD-10-CM | POA: Diagnosis not present

## 2015-01-28 ENCOUNTER — Ambulatory Visit (INDEPENDENT_AMBULATORY_CARE_PROVIDER_SITE_OTHER): Payer: Medicare Other | Admitting: Internal Medicine

## 2015-01-28 DIAGNOSIS — Z23 Encounter for immunization: Secondary | ICD-10-CM

## 2015-02-04 ENCOUNTER — Ambulatory Visit (INDEPENDENT_AMBULATORY_CARE_PROVIDER_SITE_OTHER): Payer: Medicare Other | Admitting: Podiatry

## 2015-02-04 ENCOUNTER — Encounter: Payer: Self-pay | Admitting: Podiatry

## 2015-02-04 VITALS — BP 166/85 | HR 53 | Temp 97.5°F | Resp 14

## 2015-02-04 DIAGNOSIS — I2510 Atherosclerotic heart disease of native coronary artery without angina pectoris: Secondary | ICD-10-CM

## 2015-02-04 DIAGNOSIS — M21371 Foot drop, right foot: Secondary | ICD-10-CM

## 2015-02-04 DIAGNOSIS — L608 Other nail disorders: Secondary | ICD-10-CM

## 2015-02-04 DIAGNOSIS — L609 Nail disorder, unspecified: Secondary | ICD-10-CM | POA: Diagnosis not present

## 2015-02-04 NOTE — Progress Notes (Signed)
   Subjective:    Patient ID: Mitchell [name], male    DOB: 01-28-1935, 80 y.o.   MRN: [id number]  HPI   This patient presents today with his wife present in the treatment room and is toenail debridement. Previously patient was going to a pedicurist, however, patient describes a skin infection associated with with a pedicurist and his primary care physician suggested he present to a podiatrist. They said the infection resolved with local wound care and oral antibiotics. He denies any specific pain in the nail plates themselves other than difficulty trimming   Review of Systems  HENT: Positive for hearing loss.   Respiratory: Positive for wheezing.   Gastrointestinal: Positive for constipation.  Genitourinary: Positive for urgency and frequency.  Musculoskeletal: Positive for back pain and gait problem.       Joint pain  Skin:       Change in nails  Hematological: Bruises/bleeds easily.  All other systems reviewed and are negative.      Objective:   Physical Exam  Pleasant orientated 61 male presents with wife present  Vascular: DP 2/4 right and 1/4 left PT pulses 2/4 bilaterally Capillary reflex immediate bilaterally  Neurological: Sensation to 10 g monofilament wire intact 4/5 bilaterally Vibratory sensation intact bilaterally Ankle reflex equal and reactive bilaterally  Dermatological: Incurvated toenails 6-10 without any texture or color changes Mild varicosity ankles but  Musculoskeletal: Manual motor testing dorsi flexion right 3/5 right and 5/5 left Plantar flexion 5/5 bilaterally      Assessment & Plan:   Assessment: Incurvated non-dystrophic toenails 6-10 Right foot drop associated with patient's history of back disc disease with a history of dropfoot brace  Plan: I made patient aware that debrided toenails like this worse noncovered. Patient verbally consents to debridement today. I recommended he could have the nails trimmed by pedicurist without  soaking or manipulation of cuticles  Reappoint at patient's request

## 2015-02-04 NOTE — Patient Instructions (Signed)
Today your foot examination revealed slight weakness in the right foot, right foot drop Mild decrease in sensation in your feet Mildly incurvated toenails Dry blood underneath your right great toenail The toenails today were trimmed without any bleeding return as needed at your request

## 2015-02-12 DIAGNOSIS — M4806 Spinal stenosis, lumbar region: Secondary | ICD-10-CM | POA: Diagnosis not present

## 2015-02-12 DIAGNOSIS — R2689 Other abnormalities of gait and mobility: Secondary | ICD-10-CM | POA: Diagnosis not present

## 2015-02-12 DIAGNOSIS — M9983 Other biomechanical lesions of lumbar region: Secondary | ICD-10-CM | POA: Diagnosis not present

## 2015-02-12 DIAGNOSIS — M5416 Radiculopathy, lumbar region: Secondary | ICD-10-CM | POA: Diagnosis not present

## 2015-02-28 DIAGNOSIS — N358 Other urethral stricture: Secondary | ICD-10-CM | POA: Diagnosis not present

## 2015-02-28 DIAGNOSIS — N433 Hydrocele, unspecified: Secondary | ICD-10-CM | POA: Diagnosis not present

## 2015-02-28 DIAGNOSIS — Z7982 Long term (current) use of aspirin: Secondary | ICD-10-CM | POA: Diagnosis not present

## 2015-02-28 DIAGNOSIS — N432 Other hydrocele: Secondary | ICD-10-CM | POA: Diagnosis not present

## 2015-02-28 DIAGNOSIS — I2510 Atherosclerotic heart disease of native coronary artery without angina pectoris: Secondary | ICD-10-CM | POA: Diagnosis not present

## 2015-02-28 DIAGNOSIS — N35013 Post-traumatic anterior urethral stricture: Secondary | ICD-10-CM | POA: Diagnosis not present

## 2015-03-17 ENCOUNTER — Other Ambulatory Visit: Payer: Self-pay

## 2015-03-17 MED ORDER — TRAMADOL HCL 50 MG PO TABS: 50.0000 mg | ORAL_TABLET | Freq: Two times a day (BID) | ORAL | Status: DC

## 2015-03-24 DIAGNOSIS — E221 Hyperprolactinemia: Secondary | ICD-10-CM | POA: Diagnosis not present

## 2015-03-25 DIAGNOSIS — M5416 Radiculopathy, lumbar region: Secondary | ICD-10-CM | POA: Diagnosis not present

## 2015-03-25 DIAGNOSIS — M545 Low back pain: Secondary | ICD-10-CM | POA: Diagnosis not present

## 2015-03-25 DIAGNOSIS — M4806 Spinal stenosis, lumbar region: Secondary | ICD-10-CM | POA: Diagnosis not present

## 2015-03-27 DIAGNOSIS — E291 Testicular hypofunction: Secondary | ICD-10-CM | POA: Diagnosis not present

## 2015-03-27 DIAGNOSIS — M899 Disorder of bone, unspecified: Secondary | ICD-10-CM | POA: Diagnosis not present

## 2015-03-27 DIAGNOSIS — E221 Hyperprolactinemia: Secondary | ICD-10-CM | POA: Diagnosis not present

## 2015-03-27 DIAGNOSIS — D443 Neoplasm of uncertain behavior of pituitary gland: Secondary | ICD-10-CM | POA: Diagnosis not present

## 2015-04-08 ENCOUNTER — Ambulatory Visit (INDEPENDENT_AMBULATORY_CARE_PROVIDER_SITE_OTHER): Payer: Medicare Other | Admitting: Internal Medicine

## 2015-04-08 ENCOUNTER — Encounter: Payer: Self-pay | Admitting: Internal Medicine

## 2015-04-08 VITALS — BP 150/80 | HR 77 | Temp 98.1°F | Resp 20 | Ht 73.0 in | Wt 277.0 lb

## 2015-04-08 DIAGNOSIS — J209 Acute bronchitis, unspecified: Secondary | ICD-10-CM

## 2015-04-08 DIAGNOSIS — I2510 Atherosclerotic heart disease of native coronary artery without angina pectoris: Secondary | ICD-10-CM

## 2015-04-08 MED ORDER — CEFTRIAXONE SODIUM 1 G IJ SOLR
1.0000 g | INTRAMUSCULAR | Status: AC
  Administered 2015-04-08: 1 g via INTRAMUSCULAR

## 2015-04-08 MED ORDER — HYDROCODONE-HOMATROPINE 5-1.5 MG/5ML PO SYRP: 5.0000 mL | ORAL_SOLUTION | Freq: Three times a day (TID) | ORAL | Status: DC | PRN

## 2015-04-08 MED ORDER — LEVOFLOXACIN 500 MG PO TABS
500.0000 mg | ORAL_TABLET | Freq: Every day | ORAL | Status: DC
Start: 2015-04-08 — End: 2015-05-29

## 2015-04-08 NOTE — Progress Notes (Signed)
       Patient ID: Mitchell [name], male    DOB: 1935/03/21, 80 y.o.   MRN: [id number]  HPI 80 year old Male in with URI onset last Thursday, December 15 of cough and congestion . No fever or chills. Some discolored sputum production. Hx of asthma. To have urethral stricture surgery in near future.    Review of Systems     Objective:   Physical Exam  Constitutional: He is oriented to person, place, and time. He appears well-developed and well-nourished.  HENT:  Right Ear: External ear normal.  Left Ear: External ear normal.  Mouth/Throat: Oropharynx is clear and moist.  Eyes: Right eye exhibits no discharge. Left eye exhibits no discharge.  Neck: Neck supple.  Cardiovascular: Normal rate.   Pulmonary/Chest: Effort normal and breath sounds normal. He has no wheezes. He has no rales.  Lymphadenopathy:    He has no cervical adenopathy.  Neurological: He is alert and oriented to person, place, and time.  Skin: Skin is warm and dry.  Vitals reviewed.         Assessment & Plan:  Acute Bronchitis Hx of asthma Plan: Levaquin 500 mg twice daily x 10 days Symbicort 160 one spray twice daily. Hycodan one tsp po q 8 hours prn ciough

## 2015-04-08 NOTE — Patient Instructions (Signed)
Levaquin 500 mg daily x 10 days. Hycodan as needed for cough. Symbicort 160 q 12 hours. Start Prednisone if needed for wheezing

## 2015-04-08 NOTE — Addendum Note (Signed)
Addended by: Beryle Quant on: 04/08/2015 11:04 AM   Modules accepted: Orders

## 2015-04-21 ENCOUNTER — Other Ambulatory Visit: Payer: Self-pay | Admitting: Cardiovascular Disease

## 2015-04-22 ENCOUNTER — Telehealth: Payer: Self-pay | Admitting: Cardiovascular Disease

## 2015-04-22 NOTE — Telephone Encounter (Signed)
Left message for Mitchell [name] to call.

## 2015-04-22 NOTE — Telephone Encounter (Signed)
Spoke with ann, pt has urethral stricture and is needing surgery at [facility]. They are wanting the pt to be off his aspirin and meloxicam  10 to 12 days prior to surgery. They are asking for clearance and okay to hold medications. Will forward for dr berry's review.

## 2015-04-22 NOTE — Telephone Encounter (Signed)
Rx request sent to pharmacy.  

## 2015-04-22 NOTE — Telephone Encounter (Signed)
Pt is going to have surgery on 06-02-15.He need clearance to stop his medicine,she will give you all the details.

## 2015-04-23 ENCOUNTER — Encounter: Payer: Self-pay | Admitting: *Deleted

## 2015-04-23 NOTE — Telephone Encounter (Signed)
Spoke with ann, aware of dr berry's recommendations. Letter mailed to pt home address.

## 2015-04-23 NOTE — Telephone Encounter (Signed)
Left message for pt to call.

## 2015-04-23 NOTE — Telephone Encounter (Signed)
Okay to interrupt his antiplatelet therapy

## 2015-05-27 ENCOUNTER — Other Ambulatory Visit: Payer: Medicare Other | Admitting: Internal Medicine

## 2015-05-27 DIAGNOSIS — E785 Hyperlipidemia, unspecified: Secondary | ICD-10-CM

## 2015-05-27 DIAGNOSIS — Z125 Encounter for screening for malignant neoplasm of prostate: Secondary | ICD-10-CM | POA: Diagnosis not present

## 2015-05-27 DIAGNOSIS — Z0000 Encounter for general adult medical examination without abnormal findings: Secondary | ICD-10-CM

## 2015-05-27 DIAGNOSIS — Z87438 Personal history of other diseases of male genital organs: Secondary | ICD-10-CM | POA: Diagnosis not present

## 2015-05-27 DIAGNOSIS — I10 Essential (primary) hypertension: Secondary | ICD-10-CM | POA: Diagnosis not present

## 2015-05-27 LAB — CBC WITH DIFFERENTIAL/PLATELET
Basophils Absolute: 0 10*3/uL (ref 0.0–0.1)
Basophils Relative: 0 % (ref 0–1)
Eosinophils Absolute: 0.1 10*3/uL (ref 0.0–0.7)
Eosinophils Relative: 1 % (ref 0–5)
HCT: 43.6 % (ref 39.0–52.0)
Hemoglobin: 14.8 g/dL (ref 13.0–17.0)
Lymphocytes Relative: 17 % (ref 12–46)
Lymphs Abs: 1.5 10*3/uL (ref 0.7–4.0)
MCH: 32.5 pg (ref 26.0–34.0)
MCHC: 33.9 g/dL (ref 30.0–36.0)
MCV: 95.8 fL (ref 78.0–100.0)
MPV: 9.1 fL (ref 8.6–12.4)
Monocytes Absolute: 1.3 10*3/uL — ABNORMAL HIGH (ref 0.1–1.0)
Monocytes Relative: 15 % — ABNORMAL HIGH (ref 3–12)
Neutro Abs: 5.8 10*3/uL (ref 1.7–7.7)
Neutrophils Relative %: 67 % (ref 43–77)
Platelets: 210 10*3/uL (ref 150–400)
RBC: 4.55 MIL/uL (ref 4.22–5.81)
RDW: 14.1 % (ref 11.5–15.5)
WBC: 8.6 10*3/uL (ref 4.0–10.5)

## 2015-05-27 LAB — COMPLETE METABOLIC PANEL WITH GFR
ALT: 14 U/L (ref 9–46)
AST: 20 U/L (ref 10–35)
Albumin: 3.7 g/dL (ref 3.6–5.1)
Alkaline Phosphatase: 88 U/L (ref 40–115)
BUN: 13 mg/dL (ref 7–25)
CO2: 26 mmol/L (ref 20–31)
Calcium: 8.5 mg/dL — ABNORMAL LOW (ref 8.6–10.3)
Chloride: 101 mmol/L (ref 98–110)
Creat: 0.68 mg/dL — ABNORMAL LOW (ref 0.70–1.18)
GFR, Est African American: >89 mL/min (ref >=60)
GFR, Est Non African American: >89 mL/min (ref >=60)
Glucose, Bld: 102 mg/dL — ABNORMAL HIGH (ref 65–99)
Potassium: 4.1 mmol/L (ref 3.5–5.3)
Sodium: 141 mmol/L (ref 135–146)
Total Bilirubin: 0.4 mg/dL (ref 0.2–1.2)
Total Protein: 6.2 g/dL (ref 6.1–8.1)

## 2015-05-27 LAB — LIPID PANEL
Cholesterol: 157 mg/dL (ref 125–200)
HDL: 44 mg/dL (ref >=40)
LDL Cholesterol: 98 mg/dL (ref <130)
Total CHOL/HDL Ratio: 3.6 Ratio (ref <=5.0)
Triglycerides: 74 mg/dL (ref <150)
VLDL: 15 mg/dL (ref <30)

## 2015-05-28 LAB — PSA: PSA: 0.61 ng/mL (ref <=4.00)

## 2015-05-29 ENCOUNTER — Ambulatory Visit (INDEPENDENT_AMBULATORY_CARE_PROVIDER_SITE_OTHER): Payer: Medicare Other | Admitting: Internal Medicine

## 2015-05-29 ENCOUNTER — Encounter: Payer: Self-pay | Admitting: Internal Medicine

## 2015-05-29 VITALS — BP 112/68 | HR 74 | Temp 98.3°F | Resp 20 | Ht 73.0 in | Wt 264.0 lb

## 2015-05-29 DIAGNOSIS — M545 Low back pain: Secondary | ICD-10-CM | POA: Diagnosis not present

## 2015-05-29 DIAGNOSIS — Z0000 Encounter for general adult medical examination without abnormal findings: Secondary | ICD-10-CM | POA: Diagnosis not present

## 2015-05-29 DIAGNOSIS — J209 Acute bronchitis, unspecified: Secondary | ICD-10-CM | POA: Diagnosis not present

## 2015-05-29 DIAGNOSIS — I519 Heart disease, unspecified: Secondary | ICD-10-CM

## 2015-05-29 DIAGNOSIS — N400 Enlarged prostate without lower urinary tract symptoms: Secondary | ICD-10-CM | POA: Diagnosis not present

## 2015-05-29 DIAGNOSIS — E785 Hyperlipidemia, unspecified: Secondary | ICD-10-CM

## 2015-05-29 DIAGNOSIS — Z955 Presence of coronary angioplasty implant and graft: Secondary | ICD-10-CM

## 2015-05-29 DIAGNOSIS — N529 Male erectile dysfunction, unspecified: Secondary | ICD-10-CM

## 2015-05-29 DIAGNOSIS — Z8679 Personal history of other diseases of the circulatory system: Secondary | ICD-10-CM

## 2015-05-29 DIAGNOSIS — N359 Urethral stricture, unspecified: Secondary | ICD-10-CM | POA: Diagnosis not present

## 2015-05-29 DIAGNOSIS — Z85820 Personal history of malignant melanoma of skin: Secondary | ICD-10-CM

## 2015-05-29 DIAGNOSIS — G4733 Obstructive sleep apnea (adult) (pediatric): Secondary | ICD-10-CM | POA: Diagnosis not present

## 2015-05-29 DIAGNOSIS — Z87440 Personal history of urinary (tract) infections: Secondary | ICD-10-CM

## 2015-05-29 DIAGNOSIS — G8929 Other chronic pain: Secondary | ICD-10-CM

## 2015-05-29 DIAGNOSIS — D126 Benign neoplasm of colon, unspecified: Secondary | ICD-10-CM | POA: Diagnosis not present

## 2015-05-29 DIAGNOSIS — Z8709 Personal history of other diseases of the respiratory system: Secondary | ICD-10-CM

## 2015-05-29 DIAGNOSIS — E221 Hyperprolactinemia: Secondary | ICD-10-CM

## 2015-05-29 DIAGNOSIS — I10 Essential (primary) hypertension: Secondary | ICD-10-CM

## 2015-05-29 DIAGNOSIS — D352 Benign neoplasm of pituitary gland: Secondary | ICD-10-CM

## 2015-05-29 LAB — POCT URINALYSIS DIPSTICK
Bilirubin, UA: NEGATIVE
Blood, UA: NEGATIVE
Glucose, UA: NEGATIVE
Leukocytes, UA: NEGATIVE
Nitrite, UA: NEGATIVE
Spec Grav, UA: >=1.03
Urobilinogen, UA: 0.2
pH, UA: 5

## 2015-05-29 MED ORDER — CEFTRIAXONE SODIUM 1 G IJ SOLR
1.0000 g | INTRAMUSCULAR | Status: AC
  Administered 2015-05-29: 1 g via INTRAMUSCULAR

## 2015-05-29 MED ORDER — TRAMADOL HCL 50 MG PO TABS: 50.0000 mg | ORAL_TABLET | Freq: Three times a day (TID) | ORAL | Status: DC | PRN

## 2015-05-29 MED ORDER — LEVOFLOXACIN 500 MG PO TABS: 500.0000 mg | ORAL_TABLET | Freq: Every day | ORAL | Status: DC

## 2015-05-29 NOTE — Progress Notes (Addendum)
Subjective:    Patient ID: DUNTE BLINN, male    DOB: 1934/07/23, 81 y.o.   MRN: [id number]  HPI  Pt scheduled to have urethroplasty with possible harvest of buccal mucosa and possible hydrocelectomy at [facility] this coming Monday.  This past Sunday has come down with respiratory infection with low grade fever. No shaking chills. White sputum. No sore throat. Suggested that he defer the surgery until another time due to respiratory infection in conjunction with multiple medical issues.  He has a history of morbid obesity, hypertension, right coronary artery stent placement, sleep apnea, history of atrial flutter, history of melanoma, chronic musculoskeletal pain status post 2 hip into knee replacements, adenomatous colon polyp, history of asthma, pituitary microadenoma with hyperprolactinemia, erectile dysfunction, BPH. Weight February 2016 was 270 pounds and in 2015 was 272 pounds. Today weight is 264 pounds.  Last colonoscopy done March 2011 and adenomatous colon polyp was found. Colonoscopy done by Dr. Earle Gell.  History of angioplasty September 2001 with bare mental stent being placed the right coronary artery.  History of melanoma left neck September 2000. History of herpes zoster left trunk March 2000. Sees Dr. Chalmers Cater for pituitary microadenoma with prolactinemia and hypogonadism. Dr. Laurance Flatten in [place] is his orthopedist. Dr. Dayton Martes is dermatologist. Dr. Herbert Deaner is his ophthalmologist. Dr. Quay Burow is his cardiologist. Dr. Shirlyn Goltz sees him here for urology issues but anticipated urology surgery is going to be performed at [facility].  Patient has been having more issues recently with low back pain. He thinks he will return to [place] to have epidural steroid injections in the future.  Appendectomy 1958, right carpal tunnel release 1991, decompressive laminectomy L3-L4 and L4 -L5 for spinal stenosis September 1995. Umbilical hernia repair  1997. Left lower lobe pneumonia in 1996. Right DVT with ruptured Baker cyst requiring hospitalization February 1993.  History of intermittent hematuria which occurred prior to being on anticoagulation therapy. History of recurrent urinary infections from time to time which occur fairly suddenly.  He is statin intolerant and is currently on Zetia  Social history: Married with 2 adult children. Does not smoke. Self-employed cattleman. Does not consume alcohol.  For while he was on chronic anticoagulation but he chose to stop that. He does have falls from time to time related to impaired gait and balance. He has been to [facility] physical therapy to help with that.  Family history: A son and a daughter both with hypertension. Son has hyperlipidemia. Father died at age 101 for an MI. Mother died at age 53 of "old age ". One sister in good health.    Review of Systems see above     Objective:   Physical Exam  Constitutional: He is oriented to person, place, and time. He appears well-developed and well-nourished.  HENT:  Head: Normocephalic and atraumatic.  Right Ear: External ear normal.  Left Ear: External ear normal.  Mouth/Throat: Oropharynx is clear and moist. No oropharyngeal exudate.  Eyes: Conjunctivae and EOM are normal. Pupils are equal, round, and reactive to light. Right eye exhibits no discharge. Left eye exhibits no discharge. No scleral icterus.  Neck: Neck supple. No JVD present.  Cardiovascular: Normal rate, regular rhythm, normal heart sounds and intact distal pulses.   No murmur heard. Pulmonary/Chest: Effort normal and breath sounds normal. He has no wheezes.  Abdominal: Soft. Bowel sounds are normal. He exhibits no distension and no mass. There is no tenderness. There is no rebound and no  guarding.  Genitourinary:  deferred  Musculoskeletal: He exhibits no edema.  Lymphadenopathy:    He has no cervical adenopathy.  Neurological: He is alert and oriented to person,  place, and time. He has normal reflexes. No cranial nerve deficit.  Skin: Skin is warm and dry. No rash noted.  Psychiatric: He has a normal mood and affect. His behavior is normal. Judgment and thought content normal.  Vitals reviewed.         Assessment & Plan:  Acute bronchitis  Urethral strictore  Lumbar spinal stenosis  Chronic low back pain treated with tramadol  History of coronary artery disease followed Dr. Gwenlyn Found.  Obstructive sleep apnea  Morbid obesity  History of asthma  Pituitary microadenoma with hyperprolactinemia  Erectile dysfunction  BPH  Essential hypertension.   Hyperlipidemia treated with Zetia as he is statin intolerant  History of melanoma  History of atrial flutter  History of recurrent urinary infections  Plan: Postpone urethral surgery until over respiratory infection and can be rescheduled in a better time. He will likely proceed with epidural steroid injections in [place] for chronic back pain. Continue same medications and return in 6 months. Treat respiratory infection today  Subjective:   Patient presents for Medicare Annual/Subsequent preventive examination.  Review Past Medical/Family/Social:See above   Risk Factors  Current exercise habits: sedentary except for ADLs Dietary issues discussed: Low fat low carbohydrate  Cardiac risk factors: CAD, obesity, HTN,family Hx, impaired glucose tolerance, hyperlipicemia  Depression Screen  (Note: if answer to either of the following is "Yes", a more complete depression screening is indicated)   Over the past two weeks, have you felt down, depressed or hopeless? No  Over the past two weeks, have you felt little interest or pleasure in doing things? No Have you lost interest or pleasure in daily life? No Do you often feel hopeless? No Do you cry easily over simple problems? No   Activities of Daily Living  In your present state of health, do you have any difficulty performing  the following activities?:   Driving? No  Managing money? No  Feeding yourself? No  Getting from bed to chair? No  Climbing a flight of stairs? yes Preparing food and eating?: No  Bathing or showering? No  Getting dressed: No  Getting to the toilet? No  Using the toilet:No  Moving around from place to place: yes due to back pain and gait instability In the past year have you fallen or had a near fall?:yes Are you sexually active? No  Do you have more than one partner? No   Hearing Difficulties: No  Do you often ask people to speak up or repeat themselves? yes Do you experience ringing or noises in your ears? yes Do you have difficulty understanding soft or whispered voices? yes Do you feel that you have a problem with memory? No Do you often misplace items? No    Home Safety:  Do you have a smoke alarm at your residence? Yes Do you have grab bars in the bathroom?yes Do you have throw rugs in your house?no   Cognitive Testing  Alert? Yes Normal Appearance?Yes  Oriented to person? Yes Place? Yes  Time? Yes  Recall of three objects? Yes  Can perform simple calculations? Yes  Displays appropriate judgment?Yes  Can read the correct time from a watch face?Yes   List the Names of Other Physician/Practitioners you currently use:  See referral list for the physicians patient is currently seeing.  See dictation  Review of Systems:see above   Objective:     General appearance: Appears stated age and  obese  Head: Normocephalic, without obvious abnormality, atraumatic  Eyes: conj clear, EOMi PEERLA  Ears: normal TM's and external ear canals both ears  Nose: Nares normal. Septum midline. Mucosa normal. No drainage or sinus tenderness.  Throat: lips, mucosa, and tongue normal; teeth and gums normal  Neck: no adenopathy, no carotid bruit, no JVD, supple, symmetrical, trachea midline and thyroid not enlarged, symmetric, no tenderness/mass/nodules  No CVA tenderness.    Lungs: clear to auscultation bilaterally  Breasts: normal appearance, no masses or tenderness Heart: regular rate and rhythm, S1, S2 normal, no murmur, click, rub or gallop  Abdomen: soft, non-tender; bowel sounds normal; no masses, no organomegaly  Musculoskeletal: ROM normal in all joints, no crepitus, no deformity, Normal muscle strengthen. Back  is symmetric, no curvature. Skin: Skin color, texture, turgor normal. No rashes or lesions  Lymph nodes: Cervical, supraclavicular, and axillary nodes normal.  Neurologic: CN 2 -12 Normal, Normal symmetric reflexes. Normal coordination and gait  Psych: Alert & Oriented x 3, Mood appear stable.    Assessment:    Annual wellness medicare exam   Plan:    During the course of the visit the patient was educated and counseled about appropriate screening and preventive services including:   Annual flu vaccine     Patient Instructions (the written plan) was given to the patient.  Medicare Attestation  I have personally reviewed:  The patient's medical and social history  Their use of alcohol, tobacco or illicit drugs  Their current medications and supplements  The patient's functional ability including ADLs,fall risks, home safety risks, cognitive, and hearing and visual impairment  Diet and physical activities  Evidence for depression or mood disorders  The patient's weight, height, BMI, and visual acuity have been recorded in the chart. I have made referrals, counseling, and provided education to the patient based on review of the above and I have provided the patient with a written personalized care plan for preventive services.

## 2015-05-30 ENCOUNTER — Telehealth: Payer: Self-pay | Admitting: Internal Medicine

## 2015-05-30 NOTE — Telephone Encounter (Signed)
PT prescription to [facility] mailed to pt.

## 2015-06-03 ENCOUNTER — Encounter: Payer: Medicare Other | Admitting: Internal Medicine

## 2015-06-16 ENCOUNTER — Other Ambulatory Visit: Payer: Self-pay | Admitting: Internal Medicine

## 2015-06-25 DIAGNOSIS — M5416 Radiculopathy, lumbar region: Secondary | ICD-10-CM | POA: Diagnosis not present

## 2015-06-25 DIAGNOSIS — M545 Low back pain: Secondary | ICD-10-CM | POA: Diagnosis not present

## 2015-06-27 ENCOUNTER — Encounter: Payer: Self-pay | Admitting: *Deleted

## 2015-07-01 DIAGNOSIS — M545 Low back pain: Secondary | ICD-10-CM | POA: Diagnosis not present

## 2015-07-03 DIAGNOSIS — M545 Low back pain: Secondary | ICD-10-CM | POA: Diagnosis not present

## 2015-07-08 DIAGNOSIS — M545 Low back pain: Secondary | ICD-10-CM | POA: Diagnosis not present

## 2015-07-10 DIAGNOSIS — M545 Low back pain: Secondary | ICD-10-CM | POA: Diagnosis not present

## 2015-07-13 NOTE — Patient Instructions (Signed)
Levaquin 500 milligrams daily for 10 days. Continue same medications. Rescheduled surgery at [facility] is [facility] until after respiratory infection has resolved. Return here in 6 months.

## 2015-07-15 DIAGNOSIS — M545 Low back pain: Secondary | ICD-10-CM | POA: Diagnosis not present

## 2015-07-17 DIAGNOSIS — M545 Low back pain: Secondary | ICD-10-CM | POA: Diagnosis not present

## 2015-07-22 ENCOUNTER — Other Ambulatory Visit: Payer: Self-pay | Admitting: Internal Medicine

## 2015-07-22 DIAGNOSIS — M545 Low back pain: Secondary | ICD-10-CM | POA: Diagnosis not present

## 2015-07-23 ENCOUNTER — Other Ambulatory Visit: Payer: Self-pay | Admitting: Internal Medicine

## 2015-07-23 NOTE — Telephone Encounter (Signed)
Refill x 6 months 

## 2015-07-24 DIAGNOSIS — M545 Low back pain: Secondary | ICD-10-CM | POA: Diagnosis not present

## 2015-07-28 ENCOUNTER — Other Ambulatory Visit: Payer: Self-pay | Admitting: Cardiovascular Disease

## 2015-07-28 NOTE — Telephone Encounter (Signed)
Rx request sent to pharmacy.  

## 2015-07-29 DIAGNOSIS — M545 Low back pain: Secondary | ICD-10-CM | POA: Diagnosis not present

## 2015-07-31 DIAGNOSIS — M545 Low back pain: Secondary | ICD-10-CM | POA: Diagnosis not present

## 2015-08-05 DIAGNOSIS — M545 Low back pain: Secondary | ICD-10-CM | POA: Diagnosis not present

## 2015-08-07 DIAGNOSIS — M545 Low back pain: Secondary | ICD-10-CM | POA: Diagnosis not present

## 2015-08-11 DIAGNOSIS — M545 Low back pain: Secondary | ICD-10-CM | POA: Diagnosis not present

## 2015-08-12 ENCOUNTER — Ambulatory Visit (INDEPENDENT_AMBULATORY_CARE_PROVIDER_SITE_OTHER): Payer: Medicare Other | Admitting: Internal Medicine

## 2015-08-12 ENCOUNTER — Encounter: Payer: Self-pay | Admitting: Internal Medicine

## 2015-08-12 ENCOUNTER — Telehealth: Payer: Self-pay | Admitting: Internal Medicine

## 2015-08-12 VITALS — BP 154/88 | HR 64 | Temp 98.0°F | Resp 20 | Wt 283.0 lb

## 2015-08-12 DIAGNOSIS — M4806 Spinal stenosis, lumbar region: Secondary | ICD-10-CM

## 2015-08-12 DIAGNOSIS — M48061 Spinal stenosis, lumbar region without neurogenic claudication: Secondary | ICD-10-CM

## 2015-08-12 MED ORDER — DULOXETINE HCL 30 MG PO CPEP: 30.0000 mg | ORAL_CAPSULE | Freq: Every day | ORAL | Status: DC

## 2015-08-12 MED ORDER — LEVALBUTEROL TARTRATE 45 MCG/ACT IN AERO: 2.0000 | INHALATION_SPRAY | Freq: Four times a day (QID) | RESPIRATORY_TRACT | Status: DC | PRN

## 2015-08-12 MED ORDER — ATENOLOL 25 MG PO TABS
25.0000 mg | ORAL_TABLET | Freq: Every day | ORAL | Status: DC
Start: 2015-08-12 — End: 2015-09-29

## 2015-08-12 MED ORDER — PREDNISONE 10 MG PO TABS: ORAL_TABLET | ORAL | Status: DC

## 2015-08-12 MED ORDER — BUDESONIDE-FORMOTEROL FUMARATE 160-4.5 MCG/ACT IN AERO: 2.0000 | INHALATION_SPRAY | Freq: Two times a day (BID) | RESPIRATORY_TRACT | Status: DC

## 2015-08-12 MED ORDER — LEVALBUTEROL TARTRATE 45 MCG/ACT IN AERO: INHALATION_SPRAY | RESPIRATORY_TRACT | Status: DC

## 2015-08-12 NOTE — Telephone Encounter (Signed)
Pt's spouse called, pt is having significant back pain, please advise.  Best number to call pt is [phone] / lt

## 2015-08-12 NOTE — Telephone Encounter (Signed)
Please call and see what they want to do. He has been to [place] regarding this. Does he want to go back? Does he need to be seen today? Not sure what the issue is. He has Tramadol.

## 2015-08-12 NOTE — Telephone Encounter (Signed)
He is supposed to be having his urethra reconstructed next week. He did do physical therapy yesterday. She states that their daughter wants her to be seen today. I offered them a 4:00 and they will be here.

## 2015-08-13 NOTE — Progress Notes (Signed)
Subjective:    Patient ID: Mitchell [name], male    DOB: 09/27/1934, 81 y.o.   MRN: [id number]  HPI 81 year old White male who was to have urethral stricture surgery at [facility] in May. He has developed worsening low back pain. He is ambulating with a walker. 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 Says it is uncomfortable to sit there and it's difficult to manage stairs. He's been going to physical therapy at [place] but it hasn't helped his pain although it much. Is not able to tolerate hydrocodone. He is on tramadol for pain. He is able to drive short distances. He saw pain management physician in [place] recommended by his orthopedist there, Dr. Beckey Rutter. He had 2 epidural steroid injections which did not help. He has a history of lumbar spinal stenosis. He had decompressive laminectomy L4-L5 in 1995 by Dr. Glenna Fellows.  All of this has made him slightly depressed. He is accompanied today by wife and daughter. His daughter is a Animal nutritionist. She has mentioned prescribing an SSRI for him for depression and back pain. We are going to start him on Cymbalta 30 mg daily. Offered stronger pain medication such as Demerol since he cannot tolerate hydrocodone but she says that all pain medication seems to make him weepy. Does best with tramadol. Wife says he sleeping more. Says tramadol seems to knock him out.  He has a history of obstructive sleep apnea and morbid obesity.History of asthma.  His Cardiologist is Dr. Quay Burow. History of atrial flutter. Had angioplasty September 2001 with bare metal stent being placed in the right coronary artery.Was on chronic anticoagulation for time but no longer takes this.  History of asthma. History of pituitary microadenoma with hyperprolactinemia, erectile dysfunction, BPH.  Status post 2 hip and 2 knee replacements by orthopedists at [facility].  He does have falls from time to time related to impaired gait and balance.  Patient last had lumbar spine MRI at [facility] July 2016 showing significant facet joint arthrosis throughout the lumbar spine. Has issues with severe central canal stenosis L2-L3,  moderate narrowing of both neural foramina at L3-L4,  Severe narrowing of both neural foramina at L4-L5 and moderate narrowing central canal and lateral recesses.  L5-S1 with severe narrowing both neural foramina.   L3-L4 broad-based disc osteophyte complex and previous posterior decompression.     Review of Systems history of urinary infections related to urethral stricture     Objective:   Physical Exam Straight leg raising is negative at 90. When he walks his right foot turns inward. He does wear a brace. He has a right foot drop. Chest clear to auscultation. Skin warm and dry. Ambulates slowly with walker. Cardiac exam regular rate and rhythm normal S1 and S2. No lower extremity edema.       Assessment & Plan:  Lumbar spinal stenosis-going to continue with tramadol for pain. I have given him a 12 day tapering course of prednisone. Starting him on Cymbalta 30 mg daily. Schedule for MRI of the LS-spine in the very near future and neurosurgical consultation.  History of urethral stricture scheduled for surgery at [facility] long discussion with patient, his wife and his daughter we have decided to postpone that surgery. I think it's imperative he has an evaluation of his LS-spine by neurosurgeon  Depression related to impaired mobility  and pain  History of atrial flutter  Coronary disease status post bare metal stent placement. Cardiology issues are stable at this point  Essential hypertension  Morbid obesity  Hyperlipidemia  History of asthma  Obstructive sleep apnea  Pituitary microadenoma with hyperprolactinemia-followed by Dr. Chalmers [name], endocrinologist and  stable  45 minutes spent with patient and his family in addition to reviewing records from [facility]

## 2015-08-13 NOTE — Patient Instructions (Addendum)
Schedule for MRI of the LS spine and neurosurgical consultation.

## 2015-08-14 ENCOUNTER — Other Ambulatory Visit: Payer: Self-pay | Admitting: Internal Medicine

## 2015-08-14 DIAGNOSIS — M545 Low back pain: Secondary | ICD-10-CM | POA: Diagnosis not present

## 2015-08-14 NOTE — Addendum Note (Signed)
Addended by: Beryle Quant on: 08/14/2015 09:03 AM   Modules accepted: Orders

## 2015-08-19 ENCOUNTER — Ambulatory Visit
Admission: RE | Admit: 2015-08-19 | Discharge: 2015-08-19 | Disposition: A | Discharge status: Home / Self Care | Payer: Medicare Other | Source: Ambulatory Visit | Attending: Internal Medicine | Admitting: Internal Medicine

## 2015-08-19 DIAGNOSIS — M4806 Spinal stenosis, lumbar region: Secondary | ICD-10-CM | POA: Diagnosis not present

## 2015-08-19 DIAGNOSIS — M48061 Spinal stenosis, lumbar region without neurogenic claudication: Secondary | ICD-10-CM

## 2015-08-22 ENCOUNTER — Telehealth: Payer: Self-pay | Admitting: Internal Medicine

## 2015-08-22 ENCOUNTER — Other Ambulatory Visit: Payer: Self-pay | Admitting: Cardiovascular Disease

## 2015-08-22 NOTE — Telephone Encounter (Signed)
Hailey from Dr. Melven Sartorius office called with appointment information today.  They will see patient on Wednesday, 08/27/15 @ 12:00 noon.  Patient has been notified.

## 2015-08-25 DIAGNOSIS — M4316 Spondylolisthesis, lumbar region: Secondary | ICD-10-CM | POA: Diagnosis not present

## 2015-08-25 DIAGNOSIS — M545 Low back pain: Secondary | ICD-10-CM | POA: Diagnosis not present

## 2015-08-25 DIAGNOSIS — M5416 Radiculopathy, lumbar region: Secondary | ICD-10-CM | POA: Diagnosis not present

## 2015-08-25 DIAGNOSIS — M4806 Spinal stenosis, lumbar region: Secondary | ICD-10-CM | POA: Diagnosis not present

## 2015-08-27 DIAGNOSIS — Z6835 Body mass index (BMI) 35.0-35.9, adult: Secondary | ICD-10-CM | POA: Diagnosis not present

## 2015-08-27 DIAGNOSIS — I10 Essential (primary) hypertension: Secondary | ICD-10-CM | POA: Diagnosis not present

## 2015-08-27 DIAGNOSIS — M4806 Spinal stenosis, lumbar region: Secondary | ICD-10-CM | POA: Diagnosis not present

## 2015-08-27 DIAGNOSIS — M545 Low back pain: Secondary | ICD-10-CM | POA: Diagnosis not present

## 2015-08-27 DIAGNOSIS — M5416 Radiculopathy, lumbar region: Secondary | ICD-10-CM | POA: Diagnosis not present

## 2015-08-27 DIAGNOSIS — M5126 Other intervertebral disc displacement, lumbar region: Secondary | ICD-10-CM | POA: Diagnosis not present

## 2015-08-28 ENCOUNTER — Telehealth: Payer: Self-pay | Admitting: *Deleted

## 2015-08-28 ENCOUNTER — Other Ambulatory Visit: Payer: Self-pay | Admitting: Neurosurgery

## 2015-08-28 DIAGNOSIS — Z01818 Encounter for other preprocedural examination: Secondary | ICD-10-CM

## 2015-08-28 DIAGNOSIS — I10 Essential (primary) hypertension: Secondary | ICD-10-CM

## 2015-08-28 DIAGNOSIS — I2510 Atherosclerotic heart disease of native coronary artery without angina pectoris: Secondary | ICD-10-CM

## 2015-08-28 NOTE — Telephone Encounter (Signed)
Requesting surgical clearance: From [facility]  1. Type of surgery: lumbar surgery  2. Surgeon: Dr. Junious Silk. Williams  3. Surgical date: 09/09/15  4. Medications that need to be held: Aspirin  5. CAD: Yes  6. I will defer to: Dr Quay Burow

## 2015-08-29 NOTE — Telephone Encounter (Signed)
Needs pharmacologic Myoview stress test to risk stratify him prior to his lumbar surgery

## 2015-09-01 ENCOUNTER — Encounter: Payer: Self-pay | Admitting: Cardiovascular Disease

## 2015-09-01 NOTE — Telephone Encounter (Signed)
Called pt back and he gave verbal consent over the phone for me to speak to his wife due to the fact that he is [facility]. Explained to wife that pt need a myoview stress test prior to receiving a surgical clearance.  Pt/wife aware that someone will be calling to schedule the test.

## 2015-09-01 NOTE — Telephone Encounter (Signed)
Pt scheduled for Lexi on 5/24 and 5/25

## 2015-09-02 ENCOUNTER — Telehealth: Payer: Self-pay | Admitting: Internal Medicine

## 2015-09-02 NOTE — Telephone Encounter (Signed)
Pt to have surgery per Dr. Vertell Limber June 2nd. Is to have myocardial perfusion test may 24th.

## 2015-09-05 ENCOUNTER — Telehealth (HOSPITAL_COMMUNITY): Payer: Self-pay

## 2015-09-05 NOTE — Telephone Encounter (Signed)
Encounter complete. 

## 2015-09-10 ENCOUNTER — Other Ambulatory Visit (HOSPITAL_COMMUNITY): Payer: Medicare Other

## 2015-09-10 ENCOUNTER — Ambulatory Visit (HOSPITAL_COMMUNITY)
Admission: RE | Admit: 2015-09-10 | Discharge: 2015-09-10 | Disposition: A | Discharge status: Home / Self Care | Payer: Medicare Other | Source: Ambulatory Visit | Attending: Cardiovascular Disease | Admitting: Cardiovascular Disease

## 2015-09-10 DIAGNOSIS — J45909 Unspecified asthma, uncomplicated: Secondary | ICD-10-CM | POA: Diagnosis not present

## 2015-09-10 DIAGNOSIS — Z01818 Encounter for other preprocedural examination: Secondary | ICD-10-CM | POA: Insufficient documentation

## 2015-09-10 DIAGNOSIS — Z6837 Body mass index (BMI) 37.0-37.9, adult: Secondary | ICD-10-CM | POA: Diagnosis not present

## 2015-09-10 DIAGNOSIS — I2510 Atherosclerotic heart disease of native coronary artery without angina pectoris: Secondary | ICD-10-CM | POA: Diagnosis not present

## 2015-09-10 DIAGNOSIS — G4733 Obstructive sleep apnea (adult) (pediatric): Secondary | ICD-10-CM | POA: Diagnosis not present

## 2015-09-10 DIAGNOSIS — Z8249 Family history of ischemic heart disease and other diseases of the circulatory system: Secondary | ICD-10-CM | POA: Insufficient documentation

## 2015-09-10 DIAGNOSIS — E669 Obesity, unspecified: Secondary | ICD-10-CM | POA: Insufficient documentation

## 2015-09-10 DIAGNOSIS — I10 Essential (primary) hypertension: Secondary | ICD-10-CM

## 2015-09-10 MED ORDER — TECHNETIUM TC 99M TETROFOSMIN IV KIT
28.6000 | PACK | Freq: Once | INTRAVENOUS | Status: AC | PRN
  Administered 2015-09-10: 29 via INTRAVENOUS
  Filled 2015-09-10: qty 29

## 2015-09-10 MED ORDER — REGADENOSON 0.4 MG/5ML IV SOLN
0.4000 mg | Freq: Once | INTRAVENOUS | Status: AC
  Administered 2015-09-10: 0.4 mg via INTRAVENOUS

## 2015-09-11 ENCOUNTER — Telehealth: Payer: Self-pay | Admitting: Cardiovascular Disease

## 2015-09-11 ENCOUNTER — Ambulatory Visit (HOSPITAL_COMMUNITY)
Admission: RE | Admit: 2015-09-11 | Discharge: 2015-09-11 | Disposition: A | Discharge status: Home / Self Care | Payer: Medicare Other | Source: Ambulatory Visit | Attending: Cardiology | Admitting: Cardiology

## 2015-09-11 ENCOUNTER — Telehealth: Payer: Self-pay | Admitting: *Deleted

## 2015-09-11 ENCOUNTER — Other Ambulatory Visit (HOSPITAL_COMMUNITY): Payer: Self-pay | Admitting: *Deleted

## 2015-09-11 ENCOUNTER — Encounter (HOSPITAL_COMMUNITY): Payer: Self-pay

## 2015-09-11 ENCOUNTER — Encounter (HOSPITAL_COMMUNITY)
Admission: RE | Admit: 2015-09-11 | Discharge: 2015-09-11 | Disposition: A | Discharge status: Home / Self Care | Payer: Medicare Other | Source: Ambulatory Visit | Attending: Neurosurgery | Admitting: Neurosurgery

## 2015-09-11 DIAGNOSIS — Z01812 Encounter for preprocedural laboratory examination: Secondary | ICD-10-CM | POA: Insufficient documentation

## 2015-09-11 DIAGNOSIS — I5022 Chronic systolic (congestive) heart failure: Secondary | ICD-10-CM | POA: Diagnosis not present

## 2015-09-11 DIAGNOSIS — I2510 Atherosclerotic heart disease of native coronary artery without angina pectoris: Secondary | ICD-10-CM | POA: Insufficient documentation

## 2015-09-11 DIAGNOSIS — E221 Hyperprolactinemia: Secondary | ICD-10-CM | POA: Diagnosis not present

## 2015-09-11 DIAGNOSIS — I110 Hypertensive heart disease with heart failure: Secondary | ICD-10-CM | POA: Diagnosis not present

## 2015-09-11 DIAGNOSIS — Z955 Presence of coronary angioplasty implant and graft: Secondary | ICD-10-CM | POA: Insufficient documentation

## 2015-09-11 DIAGNOSIS — Z01818 Encounter for other preprocedural examination: Secondary | ICD-10-CM | POA: Insufficient documentation

## 2015-09-11 DIAGNOSIS — D352 Benign neoplasm of pituitary gland: Secondary | ICD-10-CM | POA: Diagnosis not present

## 2015-09-11 DIAGNOSIS — F329 Major depressive disorder, single episode, unspecified: Secondary | ICD-10-CM | POA: Insufficient documentation

## 2015-09-11 DIAGNOSIS — Z0183 Encounter for blood typing: Secondary | ICD-10-CM | POA: Insufficient documentation

## 2015-09-11 DIAGNOSIS — Z7951 Long term (current) use of inhaled steroids: Secondary | ICD-10-CM | POA: Insufficient documentation

## 2015-09-11 DIAGNOSIS — Z79899 Other long term (current) drug therapy: Secondary | ICD-10-CM | POA: Insufficient documentation

## 2015-09-11 DIAGNOSIS — F419 Anxiety disorder, unspecified: Secondary | ICD-10-CM | POA: Insufficient documentation

## 2015-09-11 DIAGNOSIS — M4806 Spinal stenosis, lumbar region: Secondary | ICD-10-CM | POA: Diagnosis not present

## 2015-09-11 DIAGNOSIS — Z96653 Presence of artificial knee joint, bilateral: Secondary | ICD-10-CM | POA: Insufficient documentation

## 2015-09-11 DIAGNOSIS — G4733 Obstructive sleep apnea (adult) (pediatric): Secondary | ICD-10-CM | POA: Insufficient documentation

## 2015-09-11 DIAGNOSIS — J45909 Unspecified asthma, uncomplicated: Secondary | ICD-10-CM | POA: Insufficient documentation

## 2015-09-11 DIAGNOSIS — Z85820 Personal history of malignant melanoma of skin: Secondary | ICD-10-CM | POA: Diagnosis not present

## 2015-09-11 HISTORY — DX: Anxiety disorder, unspecified: F41.9

## 2015-09-11 HISTORY — DX: Depression, unspecified: F32.A

## 2015-09-11 HISTORY — DX: Major depressive disorder, single episode, unspecified: F32.9

## 2015-09-11 LAB — MYOCARDIAL PERFUSION IMAGING
LV dias vol: 133 mL (ref 62–150)
LV sys vol: 60 mL
Peak HR: 85 {beats}/min
Rest HR: 60 {beats}/min
SDS: 2
SRS: 2
SSS: 4
TID: 0.88

## 2015-09-11 LAB — BASIC METABOLIC PANEL
Anion gap: 7 (ref 5–15)
BUN: 18 mg/dL (ref 6–20)
CO2: 28 mmol/L (ref 22–32)
Calcium: 9 mg/dL (ref 8.9–10.3)
Chloride: 101 mmol/L (ref 101–111)
Creatinine, Ser: 0.65 mg/dL (ref 0.61–1.24)
GFR calc Af Amer: >60 mL/min (ref >60)
GFR calc non Af Amer: >60 mL/min (ref >60)
Glucose, Bld: 96 mg/dL (ref 65–99)
Potassium: 4.5 mmol/L (ref 3.5–5.1)
Sodium: 136 mmol/L (ref 135–145)

## 2015-09-11 LAB — CBC
HCT: 45.2 % (ref 39.0–52.0)
Hemoglobin: 14.7 g/dL (ref 13.0–17.0)
MCH: 31.7 pg (ref 26.0–34.0)
MCHC: 32.5 g/dL (ref 30.0–36.0)
MCV: 97.6 fL (ref 78.0–100.0)
Platelets: 240 10*3/uL (ref 150–400)
RBC: 4.63 MIL/uL (ref 4.22–5.81)
RDW: 14.3 % (ref 11.5–15.5)
WBC: 9.2 10*3/uL (ref 4.0–10.5)

## 2015-09-11 LAB — TYPE AND SCREEN
ABO/RH(D): A POS
Antibody Screen: NEGATIVE

## 2015-09-11 LAB — SURGICAL PCR SCREEN
MRSA, PCR: NEGATIVE
Staphylococcus aureus: NEGATIVE

## 2015-09-11 LAB — ABO/RH: ABO/RH(D): A POS

## 2015-09-11 NOTE — Telephone Encounter (Signed)
Routing clearance to provided number.

## 2015-09-11 NOTE — Pre-Procedure Instructions (Signed)
    ICARUS BRANT  09/11/2015      [facility] - [place], [place] [address] Phone: [phone] Fax: [phone]  [place], [place] [address] Phone: [phone] Fax: [phone]    Your procedure is scheduled on 09-19-2015    Friday  .  Report to [facility] Admitting at  5:30A.M.   Call this number if you have problems the morning of surgery:  [phone]   Remember:  Do not eat food or drink liquids after midnight.   Take these medicines the morning of surgery with A SIP OF WATER Amlodipine(Norvasc),Atenolol(Tenormin),Symbicort inhaler,cymbalta,levalbuterol inhaler if needed,protonix,   Do not wear jewelry,   Do not wear lotions, powders, or perfumes.  You may NOT wear deodorant.  Do not shave 48 hours prior to surgery.  Men may shave face and neck.   Do not bring valuables to the hospital.  [facility] is not responsible for any belongings or valuables.  Contacts, dentures or bridgework may not be worn into surgery.  Leave your suitcase in the car.  After surgery it may be brought to your room.  For patients admitted to the hospital, discharge time will be determined by your treatment team.  Patients discharged the day of surgery will not be allowed to drive home.    Special instructions:  See Attached Sheet for instructions on CHG showers  Please read over the following fact sheets that you were given. MRSA Information and Surgical Site Infection Prevention

## 2015-09-11 NOTE — Telephone Encounter (Signed)
Lexiscan was done on 09/11/15 before final clearance to be given.  Requesting surgical clearance:   1. Type of surgery: L4-5 L5-S1 Redo Lumbar decompression and fusion with a L2-3 laminectomy  2. Surgeon: Dr Erline Levine  3. Surgical date: 09/19/2015  4. Medications that need to be held: n/a   5. CAD: yes     6. I will defer to: Dr Eugenia Mcalpine Neurosurgery and spine Attn:Surgery scheduling [phone] fax [phone] phone

## 2015-09-11 NOTE — Telephone Encounter (Signed)
F/u  Pt returning Rn phone call- myoview results. Please call back and discuss.   

## 2015-09-11 NOTE — Telephone Encounter (Signed)
Cleared for surgery at low risk. Low risk MV with nl LV fxn

## 2015-09-12 ENCOUNTER — Encounter (HOSPITAL_COMMUNITY): Payer: Self-pay | Admitting: Vascular Surgery

## 2015-09-12 NOTE — Progress Notes (Signed)
Anesthesia Chart Review: Patient is a 81 year old male scheduled for L4-5, L5-S1 redo decompression/fusion, L2-3 laminectomy with right microdiscectomy on 09/19/15 by Dr. Vertell Limber.  History includes non smoker, HTN, CAD s/p PLA stent '01, afib/flutter s/p cardioversion '13, chronic systolic CHF, OSA (improved off weight loss), depression, anxiety, prostatitis, urethral stricture, asthma, melanoma (neck), bilateral TKA, s/p UHR, back surgery, appendectomy. PCP notes mention that he also has a pituitary microadenoma with hyperprolactinemia that is followed by endocrinologist Dr. Chalmers Cater. BMI is consistent with obesity. PCP is listed as Dr. Tedra Senegal. Notes indicate that he has seen urologists Dr. Jeffie Pollock and Dr. Tresa Moore in [place] and Dr. Tempie Donning and Dr. Odis Luster at [name] for consideration of urethroplasty with buccal mucosa harvest, which appears to have been put off until after his back surgery.  Cardiologist is Dr. Quay Burow. Following a recent stress test, a clearance note was signed.   Meds include amlodipine, atenolol, Symbicort, Os-cal, Cymbalta, Lasix, Xopenex HFA, Protonix, tramadol, Zetia.   12/13/14 EKG: SR with first degree AVB.  09/11/15 Nuclear stress test:  The left ventricular ejection fraction is normal (55-65%).  Nuclear stress EF: 55%.  There was no ST segment deviation noted during stress.  The study is normal. No evidence of ischemia.  This is a low risk study.  04/29/13-05/28/13 Cardionet monitor: HR 41-139 bpm, average HR 61 bpm. There was no afib or aflutter. Xarelto discontinued.  08/24/11 Echo: LV borderline dilated. Normal LV wall thickness with normal systolic function. EF > 55%. Doppler flow pattern is suggestive of impaired LV relaxation. No regional wall motion abnormalities noted. RV systolic function is normal. LA mildly dilated. Interatrial septum is intact. Mild mitral annular calcification. No evidence of MVP. Trace MR. Trace TR. RVSP is normal. AV is trileaflet. Mild  AV sclerosis, but no AS. Trace AR. Trace PR. Borderline aortic root dilatation (3.9 cm). Normalization of LV function sinus rhythm since last study 05/2011 when in a-flutter with RVR.   03/18/00 Cardiac cath (Dr. Quay Burow): LM normal. Proximal LAD 40-50% hypodense stenosis overlapping the first septal branch, followed by tandem by a 70% tubular stenosis just before the second diagonal branch. Non-dominant CX with 50-60% mildly hypodense proximal stenosis before the first small marginal branch. Dominant RCA with moderate irregularities in the proximal and mid portions. 99% stenosis in the PLA just after the PDA takeoff. The PDA had 50-60% mildly hyperdense ostial/proximal stenosis. LVEF > 60% without focal wall motion abnormalities. Overall Impression: 1. Successful posterolateral coronary artery PCI and stenting using adjunctive glycoprotein IIb inhibitor. 2. Moderate proximal LAD disease, though this is tubular lesion and I think it can be treated medically, especially given the fact that the patient did not have chest pain and he only had mild anterolateral ischemia.  Preoperative labs noted.   If no acute changes then I would anticipate that he could proceed as planned.   [facility] Short Stay Center/Anesthesiology Phone [phone] 09/12/2015 1:43 PM

## 2015-09-12 NOTE — Telephone Encounter (Signed)
Returned call to wife, Lelon Frohlich, ok per DPR. Gave her the results of the myoview and notified her I had faxed clearance to doctor. She verbalized understanding.

## 2015-09-17 ENCOUNTER — Encounter (HOSPITAL_COMMUNITY): Payer: Self-pay | Admitting: *Deleted

## 2015-09-17 ENCOUNTER — Encounter: Payer: Self-pay | Admitting: Internal Medicine

## 2015-09-17 ENCOUNTER — Emergency Department (HOSPITAL_COMMUNITY): Payer: Medicare Other

## 2015-09-17 ENCOUNTER — Emergency Department (HOSPITAL_COMMUNITY)
Admission: EM | Admit: 2015-09-17 | Discharge: 2015-09-17 | Disposition: A | Discharge status: Home / Self Care | Payer: Medicare Other | Attending: Emergency Medicine | Admitting: Emergency Medicine

## 2015-09-17 ENCOUNTER — Ambulatory Visit (INDEPENDENT_AMBULATORY_CARE_PROVIDER_SITE_OTHER): Payer: Medicare Other | Admitting: Internal Medicine

## 2015-09-17 VITALS — BP 140/90 | HR 82

## 2015-09-17 DIAGNOSIS — M7989 Other specified soft tissue disorders: Secondary | ICD-10-CM | POA: Diagnosis not present

## 2015-09-17 DIAGNOSIS — N189 Chronic kidney disease, unspecified: Secondary | ICD-10-CM | POA: Diagnosis not present

## 2015-09-17 DIAGNOSIS — S99911A Unspecified injury of right ankle, initial encounter: Secondary | ICD-10-CM | POA: Diagnosis not present

## 2015-09-17 DIAGNOSIS — F418 Other specified anxiety disorders: Secondary | ICD-10-CM | POA: Diagnosis not present

## 2015-09-17 DIAGNOSIS — Z79899 Other long term (current) drug therapy: Secondary | ICD-10-CM | POA: Insufficient documentation

## 2015-09-17 DIAGNOSIS — I130 Hypertensive heart and chronic kidney disease with heart failure and stage 1 through stage 4 chronic kidney disease, or unspecified chronic kidney disease: Secondary | ICD-10-CM | POA: Insufficient documentation

## 2015-09-17 DIAGNOSIS — I5023 Acute on chronic systolic (congestive) heart failure: Secondary | ICD-10-CM | POA: Diagnosis not present

## 2015-09-17 DIAGNOSIS — Z791 Long term (current) use of non-steroidal anti-inflammatories (NSAID): Secondary | ICD-10-CM | POA: Diagnosis not present

## 2015-09-17 DIAGNOSIS — S8261XA Displaced fracture of lateral malleolus of right fibula, initial encounter for closed fracture: Secondary | ICD-10-CM | POA: Insufficient documentation

## 2015-09-17 DIAGNOSIS — Z96653 Presence of artificial knee joint, bilateral: Secondary | ICD-10-CM | POA: Diagnosis not present

## 2015-09-17 DIAGNOSIS — L509 Urticaria, unspecified: Secondary | ICD-10-CM | POA: Diagnosis not present

## 2015-09-17 DIAGNOSIS — S82891A Other fracture of right lower leg, initial encounter for closed fracture: Secondary | ICD-10-CM

## 2015-09-17 DIAGNOSIS — J45998 Other asthma: Secondary | ICD-10-CM | POA: Diagnosis not present

## 2015-09-17 DIAGNOSIS — Y939 Activity, unspecified: Secondary | ICD-10-CM | POA: Insufficient documentation

## 2015-09-17 DIAGNOSIS — Z87891 Personal history of nicotine dependence: Secondary | ICD-10-CM | POA: Insufficient documentation

## 2015-09-17 DIAGNOSIS — M25571 Pain in right ankle and joints of right foot: Secondary | ICD-10-CM | POA: Diagnosis not present

## 2015-09-17 DIAGNOSIS — Y92239 Unspecified place in hospital as the place of occurrence of the external cause: Secondary | ICD-10-CM | POA: Insufficient documentation

## 2015-09-17 DIAGNOSIS — I2510 Atherosclerotic heart disease of native coronary artery without angina pectoris: Secondary | ICD-10-CM

## 2015-09-17 DIAGNOSIS — S8991XA Unspecified injury of right lower leg, initial encounter: Secondary | ICD-10-CM | POA: Diagnosis not present

## 2015-09-17 DIAGNOSIS — S82899A Other fracture of unspecified lower leg, initial encounter for closed fracture: Secondary | ICD-10-CM | POA: Diagnosis not present

## 2015-09-17 DIAGNOSIS — S8251XA Displaced fracture of medial malleolus of right tibia, initial encounter for closed fracture: Secondary | ICD-10-CM | POA: Diagnosis not present

## 2015-09-17 DIAGNOSIS — Y999 Unspecified external cause status: Secondary | ICD-10-CM | POA: Diagnosis not present

## 2015-09-17 DIAGNOSIS — W1830XA Fall on same level, unspecified, initial encounter: Secondary | ICD-10-CM | POA: Insufficient documentation

## 2015-09-17 DIAGNOSIS — T148 Other injury of unspecified body region: Secondary | ICD-10-CM | POA: Diagnosis not present

## 2015-09-17 DIAGNOSIS — M25561 Pain in right knee: Secondary | ICD-10-CM | POA: Diagnosis not present

## 2015-09-17 MED ORDER — HYDROCODONE-ACETAMINOPHEN 5-325 MG PO TABS: 0.5000 | ORAL_TABLET | Freq: Three times a day (TID) | ORAL | Status: DC | PRN

## 2015-09-17 NOTE — ED Provider Notes (Signed)
CSN: [id number]     Arrival date & time 09/17/15  1427 History   First MD Initiated Contact with Patient 09/17/15 1430     Chief Complaint  Patient presents with  . Ankle Pain     (Consider location/radiation/quality/duration/timing/severity/associated sxs/prior Treatment) HPI Comments: Patient is a 81 year old male with history of spinal stenosis, right knee arthroplasty who presents with right ankle pain following mechanical fall. Patient was on his way into the doctor's office for an episode of hives, which has now resolved, when he lost his footing and tripped over his walker. The patient states he felt his knee "go out of joint" and then his ankle, and immediately felt pain in both. Patient denies hitting his head or losing consciousness. The patient was unable to bear weight on his ankle following the fall. Patient and family state that his ankle seemed "floppy"following the fall. Patient's pain was controlled with fentanyl given by EMS. Patient is scheduled to have back surgery by Dr. Vertell Limber on Friday for his spinal stenosis and neuropathy. Patient had a negative cardiac stress test last week in preparation for surgery. Patient denies any chest pain, shortness of breath, abdominal pain, nausea, vomiting, lightheadedness prior to fall, headache.  Patient is a 81 y.o. male presenting with ankle pain. The history is provided by the patient.  Ankle Pain Associated symptoms: back pain (baseline)   Associated symptoms: no fever     Past Medical History  Diagnosis Date  . Hypertension   . Coronary artery disease     2D ECHO, 08/24/2011 - EF >55%; NUCLEAR STRESS TEST, 04/21/2010 - mild ischemia in Basal Inferolateral and Mid Inferolateral regions, post-stress EF 56%,   . Asthma   . Arthritis   . Melanoma ([place])     l neck  . Adenomatous polyp   . Shortness of breath   . Prostatitis   . Dysrhythmia     atrial fibrilation  . Pneumonia   . Spinal stenosis   . Atrial flutter with rapid  ventricular response ([place]) 05/25/2011  . SOB (shortness of breath), secondary to SOB 05/25/2011  . LV dysfunction, EF by ECHO 40-45% 04/29/11 05/25/2011  . CHF, acute, mild secondary to EF 40-45% an a. flutter with RVR 05/25/2011  . Acute on chronic systolic heart failure, usually asymptomatic but with tachycardia HF became acute now resolved 05/26/2011  . Sleep apnea     no longer has since loss of 70 pounds  . Depression   . Anxiety   . Chronic kidney disease     uretheral stricture   Past Surgical History  Procedure Laterality Date  . Appendectomy    . Hernia repair      umbilical  . Joint replacement      left knee  . Joint replacement      left hip  . Lumbar laminectomy/decompression microdiscectomy    . Back surgery    . Coronary angioplasty    . Tee without cardioversion  05/26/2011    Procedure: TRANSESOPHAGEAL ECHOCARDIOGRAM (TEE);  Surgeon: Sanda Klein, MD;  Location: [facility];  Service: Cardiovascular;  Laterality: N/A;  . Cardioversion  05/26/2011    Procedure: CARDIOVERSION;  Surgeon: Sanda Klein, MD;  Location: [facility];  Service: Cardiovascular;  Laterality: N/A;  . Cardiac catheterization  03/18/2000    PDA between the first PLA branch stented with a 3x8 Guidant Penta stent resulting in reduction of a 99% stenosis to 0% residual  . Knee replacements Bilateral   . Rotator cuff repair  Left    Family History  Problem Relation Age of Onset  . Heart failure Mother 1  . Heart failure Father 32  . Cancer Paternal Grandfather    Social History  Substance Use Topics  . Smoking status: Never Smoker   . Smokeless tobacco: Former Systems developer    Types: [place] date: 09/16/1972  . Alcohol Use: No    Review of Systems  Constitutional: Negative for fever and chills.  HENT: Negative for facial swelling and sore throat.   Respiratory: Negative for shortness of breath.   Cardiovascular: Negative for chest pain.  Gastrointestinal: Negative for nausea, vomiting and  abdominal pain.  Genitourinary: Negative for dysuria.  Musculoskeletal: Positive for back pain (baseline), joint swelling and arthralgias.  Skin: Negative for rash and wound.  Neurological: Negative for headaches.  Psychiatric/Behavioral: The patient is not nervous/anxious.       Allergies  Statins and Adhesive  Home Medications   Prior to Admission medications   Medication Sig Start Date End Date Taking? Authorizing Provider  Acetaminophen (TYLENOL EXTRA STRENGTH PO) Take 1,000 mg by mouth every 8 (eight) hours as needed (pain).     Historical Provider, MD  amLODipine (NORVASC) 5 MG tablet TAKE 1 TABLET BY MOUTH DAILY. 01/20/15   Elby Showers, MD  atenolol (TENORMIN) 25 MG tablet Take 1 tablet (25 mg total) by mouth daily. 08/12/15   Elby Showers, MD  budesonide-formoterol [facility]) 160-4.5 MCG/ACT inhaler Inhale 2 puffs into the lungs 2 (two) times daily. 08/12/15   Elby Showers, MD  calcium carbonate (OS-CAL) 600 MG TABS Take 600 mg by mouth 2 (two) times daily with a meal. Reported on 05/29/2015    Historical Provider, MD  carboxymethylcellulose (REFRESH PLUS) 0.5 % SOLN Place 1 drop into both eyes 3 (three) times daily as needed (dry eyes).    Historical Provider, MD  DULoxetine (CYMBALTA) 30 MG capsule Take 1 capsule (30 mg total) by mouth daily. 08/12/15   Elby Showers, MD  furosemide (LASIX) 40 MG tablet TAKE 1/2 TABLET BY MOUTH EVERY OTHER DAY. Patient taking differently: TAKE 1/2 TABLET (20 mg) BY MOUTH EVERY OTHER DAY. 08/26/14   Lorretta Harp, MD  hydrocortisone cream 1 % Apply 1 application topically daily.    Historical Provider, MD  levalbuterol (XOPENEX HFA) 45 MCG/ACT inhaler INHALE 2 PUFFS INTO THE LUNGS EVERY 4 HOURS AS NEEDED FOR WHEEZING. 08/12/15   Elby Showers, MD  meloxicam (MOBIC) 15 MG tablet TAKE 1 TABLET BY MOUTH DAILY. 07/22/15   Elby Showers, MD  pantoprazole (PROTONIX) 40 MG tablet TAKE 1 TABLET BY MOUTH DAILY. 08/22/15   Lorretta Harp, MD  polyethylene  glycol [facility] Floria Raveling) packet Take 17 g by mouth at bedtime.     Historical Provider, MD  traMADol (ULTRAM) 50 MG tablet TAKE 1 TABLET BY MOUTH EVERY 8 HOURS AS NEEDED. Patient taking differently: TAKE 1 TABLET BY MOUTH EVERY 8 HOURS AS NEEDED for pain 07/23/15   Elby Showers, MD  ZETIA 10 MG tablet TAKE 1 TABLET BY MOUTH ONCE DAILY. 06/16/15   Elby Showers, MD   BP 136/77 mmHg  Pulse 75  Temp(Src) 98.3 F (36.8 C) (Oral)  Resp 16  SpO2 90% Physical Exam  Constitutional: He appears well-developed and well-nourished. No distress.  HENT:  Head: Normocephalic and atraumatic.  Mouth/Throat: Oropharynx is clear and moist. No oropharyngeal exudate.  Eyes: Conjunctivae are normal. Pupils are equal, round, and reactive  to light. Right eye exhibits no discharge. Left eye exhibits no discharge. No scleral icterus.  Neck: Normal range of motion. Neck supple. No thyromegaly present.  Cardiovascular: Normal rate, regular rhythm and normal heart sounds.  Exam reveals no gallop and no friction rub.   No murmur heard. Pulmonary/Chest: Effort normal and breath sounds normal. No stridor. No respiratory distress. He has no wheezes. He has no rales.  Abdominal: Soft. Bowel sounds are normal. He exhibits no distension. There is no tenderness. There is no rebound and no guarding.  Musculoskeletal: He exhibits no edema.  Tenderness to medial and lateral malleoli of right ankle as well as right anterior tibia; minimal edema noted in comparison to left ankle, no ecchymosis noted; 5/5 strength in bilateral lower extremities; normal sensation to light touch of lower extremities; patient able to plantarflex and dorsiflex right foot against resistance; no tenderness noted about the right knee or left knee, leg, or ankle  Lymphadenopathy:    He has no cervical adenopathy.  Neurological: He is alert. Coordination normal.  Skin: Skin is warm and dry. No rash noted. He is not diaphoretic. No pallor.  Psychiatric: He  has a normal mood and affect.  Nursing note and vitals reviewed.   ED Course  Procedures (including critical care time) Labs Review Labs Reviewed - No data to display  Imaging Review Dg Tibia/fibula Right  09/17/2015  CLINICAL DATA:  Pain following fall EXAM: RIGHT TIBIA AND FIBULA - 2 VIEW COMPARISON:  Right knee and right ankle radiographs obtained earlier in the day FINDINGS: Frontal and lateral views were obtained. There is a transversely oriented fracture of the lateral malleolus with mild displacement of fracture fragments in this area. There is a questionable avulsion in the medial malleolar region. The patient is status post total knee replacement femoral and tibial prosthetic components appearing well-seated. No other fracture is evident. No dislocation. There is moderate osteoarthritic change in the medial aspect of the ankle joint. There is no demonstrable knee joint effusion. There are foci of arterial vascular calcification throughout the right lower extremity. IMPRESSION: Transversely oriented fracture lateral malleolus with mild displacement of fracture fragments. Question avulsion in the medial malleolar region. Calcification in this area also could potentially be of arthropathic etiology. No other acute fracture. Status post total knee replacement. No dislocation. Osteoarthritic change medial aspect ankle joint region. Electronically Signed   By: Lowella Grip III M.D.   On: 09/17/2015 16:51   Dg Ankle Complete Right  09/17/2015  CLINICAL DATA:  Fall with right leg pain. EXAM: RIGHT ANKLE - COMPLETE 3+ VIEW COMPARISON:  09/17/2015 ankle and lower leg FINDINGS: Images of the right ankle have been obtained with the splint removed. Again noted is lucency and irregularity involving the distal lateral malleolus. This is suggestive for an acute fracture. There are small loose bodies or bone fragments along the medial malleolus. There is also a bone fragment along the dorsal aspect of the  navicular bone. This fragment along the dorsal aspect of the navicular bone is suggestive for an avulsion injury. Ankle is located. Diffuse soft tissue swelling in the ankle, most prominent along the lateral aspect. IMPRESSION: Minimally displaced fracture involving the lateral malleolus. There is associated lateral soft tissue swelling. Age-indeterminate avulsion fracture along the dorsal aspect of the navicular bone. Possible old injury involving the medial malleolus. Electronically Signed   By: Markus Daft M.D.   On: 09/17/2015 16:43   Dg Ankle Complete Right  09/17/2015  CLINICAL DATA:  Tripped  on curb with right ankle pain and swelling. EXAM: RIGHT ANKLE - COMPLETE 3+ VIEW COMPARISON:  None. FINDINGS: The right ankle is in a splint which limits the bone detail on the study. There are small loose bodies or bone fragments along the medial aspect of the ankle near the medial malleolus. There are also small bone fragments and lucency involving the lateral malleolus. Evidence for soft tissue swelling. Ankle is located. Small spur along the plantar aspect of the calcaneus. IMPRESSION: Multiple small loose bodies involving the medial malleolus and lateral malleolus. Difficult to differentiate acute versus chronic injuries in this area. Repeat imaging without the splint may be useful to better characterize these findings. Electronically Signed   By: Markus Daft M.D.   On: 09/17/2015 15:27   Dg Knee Complete 4 Views Right  09/17/2015  CLINICAL DATA:  Pain following fall EXAM: RIGHT KNEE - COMPLETE 4+ VIEW COMPARISON:  None. FINDINGS: Frontal, lateral, and bilateral oblique views were obtained. The patient is status post total knee replacement with femoral and tibial prosthetic components appearing well-seated. There is no fracture or dislocation. No joint effusion. No erosive change. Calcification along the anterior aspects of the patella superiorly and inferiorly probably are due to calcific tendinosis. IMPRESSION:  Prosthetic components appear well seated. No fracture or dislocation. No appreciable joint effusion. Electronically Signed   By: Lowella Grip III M.D.   On: 09/17/2015 15:22   I have personally reviewed and evaluated these images and lab results as part of my medical decision-making.   EKG Interpretation None      MDM   Right ankle x-ray shows minimally displaced transverse fracture involving the lateral malleolus and age indeterminate avulsion fracture along the dorsal aspect of the navicular bone; and possible old injury involving the medial malleolus. Right tib-fib fracture shows no dislocation and fracture mentioned above. Ankle splint ordered and pending placement. I spoke with the Dr. Saintclair Halsted of the neurosurgery, who would like the patient to be discharged home as planned and Dr. Vertell Limber will follow-up with the family tomorrow regarding the patient's scheduled surgery for Friday. At shift change, patient care transferred to [facility], PA-C for continued evaluation, follow up of splint application and ability to complete wheelchair transfer and determination of disposition. Anticipate discharge if able to complete wheelchair transfer. Anticipate admission or transfer to assisted living facility if patient is unable to complete wheelchair transfer.   Final diagnoses:  Malleolar fracture, right, closed, initial encounter        Frederica Kuster, PA-C 09/17/15 [place], MD 09/18/15 [phone]

## 2015-09-17 NOTE — Progress Notes (Addendum)
   Subjective:    Patient ID: Mitchell [name], male    DOB: [birth year], 81 y.o.   MRN: [id number]  HPI Patient is scheduled to have back surgery with Dr. Vertell Limber this coming Friday, June 2. His wife called around 1 PM saying that he had developed urticaria. She had given him Benadryl. Said she was bringing him immediately to the office. He was not in respiratory distress apparently. When they got to the parking lot of the office, he suffered a fall in the parking lot. When I arrived he was on the sidewalk. His right ankle was swollen. EMS personnel were contacted and arrived on scene to assess him. He was transported to the hospital. His vital signs were stable.    Review of Systems     Objective:   Physical Exam Pulse is regular. He is alert and oriented. No evidence of urticaria whatsoever. Perhaps Benadryl had begun to work by this time. Blood pressure is stable. Pulse ox is normal.       Assessment & Plan:  Right ankle injury secondary to fall   Urticaria  Lumbar spinal stenosis  Plan: Transport to hospital for further evaluation of ankle injury  No evidence of urticaria at present time.  Addendum: Patient was evaluated in emergency department. He apparently has no acute fracture/displacement of knee arthroplasty or ankle. Records faxed to Dr. Vertell Limber.

## 2015-09-17 NOTE — ED Provider Notes (Signed)
Care assumed from PA Law at shift change. Patient with ankle fracture which will be splinted with ortho follow up as outpatient. Will re-evaluate patient after splint placement and ensure patient is able to transfer into wheelchair and is safe for discharge.  Patient evaluated following splint placement - splint is comfortable, able to wiggle toes, no complaints. Patient safely transitioned into wheelchair. Spoke with family about home care and they feel comfortable with dc to home. Follow up care and return precautions discussed. All questions answered.   [facility] Deetra Booton, PA-C 09/17/15 1941  Isla Pence, MD 09/17/15 [phone]

## 2015-09-17 NOTE — ED Notes (Signed)
Ortho tech applying splint

## 2015-09-17 NOTE — ED Notes (Addendum)
Pt took home pain medication-tramadol 50 ok per PA Ward.  Pt also given [place] sandwich and drink.  Family also given drink.

## 2015-09-17 NOTE — Discharge Instructions (Signed)
Please follow-up with Dr. Melven Sartorius office tomorrow morning for more information regarding your surgery on Friday. Please follow-up with Dr. Phoebe Sharps office within 1 week for further evaluation and treatment of your ankle fracture - earlier if recommended by Dr. Vertell Limber. Do not bear weight and use your wheelchair all the time except for transfers. Please return to the emergency department if you develop any new or worsening symptoms.

## 2015-09-17 NOTE — ED Notes (Signed)
Pt presents via [facility] for right ankle pain.  Pt went to [place] for hives this AM and tripped on the curb.  Pt with right ankle pain and swelling.  Arrives with ankle splinted.  Pt received 250 fentanyl en route, decreasing pain.  Pt scheduled for back surgery Friday.  Pt also reports right knee cap popped out of place but went back in-hx of BL knee and BL hip replacements.  A x 4, NAD.

## 2015-09-17 NOTE — Progress Notes (Signed)
Orthopedic Tech Progress Note Patient Details:  Mitchell [name] [birth year] [id number]  Ortho Devices Type of Ortho Device: Ace wrap, Post (short leg) splint, Stirrup splint Ortho Device/Splint Location: RLE Ortho Device/Splint Interventions: Ordered, Application   Braulio Bosch 09/17/2015, 6:59 PM

## 2015-09-17 NOTE — ED Notes (Signed)
Attempted to contact ortho-no answer at this time.

## 2015-09-17 NOTE — Patient Instructions (Signed)
Transported by EMS to Hospital for evaluation of right ankle injury.

## 2015-09-18 ENCOUNTER — Telehealth: Payer: Self-pay | Admitting: Internal Medicine

## 2015-09-18 NOTE — Telephone Encounter (Signed)
Daughter is calling to advise that patient has appointment Ortho:  Dr. Eduard Roux @ [facility] on Monday, 6/5 @ 4:15.  Dr. is in surgery today and they are not able to advise whether or not patient will have surgery or not next week.  Family is concerned whether patient should continue to be off of his anticoagulation.  He has been off of Meloxicam and Aspirin for 2 weeks in prep for surgery.  Back surgery is cancelled now per Dr. Vertell Limber.  So, family wants to know if he can re-start his Meloxicam and Aspirin?  This would help his pain.  Advised we are unsure as to whether he will have surgery or not we will have to defer to Ortho office.      Has a script for 4 codeine tablets but they haven't filled that.  States that narcotics make him feel anxious and they make it more difficult for him to ambulate and it's already difficult for him to get around, so patient doesn't want to take these.  He's currently taking Tylenol TID and Tramadol TID.  However, his pain is not very well controlled.  Last night he was up every couple of hours.  Webb Silversmith advised she felt like his pain was more from his back because once he sat up on the side of the bed, the pain would get better.    (We can contact Cecille Rubin, but it is fine to contact [facility] with information.  [phone] is Lori's cell #.  She will be at work this afternoon.  [phone] is work # after 2pm)  I am faxing patient's information to Dr. Jamelle Haring office # [phone] for appointment on Monday, 6/5 @ 4:15.  I am also asking them to please advise what is the likelihood of surgery to see if we can get any response on behalf of the patient for surgical purposes as well.    Please advise if there's anything in terms of pain control that we can do further or differently for patient before Monday?  They feel that him continuing to be off of his anticoagulants and the Meloxicam until Monday is not necessary if he's not going to have surgery.

## 2015-09-18 NOTE — Telephone Encounter (Signed)
Patient needs to hear from Ortho about possible surgery before Meloxicam and ASA can be restarted. ASA has long effect on platelets-  About 21 days  so likely still has effect. If he cannot tolerate narcotics, what do they want to use for pain control? Nothing other than Tramadol. We have been through this with daughter before. Says narcotics make him weepy. Offered Demerol before but they would need to pick up RX

## 2015-09-19 ENCOUNTER — Inpatient Hospital Stay (HOSPITAL_COMMUNITY): Admission: RE | Admit: 2015-09-19 | Payer: Medicare Other | Source: Ambulatory Visit | Admitting: Neurosurgery

## 2015-09-19 ENCOUNTER — Encounter (HOSPITAL_COMMUNITY): Admission: RE | Payer: Self-pay | Source: Ambulatory Visit

## 2015-09-19 SURGERY — POSTERIOR LUMBAR FUSION 2 LEVEL
Anesthesia: General | Site: Back

## 2015-09-23 ENCOUNTER — Other Ambulatory Visit: Payer: Self-pay | Admitting: Cardiovascular Disease

## 2015-09-23 DIAGNOSIS — S8261XA Displaced fracture of lateral malleolus of right fibula, initial encounter for closed fracture: Secondary | ICD-10-CM | POA: Diagnosis not present

## 2015-09-29 ENCOUNTER — Other Ambulatory Visit: Payer: Self-pay | Admitting: Internal Medicine

## 2015-10-07 DIAGNOSIS — S8261XD Displaced fracture of lateral malleolus of right fibula, subsequent encounter for closed fracture with routine healing: Secondary | ICD-10-CM | POA: Diagnosis not present

## 2015-10-08 ENCOUNTER — Other Ambulatory Visit: Payer: Self-pay | Admitting: Neurosurgery

## 2015-10-16 ENCOUNTER — Encounter (HOSPITAL_COMMUNITY)
Admission: RE | Admit: 2015-10-16 | Discharge: 2015-10-16 | Disposition: A | Discharge status: Home / Self Care | Payer: Medicare Other | Source: Ambulatory Visit | Attending: Neurosurgery | Admitting: Neurosurgery

## 2015-10-16 ENCOUNTER — Encounter (HOSPITAL_COMMUNITY): Payer: Self-pay

## 2015-10-16 DIAGNOSIS — Z01812 Encounter for preprocedural laboratory examination: Secondary | ICD-10-CM | POA: Insufficient documentation

## 2015-10-16 DIAGNOSIS — Z0183 Encounter for blood typing: Secondary | ICD-10-CM | POA: Insufficient documentation

## 2015-10-16 DIAGNOSIS — M4806 Spinal stenosis, lumbar region: Secondary | ICD-10-CM | POA: Diagnosis not present

## 2015-10-16 HISTORY — DX: Personal history of other medical treatment: Z92.89

## 2015-10-16 HISTORY — DX: Constipation, unspecified: K59.00

## 2015-10-16 HISTORY — DX: Other fracture of unspecified lower leg, initial encounter for closed fracture: S82.899A

## 2015-10-16 HISTORY — DX: Gastro-esophageal reflux disease without esophagitis: K21.9

## 2015-10-16 HISTORY — DX: Hyperlipidemia, unspecified: E78.5

## 2015-10-16 LAB — CBC
HCT: 44.3 % (ref 39.0–52.0)
Hemoglobin: 14.3 g/dL (ref 13.0–17.0)
MCH: 31.6 pg (ref 26.0–34.0)
MCHC: 32.3 g/dL (ref 30.0–36.0)
MCV: 98 fL (ref 78.0–100.0)
Platelets: 260 10*3/uL (ref 150–400)
RBC: 4.52 MIL/uL (ref 4.22–5.81)
RDW: 13.6 % (ref 11.5–15.5)
WBC: 8.1 10*3/uL (ref 4.0–10.5)

## 2015-10-16 LAB — TYPE AND SCREEN
ABO/RH(D): A POS
Antibody Screen: NEGATIVE

## 2015-10-16 LAB — BASIC METABOLIC PANEL
Anion gap: 6 (ref 5–15)
BUN: 11 mg/dL (ref 6–20)
CO2: 28 mmol/L (ref 22–32)
Calcium: 8.7 mg/dL — ABNORMAL LOW (ref 8.9–10.3)
Chloride: 102 mmol/L (ref 101–111)
Creatinine, Ser: 0.6 mg/dL — ABNORMAL LOW (ref 0.61–1.24)
GFR calc Af Amer: >60 mL/min (ref >60)
GFR calc non Af Amer: >60 mL/min (ref >60)
Glucose, Bld: 108 mg/dL — ABNORMAL HIGH (ref 65–99)
Potassium: 4.3 mmol/L (ref 3.5–5.1)
Sodium: 136 mmol/L (ref 135–145)

## 2015-10-16 LAB — SURGICAL PCR SCREEN
MRSA, PCR: NEGATIVE
Staphylococcus aureus: NEGATIVE

## 2015-10-16 MED ORDER — CHLORHEXIDINE GLUCONATE CLOTH 2 % EX PADS: 6.0000 | MEDICATED_PAD | Freq: Once | CUTANEOUS | Status: DC

## 2015-10-16 NOTE — Pre-Procedure Instructions (Signed)
Mitchell [name]  10/16/2015      [facility] - [place], [place] - [place] [address] Phone: [phone] Fax: [phone]  [place], [place] [address] Phone: [phone] Fax: [phone]    Your procedure is scheduled on Fri, July 7 @ 3:00 PM  Report to [facility] Admitting at 12:00 PM  Call this number if you have problems the morning of surgery:  [phone]   Remember:  Do not eat food or drink liquids after midnight.  Take these medicines the morning of surgery with A SIP OF WATER Amlodipine(Norvasc),Atenolol(Tenormin),Symbicort<Bring Your Inhaler With You>,Cymbalta(Duloxetine),Pain Pill(if needed),and Pantoprazole(Protonix)              Stop taking your Mobic along with any Vitamins or Herbal Medications a week prior to surgery. No Goody's,BC's,Aleve,Advil,Motrin,Ibuprofen,or Fish Oil.   Do not wear jewelry.  Do not wear lotions, powders, or colognes.    Men may shave face and neck.  Do not bring valuables to the hospital.  [facility] is not responsible for any belongings or valuables.  Contacts, dentures or bridgework may not be worn into surgery.  Leave your suitcase in the car.  After surgery it may be brought to your room.  For patients admitted to the hospital, discharge time will be determined by your treatment team.  Patients discharged the day of surgery will not be allowed to drive home.    [facility]  Before surgery, you can play an important role.  Because skin is not sterile, your skin needs to be as free of germs as possible.  You can reduce the number of germs on you skin by washing with CHG (chlorahexidine gluconate) soap before surgery.  CHG is an antiseptic cleaner which kills germs and bonds with the skin to continue killing germs even after washing.  Please DO NOT use  if you have an allergy to CHG or antibacterial soaps.  If your skin becomes reddened/irritated stop using the CHG and inform your nurse when you arrive at Short Stay.  Do not shave (including legs and underarms) for at least 48 hours prior to the first CHG shower.  You may shave your face.  Please follow these instructions carefully:   1.  Shower with CHG Soap the night before surgery and the                                morning of Surgery.  2.  If you choose to wash your hair, wash your hair first as usual with your       normal shampoo.  3.  After you shampoo, rinse your hair and body thoroughly to remove the                      Shampoo.  4.  Use CHG as you would any other liquid soap.  You can apply chg directly       to the skin and wash gently with scrungie or a clean washcloth.  5.  Apply the CHG Soap to your body ONLY FROM THE NECK DOWN.        Do not use on open wounds or open sores.  Avoid contact with your eyes,       ears, mouth  and genitals (private parts).  Wash genitals (private parts)       with your normal soap.  6.  Wash thoroughly, paying special attention to the area where your surgery        will be performed.  7.  Thoroughly rinse your body with warm water from the neck down.  8.  DO NOT shower/wash with your normal soap after using and rinsing off       the CHG Soap.  9.  Pat yourself dry with a clean towel.            10.  Wear clean pajamas.            11.  Place clean sheets on your bed the night of your first shower and do not        sleep with pets.  Day of Surgery  Do not apply any lotions/deoderants the morning of surgery.  Please wear clean clothes to the hospital/surgery center.  :    Please read over the following fact sheets that you were given. Pain Booklet, Coughing and Deep Breathing, MRSA Information and Surgical Site Infection Prevention

## 2015-10-23 MED ORDER — DEXTROSE 5 % IV SOLN
3.0000 g | INTRAVENOUS | Status: AC
  Administered 2015-10-24: 3 g via INTRAVENOUS
  Administered 2015-10-24: 2 g via INTRAVENOUS
  Filled 2015-10-23: qty 3000

## 2015-10-23 NOTE — H&P (Signed)
Patient ID:   [id number] Patient: Mitchell [name]  Date of Birth: 08/03/1934 Visit Type: Office Visit   Date: 08/27/2015 12:00 PM Provider: Marchia Meiers. Vertell Limber MD   This 81 year old male presents for back pain and leg pain.  History of Present Illness: 1.  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  Patient and family note left greater than right leg pain has been increasing over the past several months, but profound weakness preventing ambulation without maximum assistance has increased over the last three weeks. Drop foot on the right for over a decade. Was able to drive 3 weeks ago. Pain radiates from bilateral thighs to knees. Symptoms have been progressing since last December.  Physical therapy 2 years, currently home exercises continue [facility] December and March offered little relief  Tramadol 50 mg one 3 times a day Tylenol 650 mg two 3 times a day Meloxicam 15 mg one daily Prednisone taper offered no relief  History: HTN, asthma, arthritis, melanoma neck, atrial flutter converted/irregular heart rate, pituitary adenoma(stable) urethral stricture(surgery planned) Surgical history: Appendectomy, umbilical hernia repair, lumbar surgery by Dr. Carloyn Manner all 20+ years ago; left rotator cuff repair 2012, both hips replaced 2008 and 2009, both knees replaced in 1999 and 2000  Dr. Gwenlyn Found is patient's cardiologist Urethral stricture repair was planned at [facility], placed on hold for evaluation of lumbar spine  MRI on [place]    2.  leg pain      PAST MEDICAL/SURGICAL HISTORY   (Detailed)  Disease/disorder Onset Date Management Date Comments  Asthma    AGW 08/26/2015 -  Atrial flutter    AGW 08/26/2015 -  Hyperlipemia    AGW 08/26/2015 -  Hypertension      Morbid obesity    AGW 08/26/2015 -  sleep apnea    AGW 08/26/2015 -  Spinal stenosis    AGW 08/26/2015 -      PAST MEDICAL HISTORY, SURGICAL HISTORY, FAMILY HISTORY, SOCIAL HISTORY AND REVIEW OF SYSTEMS  08/27/2015, which I have signed.  Family History  (Detailed)   SOCIAL HISTORY  (Detailed) Tobacco use reviewed. Preferred language is Unknown.   Smoking status: Never smoker.  SMOKING STATUS Use Status Type Smoking Status Usage Per Day Years Used Total Pack Years  no/never  Never smoker       HOME ENVIRONMENT/SAFETY  The Patient has fallen 1 times in the last year.  The fall(s) resulted in injury.  Details: Patient hurt his toe on the bed and back.       MEDICATIONS(added, continued or stopped this visit): Started Medication Directions Instruction Stopped   atenolol 25 mg tablet take 1 tablet by oral route  every day     duloxetine 30 mg capsule,delayed release take 1 capsule by oral route  every day     levalbuterol HFA 45 mcg/actuation aerosol inhaler inhale 1 - 2 puff by inhalation route  every 4 hours as needed     prednisone 10 mg tablet take 1 tablet by oral route  every day     Symbicort 160 mcg-4.5 mcg/actuation HFA aerosol inhaler inhale 2 puff by inhalation route 2 times every day in the morning and evening       ALLERGIES:   REVIEW OF SYSTEMS System Neg/Pos Details  Constitutional Negative Chills, fatigue, fever, malaise, night sweats, weight gain and weight loss.  ENMT Negative Ear drainage, hearing loss, nasal drainage,  otalgia, sinus pressure and sore throat.  Eyes Negative Eye discharge, eye pain and vision changes.  Respiratory Negative Chronic cough, cough, dyspnea, known TB exposure and wheezing.  Cardio Negative Chest pain, claudication, edema and irregular heartbeat/palpitations.  GI Negative Abdominal pain, blood in stool, change in stool pattern, constipation, decreased appetite, diarrhea, heartburn, nausea and vomiting.  GU Negative Dribbling, dysuria, erectile dysfunction, hematuria, polyuria, slow stream, urinary frequency, urinary incontinence  and urinary retention.  Endocrine Negative Cold intolerance, heat intolerance, polydipsia and polyphagia.  Neuro Positive Extremity weakness, Gait disturbance.  Psych Negative Anxiety, depression and insomnia.  Integumentary Negative Brittle hair, brittle nails, change in shape/size of mole(s), hair loss, hirsutism, hives, pruritus, rash and skin lesion.  MS Positive Back pain.  Hema/Lymph Negative Easy bleeding, easy bruising and lymphadenopathy.  Allergic/Immuno Negative Contact allergy, environmental allergies, food allergies and seasonal allergies.  Reproductive Negative Penile discharge and sexual dysfunction.     Vitals Date Temp F BP Pulse Ht In Wt Lb BMI BSA Pain Score  08/27/2015  155/90 80 74 280 35.95  3/10     PHYSICAL EXAM General Level of Distress: no acute distress Overall Appearance: normal  Head and Face  Right Left  Fundoscopic Exam:  normal normal    Cardiovascular Cardiac: regular rate and rhythm without murmur  Right Left  Carotid Pulses: normal normal  Respiratory Lungs: clear to auscultation  Neurological Orientation: normal Recent and Remote Memory: normal Attention Span and Concentration:   normal Language: normal Fund of Knowledge: normal  Right Left Sensation: normal normal Upper Extremity Coordination: normal normal  Lower Extremity Coordination: normal normal  Musculoskeletal Gait and Station: normal  Right Left Upper Extremity Muscle Strength: normal normal Lower Extremity Muscle Strength: normal normal Upper Extremity Muscle Tone:  normal normal Lower Extremity Muscle Tone: normal normal  Motor Strength Upper and lower extremity motor strength was tested in the clinically pertinent muscles. Any abnormal findings will be noted below.   Right Left Hip Flexor: 4-/5 4-/5 Tib Anterior: 0/5 4-/5 EHL: 4-/5 3/5   Deep Tendon  Reflexes  Right Left Biceps: normal normal Triceps: normal normal Brachiloradialis: normal normal Patellar: normal normal Achilles: normal normal  Sensory Sensation was tested at L1 to S1.   Cranial Nerves II. Optic Nerve/Visual Fields: normal III. Oculomotor: normal IV. Trochlear: normal V. Trigeminal: normal VI. Abducens: normal VII. Facial: normal VIII. Acoustic/Vestibular: normal IX. Glossopharyngeal: normal X. Vagus: normal XI. Spinal Accessory: normal XII. Hypoglossal: normal  Motor and other Tests Lhermittes: negative Rhomberg: negative Pronator drift: absent     Right Left Hoffman's: normal normal Clonus: normal normal Babinski: normal normal SLR: negative negative Patrick's Corky Sox): negative negative Toe Walk: normal normal Toe Lift: normal normal Heel Walk: normal normal SI Joint: nontender nontender   Additional Findings:  Complete foot drop on right. Able to stand with assistance.   DIAGNOSTIC RESULTS 08/19/15 MRI:  Severe central canal stenosis at L2-3 due to a disc bulge and ligamentum flavum thickening. Large cephald extending disc extrusion in the right paracentral position could impact the descending right L2 root. Moderate central canal stenosis, severe right and moderately severe left foraminal narrowing at L4-5 have worsened since the prior examination. S/p posterior decompression L4-5. L3-4 bone fusion.    IMPRESSION The patient is experiencing LE weakness and pain focal to the upper legs. On his MRI, L2-3 has severe central canal stenosis due to disc herniation. This is supported with pronounced bilateral LE weakness in his hip flexors and feet. I recommend redo  laminectomy L4-S1 with redo decompression with fusion and L2-3 laminectomy and discectomy on the right.  Completed Orders (this encounter) Order Details Reason Side Interpretation Result Initial Treatment Date Region  Hypertension education Follow up with primary care physician for  relief of elevated blood pressure.        Dietary management education, guidance, and counseling encouraged to eat a well balanced diet and follow up with primary care doctor.        Lumbar Spine- AP/Lat/Flex/Ex      08/27/2015    Assessment/Plan # Detail Type Description   1. Assessment Essential (primary) hypertension (I10).       2. Assessment Body mass index (BMI) 35.0-35.9, adult (Z68.35).   Plan Orders Today's instructions / counseling include(s) Dietary management education, guidance, and counseling.       3. Assessment Lumbar radiculopathy (M54.16).         Pain Assessment/Treatment Pain Scale: 3/10. Method: Numeric Pain Intensity Scale. Location: back. Onset: 02/27/2015. Duration: varies. Quality: discomforting. Pain Assessment/Treatment follow-up plan of care: Patient is taking medications prescribed.  Fall Risk Plan The Patient has fallen 1 times in the last year. The fall(s) resulted in injury. Details: Patient hurt his toe on the bed and back. Falls risk follow-up plan of care: Assisted devices: Advised to use cane/walker for safety.  Fitted for back brace. Nurse education given. Scheduled for surgery.  Orders: Diagnostic Procedures: Assessment Procedure  M54.16 Lumbar Spine- AP/Lat/Flex/Ex  Instruction(s)/Education: Assessment Instruction  I10 Hypertension education  [phone] Dietary management education, guidance, and counseling             Provider:  Marchia Meiers. Vertell Limber MD  08/27/2015 02:08 PM Dictation edited by: Johnella Moloney    CC Providers: [name] [address] [place], [place] [address]-              Electronically signed by Marchia Meiers. Vertell Limber MD on 08/27/2015 05:05 PM

## 2015-10-24 ENCOUNTER — Inpatient Hospital Stay (HOSPITAL_COMMUNITY): Payer: Medicare Other | Admitting: Anesthesiology

## 2015-10-24 ENCOUNTER — Inpatient Hospital Stay (HOSPITAL_COMMUNITY): Payer: Medicare Other

## 2015-10-24 ENCOUNTER — Encounter (HOSPITAL_COMMUNITY)
Admission: RE | Disposition: A | Discharge status: Rehabilitation Facility | Payer: Self-pay | Source: Ambulatory Visit | Attending: Neurosurgery

## 2015-10-24 ENCOUNTER — Encounter (HOSPITAL_COMMUNITY): Payer: Self-pay | Admitting: Surgery

## 2015-10-24 ENCOUNTER — Inpatient Hospital Stay (HOSPITAL_COMMUNITY)
Admission: RE | Admit: 2015-10-24 | Discharge: 2015-10-29 | DRG: 460 | Disposition: A | Discharge status: Rehabilitation Facility | Payer: Medicare Other | Source: Ambulatory Visit | Attending: Neurosurgery | Admitting: Neurosurgery

## 2015-10-24 DIAGNOSIS — M21371 Foot drop, right foot: Secondary | ICD-10-CM | POA: Diagnosis present

## 2015-10-24 DIAGNOSIS — Z96643 Presence of artificial hip joint, bilateral: Secondary | ICD-10-CM | POA: Diagnosis present

## 2015-10-24 DIAGNOSIS — I10 Essential (primary) hypertension: Secondary | ICD-10-CM | POA: Diagnosis not present

## 2015-10-24 DIAGNOSIS — F4323 Adjustment disorder with mixed anxiety and depressed mood: Secondary | ICD-10-CM | POA: Insufficient documentation

## 2015-10-24 DIAGNOSIS — Z96653 Presence of artificial knee joint, bilateral: Secondary | ICD-10-CM | POA: Diagnosis present

## 2015-10-24 DIAGNOSIS — G8929 Other chronic pain: Secondary | ICD-10-CM | POA: Diagnosis present

## 2015-10-24 DIAGNOSIS — R4189 Other symptoms and signs involving cognitive functions and awareness: Secondary | ICD-10-CM | POA: Diagnosis not present

## 2015-10-24 DIAGNOSIS — Z6835 Body mass index (BMI) 35.0-35.9, adult: Secondary | ICD-10-CM | POA: Diagnosis not present

## 2015-10-24 DIAGNOSIS — I4892 Unspecified atrial flutter: Secondary | ICD-10-CM | POA: Insufficient documentation

## 2015-10-24 DIAGNOSIS — Z79899 Other long term (current) drug therapy: Secondary | ICD-10-CM

## 2015-10-24 DIAGNOSIS — N3944 Nocturnal enuresis: Secondary | ICD-10-CM | POA: Diagnosis not present

## 2015-10-24 DIAGNOSIS — R531 Weakness: Secondary | ICD-10-CM | POA: Diagnosis present

## 2015-10-24 DIAGNOSIS — I5022 Chronic systolic (congestive) heart failure: Secondary | ICD-10-CM | POA: Diagnosis present

## 2015-10-24 DIAGNOSIS — D62 Acute posthemorrhagic anemia: Secondary | ICD-10-CM | POA: Diagnosis not present

## 2015-10-24 DIAGNOSIS — E785 Hyperlipidemia, unspecified: Secondary | ICD-10-CM | POA: Diagnosis not present

## 2015-10-24 DIAGNOSIS — S82891A Other fracture of right lower leg, initial encounter for closed fracture: Secondary | ICD-10-CM | POA: Insufficient documentation

## 2015-10-24 DIAGNOSIS — M4806 Spinal stenosis, lumbar region: Principal | ICD-10-CM | POA: Diagnosis present

## 2015-10-24 DIAGNOSIS — S82891D Other fracture of right lower leg, subsequent encounter for closed fracture with routine healing: Secondary | ICD-10-CM | POA: Diagnosis not present

## 2015-10-24 DIAGNOSIS — M5117 Intervertebral disc disorders with radiculopathy, lumbosacral region: Secondary | ICD-10-CM | POA: Diagnosis present

## 2015-10-24 DIAGNOSIS — Z85820 Personal history of malignant melanoma of skin: Secondary | ICD-10-CM

## 2015-10-24 DIAGNOSIS — G8918 Other acute postprocedural pain: Secondary | ICD-10-CM | POA: Insufficient documentation

## 2015-10-24 DIAGNOSIS — M19011 Primary osteoarthritis, right shoulder: Secondary | ICD-10-CM | POA: Diagnosis present

## 2015-10-24 DIAGNOSIS — Z9861 Coronary angioplasty status: Secondary | ICD-10-CM

## 2015-10-24 DIAGNOSIS — J45909 Unspecified asthma, uncomplicated: Secondary | ICD-10-CM | POA: Diagnosis present

## 2015-10-24 DIAGNOSIS — M1990 Unspecified osteoarthritis, unspecified site: Secondary | ICD-10-CM | POA: Diagnosis not present

## 2015-10-24 DIAGNOSIS — M4316 Spondylolisthesis, lumbar region: Secondary | ICD-10-CM | POA: Diagnosis present

## 2015-10-24 DIAGNOSIS — Z8249 Family history of ischemic heart disease and other diseases of the circulatory system: Secondary | ICD-10-CM | POA: Diagnosis not present

## 2015-10-24 DIAGNOSIS — Z96652 Presence of left artificial knee joint: Secondary | ICD-10-CM | POA: Diagnosis present

## 2015-10-24 DIAGNOSIS — Z888 Allergy status to other drugs, medicaments and biological substances status: Secondary | ICD-10-CM | POA: Diagnosis not present

## 2015-10-24 DIAGNOSIS — R410 Disorientation, unspecified: Secondary | ICD-10-CM | POA: Diagnosis not present

## 2015-10-24 DIAGNOSIS — M75100 Unspecified rotator cuff tear or rupture of unspecified shoulder, not specified as traumatic: Secondary | ICD-10-CM | POA: Insufficient documentation

## 2015-10-24 DIAGNOSIS — M549 Dorsalgia, unspecified: Secondary | ICD-10-CM

## 2015-10-24 DIAGNOSIS — D352 Benign neoplasm of pituitary gland: Secondary | ICD-10-CM | POA: Diagnosis present

## 2015-10-24 DIAGNOSIS — R5381 Other malaise: Secondary | ICD-10-CM | POA: Diagnosis not present

## 2015-10-24 DIAGNOSIS — F419 Anxiety disorder, unspecified: Secondary | ICD-10-CM | POA: Diagnosis present

## 2015-10-24 DIAGNOSIS — I2510 Atherosclerotic heart disease of native coronary artery without angina pectoris: Secondary | ICD-10-CM | POA: Insufficient documentation

## 2015-10-24 DIAGNOSIS — M79604 Pain in right leg: Secondary | ICD-10-CM | POA: Diagnosis present

## 2015-10-24 DIAGNOSIS — G4730 Sleep apnea, unspecified: Secondary | ICD-10-CM | POA: Diagnosis present

## 2015-10-24 DIAGNOSIS — R269 Unspecified abnormalities of gait and mobility: Secondary | ICD-10-CM | POA: Diagnosis not present

## 2015-10-24 DIAGNOSIS — Z419 Encounter for procedure for purposes other than remedying health state, unspecified: Secondary | ICD-10-CM | POA: Diagnosis not present

## 2015-10-24 DIAGNOSIS — M40209 Unspecified kyphosis, site unspecified: Secondary | ICD-10-CM | POA: Diagnosis present

## 2015-10-24 DIAGNOSIS — M4727 Other spondylosis with radiculopathy, lumbosacral region: Secondary | ICD-10-CM | POA: Insufficient documentation

## 2015-10-24 DIAGNOSIS — Z981 Arthrodesis status: Secondary | ICD-10-CM | POA: Diagnosis not present

## 2015-10-24 DIAGNOSIS — N135 Crossing vessel and stricture of ureter without hydronephrosis: Secondary | ICD-10-CM | POA: Diagnosis present

## 2015-10-24 DIAGNOSIS — M5116 Intervertebral disc disorders with radiculopathy, lumbar region: Secondary | ICD-10-CM | POA: Diagnosis not present

## 2015-10-24 DIAGNOSIS — M5126 Other intervertebral disc displacement, lumbar region: Secondary | ICD-10-CM | POA: Diagnosis not present

## 2015-10-24 DIAGNOSIS — M48062 Spinal stenosis, lumbar region with neurogenic claudication: Secondary | ICD-10-CM | POA: Diagnosis present

## 2015-10-24 DIAGNOSIS — J45991 Cough variant asthma: Secondary | ICD-10-CM | POA: Insufficient documentation

## 2015-10-24 DIAGNOSIS — Z96642 Presence of left artificial hip joint: Secondary | ICD-10-CM | POA: Diagnosis present

## 2015-10-24 DIAGNOSIS — I110 Hypertensive heart disease with heart failure: Secondary | ICD-10-CM | POA: Diagnosis present

## 2015-10-24 DIAGNOSIS — K5900 Constipation, unspecified: Secondary | ICD-10-CM | POA: Diagnosis present

## 2015-10-24 DIAGNOSIS — R413 Other amnesia: Secondary | ICD-10-CM | POA: Insufficient documentation

## 2015-10-24 DIAGNOSIS — M4726 Other spondylosis with radiculopathy, lumbar region: Secondary | ICD-10-CM | POA: Diagnosis not present

## 2015-10-24 DIAGNOSIS — E871 Hypo-osmolality and hyponatremia: Secondary | ICD-10-CM | POA: Diagnosis not present

## 2015-10-24 DIAGNOSIS — M5416 Radiculopathy, lumbar region: Secondary | ICD-10-CM | POA: Diagnosis present

## 2015-10-24 DIAGNOSIS — F329 Major depressive disorder, single episode, unspecified: Secondary | ICD-10-CM | POA: Diagnosis present

## 2015-10-24 DIAGNOSIS — N359 Urethral stricture, unspecified: Secondary | ICD-10-CM | POA: Diagnosis not present

## 2015-10-24 DIAGNOSIS — K219 Gastro-esophageal reflux disease without esophagitis: Secondary | ICD-10-CM | POA: Diagnosis present

## 2015-10-24 DIAGNOSIS — Z6833 Body mass index (BMI) 33.0-33.9, adult: Secondary | ICD-10-CM | POA: Diagnosis not present

## 2015-10-24 DIAGNOSIS — R451 Restlessness and agitation: Secondary | ICD-10-CM | POA: Diagnosis not present

## 2015-10-24 DIAGNOSIS — Z8739 Personal history of other diseases of the musculoskeletal system and connective tissue: Secondary | ICD-10-CM | POA: Diagnosis not present

## 2015-10-24 DIAGNOSIS — F068 Other specified mental disorders due to known physiological condition: Secondary | ICD-10-CM | POA: Diagnosis not present

## 2015-10-24 DIAGNOSIS — G479 Sleep disorder, unspecified: Secondary | ICD-10-CM | POA: Diagnosis not present

## 2015-10-24 DIAGNOSIS — D72829 Elevated white blood cell count, unspecified: Secondary | ICD-10-CM | POA: Diagnosis not present

## 2015-10-24 SURGERY — POSTERIOR LUMBAR FUSION 2 LEVEL
Anesthesia: General | Site: Back

## 2015-10-24 MED ORDER — FENTANYL CITRATE (PF) 250 MCG/5ML IJ SOLN
INTRAMUSCULAR | Status: AC
  Filled 2015-10-24: qty 5

## 2015-10-24 MED ORDER — ONDANSETRON HCL 4 MG/2ML IJ SOLN: 4.0000 mg | INTRAMUSCULAR | Status: DC | PRN

## 2015-10-24 MED ORDER — PHENOL 1.4 % MT LIQD
1.0000 | OROMUCOSAL | Status: DC | PRN
Start: 2015-10-24 — End: 2015-10-29

## 2015-10-24 MED ORDER — MEPERIDINE HCL 25 MG/ML IJ SOLN: 6.2500 mg | INTRAMUSCULAR | Status: DC | PRN

## 2015-10-24 MED ORDER — HYDROMORPHONE HCL 1 MG/ML IJ SOLN
0.5000 mg | INTRAMUSCULAR | Status: DC | PRN
  Administered 2015-10-24 – 2015-10-26 (×5): 1 mg via INTRAVENOUS
  Filled 2015-10-24 (×5): qty 1

## 2015-10-24 MED ORDER — PHENYLEPHRINE HCL 10 MG/ML IJ SOLN
10.0000 mg | INTRAVENOUS | Status: DC | PRN
  Administered 2015-10-24: 50 ug/min via INTRAVENOUS

## 2015-10-24 MED ORDER — PROPOFOL 1000 MG/100ML IV EMUL
5.0000 ug/kg/min | INTRAVENOUS | Status: DC
  Administered 2015-10-24: 50 ug/kg/min via INTRAVENOUS
  Filled 2015-10-24 (×3): qty 100

## 2015-10-24 MED ORDER — PROPOFOL 10 MG/ML IV BOLUS
INTRAVENOUS | Status: DC | PRN
  Administered 2015-10-24: 15 mg via INTRAVENOUS

## 2015-10-24 MED ORDER — TRAMADOL HCL 50 MG PO TABS: 50.0000 mg | ORAL_TABLET | Freq: Three times a day (TID) | ORAL | Status: DC | PRN

## 2015-10-24 MED ORDER — MELOXICAM 7.5 MG PO TABS: 15.0000 mg | ORAL_TABLET | Freq: Every day | ORAL | Status: DC

## 2015-10-24 MED ORDER — HYDROCODONE-ACETAMINOPHEN 5-325 MG PO TABS: 0.5000 | ORAL_TABLET | Freq: Three times a day (TID) | ORAL | Status: DC | PRN

## 2015-10-24 MED ORDER — FUROSEMIDE 20 MG PO TABS
20.0000 mg | ORAL_TABLET | ORAL | Status: DC
  Administered 2015-10-25 – 2015-10-29 (×3): 20 mg via ORAL
  Filled 2015-10-24 (×3): qty 1

## 2015-10-24 MED ORDER — ZOLPIDEM TARTRATE 5 MG PO TABS: 5.0000 mg | ORAL_TABLET | Freq: Every evening | ORAL | Status: DC | PRN

## 2015-10-24 MED ORDER — FLEET ENEMA 7-19 GM/118ML RE ENEM
1.0000 | ENEMA | Freq: Once | RECTAL | Status: AC | PRN
  Administered 2015-10-29: 1 via RECTAL
  Filled 2015-10-24: qty 1

## 2015-10-24 MED ORDER — POLYVINYL ALCOHOL 1.4 % OP SOLN: 1.0000 [drp] | Freq: Three times a day (TID) | OPHTHALMIC | Status: DC | PRN

## 2015-10-24 MED ORDER — MENTHOL 3 MG MT LOZG: 1.0000 | LOZENGE | OROMUCOSAL | Status: DC | PRN

## 2015-10-24 MED ORDER — AMLODIPINE BESYLATE 5 MG PO TABS
5.0000 mg | ORAL_TABLET | Freq: Every day | ORAL | Status: DC
  Administered 2015-10-25 – 2015-10-29 (×5): 5 mg via ORAL
  Filled 2015-10-24 (×5): qty 1

## 2015-10-24 MED ORDER — SODIUM CHLORIDE 0.9% FLUSH
3.0000 mL | Freq: Two times a day (BID) | INTRAVENOUS | Status: DC
  Administered 2015-10-25 – 2015-10-29 (×8): 3 mL via INTRAVENOUS

## 2015-10-24 MED ORDER — SODIUM CHLORIDE 0.9 % IV SOLN
0.0125 ug/kg/min | INTRAVENOUS | Status: DC
  Administered 2015-10-24: .1 ug/kg/min via INTRAVENOUS
  Filled 2015-10-24: qty 2000

## 2015-10-24 MED ORDER — ACETAMINOPHEN 650 MG RE SUPP: 650.0000 mg | RECTAL | Status: DC | PRN

## 2015-10-24 MED ORDER — FENTANYL CITRATE (PF) 100 MCG/2ML IJ SOLN
INTRAMUSCULAR | Status: AC
  Administered 2015-10-24: 25 ug via INTRAVENOUS
  Filled 2015-10-24: qty 2

## 2015-10-24 MED ORDER — METHOCARBAMOL 500 MG PO TABS
500.0000 mg | ORAL_TABLET | Freq: Four times a day (QID) | ORAL | Status: DC | PRN
  Administered 2015-10-25 – 2015-10-29 (×4): 500 mg via ORAL
  Filled 2015-10-24 (×4): qty 1

## 2015-10-24 MED ORDER — SODIUM CHLORIDE 0.9% FLUSH: 3.0000 mL | INTRAVENOUS | Status: DC | PRN

## 2015-10-24 MED ORDER — EZETIMIBE 10 MG PO TABS
10.0000 mg | ORAL_TABLET | Freq: Every day | ORAL | Status: DC
  Administered 2015-10-25 – 2015-10-29 (×5): 10 mg via ORAL
  Filled 2015-10-24 (×5): qty 1

## 2015-10-24 MED ORDER — POLYETHYLENE GLYCOL 3350 17 G PO PACK
17.0000 g | PACK | Freq: Every day | ORAL | Status: DC
  Administered 2015-10-25 – 2015-10-28 (×4): 17 g via ORAL
  Filled 2015-10-24 (×4): qty 1

## 2015-10-24 MED ORDER — LIDOCAINE-EPINEPHRINE 1 %-1:100000 IJ SOLN
INTRAMUSCULAR | Status: DC | PRN
  Administered 2015-10-24: 10 mL

## 2015-10-24 MED ORDER — PROPOFOL 10 MG/ML IV BOLUS
INTRAVENOUS | Status: AC
  Filled 2015-10-24: qty 20

## 2015-10-24 MED ORDER — METOCLOPRAMIDE HCL 5 MG/ML IJ SOLN: 10.0000 mg | Freq: Once | INTRAMUSCULAR | Status: DC | PRN

## 2015-10-24 MED ORDER — PANTOPRAZOLE SODIUM 40 MG PO TBEC
40.0000 mg | DELAYED_RELEASE_TABLET | Freq: Every day | ORAL | Status: DC
  Administered 2015-10-25 – 2015-10-29 (×5): 40 mg via ORAL
  Filled 2015-10-24 (×5): qty 1

## 2015-10-24 MED ORDER — ACETAMINOPHEN 500 MG PO TABS: 1000.0000 mg | ORAL_TABLET | Freq: Three times a day (TID) | ORAL | Status: DC | PRN

## 2015-10-24 MED ORDER — KCL IN DEXTROSE-NACL 20-5-0.45 MEQ/L-%-% IV SOLN
INTRAVENOUS | Status: DC
  Administered 2015-10-24: 23:00:00 via INTRAVENOUS
  Filled 2015-10-24 (×8): qty 1000

## 2015-10-24 MED ORDER — BISACODYL 10 MG RE SUPP
10.0000 mg | Freq: Every day | RECTAL | Status: DC | PRN
  Filled 2015-10-24: qty 1

## 2015-10-24 MED ORDER — OXYCODONE-ACETAMINOPHEN 5-325 MG PO TABS
1.0000 | ORAL_TABLET | ORAL | Status: DC | PRN
  Administered 2015-10-26 (×3): 1 via ORAL
  Administered 2015-10-27: 2 via ORAL
  Administered 2015-10-27: 1 via ORAL
  Administered 2015-10-28: 2 via ORAL
  Filled 2015-10-24 (×3): qty 1
  Filled 2015-10-24: qty 2
  Filled 2015-10-24: qty 1
  Filled 2015-10-24: qty 2

## 2015-10-24 MED ORDER — 0.9 % SODIUM CHLORIDE (POUR BTL) OPTIME
TOPICAL | Status: DC | PRN
  Administered 2015-10-24: 1000 mL

## 2015-10-24 MED ORDER — LIDOCAINE 2% (20 MG/ML) 5 ML SYRINGE
INTRAMUSCULAR | Status: AC
  Filled 2015-10-24: qty 5

## 2015-10-24 MED ORDER — FENTANYL CITRATE (PF) 100 MCG/2ML IJ SOLN
25.0000 ug | INTRAMUSCULAR | Status: DC | PRN
  Administered 2015-10-24: 25 ug via INTRAVENOUS
  Administered 2015-10-24: 50 ug via INTRAVENOUS
  Administered 2015-10-24: 25 ug via INTRAVENOUS

## 2015-10-24 MED ORDER — SODIUM CHLORIDE 0.9 % IV SOLN
INTRAVENOUS | Status: DC | PRN
  Administered 2015-10-24: 18:00:00 via INTRAVENOUS

## 2015-10-24 MED ORDER — FAMOTIDINE IN NACL 20-0.9 MG/50ML-% IV SOLN
20.0000 mg | Freq: Two times a day (BID) | INTRAVENOUS | Status: DC
  Administered 2015-10-25 (×2): 20 mg via INTRAVENOUS
  Filled 2015-10-24 (×2): qty 50

## 2015-10-24 MED ORDER — CEFAZOLIN SODIUM-DEXTROSE 2-4 GM/100ML-% IV SOLN
2.0000 g | Freq: Three times a day (TID) | INTRAVENOUS | Status: AC
  Administered 2015-10-25 (×2): 2 g via INTRAVENOUS
  Filled 2015-10-24 (×2): qty 100

## 2015-10-24 MED ORDER — BUPIVACAINE LIPOSOME 1.3 % IJ SUSP
20.0000 mL | Freq: Once | INTRAMUSCULAR | Status: DC
  Filled 2015-10-24: qty 20

## 2015-10-24 MED ORDER — HYDROCODONE-ACETAMINOPHEN 5-325 MG PO TABS
1.0000 | ORAL_TABLET | ORAL | Status: DC | PRN
  Administered 2015-10-25: 2 via ORAL
  Administered 2015-10-25 – 2015-10-29 (×2): 1 via ORAL
  Filled 2015-10-24 (×2): qty 1
  Filled 2015-10-24: qty 2

## 2015-10-24 MED ORDER — ATENOLOL 25 MG PO TABS
25.0000 mg | ORAL_TABLET | Freq: Every day | ORAL | Status: DC
  Administered 2015-10-25 – 2015-10-29 (×5): 25 mg via ORAL
  Filled 2015-10-24 (×5): qty 1

## 2015-10-24 MED ORDER — PHENYLEPHRINE HCL 10 MG/ML IJ SOLN
INTRAMUSCULAR | Status: DC | PRN
  Administered 2015-10-24: 80 ug via INTRAVENOUS
  Administered 2015-10-24: 120 ug via INTRAVENOUS
  Administered 2015-10-24: 80 ug via INTRAVENOUS

## 2015-10-24 MED ORDER — BUPIVACAINE HCL (PF) 0.5 % IJ SOLN
INTRAMUSCULAR | Status: DC | PRN
  Administered 2015-10-24: 10 mL

## 2015-10-24 MED ORDER — HYDROCORTISONE 1 % EX CREA
1.0000 "application " | TOPICAL_CREAM | Freq: Two times a day (BID) | CUTANEOUS | Status: DC
  Administered 2015-10-25 – 2015-10-29 (×6): 1 via TOPICAL
  Filled 2015-10-24: qty 28

## 2015-10-24 MED ORDER — CALCIUM CARBONATE 1250 (500 CA) MG PO TABS
1250.0000 mg | ORAL_TABLET | Freq: Two times a day (BID) | ORAL | Status: DC
  Administered 2015-10-25 – 2015-10-29 (×10): 1250 mg via ORAL
  Filled 2015-10-24 (×10): qty 1

## 2015-10-24 MED ORDER — SODIUM CHLORIDE 0.9 % IV SOLN
250.0000 mL | INTRAVENOUS | Status: DC
Start: 2015-10-24 — End: 2015-10-29

## 2015-10-24 MED ORDER — ONDANSETRON HCL 4 MG/2ML IJ SOLN
INTRAMUSCULAR | Status: DC | PRN
  Administered 2015-10-24: 4 mg via INTRAVENOUS

## 2015-10-24 MED ORDER — LIDOCAINE 2% (20 MG/ML) 5 ML SYRINGE
INTRAMUSCULAR | Status: DC | PRN
  Administered 2015-10-24: 50 mg via INTRAVENOUS

## 2015-10-24 MED ORDER — SUCCINYLCHOLINE CHLORIDE 200 MG/10ML IV SOSY
PREFILLED_SYRINGE | INTRAVENOUS | Status: DC | PRN
  Administered 2015-10-24: 140 mg via INTRAVENOUS

## 2015-10-24 MED ORDER — ALBUMIN HUMAN 5 % IV SOLN
INTRAVENOUS | Status: DC | PRN
  Administered 2015-10-24 (×2): via INTRAVENOUS

## 2015-10-24 MED ORDER — SURGIFOAM 100 EX MISC
CUTANEOUS | Status: DC | PRN
  Administered 2015-10-24: 18:00:00 via TOPICAL

## 2015-10-24 MED ORDER — ARTIFICIAL TEARS OP OINT: TOPICAL_OINTMENT | OPHTHALMIC | Status: DC | PRN

## 2015-10-24 MED ORDER — MOMETASONE FURO-FORMOTEROL FUM 200-5 MCG/ACT IN AERO
2.0000 | INHALATION_SPRAY | Freq: Two times a day (BID) | RESPIRATORY_TRACT | Status: DC
  Administered 2015-10-25 – 2015-10-29 (×7): 2 via RESPIRATORY_TRACT
  Filled 2015-10-24 (×2): qty 8.8

## 2015-10-24 MED ORDER — SENNOSIDES-DOCUSATE SODIUM 8.6-50 MG PO TABS
1.0000 | ORAL_TABLET | Freq: Every evening | ORAL | Status: DC | PRN
  Administered 2015-10-28: 1 via ORAL
  Filled 2015-10-24 (×2): qty 1

## 2015-10-24 MED ORDER — ACETAMINOPHEN 325 MG PO TABS
650.0000 mg | ORAL_TABLET | ORAL | Status: DC | PRN
  Administered 2015-10-28 – 2015-10-29 (×3): 650 mg via ORAL
  Filled 2015-10-24 (×3): qty 2

## 2015-10-24 MED ORDER — DOCUSATE SODIUM 100 MG PO CAPS
100.0000 mg | ORAL_CAPSULE | Freq: Two times a day (BID) | ORAL | Status: DC
  Administered 2015-10-25 – 2015-10-29 (×7): 100 mg via ORAL
  Filled 2015-10-24 (×7): qty 1

## 2015-10-24 MED ORDER — SODIUM CHLORIDE 0.9 % IV SOLN
INTRAVENOUS | Status: DC | PRN
  Administered 2015-10-24: 40 mL

## 2015-10-24 MED ORDER — LACTATED RINGERS IV SOLN
INTRAVENOUS | Status: DC
  Administered 2015-10-24 (×4): via INTRAVENOUS

## 2015-10-24 MED ORDER — DULOXETINE HCL 30 MG PO CPEP
30.0000 mg | ORAL_CAPSULE | Freq: Every day | ORAL | Status: DC
  Administered 2015-10-25 – 2015-10-29 (×5): 30 mg via ORAL
  Filled 2015-10-24 (×5): qty 1

## 2015-10-24 MED ORDER — SUCCINYLCHOLINE CHLORIDE 200 MG/10ML IV SOSY
PREFILLED_SYRINGE | INTRAVENOUS | Status: AC
Start: 2015-10-24 — End: 2015-10-24
  Filled 2015-10-24: qty 10

## 2015-10-24 MED ORDER — ALUM & MAG HYDROXIDE-SIMETH 200-200-20 MG/5ML PO SUSP: 30.0000 mL | Freq: Four times a day (QID) | ORAL | Status: DC | PRN

## 2015-10-24 MED ORDER — FENTANYL CITRATE (PF) 250 MCG/5ML IJ SOLN
INTRAMUSCULAR | Status: DC | PRN
  Administered 2015-10-24: 250 ug via INTRAVENOUS

## 2015-10-24 MED ORDER — METHOCARBAMOL 1000 MG/10ML IJ SOLN
500.0000 mg | Freq: Four times a day (QID) | INTRAVENOUS | Status: DC | PRN
  Filled 2015-10-24: qty 5

## 2015-10-24 SURGICAL SUPPLY — 90 items
ADH SKN CLS APL DERMABOND .7 (GAUZE/BANDAGES/DRESSINGS) ×1
BLADE CLIPPER SURG (BLADE) ×1 IMPLANT
BONE CANC CHIPS 40CC CAN1/2 (Bone Implant) ×4 IMPLANT
BUR MATCHSTICK NEURO 3.0 LAGG (BURR) ×2 IMPLANT
BUR PRECISION FLUTE 5.0 (BURR) ×2 IMPLANT
CAGE COROENT LG 12X9X23-12 (Cage) ×2 IMPLANT
CAGE PLIF 8X9X23-12 LUMBAR (Cage) ×2 IMPLANT
CANISTER SUCT 3000ML PPV (MISCELLANEOUS) ×2 IMPLANT
CATH COUDE FOLEY 2W 5CC 16FR (CATHETERS) ×1 IMPLANT
CHIPS CANC BONE 40CC CAN1/2 (Bone Implant) ×2 IMPLANT
CLIP NEUROVISION LG (CLIP) ×1 IMPLANT
CONT SPEC 4OZ CLIKSEAL STRL BL (MISCELLANEOUS) ×2 IMPLANT
COVER BACK TABLE 60X90IN (DRAPES) ×2 IMPLANT
DECANTER SPIKE VIAL GLASS SM (MISCELLANEOUS) ×1 IMPLANT
DERMABOND ADVANCED (GAUZE/BANDAGES/DRESSINGS) ×1
DERMABOND ADVANCED .7 DNX12 (GAUZE/BANDAGES/DRESSINGS) ×1 IMPLANT
DRAPE C-ARM 42X72 X-RAY (DRAPES) ×4 IMPLANT
DRAPE C-ARMOR (DRAPES) ×1 IMPLANT
DRAPE LAPAROTOMY 100X72X124 (DRAPES) ×2 IMPLANT
DRAPE POUCH INSTRU U-SHP 10X18 (DRAPES) ×2 IMPLANT
DRAPE SURG 17X23 STRL (DRAPES) ×2 IMPLANT
DRSG OPSITE POSTOP 4X10 (GAUZE/BANDAGES/DRESSINGS) ×1 IMPLANT
DURAPREP 26ML APPLICATOR (WOUND CARE) ×2 IMPLANT
ELECT BLADE 4.0 EZ CLEAN MEGAD (MISCELLANEOUS) ×2
ELECT REM PT RETURN 9FT ADLT (ELECTROSURGICAL) ×2
ELECTRODE BLDE 4.0 EZ CLN MEGD (MISCELLANEOUS) IMPLANT
ELECTRODE REM PT RTRN 9FT ADLT (ELECTROSURGICAL) ×1 IMPLANT
EVACUATOR 1/8 PVC DRAIN (DRAIN) IMPLANT
GAUZE SPONGE 4X4 12PLY STRL (GAUZE/BANDAGES/DRESSINGS) ×1 IMPLANT
GAUZE SPONGE 4X4 16PLY XRAY LF (GAUZE/BANDAGES/DRESSINGS) ×1 IMPLANT
GLOVE BIO SURGEON STRL SZ 6.5 (GLOVE) ×3 IMPLANT
GLOVE BIO SURGEON STRL SZ7 (GLOVE) ×5 IMPLANT
GLOVE BIO SURGEON STRL SZ7.5 (GLOVE) ×2 IMPLANT
GLOVE BIO SURGEON STRL SZ8 (GLOVE) ×7 IMPLANT
GLOVE BIOGEL PI IND STRL 7.0 (GLOVE) IMPLANT
GLOVE BIOGEL PI IND STRL 7.5 (GLOVE) IMPLANT
GLOVE BIOGEL PI IND STRL 8 (GLOVE) ×2 IMPLANT
GLOVE BIOGEL PI IND STRL 8.5 (GLOVE) ×2 IMPLANT
GLOVE BIOGEL PI INDICATOR 7.0 (GLOVE) ×2
GLOVE BIOGEL PI INDICATOR 7.5 (GLOVE) ×3
GLOVE BIOGEL PI INDICATOR 8 (GLOVE) ×2
GLOVE BIOGEL PI INDICATOR 8.5 (GLOVE) ×2
GLOVE ECLIPSE 8.0 STRL XLNG CF (GLOVE) ×4 IMPLANT
GOWN STRL REUS W/ TWL LRG LVL3 (GOWN DISPOSABLE) IMPLANT
GOWN STRL REUS W/ TWL XL LVL3 (GOWN DISPOSABLE) ×3 IMPLANT
GOWN STRL REUS W/TWL 2XL LVL3 (GOWN DISPOSABLE) ×3 IMPLANT
GOWN STRL REUS W/TWL LRG LVL3 (GOWN DISPOSABLE) ×6
GOWN STRL REUS W/TWL XL LVL3 (GOWN DISPOSABLE) ×8
GRAFT BNE CHIP CANC 1-8 40 (Bone Implant) IMPLANT
KIT BASIN OR (CUSTOM PROCEDURE TRAY) ×2 IMPLANT
KIT INFUSE MEDIUM (Orthopedic Implant) ×1 IMPLANT
KIT POSITION SURG JACKSON T1 (MISCELLANEOUS) ×2 IMPLANT
KIT ROOM TURNOVER OR (KITS) ×2 IMPLANT
MILL MEDIUM DISP (BLADE) ×2 IMPLANT
MODULE NVM5 NEXT GEN EMG (NEEDLE) ×1 IMPLANT
NDL HYPO 21X1.5 SAFETY (NEEDLE) IMPLANT
NDL HYPO 25X1 1.5 SAFETY (NEEDLE) ×1 IMPLANT
NDL SPNL 18GX3.5 QUINCKE PK (NEEDLE) IMPLANT
NEEDLE HYPO 21X1.5 SAFETY (NEEDLE) ×2 IMPLANT
NEEDLE HYPO 25X1 1.5 SAFETY (NEEDLE) ×2 IMPLANT
NEEDLE SPNL 18GX3.5 QUINCKE PK (NEEDLE) IMPLANT
NS IRRIG 1000ML POUR BTL (IV SOLUTION) ×2 IMPLANT
PACK FOAM VITOSS 10CC (Orthopedic Implant) ×2 IMPLANT
PACK LAMINECTOMY NEURO (CUSTOM PROCEDURE TRAY) ×2 IMPLANT
PAD ARMBOARD 7.5X6 YLW CONV (MISCELLANEOUS) ×6 IMPLANT
PATTIES SURGICAL .5 X.5 (GAUZE/BANDAGES/DRESSINGS) IMPLANT
PATTIES SURGICAL .5 X1 (DISPOSABLE) IMPLANT
PATTIES SURGICAL 1X1 (DISPOSABLE) IMPLANT
ROD PRECEPT TI 130MM LORD PB (Rod) ×2 IMPLANT
SCREW LOCK RELINE 5.5 TULIP (Screw) ×8 IMPLANT
SCREW RELINE-O POLY 6.5X50MM (Screw) ×6 IMPLANT
SCREW RELINE-O POLY 7.5X45 (Screw) ×1 IMPLANT
SCREW RELINE-O POLY 7.5X50 (Screw) ×2 IMPLANT
SCREW RLINE PLY 2S 50X7.5XPA (Screw) IMPLANT
SPONGE LAP 4X18 X RAY DECT (DISPOSABLE) ×1 IMPLANT
SPONGE SURGIFOAM ABS GEL 100 (HEMOSTASIS) ×2 IMPLANT
STAPLER SKIN PROX WIDE 3.9 (STAPLE) IMPLANT
STRIP CLOSURE SKIN 1/2X4 (GAUZE/BANDAGES/DRESSINGS) ×1 IMPLANT
SUT VIC AB 1 CT1 18XBRD ANBCTR (SUTURE) ×2 IMPLANT
SUT VIC AB 1 CT1 8-18 (SUTURE) ×4
SUT VIC AB 2-0 CT1 18 (SUTURE) ×4 IMPLANT
SUT VIC AB 3-0 SH 8-18 (SUTURE) ×5 IMPLANT
SYR 30ML LL (SYRINGE) ×1 IMPLANT
SYR 3ML LL SCALE MARK (SYRINGE) ×8 IMPLANT
SYR 5ML LL (SYRINGE) IMPLANT
TOWEL OR 17X24 6PK STRL BLUE (TOWEL DISPOSABLE) ×2 IMPLANT
TOWEL OR 17X26 10 PK STRL BLUE (TOWEL DISPOSABLE) ×2 IMPLANT
TRAP SPECIMEN MUCOUS 40CC (MISCELLANEOUS) ×2 IMPLANT
TRAY FOLEY W/METER SILVER 16FR (SET/KITS/TRAYS/PACK) ×1 IMPLANT
WATER STERILE IRR 1000ML POUR (IV SOLUTION) ×2 IMPLANT

## 2015-10-24 NOTE — Op Note (Signed)
10/24/2015  9:07 PM  PATIENT:  Mitchell [name]  81 y.o. male  PRE-OPERATIVE DIAGNOSIS:  Lumbar stenosis and Herniated Nucleus Pulposus with radiculopathy, kyphosis, spondylosis L 2 - S 1 levels  POST-OPERATIVE DIAGNOSIS:  Lumbar stenosis and Herniated Nucleus Pulposus with radiculopathy, kyphosis, spondylosis L 2 - S 1 levels  PROCEDURE:  Procedure(s) with comments: L4-5 L5-S1 Redo Decompression/Fusion/L2-3 Laminectomy with Right Microdiskectomy, L2-S1 Instrumentation (N/A) - L4-5 L5-S1 Redo Decompression/Fusion/L2-3 Laminectomy with Right Microdiskectomy, L2-S1 Instrumentation with posterolateral arthrodesis  SURGEON:  Surgeon(s) and Role:    * Erline Levine, MD - Primary    * Eustace Moore, MD - Assisting  PHYSICIAN ASSISTANT:   ASSISTANTS: [place], RN   ANESTHESIA:   general  EBL:  Total I/O In: 1100 [I.V.:1100] Out: 450 [Urine:300; Blood:150]  BLOOD ADMINISTERED:200 CC CELLSAVER  DRAINS: (Medium) Hemovact drain(s) in the epidural space with  Suction Open   LOCAL MEDICATIONS USED:  MARCAINE    and LIDOCAINE   SPECIMEN:  No Specimen  DISPOSITION OF SPECIMEN:  N/A  COUNTS:  YES  TOURNIQUET:  * No tourniquets in log *  DICTATION: Patient is 81 year old man with  HNP, spondylosis, kyphosis, recurrent stenosis, DDD, radiculopathy L4/5, L5/S1 with autofusion L 34 and large disc herniation L 23 level. He has a severe right greater than left leg pain and weakness. It was elected to take him to surgery for redo  decompression with fusion at L4/5 and L5/S1 levels with laminectomy and microdiscectomy L 23 with pedicle screw fixation L 2- S 1 levels with posterolateral arthrodesis L 2 - S 1 levels.    Procedure: Patient was placed in a prone position on the [place] table after smooth and uncomplicated induction of general endotracheal anesthesia. His low back was prepped and draped in usual sterile fashion with betadine scrub and DuraPrep. Area of incision was infiltrated with local  lidocaine. Incision was made to the lumbodorsal fascia was incised and exposure was performed of the L2 - S 1levels.  He had previously undergone total laminectomy L 3 - S 1 levels.  Exposure was carried to laminae, facet joints and transverse processes from L 2 - S 1 levels.  Intraoperative x-ray was obtained which confirmed correct orientation. A total laminectomy of L2 through L 3 was performed with disarticulation of the facet joints at this level and thorough decompression was performed of both L2, L3  nerve roots along with the common dural tube. The microscope was brought into the field and right microdiscectomy was performed with removal of multiple subligamentous disc fragments.  This resulted in significant decompression of the thecal sac and nerve roots.  Attention was then turned to the L 45 and L 5 S 1 levels.  There was densely adherent spondylytic material compressing the thecal sac and both L4 and L5 and S 1 nerve roots.  Decompression was greater than would be typically performed for simple interbody fusion. Complete facetectomy of L 5 and L 4 was performed bilaterally and painstaking removal of bone was performed on each side at both levels without violation of the dura.  A thorough discectomy and preparation of the endplates was performed at both the L4/5 and L5/S1 levels.  The interspaces were packed with  and PEEK cages (12 x 23 x 12 degree at L5/S1 and 8 x 23 x 12 degree at L4/5) . Bone autograft was packed within the interspace bilaterally. 30 cc of autograft was placed in the L5/S1 interspace.  After a thorough decompression with bilateral  discectomy at L4/5, 25 cc of autograft was packed in the interspace medial to the first cage. The posterolateral region was extensively decorticated and pedicle probes were placed at L2, L4, L 5 and S1 bilaterally. Intraoperative fluoroscopy confirmed correct orientationin the AP and lateral plane. 35 x 6.5 mm pedicle screws were placed at S1 bilaterally  and 50 x 6.5 mm screws placed at L2, L 4, L 5 bilaterally and 45x 7.97mm screws were placed at S 1 bilaterally.  Final x-rays demonstrated well-positioned interbody grafts and pedicle screw fixation. 130 mm lordotic rods were placed and locked down in situ and the posterolateral region was packed with the remaining medium BMP, 30 cc bone graft  on the right and 10 cc bone graft with 20 cc Vitoss bone graft extender on the left. A medium Hemovac drain was placed and anchored with a stitch.  Long-acting Marcaine was injected in the deep musculature.  Fascia was closed with 1 Vicryl sutures skin edges were reapproximated 2 and 3-0 Vicryl sutures. The wound is dressed with Dermabond and an occlusive dressing. The patient was extubated in the operating room and taken to recovery in stable satisfactory condition having tolerated the operation well. Counts were correct at the end of the case.    PLAN OF CARE: Admit to inpatient   PATIENT DISPOSITION:  PACU - hemodynamically stable.   Delay start of Pharmacological VTE agent (>24hrs) due to surgical blood loss or risk of bleeding: yes

## 2015-10-24 NOTE — Anesthesia Preprocedure Evaluation (Addendum)
Anesthesia Evaluation  Patient identified by MRN, date of birth, ID band Patient awake    Reviewed: Allergy & Precautions, NPO status , Patient's Chart, lab work & pertinent test results  Airway Mallampati: II  TM Distance: >3 FB Neck ROM: Full    Dental no notable dental hx. (+) Poor Dentition   Pulmonary shortness of breath and with exertion, asthma , sleep apnea ,    Pulmonary exam normal breath sounds clear to auscultation       Cardiovascular hypertension, Pt. on medications + CAD and +CHF  Normal cardiovascular exam+ dysrhythmias Atrial Fibrillation  Rhythm:Regular Rate:Normal     Neuro/Psych PSYCHIATRIC DISORDERS Anxiety Depression negative neurological ROS  negative psych ROS   GI/Hepatic negative GI ROS, Neg liver ROS, GERD  Medicated and Controlled,  Endo/Other  negative endocrine ROS  Renal/GU negative Renal ROS  negative genitourinary   Musculoskeletal negative musculoskeletal ROS (+) Arthritis ,   Abdominal   Peds negative pediatric ROS (+)  Hematology negative hematology ROS (+)   Anesthesia Other Findings   Reproductive/Obstetrics negative OB ROS                          EKG 12/13/14:  NSR w/ 1 degree HB   Lab Results  Component Value Date   WBC 8.1 10/16/2015   HGB 14.3 10/16/2015   HCT 44.3 10/16/2015   MCV 98.0 10/16/2015   PLT 260 10/16/2015     Chemistry      Component Value Date/Time   NA 136 10/16/2015 1311   K 4.3 10/16/2015 1311   CL 102 10/16/2015 1311   CO2 28 10/16/2015 1311   BUN 11 10/16/2015 1311   CREATININE 0.60* 10/16/2015 1311   CREATININE 0.68* 05/27/2015 0931      Component Value Date/Time   CALCIUM 8.7* 10/16/2015 1311   ALKPHOS 88 05/27/2015 0931   AST 20 05/27/2015 0931   ALT 14 05/27/2015 0931   BILITOT 0.4 05/27/2015 0931     BP Readings from Last 3 Encounters:  10/24/15 174/84  10/16/15 132/96  09/17/15 146/75     Anesthesia Physical Anesthesia Plan  ASA: III  Anesthesia Plan: General   Post-op Pain Management:    Induction: Intravenous  Airway Management Planned: Oral ETT  Additional Equipment:   Intra-op Plan:   Post-operative Plan: Extubation in OR  Informed Consent: I have reviewed the patients History and Physical, chart, labs and discussed the procedure including the risks, benefits and alternatives for the proposed anesthesia with the patient or authorized representative who has indicated his/her understanding and acceptance.   Dental advisory given  Plan Discussed with: CRNA  Anesthesia Plan Comments:         Anesthesia Quick Evaluation

## 2015-10-24 NOTE — Brief Op Note (Signed)
10/24/2015  9:07 PM  PATIENT:  Mitchell [name]  81 y.o. male  PRE-OPERATIVE DIAGNOSIS:  Lumbar stenosis and Herniated Nucleus Pulposus with radiculopathy, kyphosis, spondylosis L 2 - S 1 levels  POST-OPERATIVE DIAGNOSIS:  Lumbar stenosis and Herniated Nucleus Pulposus with radiculopathy, kyphosis, spondylosis L 2 - S 1 levels  PROCEDURE:  Procedure(s) with comments: L4-5 L5-S1 Redo Decompression/Fusion/L2-3 Laminectomy with Right Microdiskectomy, L2-S1 Instrumentation (N/A) - L4-5 L5-S1 Redo Decompression/Fusion/L2-3 Laminectomy with Right Microdiskectomy, L2-S1 Instrumentation with posterolateral arthrodesis  SURGEON:  Surgeon(s) and Role:    * Erline Levine, MD - Primary    * Eustace Moore, MD - Assisting  PHYSICIAN ASSISTANT:   ASSISTANTS: Poteat, RN   ANESTHESIA:   general  EBL:  Total I/O In: 1100 [I.V.:1100] Out: 450 [Urine:300; Blood:150]  BLOOD ADMINISTERED:200 CC CELLSAVER  DRAINS: (Medium) Hemovact drain(s) in the epidural space with  Suction Open   LOCAL MEDICATIONS USED:  MARCAINE    and LIDOCAINE   SPECIMEN:  No Specimen  DISPOSITION OF SPECIMEN:  N/A  COUNTS:  YES  TOURNIQUET:  * No tourniquets in log *  DICTATION: Patient is 81 year old man with  HNP, spondylosis, kyphosis, recurrent stenosis, DDD, radiculopathy L4/5, L5/S1 with autofusion L 34 and large disc herniation L 23 level. He has a severe right greater than left leg pain and weakness. It was elected to take him to surgery for redo  decompression with fusion at L4/5 and L5/S1 levels with laminectomy and microdiscectomy L 23 with pedicle screw fixation L 2- S 1 levels with posterolateral arthrodesis L 2 - S 1 levels.    Procedure: Patient was placed in a prone position on the Bassett table after smooth and uncomplicated induction of general endotracheal anesthesia. His low back was prepped and draped in usual sterile fashion with betadine scrub and DuraPrep. Area of incision was infiltrated with local  lidocaine. Incision was made to the lumbodorsal fascia was incised and exposure was performed of the L2 - S 1levels.  He had previously undergone total laminectomy L 3 - S 1 levels.  Exposure was carried to laminae, facet joints and transverse processes from L 2 - S 1 levels.  Intraoperative x-ray was obtained which confirmed correct orientation. A total laminectomy of L2 through L 3 was performed with disarticulation of the facet joints at this level and thorough decompression was performed of both L2, L3  nerve roots along with the common dural tube. The microscope was brought into the field and right microdiscectomy was performed with removal of multiple subligamentous disc fragments.  This resulted in significant decompression of the thecal sac and nerve roots.  Attention was then turned to the L 45 and L 5 S 1 levels.  There was densely adherent spondylytic material compressing the thecal sac and both L4 and L5 and S 1 nerve roots.  Decompression was greater than would be typically performed for simple interbody fusion. Complete facetectomy of L 5 and L 4 was performed bilaterally and painstaking removal of bone was performed on each side at both levels without violation of the dura.  A thorough discectomy and preparation of the endplates was performed at both the L4/5 and L5/S1 levels.  The interspaces were packed with  and PEEK cages (12 x 23 x 12 degree at L5/S1 and 8 x 23 x 12 degree at L4/5) . Bone autograft was packed within the interspace bilaterally. 30 cc of autograft was placed in the L5/S1 interspace.  After a thorough decompression with bilateral  discectomy at L4/5, 25 cc of autograft was packed in the interspace medial to the first cage. The posterolateral region was extensively decorticated and pedicle probes were placed at L2, L4, L 5 and S1 bilaterally. Intraoperative fluoroscopy confirmed correct orientationin the AP and lateral plane. 35 x 6.5 mm pedicle screws were placed at S1 bilaterally  and 50 x 6.5 mm screws placed at L2, L 4, L 5 bilaterally and 45x 7.7mm screws were placed at S 1 bilaterally.  Final x-rays demonstrated well-positioned interbody grafts and pedicle screw fixation. 130 mm lordotic rods were placed and locked down in situ and the posterolateral region was packed with the remaining medium BMP, 30 cc bone graft  on the right and 10 cc bone graft with 20 cc Vitoss bone graft extender on the left. A medium Hemovac drain was placed and anchored with a stitch.  Long-acting Marcaine was injected in the deep musculature.  Fascia was closed with 1 Vicryl sutures skin edges were reapproximated 2 and 3-0 Vicryl sutures. The wound is dressed with Dermabond and an occlusive dressing. The patient was extubated in the operating room and taken to recovery in stable satisfactory condition having tolerated the operation well. Counts were correct at the end of the case.    PLAN OF CARE: Admit to inpatient   PATIENT DISPOSITION:  PACU - hemodynamically stable.   Delay start of Pharmacological VTE agent (>24hrs) due to surgical blood loss or risk of bleeding: yes

## 2015-10-24 NOTE — Progress Notes (Signed)
Sleepy, but arousable.  MAEW.  Baseline foot drop on right.  Doing well. Observe in Stepdown overnight.

## 2015-10-24 NOTE — Interval H&P Note (Signed)
History and Physical Interval Note:  10/24/2015 7:43 AM  Mitchell [name]  has presented today for surgery, with the diagnosis of Lumbar stenosis  The various methods of treatment have been discussed with the patient and family. After consideration of risks, benefits and other options for treatment, the patient has consented to  Procedure(s) with comments: L4-5 L5-S1 Redo Decompression/Fusion/L2-3 Laminectomy with Right Microdiskectomy (N/A) - L4-5 L5-S1 Redo Decompression/Fusion/L2-3 Laminectomy with Right Microdiskectomy as a surgical intervention .  The patient's history has been reviewed, patient examined, no change in status, stable for surgery.  I have reviewed the patient's chart and labs.  Questions were answered to the patient's satisfaction.     Heith Haigler D

## 2015-10-24 NOTE — Transfer of Care (Signed)
Immediate Anesthesia Transfer of Care Note  Patient: Mitchell [name]  Procedure(s) Performed: Procedure(s) with comments: L4-5 L5-S1 Redo Decompression/Fusion/L2-3 Laminectomy with Right Microdiskectomy, L2-S1 Instrumentation (N/A) - L4-5 L5-S1 Redo Decompression/Fusion/L2-3 Laminectomy with Right Microdiskectomy, L2-S1 Instrumentation  Patient Location: PACU  Anesthesia Type:General  Level of Consciousness: sedated, patient cooperative and responds to stimulation  Airway & Oxygen Therapy: Patient Spontanous Breathing and Patient connected to face mask oxygen  Post-op Assessment: Report given to RN, Post -op Vital signs reviewed and stable and Patient moving all extremities X 4  Post vital signs: Reviewed and stable  Last Vitals:  Filed Vitals:   10/24/15 1143 10/24/15 2128  BP: 174/84 110/72  Pulse: 56 84  Temp: 36.9 C 36.3 C  Resp: 18 15    Last Pain:  Filed Vitals:   10/24/15 2131  PainSc: Asleep      Patients Stated Pain Goal: 2 ([id number])  Complications: No apparent anesthesia complications

## 2015-10-25 MED ORDER — FAMOTIDINE 20 MG PO TABS
20.0000 mg | ORAL_TABLET | Freq: Two times a day (BID) | ORAL | Status: DC
  Administered 2015-10-25 – 2015-10-29 (×8): 20 mg via ORAL
  Filled 2015-10-25 (×8): qty 1

## 2015-10-25 NOTE — Evaluation (Signed)
Physical Therapy Evaluation Patient Details Name: STEFANIE NEVITT MRN: [id number] DOB: April 06, 1935 Today's Date: 10/25/2015   History of Present Illness  Pt presents with progressive pain and weakness bilateral LE's, L>R though with longstanding R foot drop. Underwent PLIF L2-S1. PMH: lumbar diskectomies multiple levels, CHF, HTN, apnea. Broke R ankle about a month ago and has been wearing CAM boot  Clinical Impression  Pt admitted with above diagnosis. Pt currently with functional limitations due to the deficits listed below (see PT Problem List). Pt requires +2 mod/ max A to get to EOB and to perform sit to stand transfer. Pt unable to walk at this time.  Pt will benefit from skilled PT to increase their independence and safety with mobility to allow discharge to the venue listed below.       Follow Up Recommendations SNF;Supervision/Assistance - 24 hour    Equipment Recommendations  None recommended by PT    Recommendations for Other Services OT consult     Precautions / Restrictions Precautions Precautions: Fall;Back Precaution Booklet Issued: No Precaution Comments: reviewed back precautions but he is confused today and will need reminding Required Braces or Orthoses: Spinal Brace Spinal Brace: Lumbar corset;Applied in sitting position Restrictions Weight Bearing Restrictions: No Other Position/Activity Restrictions: CAM boot for RLE due to ankle fx, shoe for left foot      Mobility  Bed Mobility Overal bed mobility: Needs Assistance;+2 for physical assistance Bed Mobility: Rolling;Sidelying to Sit;Sit to Supine Rolling: +2 for physical assistance;Mod assist Sidelying to sit: Max assist;+2 for physical assistance   Sit to supine: +2 for physical assistance;Max assist   General bed mobility comments: hand over hand cues for sequencing and then mod A for bridging knees and rolling to left. Mod A for legs off bed and elevation of trunk. With return to bed, max A +3 for return  to supine and scoot to [facility]. However, once positioned, pt was able to roll each side with min A and use of rail   Transfers Overall transfer level: Needs assistance Equipment used: Rolling walker (2 wheeled) Transfers: Sit to/from Stand Sit to Stand: Max assist;+2 physical assistance         General transfer comment: max A +2 for power up from bed, performed 3x. Pt unable to step feet in standing and maintained crouched posture despite verbal and tactile cues  Ambulation/Gait             General Gait Details: unable  Stairs            Wheelchair Mobility    Modified Rankin (Stroke Patients Only)       Balance Overall balance assessment: Needs assistance Sitting-balance support: Bilateral upper extremity supported Sitting balance-Leahy Scale: Poor     Standing balance support: Bilateral upper extremity supported Standing balance-Leahy Scale: Poor                               Pertinent Vitals/Pain Pain Assessment: Faces Faces Pain Scale: Hurts even more Pain Location: back Pain Descriptors / Indicators: Aching Pain Intervention(s): Limited activity within patient's tolerance;Monitored during session    Home Living Family/patient expects to be discharged to:: Private residence Living Arrangements: Spouse/significant other Available Help at Discharge: Family;Available 24 hours/day Type of Home: House Home Access: Ramped entrance     Home Layout: One level Home Equipment: Electric scooter Additional Comments: wife wants to take husband home but he is over 6 ft tall and >250  lbs and family reports that she has already been having a hard time caring for hom before the surgery    Prior Function Level of Independence: Needs assistance   Gait / Transfers Assistance Needed: per sister in law, pt needed max A to ambulate so was not really doing it but eather using electric scooter  ADL's / Homemaking Assistance Needed: wife was helping with  bathing and dressing        Hand Dominance        Extremity/Trunk Assessment   Upper Extremity Assessment: Defer to OT evaluation           Lower Extremity Assessment: Generalized weakness;RLE deficits/detail;LLE deficits/detail RLE Deficits / Details: hip flex 2/5, knee flex/ ext 2/5, ankle NT due to fx LLE Deficits / Details: hip flex 2/5, knee flex/ ext 2/5  Cervical / Trunk Assessment: Kyphotic  Communication   Communication: No difficulties  Cognition Arousal/Alertness: Lethargic;Suspect due to medications Behavior During Therapy: Flat affect Overall Cognitive Status: Impaired/Different from baseline Area of Impairment: Orientation;Memory;Following commands;Safety/judgement;Awareness;Problem solving;Attention Orientation Level: Disoriented to;Time;Situation;Place Current Attention Level: Focused Memory: Decreased short-term memory Following Commands: Follows one step commands consistently Safety/Judgement: Decreased awareness of safety;Decreased awareness of deficits Awareness: Intellectual Problem Solving: Slow processing;Requires verbal cues;Requires tactile cues General Comments: pt confused today, talking about nonsensical things throughout eval    General Comments      Exercises        Assessment/Plan    PT Assessment Patient needs continued PT services  PT Diagnosis Difficulty walking;Abnormality of gait;Generalized weakness;Acute pain   PT Problem List Decreased strength;Decreased activity tolerance;Decreased balance;Decreased mobility;Decreased cognition;Decreased knowledge of use of DME;Decreased knowledge of precautions;Pain;Obesity  PT Treatment Interventions DME instruction;Gait training;Functional mobility training;Therapeutic activities;Therapeutic exercise;Balance training;Patient/family education;Cognitive remediation   PT Goals (Current goals can be found in the Care Plan section) Acute Rehab PT Goals Patient Stated Goal: pt unable to state.  Sister in law states that wife would like to take pt home but rest of family feels that pt needs to go to rehab first PT Goal Formulation: With patient/family Time For Goal Achievement: 11/08/15 Potential to Achieve Goals: Good    Frequency Min 3X/week   Barriers to discharge        Co-evaluation               End of Session Equipment Utilized During Treatment: Gait belt;Back brace Activity Tolerance: Patient limited by pain;Patient limited by fatigue Patient left: in bed;with call bell/phone within reach;with family/visitor present Nurse Communication: Mobility status         Time: 1205-1238 PT Time Calculation (min) (ACUTE ONLY): 33 min   Charges:   PT Evaluation $PT Eval Moderate Complexity: 1 Procedure PT Treatments $Therapeutic Activity: 8-22 mins   PT G Codes:       Leighton Roach, PT  Acute Rehab Services  [place], [place] 10/25/2015, 2:12 PM

## 2015-10-25 NOTE — Progress Notes (Signed)
Patient ID: Mitchell [name], male   DOB: 1934-09-29, 81 y.o.   MRN: [id number] Seems to be doing fairly well. He complains of some left buttock pain. No leg pain. There is some weakness of the right ankle but the family states that that is old and he has a recent fracture of the right ankle. Otherwise had good movement of lower extremities. Try to mobilize today. DC Foley.

## 2015-10-25 NOTE — Progress Notes (Signed)
Removed foley catheter at 1030 per MD order , pt unable to void. Bladder scan shows 59mls. Will continue to monitor. Consuelo Pandy RN

## 2015-10-26 NOTE — Progress Notes (Signed)
Patient ID: Mitchell [name], male   DOB: October 02, 1934, 81 y.o.   MRN: [id number] Subjective: Patient reports he is feeling somewhat better. Still some back soreness.  Objective: Vital signs in last 24 hours: Temp:  [98.4 F (36.9 C)-99.6 F (37.6 C)] 98.8 F (37.1 C) (07/09 0734) Pulse Rate:  [73-86] 78 (07/09 0734) Resp:  [16-21] 17 (07/09 0734) BP: (107-139)/(58-70) 118/61 mmHg (07/09 0734) SpO2:  [91 %-98 %] 98 % (07/09 0734)  Intake/Output from previous day: 07/08 0701 - 07/09 0700 In: 1625 [P.O.:1100; I.V.:375; IV Piggyback:150] Out: [id number] [Urine:925; Drains:550] Intake/Output this shift:    No change in exam. Some chronic weakness of distal right lower extremity.  Lab Results: Lab Results  Component Value Date   WBC 8.1 10/16/2015   HGB 14.3 10/16/2015   HCT 44.3 10/16/2015   MCV 98.0 10/16/2015   PLT 260 10/16/2015   Lab Results  Component Value Date   INR 1.62* 05/25/2011   BMET Lab Results  Component Value Date   NA 136 10/16/2015   K 4.3 10/16/2015   CL 102 10/16/2015   CO2 28 10/16/2015   GLUCOSE 108* 10/16/2015   BUN 11 10/16/2015   CREATININE 0.60* 10/16/2015   CALCIUM 8.7* 10/16/2015    Studies/Results: Dg Lumbar Spine 2-3 Views  10/24/2015  CLINICAL DATA:  L4-5 and L5-S1 redo decompression and fusion with L2-3 laminectomy and right microdiskectomy. EXAM: LUMBAR SPINE - 2-3 VIEW; DG C-ARM 61-120 MIN COMPARISON:  08/27/2015 lumbar spine radiographs FINDINGS: Fluoroscopy time 1 minutes 1 second. Multiple spot fluoroscopic nondiagnostic intraoperative radiographs of the lumbar spine demonstrate postsurgical changes from multilevel bilateral posterior lumbar spine fusion. IMPRESSION: Intraoperative fluoroscopic guidance for lumbar fusion. Electronically Signed   By: Ilona Sorrel M.D.   On: 10/24/2015 20:46   Dg C-arm 1-60 Min  10/24/2015  CLINICAL DATA:  L4-5 and L5-S1 redo decompression and fusion with L2-3 laminectomy and right microdiskectomy. EXAM: LUMBAR  SPINE - 2-3 VIEW; DG C-ARM 61-120 MIN COMPARISON:  08/27/2015 lumbar spine radiographs FINDINGS: Fluoroscopy time 1 minutes 1 second. Multiple spot fluoroscopic nondiagnostic intraoperative radiographs of the lumbar spine demonstrate postsurgical changes from multilevel bilateral posterior lumbar spine fusion. IMPRESSION: Intraoperative fluoroscopic guidance for lumbar fusion. Electronically Signed   By: Ilona Sorrel M.D.   On: 10/24/2015 20:46    Assessment/Plan: Seems to be doing okay. Transfer to floor today.   LOS: 2 days    Alvaro Aungst S 10/26/2015, 8:33 AM

## 2015-10-26 NOTE — Progress Notes (Signed)
OT Cancellation Note  Patient Details Name: DAG ROHLOFF MRN: [id number] DOB: 19-Mar-1935   Cancelled Treatment:    Reason Eval/Treat Not Completed: Other (comment). Pt discharging to SNF for post-acute rehab. Will defer OT evaluation and treatment to next venue of care.  Redmond Baseman, OTR/L Pager: [phone] 10/26/2015, 4:47 PM

## 2015-10-26 NOTE — Clinical Social Work Note (Signed)
Clinical Social Work Assessment  Patient Details  Name: Mitchell [name] MRN: [id number] Date of Birth: February 28, 1935  Date of referral:  10/26/15               Reason for consult:  Facility Placement, Discharge Planning                Permission sought to share information with:  Family Supports, Customer service manager, Case Optician, dispensing granted to share information::  Yes, Verbal Permission Granted  Name::      Nickolas Madrid)  Agency::   (SNF's )  Relationship::   (Wife)  Contact Information:   [phone])  Housing/Transportation Living arrangements for the past 2 months:  Single Family Home Source of Information:  Patient, Adult Children Patient Interpreter Needed:  None Criminal Activity/Legal Involvement Pertinent to Current Situation/Hospitalization:  No - Comment as needed Significant Relationships:  Spouse, Adult Children, Other Family Members Lives with:  Spouse Do you feel safe going back to the place where you live?  No Need for family participation in patient care:  Yes (Comment)  Care giving concerns:  PT recommending SNF placement for ST rehab. Family prefers [facility].   Social Worker assessment / plan:  Holiday representative met with patient at bedside in reference to post acute placement for SNF. CSW introduced CSW role and SNF process. SNF list reviewed and provided at bedside.  Patient and family prefers [facility] for ST rehab placement. CSW encouraged patient explore other options with family in the event [facility] is not available. Patient expressed understanding. No further concerns reported at this time. CSW will continue to follow pt and pt's family for continued support and to facilitate d/c needs once medically stable.   Employment status:  Retired Nurse, adult PT Recommendations:  Startup / Referral to community resources:  [place]  Patient/Family's Response to care:  Pt a/o x4. Pt agreeable to SNF and prefers [facility]. Pt and son-in-law pleasant and appreciated social work intervention.  Patient/Family's Understanding of and Emotional Response to Diagnosis, Current Treatment, and Prognosis:  Patient knowledgeable of surgical procedure and medical interventions.   Emotional Assessment Appearance:  Appears stated age Attitude/Demeanor/Rapport:   (Pleasant ) Affect (typically observed):  Accepting, Appropriate, Pleasant Orientation:  Oriented to Situation, Oriented to  Time, Oriented to Place Alcohol / Substance use:  Never Used Psych involvement (Current and /or in the community):  No (Comment)  Discharge Needs  Concerns to be addressed:  Care Coordination Readmission within the last 30 days:  No Current discharge risk:  Dependent with Mobility Barriers to Discharge:  Continued Medical Work up   [employer] A, LCSW 10/26/2015, 3:02 PM

## 2015-10-26 NOTE — Anesthesia Postprocedure Evaluation (Signed)
Anesthesia Post Note  Patient: Mitchell [name]  Procedure(s) Performed: Procedure(s) (LRB): L4-5 L5-S1 Redo Decompression/Fusion/L2-3 Laminectomy with Right Microdiskectomy, L2-S1 Instrumentation (N/A)  Patient location during evaluation: PACU Anesthesia Type: General Level of consciousness: awake and alert and patient cooperative Pain management: pain level controlled Vital Signs Assessment: post-procedure vital signs reviewed and stable Respiratory status: spontaneous breathing and respiratory function stable Cardiovascular status: stable Anesthetic complications: no    Last Vitals:  Filed Vitals:   10/26/15 0436 10/26/15 0734  BP: 139/62 118/61  Pulse: 73 78  Temp: 37.6 C 37.1 C  Resp: 21 17    Last Pain:  Filed Vitals:   10/26/15 0748  PainSc: [place]

## 2015-10-26 NOTE — Progress Notes (Signed)
Called report to [facility] RN on [facility], will transfer pt in the bed. Consuelo Pandy RN

## 2015-10-26 NOTE — Clinical Social Work Placement (Signed)
   CLINICAL SOCIAL WORK PLACEMENT  NOTE  Date:  10/26/2015  Patient Details  Name: Mitchell [name] MRN: [id number] Date of Birth: [birth year]  Clinical Social Work is seeking post-discharge placement for this patient at the [place] level of care (*CSW will initial, date and re-position this form in  chart as items are completed):  Yes   Patient/family provided with [place] Work Department's list of facilities offering this level of care within the geographic area requested by the patient (or if unable, by the patient's family).  Yes   Patient/family informed of their freedom to choose among providers that offer the needed level of care, that participate in Medicare, Medicaid or managed care program needed by the patient, have an available bed and are willing to accept the patient.  Yes   Patient/family informed of [facility]'s ownership interest in [facility] and [facility], as well as of the fact that they are under no obligation to receive care at these facilities.  PASRR submitted to EDS on 10/26/15     PASRR number received on 10/26/15     Existing PASRR number confirmed on       FL2 transmitted to all facilities in geographic area requested by pt/family on 10/26/15     FL2 transmitted to all facilities within larger geographic area on       Patient informed that his/her managed care company has contracts with or will negotiate with certain facilities, including the following:            Patient/family informed of bed offers received.  Patient chooses bed at       Physician recommends and patient chooses bed at      Patient to be transferred to   on  .  Patient to be transferred to facility by       Patient family notified on   of transfer.  Name of family member notified:        PHYSICIAN Please prepare priority discharge summary, including medications, Please prepare prescriptions, Please sign FL2     Additional  Comment:    _______________________________________________ Rigoberto Noel, LCSW 10/26/2015, 2:11 PM

## 2015-10-26 NOTE — NC FL2 (Signed)
[place] LEVEL OF CARE SCREENING TOOL     IDENTIFICATION  Patient Name: Mitchell [name] Birthdate: 12-02-1934 Sex: male Admission Date (Current Location): 10/24/2015  [name] and [place] Number:  Herbalist and Address:  [facility] [place], [place] [address], [place], [place] [address]      Provider Number: [id number]  Attending Physician Name and Address:  Erline Levine, MD  Relative Name and Phone Number:       Current Level of Care: Hospital Recommended Level of Care: [place] Prior Approval Number:    Date Approved/Denied:   PASRR Number: [id number]  Discharge Plan: SNF    Current Diagnoses: Patient Active Problem List   Diagnosis Date Noted  . Lumbar stenosis with neurogenic claudication 10/24/2015  . Status post laminectomy with spinal fusion 10/24/2015  . Atrial flutter with RVR- s/p TEE CV Feb 2013 05/25/2011  . Anticoagulant causing adverse effect in therapeutic use 05/25/2011  . LV dysfunction, EF 45% in AF- >55% May 2013 in NSR 05/25/2011  . CHF, acute, mild secondary to EF 40-45% an a. flutter with RVR 05/25/2011  . Erectile dysfunction 05/16/2011  . Low testosterone 05/16/2011  . BPH (benign prostatic hyperplasia) 05/16/2011  . History of recurrent urinary tract infection 05/16/2011  . Spinal stenosis 05/16/2011  . HTN (hypertension), 09/17/2010  . Hyperlipidemia 09/17/2010  . CAD- BMS to PLA 2001, 70% mid LAD, 50-60% prox. LCX, 60% PDA- Low risk Myoview Jan 2013 09/17/2010  . Morbid obesity ([place]) 09/17/2010  . Sleep apnea, intolerant to cpap 09/17/2010  . Pituitary microadenoma with hyperprolactinemia ([place]) 09/17/2010  . Asthma 09/17/2010  . Osteoarthritis 09/17/2010  . History of melanoma in situ 09/17/2010  . Hx of adenomatous colonic polyps 09/17/2010    Orientation RESPIRATION BLADDER Height & Weight     Self, Time, Situation, Place  O2 (2L) Continent Weight: 122.471 kg (270 lb) Height:   6\' 3"  (190.5 cm)  BEHAVIORAL SYMPTOMS/MOOD NEUROLOGICAL BOWEL NUTRITION STATUS   (NONE)  (NONE) Continent Diet (Heart healthy)  AMBULATORY STATUS COMMUNICATION OF NEEDS Skin   Extensive Assist Verbally Other (Comment) (Incision of back. Closed system drain 1 posterior back accordion (hemovac) 10Fr.)                       Personal Care Assistance Level of Assistance  Bathing, Feeding, Dressing Bathing Assistance: Limited assistance Feeding assistance: Independent Dressing Assistance: Limited assistance     Functional Limitations Info  Sight, Hearing, Speech Sight Info: Adequate Hearing Info: Adequate Speech Info: Adequate    SPECIAL CARE FACTORS FREQUENCY  PT (By licensed PT), OT (By licensed OT)     PT Frequency: 5/week OT Frequency: 5/week            Contractures Contractures Info: Not present    Additional Factors Info  Code Status, Allergies, Psychotropic Code Status Info: Full Allergies Info: Statins, adesives Psychotropic Info: Cymbalta         Current Medications (10/26/2015):  This is the current hospital active medication list Current Facility-Administered Medications  Medication Dose Route Frequency Provider Last Rate Last Dose  . 0.9 %  sodium chloride infusion  250 mL Intravenous Continuous Erline Levine, MD      . acetaminophen (TYLENOL) tablet 650 mg  650 mg Oral Q4H PRN Erline Levine, MD       Or  . acetaminophen (TYLENOL) suppository 650 mg  650 mg Rectal Q4H PRN Erline Levine, MD      .  alum & mag hydroxide-simeth (MAALOX/MYLANTA) 200-200-20 MG/5ML suspension 30 mL  30 mL Oral Q6H PRN Erline Levine, MD      . amLODipine (NORVASC) tablet 5 mg  5 mg Oral Daily Erline Levine, MD   5 mg at 10/26/15 1052  . atenolol (TENORMIN) tablet 25 mg  25 mg Oral Daily Erline Levine, MD   25 mg at 10/26/15 1052  . bisacodyl (DULCOLAX) suppository 10 mg  10 mg Rectal Daily PRN Erline Levine, MD      . calcium carbonate (OS-CAL - dosed in mg of elemental calcium) tablet  1,250 mg  1,250 mg Oral BID WC Erline Levine, MD   1,250 mg at 10/26/15 1052  . dextrose 5 % and 0.45 % NaCl with KCl 20 mEq/L infusion   Intravenous Continuous Erline Levine, MD 75 mL/hr at 10/25/15 1100    . docusate sodium (COLACE) capsule 100 mg  100 mg Oral BID Erline Levine, MD   100 mg at 10/25/15 2157  . DULoxetine (CYMBALTA) DR capsule 30 mg  30 mg Oral Daily Erline Levine, MD   30 mg at 10/26/15 1052  . ezetimibe (ZETIA) tablet 10 mg  10 mg Oral Daily Erline Levine, MD   10 mg at 10/26/15 1052  . famotidine (PEPCID) tablet 20 mg  20 mg Oral BID Erline Levine, MD   20 mg at 10/26/15 1052  . furosemide (LASIX) tablet 20 mg  20 mg Oral Ninfa Meeker, MD   20 mg at 10/25/15 1016  . HYDROcodone-acetaminophen (NORCO/VICODIN) 5-325 MG per tablet 1-2 tablet  1-2 tablet Oral Q4H PRN Erline Levine, MD   1 tablet at 10/25/15 2059  . hydrocortisone cream 1 % 1 application  1 application Topical BID Erline Levine, MD   1 application at 0000000 1101  . HYDROmorphone (DILAUDID) injection 0.5-1 mg  0.5-1 mg Intravenous Q2H PRN Erline Levine, MD   1 mg at 10/26/15 0015  . menthol-cetylpyridinium (CEPACOL) lozenge 3 mg  1 lozenge Oral PRN Erline Levine, MD       Or  . phenol (CHLORASEPTIC) mouth spray 1 spray  1 spray Mouth/Throat PRN Erline Levine, MD      . methocarbamol (ROBAXIN) tablet 500 mg  500 mg Oral Q6H PRN Erline Levine, MD   500 mg at 10/25/15 1844   Or  . methocarbamol (ROBAXIN) 500 mg in dextrose 5 % 50 mL IVPB  500 mg Intravenous Q6H PRN Erline Levine, MD      . mometasone-formoterol [facility]) 200-5 MCG/ACT inhaler 2 puff  2 puff Inhalation BID Erline Levine, MD   2 puff at 10/26/15 0724  . ondansetron (ZOFRAN) injection 4 mg  4 mg Intravenous Q4H PRN Erline Levine, MD      . oxyCODONE-acetaminophen (PERCOCET/ROXICET) 5-325 MG per tablet 1-2 tablet  1-2 tablet Oral Q4H PRN Erline Levine, MD   1 tablet at 10/26/15 [phone]  . pantoprazole (PROTONIX) EC tablet 40 mg  40 mg Oral Daily Erline Levine, MD   40  mg at 10/26/15 1052  . polyethylene glycol (MIRALAX / GLYCOLAX) packet 17 g  17 g Oral QHS Erline Levine, MD   17 g at 10/25/15 2157  . polyvinyl alcohol (LIQUIFILM TEARS) 1.4 % ophthalmic solution 1 drop  1 drop Both Eyes TID PRN Erline Levine, MD      . senna-docusate (Senokot-S) tablet 1 tablet  1 tablet Oral QHS PRN Erline Levine, MD      . sodium chloride flush (NS) 0.9 % injection 3  mL  3 mL Intravenous Q12H Erline Levine, MD   3 mL at 10/26/15 1102  . sodium chloride flush (NS) 0.9 % injection 3 mL  3 mL Intravenous PRN Erline Levine, MD      . sodium phosphate (FLEET) 7-19 GM/118ML enema 1 enema  1 enema Rectal Once PRN Erline Levine, MD      . zolpidem [name]) tablet 5 mg  5 mg Oral QHS PRN Erline Levine, MD         Discharge Medications: Please see discharge summary for a list of discharge medications.  Relevant Imaging Results:  Relevant Lab Results:   Additional Information SSN: [id number]  Rigoberto Noel, LCSW

## 2015-10-27 MED FILL — Heparin Sodium (Porcine) Inj 1000 Unit/ML: INTRAMUSCULAR | Qty: 30 | Status: AC

## 2015-10-27 MED FILL — Sodium Chloride IV Soln 0.9%: INTRAVENOUS | Qty: 2000 | Status: AC

## 2015-10-27 NOTE — Care Management Important Message (Signed)
Important Message  Patient Details  Name: Mitchell [name] MRN: [id number] Date of Birth: 04/06/1935   Medicare Important Message Given:  Yes    [name] 10/27/2015, 9:02 AM

## 2015-10-27 NOTE — Progress Notes (Signed)
Physical Therapy Treatment Patient Details Name: ARVESTER BRANDOW MRN: [id number] DOB: 1934-06-27 Today's Date: 10/27/2015    History of Present Illness Pt presents with progressive pain and weakness bilateral LE's, L>R though with longstanding R foot drop. Underwent PLIF L2-S1. PMH: lumbar diskectomies multiple levels, CHF, HTN, apnea. Broke R ankle about a month ago and has been wearing CAM boot    PT Comments    Pt more alert and able to participate in PT at this time. Pt able to stand and complete transfer to chair with minA x2. Pt demo's R sided weakness due to injured shoulder and R ankle fracture but was able to tolerate std pvt to chair. Pt with good home set up and support at home. Recommending CIR upon d/c for maximal functional return for safe transition home with spouse.   Follow Up Recommendations  Supervision/Assistance - 24 hour;CIR     Equipment Recommendations  None recommended by PT    Recommendations for Other Services OT consult     Precautions / Restrictions Precautions Precautions: Fall;Back Precaution Booklet Issued: No Precaution Comments: educated pt on back precautions, family present as well with good understanding Required Braces or Orthoses: Spinal Brace Spinal Brace: Lumbar corset;Applied in sitting position Restrictions Weight Bearing Restrictions: No Other Position/Activity Restrictions: CAM boot for RLE due to ankle fx, shoe for left foot    Mobility  Bed Mobility Overal bed mobility: Needs Assistance Bed Mobility: Rolling;Sidelying to Sit Rolling: Mod assist;+2 for physical assistance Sidelying to sit: Mod assist;+2 for physical assistance       General bed mobility comments: minA for L LE knee bending, v/c's for rolling, maxA for trunk elevation, modA for LE management off EOB  Transfers Overall transfer level: Needs assistance Equipment used: Rolling walker (2 wheeled) Transfers: Sit to/from [facility] Sit to Stand:  Mod assist;+2 physical assistance;+2 safety/equipment Stand pivot transfers: Min assist;+2 physical assistance       General transfer comment: mod/maxA with bed elevated, once pt up and holding onto walker pt minAx2 to maintain standing  Ambulation/Gait         Gait velocity: slow   General Gait Details: limited to std pvt to chair to L side. pt able to complete with RW with verbal directional cues   Stairs            Wheelchair Mobility    Modified Rankin (Stroke Patients Only)       Balance Overall balance assessment: Needs assistance Sitting-balance support: Bilateral upper extremity supported Sitting balance-Leahy Scale: Poor Sitting balance - Comments: requires bilat UE to off weight back and maintain balance   Standing balance support: Bilateral upper extremity supported Standing balance-Leahy Scale: Poor                      Cognition Arousal/Alertness: Awake/alert Behavior During Therapy: WFL for tasks assessed/performed Overall Cognitive Status: Impaired/Different from baseline Area of Impairment: Memory     Memory: Decreased short-term memory              Exercises      General Comments        Pertinent Vitals/Pain Pain Assessment: 0-10 Pain Score: 8  Pain Location: back Pain Intervention(s): Premedicated before session    Home Living                      Prior Function            PT Goals (current goals can  now be found in the care plan section) Progress towards PT goals: Progressing toward goals    Frequency  Min 5X/week    PT Plan Current plan remains appropriate    Co-evaluation             End of Session Equipment Utilized During Treatment: Gait belt;Back brace Activity Tolerance: Patient limited by fatigue;Patient limited by pain Patient left: in chair;with call bell/phone within reach;with family/visitor present     Time: 1355-1425 PT Time Calculation (min) (ACUTE ONLY): 30  min  Charges:  $Gait Training: 8-22 mins $Therapeutic Activity: 8-22 mins                    G Codes:      Kingsley Callander 10/27/2015, 3:32 PM   Kittie Plater, PT, DPT Pager #: [phone] Office #: [phone]

## 2015-10-27 NOTE — Care Management Note (Addendum)
Case Management Note  Patient Details  Name: Mitchell [name] MRN: [id number] Date of Birth: September 26, 1934  Subjective/Objective:  Pt underwent: L4-5 L5-S1 Redo Decompression/Fusion/L2-3 Laminectomy with Right Microdiskectomy, L2-S1 Instrumentation. He is from home with his spouse.               Action/Plan: PT recommendations are for CIR. CM following for d/c needs.  Expected Discharge Date:                  Expected Discharge Plan:  Reserve  In-House Referral:     Discharge planning Services     Post Acute Care Choice:    Choice offered to:     DME Arranged:    DME Agency:     HH Arranged:    [place] Agency:     Status of Service:  In process, will continue to follow  If discussed at Long Length of Stay Meetings, dates discussed:    Additional Comments:  Pollie Friar, RN 10/27/2015, 3:40 PM

## 2015-10-27 NOTE — Progress Notes (Signed)
Subjective: Patient reports "I don't hurt any right now"  Objective: Vital signs in last 24 hours: Temp:  [97.7 F (36.5 C)-99.7 F (37.6 C)] 98.1 F (36.7 C) (07/10 0540) Pulse Rate:  [72-88] 75 (07/10 0540) Resp:  [18-20] 18 (07/10 0540) BP: (118-149)/(63-77) 144/67 mmHg (07/10 0540) SpO2:  [92 %-96 %] 93 % (07/10 0540)  Intake/Output from previous day: 07/09 0701 - 07/10 0700 In: 240 [P.O.:240] Out: 1515 [Urine:1275; Drains:240] Intake/Output this shift:    Awakens to voice. Wife present. Good strength LLE; Rt ankle with some weakness as pre-op (& recovering ankle Fx), both hip flexors strong. INcision flat without erythema or drainage beneath [place] & Dermabond. Hemovac patent ~128ml overnight. Only up to bedside over weekend. Condom cath for night d/t difficulties with urinal; but no retention.   Lab Results: No results for input(s): WBC, HGB, HCT, PLT in the last 72 hours. BMET No results for input(s): NA, K, CL, CO2, GLUCOSE, BUN, CREATININE, CALCIUM in the last 72 hours.  Studies/Results: No results found.  Assessment/Plan: Improving   LOS: 3 days  Continuing cautious pain medication as needed; continuing hemovac today. Wife hopeful of HHPT/OT when appropriate for discharge, but might entertain SNF option if [facility] has opening or CIR not an option. Will see what PT has to say today. Rt ankle Fx and boot complicate his mobility.    Mitchell [name] 10/27/2015, 7:58 AM  I agree with note above. mobilize as tolerated. Pain controlled, no change in exam, await placement, continue hemovac for now

## 2015-10-28 DIAGNOSIS — M75101 Unspecified rotator cuff tear or rupture of right shoulder, not specified as traumatic: Secondary | ICD-10-CM

## 2015-10-28 DIAGNOSIS — I4892 Unspecified atrial flutter: Secondary | ICD-10-CM | POA: Insufficient documentation

## 2015-10-28 DIAGNOSIS — G8918 Other acute postprocedural pain: Secondary | ICD-10-CM | POA: Insufficient documentation

## 2015-10-28 DIAGNOSIS — I2510 Atherosclerotic heart disease of native coronary artery without angina pectoris: Secondary | ICD-10-CM | POA: Insufficient documentation

## 2015-10-28 DIAGNOSIS — M21371 Foot drop, right foot: Secondary | ICD-10-CM | POA: Insufficient documentation

## 2015-10-28 DIAGNOSIS — E669 Obesity, unspecified: Secondary | ICD-10-CM

## 2015-10-28 DIAGNOSIS — S82891A Other fracture of right lower leg, initial encounter for closed fracture: Secondary | ICD-10-CM

## 2015-10-28 DIAGNOSIS — R413 Other amnesia: Secondary | ICD-10-CM | POA: Insufficient documentation

## 2015-10-28 DIAGNOSIS — M4727 Other spondylosis with radiculopathy, lumbosacral region: Secondary | ICD-10-CM

## 2015-10-28 DIAGNOSIS — R4189 Other symptoms and signs involving cognitive functions and awareness: Secondary | ICD-10-CM

## 2015-10-28 DIAGNOSIS — Z981 Arthrodesis status: Secondary | ICD-10-CM

## 2015-10-28 DIAGNOSIS — N135 Crossing vessel and stricture of ureter without hydronephrosis: Secondary | ICD-10-CM | POA: Insufficient documentation

## 2015-10-28 DIAGNOSIS — M4806 Spinal stenosis, lumbar region: Principal | ICD-10-CM

## 2015-10-28 DIAGNOSIS — Z419 Encounter for procedure for purposes other than remedying health state, unspecified: Secondary | ICD-10-CM | POA: Insufficient documentation

## 2015-10-28 DIAGNOSIS — M75100 Unspecified rotator cuff tear or rupture of unspecified shoulder, not specified as traumatic: Secondary | ICD-10-CM | POA: Insufficient documentation

## 2015-10-28 DIAGNOSIS — M19011 Primary osteoarthritis, right shoulder: Secondary | ICD-10-CM

## 2015-10-28 MED ORDER — MANAGING BACK PAIN BOOK
Freq: Once | Status: AC
  Administered 2015-10-28: 05:00:00
  Filled 2015-10-28: qty 1

## 2015-10-28 NOTE — Progress Notes (Signed)
Physical Therapy Treatment Patient Details Name: Mitchell [name] MRN: [id number] DOB: 1935-02-14 Today's Date: 10/28/2015    History of Present Illness Pt presents with progressive pain and weakness bilateral LE's, L>R though with longstanding R foot drop. Underwent PLIF L2-S1. PMH: lumbar diskectomies multiple levels, CHF, HTN, apnea. Broke R ankle about a month ago and has been wearing CAM boot    PT Comments    Pt continues to progress well with mobility with less overall assistance needed today. Acute PT to continue during pt's hospital stay to progress mobility toward goals.  Follow Up Recommendations  Supervision/Assistance - 24 hour;CIR     Equipment Recommendations  None recommended by PT    Recommendations for Other Services OT consult     Precautions / Restrictions Precautions Precautions: Fall;Back Precaution Comments: reviewed all back precautions as pt unable to recall any of them Required Braces or Orthoses: Spinal Brace Spinal Brace: Lumbar corset;Applied in sitting position (pt able to provide min assist with donning brace) Restrictions Weight Bearing Restrictions: No Other Position/Activity Restrictions: CAM boot for RLE due to ankle fx, shoe for left foot    Mobility  Bed Mobility Overal bed mobility: Needs Assistance   Rolling: Mod assist Sidelying to sit: Mod assist;HOB elevated       General bed mobility comments: assist needed to bend knees prior to rolling in bed. mod assist for rolling and for sitting up to edge of bed. cues throughout for sequencing and technique.  Transfers Overall transfer level: Needs assistance Equipment used: Rolling walker (2 wheeled) Transfers: Sit to/from [facility] Sit to Stand: Min assist;+2 physical assistance;From elevated surface Stand pivot transfers: Min assist;+2 safety/equipment       General transfer comment: bed elevated to assist wtih standing. cues needed for hand placement and for  weight shifting. assist needed to power up into standing. Once standing pt took 4-5 pivot steps with RW from bed to recliner chair. cues on hand placement with sitting down. assist needed due to uncontrolled descent with sitting down.                                  Cognition Arousal/Alertness: Awake/alert Behavior During Therapy: WFL for tasks assessed/performed Overall Cognitive Status: Impaired/Different from baseline Area of Impairment: Memory     Memory: Decreased recall of precautions;Decreased short-term memory Following Commands: Follows one step commands consistently     Problem Solving: Slow processing;Requires verbal cues;Requires tactile cues      Exercises General Exercises - Lower Extremity Ankle Circles/Pumps: AROM;Both;10 reps;Supine Quad Sets: AROM;Strengthening;Both;10 reps;Supine     Pertinent Vitals/Pain Pain Score: 8  Pain Location: back Pain Descriptors / Indicators: Aching;Operative site guarding;Sore Pain Intervention(s): Limited activity within patient's tolerance;Monitored during session;Premedicated before session;Repositioned;Patient requesting pain meds-RN notified     PT Goals (current goals can now be found in the care plan section) Acute Rehab PT Goals Patient Stated Goal: pt unable to state. Sister in law states that wife would like to take pt home but rest of family feels that pt needs to go to rehab first PT Goal Formulation: With patient/family Time For Goal Achievement: 11/08/15 Progress towards PT goals: Progressing toward goals    Frequency  Min 5X/week    PT Plan Current plan remains appropriate    End of Session Equipment Utilized During Treatment: Gait belt;Back brace Activity Tolerance: Patient tolerated treatment well;Patient limited by fatigue Patient left: in chair;with call bell/phone  within reach;with family/visitor present     Time: [id number] PT Time Calculation (min) (ACUTE ONLY): 22 min  Charges:  $Therapeutic  Activity: 8-22 mins           Willow Ora 10/28/2015, 11:52 AM   Willow Ora, PTA, CLT Acute Rehab Services [phone] 10/28/2015, 11:53 AM

## 2015-10-28 NOTE — H&P (Signed)
Physical Medicine and Rehabilitation Admission H&P    CC: Back pain radiating to BLE due to L2/3 stenosis with kyphosis and L4/5 spondylolisthesis.    HPI:   Mitchell (Red)  W [name] is a 81 y.o. male with history of CAD with LVD, A flutter, melanoma, ureteral stricture, obesity, Right foot drop X 10 years, right ankle fracture a month ago, L4/5 decompression, back pain with radiation to BLE and weakness due to HNP L2/3 with severe central canal stenosis and worsening of left foraminal narrowing at L4/5 with kyphosis and spondylosis. He elected to undergo redo decompression with fusion L4/5 adn L5/S1 with L2/3 laminectomy by Dr. Vertell Limber on 10/24/15. Post op has been limited due to recent fall with right ankle fracture, RTC tear/endstage DJD right shoulder,back pain, difficulty processing, decreased standing balance and limited ability to walk.  CIR recommended for follow up therapy.    Review of Systems  Eyes: Negative for blurred vision and double vision.  Respiratory: Positive for shortness of breath. Negative for cough.   Cardiovascular: Negative for chest pain and palpitations.  Gastrointestinal: Positive for heartburn and constipation. Negative for abdominal pain.  Genitourinary: Negative for dysuria.  Musculoskeletal: Positive for myalgias and back pain.       Needs right shoulder replacement  Skin: Negative for itching and rash.  Neurological: Positive for sensory change (decreased RLE), focal weakness (right foot drop--wears AFO) and weakness. Negative for dizziness (had for about 2 weeks PTA) and headaches.  Endo/Heme/Allergies: Positive for environmental allergies.  Psychiatric/Behavioral: Negative for depression. The patient is not nervous/anxious and does not have insomnia.   All other systems reviewed and are negative.     Past Medical History  Diagnosis Date  . Coronary artery disease     2D ECHO, 08/24/2011 - EF >55%; NUCLEAR STRESS TEST, 04/21/2010 - mild ischemia in  Basal Inferolateral and Mid Inferolateral regions, post-stress EF 56%,   . Arthritis   . Melanoma ([place])     l neck  . Adenomatous polyp   . Shortness of breath   . Prostatitis   . Dysrhythmia     atrial fibrilation  . Pneumonia   . Spinal stenosis   . Atrial flutter with rapid ventricular response ([place]) 05/25/2011  . SOB (shortness of breath), secondary to SOB 05/25/2011  . LV dysfunction, EF by ECHO 40-45% 04/29/11 05/25/2011  . Acute on chronic systolic heart failure, usually asymptomatic but with tachycardia HF became acute now resolved 05/26/2011  . Sleep apnea     no longer has since loss of 70 pounds  . Anxiety   . GERD (gastroesophageal reflux disease)     takes Pantoprazole daily  . Constipation     takes Miralax daily as needed  . Depression     takes Cymbalta daily  . Hypertension     takes Amlodipine and Atenolol daily  . Asthma     Symbicort daily as needed  . Hyperlipidemia     takes Zetia daily  . CHF, acute, mild secondary to EF 40-45% an a. flutter with RVR 05/25/2011    takes Furosemide daily as needed  . Ankle fracture     right  . History of blood transfusion 53  yrs ago    got hives    Past Surgical History  Procedure Laterality Date  . Appendectomy    . Hernia repair      umbilical  . Joint replacement      left knee  . Joint replacement  left hip  . Lumbar laminectomy/decompression microdiscectomy    . Back surgery    . Coronary angioplasty    . Tee without cardioversion  05/26/2011    Procedure: TRANSESOPHAGEAL ECHOCARDIOGRAM (TEE);  Surgeon: Sanda Klein, MD;  Location: [facility] ENDOSCOPY;  Service: Cardiovascular;  Laterality: N/A;  . Cardioversion  05/26/2011    Procedure: CARDIOVERSION;  Surgeon: Sanda Klein, MD;  Location: [facility] ENDOSCOPY;  Service: Cardiovascular;  Laterality: N/A;  . Cardiac catheterization  03/18/2000    PDA between the first PLA branch stented with a 3x8 Guidant Penta stent resulting in reduction of a 99% stenosis to 0% residual   . Knee replacements Bilateral   . Rotator cuff repair Left     Family History  Problem Relation Age of Onset  . Heart failure Mother 6  . Heart failure Father 84  . Cancer Paternal Grandfather     Social History:  Married. 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  He reports that he has never smoked. He quit smokeless tobacco use about 43 years ago. His smokeless tobacco use included Chew. He reports that he does not drink alcohol or use illicit drugs.    Allergies  Allergen Reactions  . Statins Other (See Comments)    Leg pain  . Adhesive [Tape] Itching and Rash    Please use "paper" tape    Medications Prior to Admission  Medication Sig Dispense Refill  . Acetaminophen (TYLENOL EXTRA STRENGTH PO) Take 1,000 mg by mouth every 8 (eight) hours as needed (pain).     [name] amLODipine (NORVASC) 5 MG tablet TAKE 1 TABLET BY MOUTH DAILY. 30 tablet 11  . atenolol (TENORMIN) 25 MG tablet TAKE 1 TABLET (25 MG TOTAL) BY MOUTH DAILY. 30 tablet 5  . budesonide-formoterol (SYMBICORT) 160-4.5 MCG/ACT inhaler Inhale 2 puffs into the lungs 2 (two) times daily. (Patient taking differently: Inhale 2 puffs into the lungs 2 (two) times daily as needed. ) 1 Inhaler 3  . calcium carbonate (OS-CAL) 600 MG TABS Take 600 mg by mouth 2 (two) times daily with a meal. Reported on 05/29/2015    . carboxymethylcellulose (REFRESH PLUS) 0.5 % SOLN Place 1 drop into both eyes 3 (three) times daily as needed (dry eyes).    . DULoxetine (CYMBALTA) 30 MG capsule Take 1 capsule (30 mg total) by mouth daily. 30 capsule 1  . furosemide (LASIX) 40 MG tablet Take 0.5 tablets (20 mg total) by mouth every other day. Please make appointment for refills. 15 tablet 1  . meloxicam (MOBIC) 15 MG tablet TAKE 1 TABLET BY MOUTH DAILY. 30 tablet 5  . pantoprazole (PROTONIX) 40 MG tablet TAKE 1 TABLET BY MOUTH DAILY. 30 tablet 3  . polyethylene glycol  (MIRALAX / GLYCOLAX) packet Take 17 g by mouth at bedtime.     . traMADol (ULTRAM) 50 MG tablet TAKE 1 TABLET BY MOUTH EVERY 8 HOURS AS NEEDED. (Patient taking differently: TAKE 1 TABLET BY MOUTH EVERY 8 HOURS AS NEEDED for pain) 90 tablet 5  . ZETIA 10 MG tablet TAKE 1 TABLET BY MOUTH ONCE DAILY. 30 tablet 11  . HYDROcodone-acetaminophen (NORCO/VICODIN) 5-325 MG tablet Take 0.5 tablets by mouth every 8 (eight) hours as needed for severe pain. (Patient not taking: Reported on 10/09/2015) 4 tablet 0  . hydrocortisone cream 1 % Apply 1 application topically daily as needed for itching.       Home: Home Living Family/patient expects  to be discharged to:: Skilled nursing facility Living Arrangements: Spouse/significant other Available Help at Discharge: Family, Available 24 hours/day Type of Home: House Home Access: [place]: One [place]: Electric scooter Additional Comments: wife wants to take husband home but he is over 6 ft tall and >250 lbs and family reports that she has already been having a hard time caring for hom before the surgery   Functional History: Prior Function Level of Independence: Needs assistance Gait / Transfers Assistance Needed: per sister in law, pt needed max A to ambulate so was not really doing it but eather using electric scooter ADL's / Homemaking Assistance Needed: wife was helping with bathing and dressing  Functional Status:  Mobility: Bed Mobility Overal bed mobility: Needs Assistance Bed Mobility: Rolling, Sidelying to Sit Rolling: Mod assist Sidelying to sit: Mod assist, HOB elevated Sit to supine: +2 for physical assistance, Max assist General bed mobility comments: assist needed to bend knees prior to rolling in bed. mod assist for rolling and for sitting up to edge of bed. cues throughout for sequencing and technique. Transfers Overall transfer level: Needs assistance Equipment used: Rolling walker (2  wheeled) Transfers: Sit to/from Stand, Stand Pivot Transfers Sit to Stand: Min assist, +2 physical assistance, From elevated surface Stand pivot transfers: Min assist, +2 safety/equipment General transfer comment: bed elevated to assist wtih standing. cues needed for hand placement and for weight shifting. assist needed to power up into standing. Once standing pt took 4-5 pivot steps with RW from bed to recliner chair. cues on hand placement with sitting down. assist needed due to uncontrolled descent with sitting down.                             Ambulation/Gait General Gait Details: limited to std pvt to chair to L side. pt able to complete with RW with verbal directional cues Gait velocity: slow    ADL:    Cognition: Cognition Overall Cognitive Status: Impaired/Different from baseline Orientation Level: Oriented to person, Oriented to time, Disoriented to place, Oriented to situation Cognition Arousal/Alertness: Awake/alert Behavior During Therapy: WFL for tasks assessed/performed Overall Cognitive Status: Impaired/Different from baseline Area of Impairment: Memory Orientation Level: Disoriented to, Time, Situation, Place Current Attention Level: Focused Memory: Decreased recall of precautions, Decreased short-term memory Following Commands: Follows one step commands consistently Safety/Judgement: Decreased awareness of safety, Decreased awareness of deficits Awareness: Intellectual Problem Solving: Slow processing, Requires verbal cues, Requires tactile cues General Comments: pt confused today, talking about nonsensical things throughout eval   Blood pressure 116/61, pulse 72, temperature 98.6 F (37 C), temperature source Oral, resp. rate 20, height 6\' 3"  (1.905 m), weight 122.471 kg (270 lb), SpO2 93 %. Physical Exam  Vitals reviewed. Constitutional: He appears well-developed and well-nourished.  HENT:  Head: Normocephalic and atraumatic.  Mouth/Throat: Oropharynx is  clear and moist.  Eyes: Conjunctivae are normal. Pupils are equal, round, and reactive to light.  Neck: Normal range of motion. Neck supple.  Cardiovascular: Normal rate and regular rhythm.   Respiratory: Effort normal and breath sounds normal. No stridor. No respiratory distress. He has no wheezes.  GI: Soft. Bowel sounds are normal. He exhibits no distension. There is no tenderness.  Musculoskeletal: He exhibits edema (1+ edema right ankle. ). He exhibits no tenderness.  Neurological: He is alert.  HOH DTRs symmetric A&Ox1 Sensation diminished to light touch b/l feet Motor: B/l UE: 4+/5 grossly proximal to distal  RLE: hip flexion, knee extension 4/5, ankle dorsi/plantarflexion 2+/5 LLE: hip flexion, knee extension 4/5, ankle dorsi/plantar flexion 4+/5  Skin: Skin is warm and dry. No erythema.  Psychiatric: He has a normal mood and affect. Thought content normal.    No results found for this or any previous visit (from the past 48 hour(s)). No results found.   Medical Problem List and Plan: 1.  Gait and cognitive deficits with weakness secondary to lumbar radiculopathy L2/3 and kyphosis with L4/5 spondylolisthesis with hx of right foot drop 2.  DVT Prophylaxis/Anticoagulation: Pharmaceutical: Lovenox added as 5 days post op.  3. Pain Management:    Will schedule ultram qid for now.    Will continue oxycodone prn for now.  4. Mood: LCSW to follow for evaluation and support.  5. Neuropsych: This patient is capable of making decisions on his own behalf. 6. Skin/Wound Care: Monitor wound for healing. Maintain adequate nutrition and hydration status.  7. Fluids/Electrolytes/Nutrition: Monitor I/O. Check lytes in am. 8. HTN: Monitor BP bid. On amlodipine and atenolol.  9. Ureteral stricture: Currently voiding without difficulty  Monitor I/O.  10. Ankle fracture: To wear CAM boot when standing/walking. 11. Depression/Anxiety: On cymbalta 12. GERD: On protonix.  13. Asthma: Encourage  IS. On dulera for hyperactive airway.  14. Constipation: Will increase miralax to bid. 15. CAD: Cont meds 16. Obesity: Body mass index is 33.75 kg/(m^2), diet and exercise education, encourage weight loss to increase endurance and promote overall health 17. Right ankle fracture: Cont boot 18. Chronic back pain: With severe central canal stenosis and foraminal narrowing: Cont meds 19. Right RTC tear and DJD right shoulder: Cont meds 20. Cognitive deficits with difficulty processing: Possibly secondary to narcotics   Post Admission Physician Evaluation: 1. Functional deficits secondary  to to lumbar radiculopathy L2/3 and kyphosis with L4/5 spondylolisthesis with hx of right foot drop. 2. Patient is admitted to receive collaborative, interdisciplinary care between the physiatrist, rehab nursing staff, and therapy team. 3. Patient's level of medical complexity and substantial therapy needs in context of that medical necessity cannot be provided at a lesser intensity of care such as a SNF. 4. Patient has experienced substantial functional loss from his/her baseline which was documented above under the "Functional History" and "Functional Status" headings.  Judging by the patient's diagnosis, physical exam, and functional history, the patient has potential for functional progress which will result in measurable gains while on inpatient rehab.  These gains will be of substantial and practical use upon discharge  in facilitating mobility and self-care at the household level. 5. Physiatrist will provide 24 hour management of medical needs as well as oversight of the therapy plan/treatment and provide guidance as appropriate regarding the interaction of the two. 6. 24 hour rehab nursing will assist with bladder management, safety, skin/wound care, disease management, pain management and patient education and help integrate therapy concepts, techniques,education, etc. 7. PT will assess and treat for/with:  Lower extremity strength, range of motion, stamina, balance, functional mobility, safety, adaptive techniques and equipment, woundcare, coping skills, pain control, education.   Goals are: Min A/Supervision. 8. OT will assess and treat for/with: ADL's, functional mobility, safety, upper extremity strength, adaptive techniques and equipment, wound mgt, ego support, and community reintegration.   Goals are: Min A/Supervision. Therapy may not proceed with showering this patient. 9. Case Management and Social Worker will assess and treat for psychological issues and discharge planning. 10. Team conference will be held weekly to assess progress toward goals and to  determine barriers to discharge. 11. Patient will receive at least 3 hours of therapy per day at least 5 days per week. 12. ELOS: 10-15 days.       13. Prognosis:  excellent   Delice Lesch, MD 10/28/2015

## 2015-10-28 NOTE — Consult Note (Signed)
Physical Medicine and Rehabilitation Consult   Reason for Consult: Lumbar radiculopathy L2/3 and kyphosis with L4/5 spondylolisthesis Referring Physician:  Dr. Vertell Limber.    HPI: Mitchell [name] is a 81 y.o. male with history of CAD with LVD, A flutter, ureteral stricture, obesity, Right foot drop X 10 years, right ankle fracture a month ago, L4/5 decompression, back pain with radiation to BLE and weakness due to  HNP L2/3 with severe central canal stenosis and worsening of left foraminal narrowing at L4/5 with kyphosis and spondylosis.  He elected to undergo redo decompression with fusion L4/5 adn L5/S1 with L2/3 laminectomy by Dr. Vertell Limber on 10/24/15. Post op has been limited due to recent fall with right ankle fracture, RTC tear/endstage DJD right shoulder,  Back pain, cognitive deficits with difficulty processing, decreased standing balance as well as inability to walk. CIR recommended for follow up therapy.    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 Was walking some with walker but fell 6 weeks ago--tripped due to right foot drop/was not wearing his AFO. He was able to tolerate sitting up for an hour yesterday. Wife supportive --family has been assisting for the past few months.    Review of Systems  Constitutional: Positive for malaise/fatigue.  HENT: Positive for hearing loss.   Eyes: Negative for blurred vision and double vision.  Respiratory: Negative for cough and shortness of breath.   Cardiovascular: Negative for chest pain and palpitations.  Gastrointestinal: Positive for constipation (has not workd since Friday). Negative for heartburn, nausea and abdominal pain.  Genitourinary: Positive for urgency. Negative for dysuria and frequency.  Musculoskeletal: Positive for myalgias, back pain and joint pain (needs right shoulder replaced--weakness X 3-4 years. ).  Skin: Negative for itching and  rash.  Neurological: Positive for dizziness (first thing in am) and weakness. Negative for headaches.  Psychiatric/Behavioral: Negative for depression and memory loss. The patient has insomnia (uses Tylenol pm and ultram at nights).   All other systems reviewed and are negative.    Past Medical History  Diagnosis Date  . Coronary artery disease     2D ECHO, 08/24/2011 - EF >55%; NUCLEAR STRESS TEST, 04/21/2010 - mild ischemia in Basal Inferolateral and Mid Inferolateral regions, post-stress EF 56%,   . Arthritis   . Melanoma ([place])     l neck  . Adenomatous polyp   . Shortness of breath   . Prostatitis   . Dysrhythmia     atrial fibrilation  . Pneumonia   . Spinal stenosis   . Atrial flutter with rapid ventricular response ([place]) 05/25/2011  . SOB (shortness of breath), secondary to SOB 05/25/2011  . LV dysfunction, EF by ECHO 40-45% 04/29/11 05/25/2011  . Acute on chronic systolic heart failure, usually asymptomatic but with tachycardia HF became acute now resolved 05/26/2011  . Sleep apnea     no longer has since loss of 70 pounds  . Anxiety   . GERD (gastroesophageal reflux disease)     takes Pantoprazole daily  . Constipation     takes Miralax daily as needed  . Depression     takes Cymbalta daily  . Hypertension     takes Amlodipine and Atenolol daily  . Asthma     Symbicort daily as needed  . Hyperlipidemia     takes Zetia daily  . CHF, acute, mild secondary to EF 40-45% an a.  flutter with RVR 05/25/2011    takes Furosemide daily as needed  . Ankle fracture     right  . History of blood transfusion 53  yrs ago    got hives    Past Surgical History  Procedure Laterality Date  . Appendectomy    . Hernia repair      umbilical  . Joint replacement      left knee  . Joint replacement      left hip  . Lumbar laminectomy/decompression microdiscectomy    . Back surgery    . Coronary angioplasty    . Tee without cardioversion  05/26/2011    Procedure: TRANSESOPHAGEAL  ECHOCARDIOGRAM (TEE);  Surgeon: Sanda Klein, MD;  Location: [facility] ENDOSCOPY;  Service: Cardiovascular;  Laterality: N/A;  . Cardioversion  05/26/2011    Procedure: CARDIOVERSION;  Surgeon: Sanda Klein, MD;  Location: [facility] ENDOSCOPY;  Service: Cardiovascular;  Laterality: N/A;  . Cardiac catheterization  03/18/2000    PDA between the first PLA branch stented with a 3x8 Guidant Penta stent resulting in reduction of a 99% stenosis to 0% residual  . Knee replacements Bilateral   . Rotator cuff repair Left     Family History  Problem Relation Age of Onset  . Heart failure Mother 94  . Heart failure Father 46  . Cancer Paternal Grandfather     Social History:  Married. Self-employed farmer. He  reports that he has never smoked. He quit smokeless tobacco use about 43 years ago. His smokeless tobacco use included Chew. He reports that he does not drink alcohol or use illicit drugs.    Allergies  Allergen Reactions  . Statins Other (See Comments)    Leg pain  . Adhesive [Tape] Itching and Rash    Please use "paper" tape    Medications Prior to Admission  Medication Sig Dispense Refill  . Acetaminophen (TYLENOL EXTRA STRENGTH PO) Take 1,000 mg by mouth every 8 (eight) hours as needed (pain).     [name] amLODipine (NORVASC) 5 MG tablet TAKE 1 TABLET BY MOUTH DAILY. 30 tablet 11  . atenolol (TENORMIN) 25 MG tablet TAKE 1 TABLET (25 MG TOTAL) BY MOUTH DAILY. 30 tablet 5  . budesonide-formoterol (SYMBICORT) 160-4.5 MCG/ACT inhaler Inhale 2 puffs into the lungs 2 (two) times daily. (Patient taking differently: Inhale 2 puffs into the lungs 2 (two) times daily as needed. ) 1 Inhaler 3  . calcium carbonate (OS-CAL) 600 MG TABS Take 600 mg by mouth 2 (two) times daily with a meal. Reported on 05/29/2015    . carboxymethylcellulose (REFRESH PLUS) 0.5 % SOLN Place 1 drop into both eyes 3 (three) times daily as needed (dry eyes).    . DULoxetine (CYMBALTA) 30 MG capsule Take 1 capsule (30 mg total) by mouth  daily. 30 capsule 1  . furosemide (LASIX) 40 MG tablet Take 0.5 tablets (20 mg total) by mouth every other day. Please make appointment for refills. 15 tablet 1  . meloxicam (MOBIC) 15 MG tablet TAKE 1 TABLET BY MOUTH DAILY. 30 tablet 5  . pantoprazole (PROTONIX) 40 MG tablet TAKE 1 TABLET BY MOUTH DAILY. 30 tablet 3  . polyethylene glycol (MIRALAX / GLYCOLAX) packet Take 17 g by mouth at bedtime.     . traMADol (ULTRAM) 50 MG tablet TAKE 1 TABLET BY MOUTH EVERY 8 HOURS AS NEEDED. (Patient taking differently: TAKE 1 TABLET BY MOUTH EVERY 8 HOURS AS NEEDED for pain) 90 tablet 5  . ZETIA 10 MG tablet TAKE 1  TABLET BY MOUTH ONCE DAILY. 30 tablet 11  . HYDROcodone-acetaminophen (NORCO/VICODIN) 5-325 MG tablet Take 0.5 tablets by mouth every 8 (eight) hours as needed for severe pain. (Patient not taking: Reported on 10/09/2015) 4 tablet 0  . hydrocortisone cream 1 % Apply 1 application topically daily as needed for itching.       Home: Home Living Family/patient expects to be discharged to:: Skilled nursing facility Living Arrangements: Spouse/significant other Available Help at Discharge: Family, Available 24 hours/day Type of Home: House Home Access: [place]: One [place]: Electric scooter Additional Comments: wife wants to take husband home but he is over 6 ft tall and >250 lbs and family reports that she has already been having a hard time caring for hom before the surgery  Functional History: Prior Function Level of Independence: Needs assistance Gait / Transfers Assistance Needed: per sister in law, pt needed max A to ambulate so was not really doing it but eather using electric scooter ADL's / Homemaking Assistance Needed: wife was helping with bathing and dressing Functional Status:  Mobility: Bed Mobility Overal bed mobility: Needs Assistance Bed Mobility: Rolling, Sidelying to Sit Rolling: Mod assist, +2 for physical assistance Sidelying to sit: Mod  assist, +2 for physical assistance Sit to supine: +2 for physical assistance, Max assist General bed mobility comments: minA for L LE knee bending, v/c's for rolling, maxA for trunk elevation, modA for LE management off EOB Transfers Overall transfer level: Needs assistance Equipment used: Rolling walker (2 wheeled) Transfers: Sit to/from Stand, [employer] Transfers Sit to Stand: Mod assist, +2 physical assistance, +2 safety/equipment Stand pivot transfers: Min assist, +2 physical assistance General transfer comment: mod/maxA with bed elevated, once pt up and holding onto walker pt minAx2 to maintain standing Ambulation/Gait General Gait Details: limited to std pvt to chair to L side. pt able to complete with RW with verbal directional cues Gait velocity: slow    ADL:    Cognition: Cognition Overall Cognitive Status: Impaired/Different from baseline Orientation Level: Oriented to person, Oriented to time, Disoriented to place, Oriented to situation Cognition Arousal/Alertness: Awake/alert Behavior During Therapy: WFL for tasks assessed/performed Overall Cognitive Status: Impaired/Different from baseline Area of Impairment: Memory Orientation Level: Disoriented to, Time, Situation, Place Current Attention Level: Focused Memory: Decreased short-term memory Following Commands: Follows one step commands consistently Safety/Judgement: Decreased awareness of safety, Decreased awareness of deficits Awareness: Intellectual Problem Solving: Slow processing, Requires verbal cues, Requires tactile cues General Comments: pt confused today, talking about nonsensical things throughout eval   Blood pressure 138/68, pulse 76, temperature 98 F (36.7 C), temperature source Axillary, resp. rate 20, height 6\' 3"  (1.905 m), weight 122.471 kg (270 lb), SpO2 91 %. Physical Exam  Nursing note and vitals reviewed. Constitutional: He is oriented to person, place, and time. He appears well-developed  and well-nourished.  HENT:  Head: Normocephalic and atraumatic.  Mouth/Throat: Oropharynx is clear and moist.  Eyes: Conjunctivae are normal. Pupils are equal, round, and reactive to light.  Neck: Normal range of motion. Neck supple.  Cardiovascular: Normal rate and regular rhythm.   No murmur heard. Respiratory: Effort normal and breath sounds normal. No stridor. No respiratory distress. He has no rales. He exhibits no tenderness.  GI: Soft. Bowel sounds are normal. He exhibits no distension. There is no tenderness.  Musculoskeletal: He exhibits edema. He exhibits no tenderness.  Well healed incision bilateral knees.  1+ edema bilateral ankles.  Right foot drop with decreased sensation.  RLE in  boot  Neurological: He is alert and oriented to person, place, and time.  HOH Pleasant and appropriate.  He was able to follow basic commands without difficulty.   Sensation intact to light touch, except for right foot DTRs symmetric Motor: B/l UE: 4+/5 RLE: hip flexion, knee extension 2/5, ankle dorsi/plantar flexion 2/5 LLE: Hip flexion, knee extension 2/5, ankle dorsi/plantar flexion 3+/5  Skin: Skin is warm and dry. No rash noted. No erythema.  Back drain in place with bloody drainage.  Psychiatric: He has a normal mood and affect. His behavior is normal. Thought content normal.    No results found for this or any previous visit (from the past 24 hour(s)). No results found.  Assessment/Plan: Diagnosis: Lumbar radiculopathy L2/3 and kyphosis with L4/5 spondylolisthesis Labs and images independently reviewed.  Records reviewed and summated above.  1. Does the need for close, 24 hr/day medical supervision in concert with the patient's rehab needs make it unreasonable for this patient to be served in a less intensive setting? Potentially  2. Co-Morbidities requiring supervision/potential complications: CAD (Cont meds), A flutter (monitor HR with increased activity, cont meds), ureteral  stricture, obesity (Body mass index is 33.75 kg/(m^2)., diet and exercise education, encourage weight loss to increase endurance and promote overall health), right foot drop X 10 years, right ankle fracture a month ago (cont boot), back pain with radiation to BLE and weakness due to  HNP L2/3 with severe central canal stenosis and worsening of left foraminal narrowing at L4/5 with kyphosis and spondylosis, RTC tear/endstage, DJD right shoulder,  Chronic low back pain (Biofeedback training with therapies to help reduce reliance on opiate pain medications, monitor pain control during therapies, and sedation at rest and titrate to maximum efficacy to ensure participation and gains in therapies), cognitive deficits with difficulty processing (SLP) 3. Due to bladder management, safety, skin/wound care, disease management, pain management and patient education, does the patient require 24 hr/day rehab nursing? Yes 4. Does the patient require coordinated care of a physician, rehab nurse, PT (1-2 hrs/day, 5 days/week), OT (1-2 hrs/day, 5 days/week) and SLP (1-2 hrs/day, 5 days/week) to address physical and functional deficits in the context of the above medical diagnosis(es)? Yes Addressing deficits in the following areas: balance, endurance, locomotion, strength, transferring, bathing, dressing, toileting and psychosocial support 5. Can the patient actively participate in an intensive therapy program of at least 3 hrs of therapy per day at least 5 days per week? Yes 6. The potential for patient to make measurable gains while on inpatient rehab is good 7. Anticipated functional outcomes upon discharge from inpatient rehab are supervision and min assist  with PT, supervision and min assist with OT, independent and modified independent with SLP. 8. Estimated rehab length of stay to reach the above functional goals is: 8-13 days. 9. Does the patient have adequate social supports and living environment to accommodate  these discharge functional goals? Yes 10. Anticipated D/C setting: Home 11. Anticipated post D/C treatments: HH therapy and Home excercise program 12. Overall Rehab/Functional Prognosis: good  RECOMMENDATIONS: This patient's condition is appropriate for continued rehabilitative care in the following setting: CIR Patient has agreed to participate in recommended program. Yes Note that insurance prior authorization may be required for reimbursement for recommended care.  Comment: Rehab Admissions Coordinator to follow up.  Delice Lesch, MD 10/28/2015

## 2015-10-28 NOTE — Progress Notes (Signed)
Subjective: Patient reports "I feel good"  Objective: Vital signs in last 24 hours: Temp:  [98 F (36.7 C)-99.1 F (37.3 C)] 98 F (36.7 C) (07/11 0544) Pulse Rate:  [67-78] 76 (07/11 0544) Resp:  [18-20] 20 (07/11 0544) BP: (118-138)/(59-71) 138/68 mmHg (07/11 0544) SpO2:  [91 %-95 %] 91 % (07/11 0544)  Intake/Output from previous day: 07/10 0701 - 07/11 0700 In: 600 [P.O.:600] Out: 1350 [Urine:1350] Intake/Output this shift:    Awakens to voice. Reports lumbar discomfort only with log roll for incision inspection. Incision flat, without erythema or drainage. Hemovac with minimal blood. Honeycomb over Dermabond intact. MAEW, good strength BLE (Right dorsiflexion remains weak as pre-op, and limited by Fx). Worked with PT/OT yesterday.   Lab Results: No results for input(s): WBC, HGB, HCT, PLT in the last 72 hours. BMET No results for input(s): NA, K, CL, CO2, GLUCOSE, BUN, CREATININE, CALCIUM in the last 72 hours.  Studies/Results: No results found.  Assessment/Plan: Improving slowly   LOS: 4 days  Per Dr. Ronnald Ramp, d/c hemovac, c/s Inpatient Rehab. Orders entered.    Verdis Prime 10/28/2015, 8:03 AM    Patient seen and examined.  I agree with the above.  Remove Hemovac.  Rehabilitation consult.

## 2015-10-29 ENCOUNTER — Inpatient Hospital Stay (HOSPITAL_COMMUNITY)
Admission: RE | Admit: 2015-10-29 | Discharge: 2015-11-22 | DRG: 949 | Disposition: A | Discharge status: Home Health | Payer: Medicare Other | Source: Intra-hospital | Attending: Physical Medicine & Rehabilitation | Admitting: Physical Medicine & Rehabilitation

## 2015-10-29 DIAGNOSIS — Z85820 Personal history of malignant melanoma of skin: Secondary | ICD-10-CM

## 2015-10-29 DIAGNOSIS — K5900 Constipation, unspecified: Secondary | ICD-10-CM | POA: Diagnosis present

## 2015-10-29 DIAGNOSIS — Z4889 Encounter for other specified surgical aftercare: Principal | ICD-10-CM

## 2015-10-29 DIAGNOSIS — N135 Crossing vessel and stricture of ureter without hydronephrosis: Secondary | ICD-10-CM | POA: Diagnosis present

## 2015-10-29 DIAGNOSIS — F419 Anxiety disorder, unspecified: Secondary | ICD-10-CM | POA: Diagnosis present

## 2015-10-29 DIAGNOSIS — R451 Restlessness and agitation: Secondary | ICD-10-CM | POA: Diagnosis not present

## 2015-10-29 DIAGNOSIS — Z981 Arthrodesis status: Secondary | ICD-10-CM | POA: Diagnosis not present

## 2015-10-29 DIAGNOSIS — Z888 Allergy status to other drugs, medicaments and biological substances status: Secondary | ICD-10-CM

## 2015-10-29 DIAGNOSIS — S82891A Other fracture of right lower leg, initial encounter for closed fracture: Secondary | ICD-10-CM | POA: Diagnosis present

## 2015-10-29 DIAGNOSIS — J45909 Unspecified asthma, uncomplicated: Secondary | ICD-10-CM | POA: Diagnosis present

## 2015-10-29 DIAGNOSIS — J45991 Cough variant asthma: Secondary | ICD-10-CM | POA: Insufficient documentation

## 2015-10-29 DIAGNOSIS — D62 Acute posthemorrhagic anemia: Secondary | ICD-10-CM | POA: Diagnosis not present

## 2015-10-29 DIAGNOSIS — I5022 Chronic systolic (congestive) heart failure: Secondary | ICD-10-CM | POA: Diagnosis present

## 2015-10-29 DIAGNOSIS — S82891D Other fracture of right lower leg, subsequent encounter for closed fracture with routine healing: Secondary | ICD-10-CM

## 2015-10-29 DIAGNOSIS — F05 Delirium due to known physiological condition: Secondary | ICD-10-CM | POA: Diagnosis present

## 2015-10-29 DIAGNOSIS — Z91048 Other nonmedicinal substance allergy status: Secondary | ICD-10-CM | POA: Diagnosis not present

## 2015-10-29 DIAGNOSIS — I10 Essential (primary) hypertension: Secondary | ICD-10-CM

## 2015-10-29 DIAGNOSIS — T8131XA Disruption of external operation (surgical) wound, not elsewhere classified, initial encounter: Secondary | ICD-10-CM | POA: Diagnosis not present

## 2015-10-29 DIAGNOSIS — G8929 Other chronic pain: Secondary | ICD-10-CM | POA: Diagnosis present

## 2015-10-29 DIAGNOSIS — E785 Hyperlipidemia, unspecified: Secondary | ICD-10-CM | POA: Diagnosis present

## 2015-10-29 DIAGNOSIS — T8149XA Infection following a procedure, other surgical site, initial encounter: Secondary | ICD-10-CM | POA: Diagnosis not present

## 2015-10-29 DIAGNOSIS — T148XXA Other injury of unspecified body region, initial encounter: Secondary | ICD-10-CM

## 2015-10-29 DIAGNOSIS — Z79899 Other long term (current) drug therapy: Secondary | ICD-10-CM

## 2015-10-29 DIAGNOSIS — M5116 Intervertebral disc disorders with radiculopathy, lumbar region: Secondary | ICD-10-CM | POA: Diagnosis present

## 2015-10-29 DIAGNOSIS — E871 Hypo-osmolality and hyponatremia: Secondary | ICD-10-CM | POA: Diagnosis not present

## 2015-10-29 DIAGNOSIS — Z8739 Personal history of other diseases of the musculoskeletal system and connective tissue: Secondary | ICD-10-CM

## 2015-10-29 DIAGNOSIS — Z96642 Presence of left artificial hip joint: Secondary | ICD-10-CM | POA: Diagnosis present

## 2015-10-29 DIAGNOSIS — D72829 Elevated white blood cell count, unspecified: Secondary | ICD-10-CM | POA: Clinically undetermined

## 2015-10-29 DIAGNOSIS — I2510 Atherosclerotic heart disease of native coronary artery without angina pectoris: Secondary | ICD-10-CM | POA: Diagnosis present

## 2015-10-29 DIAGNOSIS — R413 Other amnesia: Secondary | ICD-10-CM | POA: Diagnosis not present

## 2015-10-29 DIAGNOSIS — R410 Disorientation, unspecified: Secondary | ICD-10-CM

## 2015-10-29 DIAGNOSIS — T814XXA Infection following a procedure, initial encounter: Secondary | ICD-10-CM | POA: Diagnosis not present

## 2015-10-29 DIAGNOSIS — R0989 Other specified symptoms and signs involving the circulatory and respiratory systems: Secondary | ICD-10-CM

## 2015-10-29 DIAGNOSIS — I110 Hypertensive heart disease with heart failure: Secondary | ICD-10-CM | POA: Diagnosis present

## 2015-10-29 DIAGNOSIS — Z87891 Personal history of nicotine dependence: Secondary | ICD-10-CM

## 2015-10-29 DIAGNOSIS — N3944 Nocturnal enuresis: Secondary | ICD-10-CM | POA: Diagnosis not present

## 2015-10-29 DIAGNOSIS — G479 Sleep disorder, unspecified: Secondary | ICD-10-CM | POA: Diagnosis present

## 2015-10-29 DIAGNOSIS — M19011 Primary osteoarthritis, right shoulder: Secondary | ICD-10-CM | POA: Diagnosis present

## 2015-10-29 DIAGNOSIS — F329 Major depressive disorder, single episode, unspecified: Secondary | ICD-10-CM | POA: Diagnosis present

## 2015-10-29 DIAGNOSIS — F068 Other specified mental disorders due to known physiological condition: Secondary | ICD-10-CM | POA: Diagnosis not present

## 2015-10-29 DIAGNOSIS — R5381 Other malaise: Secondary | ICD-10-CM | POA: Diagnosis not present

## 2015-10-29 DIAGNOSIS — Z7951 Long term (current) use of inhaled steroids: Secondary | ICD-10-CM

## 2015-10-29 DIAGNOSIS — F4323 Adjustment disorder with mixed anxiety and depressed mood: Secondary | ICD-10-CM

## 2015-10-29 DIAGNOSIS — M4806 Spinal stenosis, lumbar region: Secondary | ICD-10-CM | POA: Diagnosis not present

## 2015-10-29 DIAGNOSIS — K219 Gastro-esophageal reflux disease without esophagitis: Secondary | ICD-10-CM | POA: Insufficient documentation

## 2015-10-29 DIAGNOSIS — R4189 Other symptoms and signs involving cognitive functions and awareness: Secondary | ICD-10-CM | POA: Diagnosis not present

## 2015-10-29 DIAGNOSIS — H9190 Unspecified hearing loss, unspecified ear: Secondary | ICD-10-CM | POA: Diagnosis present

## 2015-10-29 DIAGNOSIS — Z9861 Coronary angioplasty status: Secondary | ICD-10-CM

## 2015-10-29 DIAGNOSIS — Z96653 Presence of artificial knee joint, bilateral: Secondary | ICD-10-CM | POA: Diagnosis present

## 2015-10-29 DIAGNOSIS — R32 Unspecified urinary incontinence: Secondary | ICD-10-CM | POA: Diagnosis present

## 2015-10-29 DIAGNOSIS — R269 Unspecified abnormalities of gait and mobility: Secondary | ICD-10-CM | POA: Diagnosis not present

## 2015-10-29 DIAGNOSIS — M549 Dorsalgia, unspecified: Secondary | ICD-10-CM

## 2015-10-29 DIAGNOSIS — Z6833 Body mass index (BMI) 33.0-33.9, adult: Secondary | ICD-10-CM

## 2015-10-29 MED ORDER — MOMETASONE FURO-FORMOTEROL FUM 200-5 MCG/ACT IN AERO
2.0000 | INHALATION_SPRAY | Freq: Two times a day (BID) | RESPIRATORY_TRACT | Status: DC
  Administered 2015-10-30 – 2015-11-22 (×35): 2 via RESPIRATORY_TRACT
  Filled 2015-10-29 (×3): qty 8.8

## 2015-10-29 MED ORDER — BISACODYL 10 MG RE SUPP: 10.0000 mg | Freq: Every day | RECTAL | Status: DC | PRN

## 2015-10-29 MED ORDER — PANTOPRAZOLE SODIUM 40 MG PO TBEC
40.0000 mg | DELAYED_RELEASE_TABLET | Freq: Every day | ORAL | Status: DC
  Administered 2015-10-30 – 2015-11-22 (×24): 40 mg via ORAL
  Filled 2015-10-29 (×24): qty 1

## 2015-10-29 MED ORDER — FAMOTIDINE 20 MG PO TABS
20.0000 mg | ORAL_TABLET | Freq: Two times a day (BID) | ORAL | Status: DC
  Administered 2015-10-29 – 2015-11-06 (×17): 20 mg via ORAL
  Filled 2015-10-29 (×5): qty 1
  Filled 2015-10-29: qty 2
  Filled 2015-10-29 (×11): qty 1

## 2015-10-29 MED ORDER — ENOXAPARIN SODIUM 40 MG/0.4ML ~~LOC~~ SOLN
40.0000 mg | SUBCUTANEOUS | Status: DC
  Administered 2015-10-30 – 2015-11-21 (×23): 40 mg via SUBCUTANEOUS
  Filled 2015-10-29 (×23): qty 0.4

## 2015-10-29 MED ORDER — ALUM & MAG HYDROXIDE-SIMETH 200-200-20 MG/5ML PO SUSP: 30.0000 mL | ORAL | Status: DC | PRN

## 2015-10-29 MED ORDER — OXYCODONE HCL 5 MG PO TABS: 5.0000 mg | ORAL_TABLET | ORAL | Status: DC | PRN

## 2015-10-29 MED ORDER — DULOXETINE HCL 30 MG PO CPEP
30.0000 mg | ORAL_CAPSULE | Freq: Every day | ORAL | Status: DC
  Administered 2015-10-30 – 2015-11-06 (×8): 30 mg via ORAL
  Filled 2015-10-29 (×8): qty 1

## 2015-10-29 MED ORDER — PROCHLORPERAZINE 25 MG RE SUPP
12.5000 mg | Freq: Four times a day (QID) | RECTAL | Status: DC | PRN
  Filled 2015-10-29: qty 1

## 2015-10-29 MED ORDER — DIPHENHYDRAMINE HCL 12.5 MG/5ML PO ELIX: 12.5000 mg | ORAL_SOLUTION | Freq: Four times a day (QID) | ORAL | Status: DC | PRN

## 2015-10-29 MED ORDER — POLYVINYL ALCOHOL 1.4 % OP SOLN: 1.0000 [drp] | Freq: Three times a day (TID) | OPHTHALMIC | Status: DC | PRN

## 2015-10-29 MED ORDER — FLEET ENEMA 7-19 GM/118ML RE ENEM: 1.0000 | ENEMA | Freq: Every day | RECTAL | Status: DC | PRN

## 2015-10-29 MED ORDER — DOCUSATE SODIUM 100 MG PO CAPS
100.0000 mg | ORAL_CAPSULE | Freq: Two times a day (BID) | ORAL | Status: DC
  Administered 2015-10-29 – 2015-11-22 (×42): 100 mg via ORAL
  Filled 2015-10-29 (×46): qty 1

## 2015-10-29 MED ORDER — TRAMADOL HCL 50 MG PO TABS
50.0000 mg | ORAL_TABLET | Freq: Three times a day (TID) | ORAL | Status: DC
  Administered 2015-10-29 – 2015-11-04 (×24): 50 mg via ORAL
  Filled 2015-10-29 (×25): qty 1

## 2015-10-29 MED ORDER — HYDROCORTISONE 1 % EX CREA
1.0000 "application " | TOPICAL_CREAM | Freq: Two times a day (BID) | CUTANEOUS | Status: DC
  Administered 2015-10-30 – 2015-11-03 (×2): 1 via TOPICAL
  Filled 2015-10-29: qty 28

## 2015-10-29 MED ORDER — FUROSEMIDE 20 MG PO TABS
20.0000 mg | ORAL_TABLET | ORAL | Status: DC
  Administered 2015-10-31 – 2015-11-20 (×11): 20 mg via ORAL
  Filled 2015-10-29 (×11): qty 1

## 2015-10-29 MED ORDER — TRAZODONE HCL 50 MG PO TABS
25.0000 mg | ORAL_TABLET | Freq: Every evening | ORAL | Status: DC | PRN
  Administered 2015-11-02 (×2): 25 mg via ORAL
  Administered 2015-11-04: 50 mg via ORAL
  Filled 2015-10-29 (×3): qty 1

## 2015-10-29 MED ORDER — CALCIUM CARBONATE 1250 (500 CA) MG PO TABS
1250.0000 mg | ORAL_TABLET | Freq: Two times a day (BID) | ORAL | Status: DC
  Administered 2015-10-30 – 2015-11-22 (×47): 1250 mg via ORAL
  Filled 2015-10-29 (×47): qty 1

## 2015-10-29 MED ORDER — PROCHLORPERAZINE EDISYLATE 5 MG/ML IJ SOLN: 5.0000 mg | Freq: Four times a day (QID) | INTRAMUSCULAR | Status: DC | PRN

## 2015-10-29 MED ORDER — PHENOL 1.4 % MT LIQD: 1.0000 | OROMUCOSAL | Status: DC | PRN

## 2015-10-29 MED ORDER — PROCHLORPERAZINE MALEATE 5 MG PO TABS: 5.0000 mg | ORAL_TABLET | Freq: Four times a day (QID) | ORAL | Status: DC | PRN

## 2015-10-29 MED ORDER — GUAIFENESIN-DM 100-10 MG/5ML PO SYRP: 5.0000 mL | ORAL_SOLUTION | Freq: Four times a day (QID) | ORAL | Status: DC | PRN

## 2015-10-29 MED ORDER — ATENOLOL 25 MG PO TABS
25.0000 mg | ORAL_TABLET | Freq: Every day | ORAL | Status: DC
  Administered 2015-10-30 – 2015-11-03 (×4): 25 mg via ORAL
  Filled 2015-10-29 (×5): qty 1

## 2015-10-29 MED ORDER — ONDANSETRON HCL 4 MG/2ML IJ SOLN: 4.0000 mg | INTRAMUSCULAR | Status: DC | PRN

## 2015-10-29 MED ORDER — MENTHOL 3 MG MT LOZG
1.0000 | LOZENGE | OROMUCOSAL | Status: DC | PRN
  Filled 2015-10-29: qty 9

## 2015-10-29 MED ORDER — AMLODIPINE BESYLATE 5 MG PO TABS
5.0000 mg | ORAL_TABLET | Freq: Every day | ORAL | Status: DC
  Administered 2015-10-30 – 2015-11-19 (×18): 5 mg via ORAL
  Filled 2015-10-29 (×23): qty 1

## 2015-10-29 MED ORDER — POLYETHYLENE GLYCOL 3350 17 G PO PACK
17.0000 g | PACK | Freq: Two times a day (BID) | ORAL | Status: DC
  Administered 2015-10-29 – 2015-11-15 (×31): 17 g via ORAL
  Filled 2015-10-29 (×34): qty 1

## 2015-10-29 MED ORDER — METHOCARBAMOL 500 MG PO TABS
500.0000 mg | ORAL_TABLET | Freq: Four times a day (QID) | ORAL | Status: DC | PRN
  Administered 2015-11-06 – 2015-11-17 (×6): 500 mg via ORAL
  Filled 2015-10-29 (×9): qty 1

## 2015-10-29 MED ORDER — BISACODYL 10 MG RE SUPP
10.0000 mg | Freq: Every day | RECTAL | Status: DC | PRN
  Administered 2015-11-22: 10 mg via RECTAL
  Filled 2015-10-29: qty 1

## 2015-10-29 MED ORDER — EZETIMIBE 10 MG PO TABS
10.0000 mg | ORAL_TABLET | Freq: Every day | ORAL | Status: DC
  Administered 2015-10-30 – 2015-11-22 (×24): 10 mg via ORAL
  Filled 2015-10-29 (×24): qty 1

## 2015-10-29 NOTE — Progress Notes (Signed)
Pt to CIR with family, RN and NT discharged pt in bed.

## 2015-10-29 NOTE — Interval H&P Note (Signed)
Mitchell [name] was admitted today to Inpatient Rehabilitation with the diagnosis of to lumbar radiculopathy L2/3 and kyphosis with L4/5 spondylolisthesis with hx of right foot drop.  The patient's history has been reviewed, patient examined, and there is no change in status.  Patient continues to be appropriate for intensive inpatient rehabilitation.  I have reviewed the patient's chart and labs.  Questions were answered to the patient's satisfaction. The PAPE has been reviewed and assessment remains appropriate.  Mitchell [name] Mitchell [name] 10/29/2015, 9:29 PM

## 2015-10-29 NOTE — H&P (View-Only) (Signed)
Physical Medicine and Rehabilitation Admission H&P    CC: Back pain radiating to BLE due to L2/3 stenosis with kyphosis and L4/5 spondylolisthesis.    HPI:   Mitchell (Red)  W [name] is a 81 y.o. male with history of CAD with LVD, A flutter, melanoma, ureteral stricture, obesity, Right foot drop X 10 years, right ankle fracture a month ago, L4/5 decompression, back pain with radiation to BLE and weakness due to HNP L2/3 with severe central canal stenosis and worsening of left foraminal narrowing at L4/5 with kyphosis and spondylosis. He elected to undergo redo decompression with fusion L4/5 adn L5/S1 with L2/3 laminectomy by Dr. Vertell Limber on 10/24/15. Post op has been limited due to recent fall with right ankle fracture, RTC tear/endstage DJD right shoulder,back pain, difficulty processing, decreased standing balance and limited ability to walk.  CIR recommended for follow up therapy.    Review of Systems  Eyes: Negative for blurred vision and double vision.  Respiratory: Positive for shortness of breath. Negative for cough.   Cardiovascular: Negative for chest pain and palpitations.  Gastrointestinal: Positive for heartburn and constipation. Negative for abdominal pain.  Genitourinary: Negative for dysuria.  Musculoskeletal: Positive for myalgias and back pain.       Needs right shoulder replacement  Skin: Negative for itching and rash.  Neurological: Positive for sensory change (decreased RLE), focal weakness (right foot drop--wears AFO) and weakness. Negative for dizziness (had for about 2 weeks PTA) and headaches.  Endo/Heme/Allergies: Positive for environmental allergies.  Psychiatric/Behavioral: Negative for depression. The patient is not nervous/anxious and does not have insomnia.   All other systems reviewed and are negative.     Past Medical History  Diagnosis Date  . Coronary artery disease     2D ECHO, 08/24/2011 - EF >55%; NUCLEAR STRESS TEST, 04/21/2010 - mild ischemia in  Basal Inferolateral and Mid Inferolateral regions, post-stress EF 56%,   . Arthritis   . Melanoma ([place])     l neck  . Adenomatous polyp   . Shortness of breath   . Prostatitis   . Dysrhythmia     atrial fibrilation  . Pneumonia   . Spinal stenosis   . Atrial flutter with rapid ventricular response ([place]) 05/25/2011  . SOB (shortness of breath), secondary to SOB 05/25/2011  . LV dysfunction, EF by ECHO 40-45% 04/29/11 05/25/2011  . Acute on chronic systolic heart failure, usually asymptomatic but with tachycardia HF became acute now resolved 05/26/2011  . Sleep apnea     no longer has since loss of 70 pounds  . Anxiety   . GERD (gastroesophageal reflux disease)     takes Pantoprazole daily  . Constipation     takes Miralax daily as needed  . Depression     takes Cymbalta daily  . Hypertension     takes Amlodipine and Atenolol daily  . Asthma     Symbicort daily as needed  . Hyperlipidemia     takes Zetia daily  . CHF, acute, mild secondary to EF 40-45% an a. flutter with RVR 05/25/2011    takes Furosemide daily as needed  . Ankle fracture     right  . History of blood transfusion 53  yrs ago    got hives    Past Surgical History  Procedure Laterality Date  . Appendectomy    . Hernia repair      umbilical  . Joint replacement      left knee  . Joint replacement  left hip  . Lumbar laminectomy/decompression microdiscectomy    . Back surgery    . Coronary angioplasty    . Tee without cardioversion  05/26/2011    Procedure: TRANSESOPHAGEAL ECHOCARDIOGRAM (TEE);  Surgeon: Sanda Klein, MD;  Location: [facility] ENDOSCOPY;  Service: Cardiovascular;  Laterality: N/A;  . Cardioversion  05/26/2011    Procedure: CARDIOVERSION;  Surgeon: Sanda Klein, MD;  Location: [facility] ENDOSCOPY;  Service: Cardiovascular;  Laterality: N/A;  . Cardiac catheterization  03/18/2000    PDA between the first PLA branch stented with a 3x8 Guidant Penta stent resulting in reduction of a 99% stenosis to 0% residual   . Knee replacements Bilateral   . Rotator cuff repair Left     Family History  Problem Relation Age of Onset  . Heart failure Mother 21  . Heart failure Father 20  . Cancer Paternal Grandfather     Social History:  Married. 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  He reports that he has never smoked. He quit smokeless tobacco use about 43 years ago. His smokeless tobacco use included Chew. He reports that he does not drink alcohol or use illicit drugs.    Allergies  Allergen Reactions  . Statins Other (See Comments)    Leg pain  . Adhesive [Tape] Itching and Rash    Please use "paper" tape    Medications Prior to Admission  Medication Sig Dispense Refill  . Acetaminophen (TYLENOL EXTRA STRENGTH PO) Take 1,000 mg by mouth every 8 (eight) hours as needed (pain).     [name] amLODipine (NORVASC) 5 MG tablet TAKE 1 TABLET BY MOUTH DAILY. 30 tablet 11  . atenolol (TENORMIN) 25 MG tablet TAKE 1 TABLET (25 MG TOTAL) BY MOUTH DAILY. 30 tablet 5  . budesonide-formoterol (SYMBICORT) 160-4.5 MCG/ACT inhaler Inhale 2 puffs into the lungs 2 (two) times daily. (Patient taking differently: Inhale 2 puffs into the lungs 2 (two) times daily as needed. ) 1 Inhaler 3  . calcium carbonate (OS-CAL) 600 MG TABS Take 600 mg by mouth 2 (two) times daily with a meal. Reported on 05/29/2015    . carboxymethylcellulose (REFRESH PLUS) 0.5 % SOLN Place 1 drop into both eyes 3 (three) times daily as needed (dry eyes).    . DULoxetine (CYMBALTA) 30 MG capsule Take 1 capsule (30 mg total) by mouth daily. 30 capsule 1  . furosemide (LASIX) 40 MG tablet Take 0.5 tablets (20 mg total) by mouth every other day. Please make appointment for refills. 15 tablet 1  . meloxicam (MOBIC) 15 MG tablet TAKE 1 TABLET BY MOUTH DAILY. 30 tablet 5  . pantoprazole (PROTONIX) 40 MG tablet TAKE 1 TABLET BY MOUTH DAILY. 30 tablet 3  . polyethylene glycol  (MIRALAX / GLYCOLAX) packet Take 17 g by mouth at bedtime.     . traMADol (ULTRAM) 50 MG tablet TAKE 1 TABLET BY MOUTH EVERY 8 HOURS AS NEEDED. (Patient taking differently: TAKE 1 TABLET BY MOUTH EVERY 8 HOURS AS NEEDED for pain) 90 tablet 5  . ZETIA 10 MG tablet TAKE 1 TABLET BY MOUTH ONCE DAILY. 30 tablet 11  . HYDROcodone-acetaminophen (NORCO/VICODIN) 5-325 MG tablet Take 0.5 tablets by mouth every 8 (eight) hours as needed for severe pain. (Patient not taking: Reported on 10/09/2015) 4 tablet 0  . hydrocortisone cream 1 % Apply 1 application topically daily as needed for itching.       Home: Home Living Family/patient expects  to be discharged to:: Skilled nursing facility Living Arrangements: Spouse/significant other Available Help at Discharge: Family, Available 24 hours/day Type of Home: House Home Access: Albert: One Roann: Electric scooter Additional Comments: wife wants to take husband home but he is over 6 ft tall and >250 lbs and family reports that she has already been having a hard time caring for hom before the surgery   Functional History: Prior Function Level of Independence: Needs assistance Gait / Transfers Assistance Needed: per sister in law, pt needed max A to ambulate so was not really doing it but eather using electric scooter ADL's / Homemaking Assistance Needed: wife was helping with bathing and dressing  Functional Status:  Mobility: Bed Mobility Overal bed mobility: Needs Assistance Bed Mobility: Rolling, Sidelying to Sit Rolling: Mod assist Sidelying to sit: Mod assist, HOB elevated Sit to supine: +2 for physical assistance, Max assist General bed mobility comments: assist needed to bend knees prior to rolling in bed. mod assist for rolling and for sitting up to edge of bed. cues throughout for sequencing and technique. Transfers Overall transfer level: Needs assistance Equipment used: Rolling walker (2  wheeled) Transfers: Sit to/from Stand, Stand Pivot Transfers Sit to Stand: Min assist, +2 physical assistance, From elevated surface Stand pivot transfers: Min assist, +2 safety/equipment General transfer comment: bed elevated to assist wtih standing. cues needed for hand placement and for weight shifting. assist needed to power up into standing. Once standing pt took 4-5 pivot steps with RW from bed to recliner chair. cues on hand placement with sitting down. assist needed due to uncontrolled descent with sitting down.                             Ambulation/Gait General Gait Details: limited to std pvt to chair to L side. pt able to complete with RW with verbal directional cues Gait velocity: slow    ADL:    Cognition: Cognition Overall Cognitive Status: Impaired/Different from baseline Orientation Level: Oriented to person, Oriented to time, Disoriented to place, Oriented to situation Cognition Arousal/Alertness: Awake/alert Behavior During Therapy: WFL for tasks assessed/performed Overall Cognitive Status: Impaired/Different from baseline Area of Impairment: Memory Orientation Level: Disoriented to, Time, Situation, Place Current Attention Level: Focused Memory: Decreased recall of precautions, Decreased short-term memory Following Commands: Follows one step commands consistently Safety/Judgement: Decreased awareness of safety, Decreased awareness of deficits Awareness: Intellectual Problem Solving: Slow processing, Requires verbal cues, Requires tactile cues General Comments: pt confused today, talking about nonsensical things throughout eval   Blood pressure 116/61, pulse 72, temperature 98.6 F (37 C), temperature source Oral, resp. rate 20, height 6\' 3"  (1.905 m), weight 122.471 kg (270 lb), SpO2 93 %. Physical Exam  Vitals reviewed. Constitutional: He appears well-developed and well-nourished.  HENT:  Head: Normocephalic and atraumatic.  Mouth/Throat: Oropharynx is  clear and moist.  Eyes: Conjunctivae are normal. Pupils are equal, round, and reactive to light.  Neck: Normal range of motion. Neck supple.  Cardiovascular: Normal rate and regular rhythm.   Respiratory: Effort normal and breath sounds normal. No stridor. No respiratory distress. He has no wheezes.  GI: Soft. Bowel sounds are normal. He exhibits no distension. There is no tenderness.  Musculoskeletal: He exhibits edema (1+ edema right ankle. ). He exhibits no tenderness.  Neurological: He is alert.  HOH DTRs symmetric A&Ox1 Sensation diminished to light touch b/l feet Motor: B/l UE: 4+/5 grossly proximal to distal  RLE: hip flexion, knee extension 4/5, ankle dorsi/plantarflexion 2+/5 LLE: hip flexion, knee extension 4/5, ankle dorsi/plantar flexion 4+/5  Skin: Skin is warm and dry. No erythema.  Psychiatric: He has a normal mood and affect. Thought content normal.    No results found for this or any previous visit (from the past 48 hour(s)). No results found.   Medical Problem List and Plan: 1.  Gait and cognitive deficits with weakness secondary to lumbar radiculopathy L2/3 and kyphosis with L4/5 spondylolisthesis with hx of right foot drop 2.  DVT Prophylaxis/Anticoagulation: Pharmaceutical: Lovenox added as 5 days post op.  3. Pain Management:    Will schedule ultram qid for now.    Will continue oxycodone prn for now.  4. Mood: LCSW to follow for evaluation and support.  5. Neuropsych: This patient is capable of making decisions on his own behalf. 6. Skin/Wound Care: Monitor wound for healing. Maintain adequate nutrition and hydration status.  7. Fluids/Electrolytes/Nutrition: Monitor I/O. Check lytes in am. 8. HTN: Monitor BP bid. On amlodipine and atenolol.  9. Ureteral stricture: Currently voiding without difficulty  Monitor I/O.  10. Ankle fracture: To wear CAM boot when standing/walking. 11. Depression/Anxiety: On cymbalta 12. GERD: On protonix.  13. Asthma: Encourage  IS. On dulera for hyperactive airway.  14. Constipation: Will increase miralax to bid. 15. CAD: Cont meds 16. Obesity: Body mass index is 33.75 kg/(m^2), diet and exercise education, encourage weight loss to increase endurance and promote overall health 17. Right ankle fracture: Cont boot 18. Chronic back pain: With severe central canal stenosis and foraminal narrowing: Cont meds 19. Right RTC tear and DJD right shoulder: Cont meds 20. Cognitive deficits with difficulty processing: Possibly secondary to narcotics   Post Admission Physician Evaluation: 1. Functional deficits secondary  to to lumbar radiculopathy L2/3 and kyphosis with L4/5 spondylolisthesis with hx of right foot drop. 2. Patient is admitted to receive collaborative, interdisciplinary care between the physiatrist, rehab nursing staff, and therapy team. 3. Patient's level of medical complexity and substantial therapy needs in context of that medical necessity cannot be provided at a lesser intensity of care such as a SNF. 4. Patient has experienced substantial functional loss from his/her baseline which was documented above under the "Functional History" and "Functional Status" headings.  Judging by the patient's diagnosis, physical exam, and functional history, the patient has potential for functional progress which will result in measurable gains while on inpatient rehab.  These gains will be of substantial and practical use upon discharge  in facilitating mobility and self-care at the household level. 5. Physiatrist will provide 24 hour management of medical needs as well as oversight of the therapy plan/treatment and provide guidance as appropriate regarding the interaction of the two. 6. 24 hour rehab nursing will assist with bladder management, safety, skin/wound care, disease management, pain management and patient education and help integrate therapy concepts, techniques,education, etc. 7. PT will assess and treat for/with:  Lower extremity strength, range of motion, stamina, balance, functional mobility, safety, adaptive techniques and equipment, woundcare, coping skills, pain control, education.   Goals are: Min A/Supervision. 8. OT will assess and treat for/with: ADL's, functional mobility, safety, upper extremity strength, adaptive techniques and equipment, wound mgt, ego support, and community reintegration.   Goals are: Min A/Supervision. Therapy may not proceed with showering this patient. 9. Case Management and Social Worker will assess and treat for psychological issues and discharge planning. 10. Team conference will be held weekly to assess progress toward goals and to  determine barriers to discharge. 11. Patient will receive at least 3 hours of therapy per day at least 5 days per week. 12. ELOS: 10-15 days.       13. Prognosis:  excellent   Delice Lesch, MD 10/28/2015

## 2015-10-29 NOTE — Progress Notes (Signed)
Ankit [name], MD Physician Signed Physical Medicine and Rehabilitation Consult Note 10/28/2015 8:43 AM  Related encounter: Admission (Current) from 10/24/2015 in [place] All Collapse All        Physical Medicine and Rehabilitation Consult   Reason for Consult: Lumbar radiculopathy L2/3 and kyphosis with L4/5 spondylolisthesis Referring Physician: Dr. Vertell Limber.    HPI: Mitchell [name] is a 81 y.o. male with history of CAD with LVD, A flutter, ureteral stricture, obesity, Right foot drop X 10 years, right ankle fracture a month ago, L4/5 decompression, back pain with radiation to BLE and weakness due to HNP L2/3 with severe central canal stenosis and worsening of left foraminal narrowing at L4/5 with kyphosis and spondylosis. He elected to undergo redo decompression with fusion L4/5 adn L5/S1 with L2/3 laminectomy by Dr. Vertell Limber on 10/24/15. Post op has been limited due to recent fall with right ankle fracture, RTC tear/endstage DJD right shoulder, Back pain, cognitive deficits with difficulty processing, decreased standing balance as well as inability to walk. CIR recommended for follow up therapy.   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 Was walking some with walker but fell 6 weeks ago--tripped due to right foot drop/was not wearing his AFO. He was able to tolerate sitting up for an hour yesterday. Wife supportive --family has been assisting for the past few months.   Review of Systems  Constitutional: Positive for malaise/fatigue.  HENT: Positive for hearing loss.  Eyes: Negative for blurred vision and double vision.  Respiratory: Negative for cough and shortness of breath.  Cardiovascular: Negative for chest pain and palpitations.  Gastrointestinal: Positive for constipation (has not workd since Friday). Negative for heartburn, nausea  and abdominal pain.  Genitourinary: Positive for urgency. Negative for dysuria and frequency.  Musculoskeletal: Positive for myalgias, back pain and joint pain (needs right shoulder replaced--weakness X 3-4 years. ).  Skin: Negative for itching and rash.  Neurological: Positive for dizziness (first thing in am) and weakness. Negative for headaches.  Psychiatric/Behavioral: Negative for depression and memory loss. The patient has insomnia (uses Tylenol pm and ultram at nights).  All other systems reviewed and are negative.    Past Medical History  Diagnosis Date  . Coronary artery disease     2D ECHO, 08/24/2011 - EF >55%; NUCLEAR STRESS TEST, 04/21/2010 - mild ischemia in Basal Inferolateral and Mid Inferolateral regions, post-stress EF 56%,   . Arthritis   . Melanoma ([place])     l neck  . Adenomatous polyp   . Shortness of breath   . Prostatitis   . Dysrhythmia     atrial fibrilation  . Pneumonia   . Spinal stenosis   . Atrial flutter with rapid ventricular response ([place]) 05/25/2011  . SOB (shortness of breath), secondary to SOB 05/25/2011  . LV dysfunction, EF by ECHO 40-45% 04/29/11 05/25/2011  . Acute on chronic systolic heart failure, usually asymptomatic but with tachycardia HF became acute now resolved 05/26/2011  . Sleep apnea     no longer has since loss of 70 pounds  . Anxiety   . GERD (gastroesophageal reflux disease)     takes Pantoprazole daily  . Constipation     takes Miralax daily as needed  . Depression     takes Cymbalta daily  . Hypertension     takes Amlodipine and Atenolol daily  .  Asthma     Symbicort daily as needed  . Hyperlipidemia     takes Zetia daily  . CHF, acute, mild secondary to EF 40-45% an a. flutter with RVR 05/25/2011    takes Furosemide daily as needed  . Ankle fracture     right  . History of blood transfusion 53 yrs ago    got hives     Past Surgical History  Procedure Laterality Date  . Appendectomy    . Hernia repair      umbilical  . Joint replacement      left knee  . Joint replacement      left hip  . Lumbar laminectomy/decompression microdiscectomy    . Back surgery    . Coronary angioplasty    . Tee without cardioversion  05/26/2011    Procedure: TRANSESOPHAGEAL ECHOCARDIOGRAM (TEE); Surgeon: Sanda Klein, MD; Location: [facility] ENDOSCOPY; Service: Cardiovascular; Laterality: N/A;  . Cardioversion  05/26/2011    Procedure: CARDIOVERSION; Surgeon: Sanda Klein, MD; Location: [facility] ENDOSCOPY; Service: Cardiovascular; Laterality: N/A;  . Cardiac catheterization  03/18/2000    PDA between the first PLA branch stented with a 3x8 Guidant Penta stent resulting in reduction of a 99% stenosis to 0% residual  . Knee replacements Bilateral   . Rotator cuff repair Left     Family History  Problem Relation Age of Onset  . Heart failure Mother 7  . Heart failure Father 58  . Cancer Paternal Grandfather     Social History: Married. Self-employed farmer. He reports that he has never smoked. He quit smokeless tobacco use about 43 years ago. His smokeless tobacco use included Chew. He reports that he does not drink alcohol or use illicit drugs.    Allergies  Allergen Reactions  . Statins Other (See Comments)    Leg pain  . Adhesive [Tape] Itching and Rash    Please use "paper" tape    Medications Prior to Admission  Medication Sig Dispense Refill  . Acetaminophen (TYLENOL EXTRA STRENGTH PO) Take 1,000 mg by mouth every 8 (eight) hours as needed (pain).     [name] amLODipine (NORVASC) 5 MG tablet TAKE 1 TABLET BY MOUTH DAILY. 30 tablet 11  . atenolol (TENORMIN) 25 MG tablet TAKE 1 TABLET (25 MG TOTAL) BY MOUTH DAILY. 30 tablet 5  . budesonide-formoterol (SYMBICORT) 160-4.5 MCG/ACT  inhaler Inhale 2 puffs into the lungs 2 (two) times daily. (Patient taking differently: Inhale 2 puffs into the lungs 2 (two) times daily as needed. ) 1 Inhaler 3  . calcium carbonate (OS-CAL) 600 MG TABS Take 600 mg by mouth 2 (two) times daily with a meal. Reported on 05/29/2015    . carboxymethylcellulose (REFRESH PLUS) 0.5 % SOLN Place 1 drop into both eyes 3 (three) times daily as needed (dry eyes).    . DULoxetine (CYMBALTA) 30 MG capsule Take 1 capsule (30 mg total) by mouth daily. 30 capsule 1  . furosemide (LASIX) 40 MG tablet Take 0.5 tablets (20 mg total) by mouth every other day. Please make appointment for refills. 15 tablet 1  . meloxicam (MOBIC) 15 MG tablet TAKE 1 TABLET BY MOUTH DAILY. 30 tablet 5  . pantoprazole (PROTONIX) 40 MG tablet TAKE 1 TABLET BY MOUTH DAILY. 30 tablet 3  . polyethylene glycol (MIRALAX / GLYCOLAX) packet Take 17 g by mouth at bedtime.     . traMADol (ULTRAM) 50 MG tablet TAKE 1 TABLET BY MOUTH EVERY 8 HOURS AS NEEDED. (Patient taking differently: TAKE 1 TABLET BY  MOUTH EVERY 8 HOURS AS NEEDED for pain) 90 tablet 5  . ZETIA 10 MG tablet TAKE 1 TABLET BY MOUTH ONCE DAILY. 30 tablet 11  . HYDROcodone-acetaminophen (NORCO/VICODIN) 5-325 MG tablet Take 0.5 tablets by mouth every 8 (eight) hours as needed for severe pain. (Patient not taking: Reported on 10/09/2015) 4 tablet 0  . hydrocortisone cream 1 % Apply 1 application topically daily as needed for itching.       Home: Home Living Family/patient expects to be discharged to:: Skilled nursing facility Living Arrangements: Spouse/significant other Available Help at Discharge: Family, Available 24 hours/day Type of Home: House Home Access: [place]: [address] [place]: Electric scooter Additional Comments: wife wants to take husband home but he is over 6 ft tall and >250 lbs and family reports that she has already been having a  hard time caring for hom before the surgery  Functional History: Prior Function Level of Independence: Needs assistance Gait / Transfers Assistance Needed: per sister in law, pt needed max A to ambulate so was not really doing it but eather using electric scooter ADL's / Homemaking Assistance Needed: wife was helping with bathing and dressing Functional Status:  Mobility: Bed Mobility Overal bed mobility: Needs Assistance Bed Mobility: Rolling, Sidelying to Sit Rolling: Mod assist, +2 for physical assistance Sidelying to sit: Mod assist, +2 for physical assistance Sit to supine: +2 for physical assistance, Max assist General bed mobility comments: minA for L LE knee bending, v/c's for rolling, maxA for trunk elevation, modA for LE management off EOB Transfers Overall transfer level: Needs assistance Equipment used: Rolling walker (2 wheeled) Transfers: Sit to/from Stand, [employer] Transfers Sit to Stand: Mod assist, +2 physical assistance, +2 safety/equipment Stand pivot transfers: Min assist, +2 physical assistance General transfer comment: mod/maxA with bed elevated, once pt up and holding onto walker pt minAx2 to maintain standing Ambulation/Gait General Gait Details: limited to std pvt to chair to L side. pt able to complete with RW with verbal directional cues Gait velocity: slow    ADL:    Cognition: Cognition Overall Cognitive Status: Impaired/Different from baseline Orientation Level: Oriented to person, Oriented to time, Disoriented to place, Oriented to situation Cognition Arousal/Alertness: Awake/alert Behavior During Therapy: WFL for tasks assessed/performed Overall Cognitive Status: Impaired/Different from baseline Area of Impairment: Memory Orientation Level: Disoriented to, Time, Situation, Place Current Attention Level: Focused Memory: Decreased short-term memory Following Commands: Follows one step commands consistently Safety/Judgement: Decreased  awareness of safety, Decreased awareness of deficits Awareness: Intellectual Problem Solving: Slow processing, Requires verbal cues, Requires tactile cues General Comments: pt confused today, talking about nonsensical things throughout eval   Blood pressure 138/68, pulse 76, temperature 98 F (36.7 C), temperature source Axillary, resp. rate 20, height 6\' 3"  (1.905 m), weight 122.471 kg (270 lb), SpO2 91 %. Physical Exam  Nursing note and vitals reviewed. Constitutional: He is oriented to person, place, and time. He appears well-developed and well-nourished.  HENT:  Head: Normocephalic and atraumatic.  Mouth/Throat: Oropharynx is clear and moist.  Eyes: Conjunctivae are normal. Pupils are equal, round, and reactive to light.  Neck: Normal range of motion. Neck supple.  Cardiovascular: Normal rate and regular rhythm.  No murmur heard. Respiratory: Effort normal and breath sounds normal. No stridor. No respiratory distress. He has no rales. He exhibits no tenderness.  GI: Soft. Bowel sounds are normal. He exhibits no distension. There is no tenderness.  Musculoskeletal: He exhibits edema. He exhibits no tenderness.  Well healed  incision bilateral knees.  1+ edema bilateral ankles.  Right foot drop with decreased sensation.  RLE in boot  Neurological: He is alert and oriented to person, place, and time.  HOH Pleasant and appropriate.  He was able to follow basic commands without difficulty.  Sensation intact to light touch, except for right foot DTRs symmetric Motor: B/l UE: 4+/5 RLE: hip flexion, knee extension 2/5, ankle dorsi/plantar flexion 2/5 LLE: Hip flexion, knee extension 2/5, ankle dorsi/plantar flexion 3+/5  Skin: Skin is warm and dry. No rash noted. No erythema.  Back drain in place with bloody drainage.  Psychiatric: He has a normal mood and affect. His behavior is normal. Thought content normal.     Lab Results Last 24 Hours    No results found for this or any  previous visit (from the past 24 hour(s)).    Imaging Results (Last 48 hours)    No results found.    Assessment/Plan: Diagnosis: Lumbar radiculopathy L2/3 and kyphosis with L4/5 spondylolisthesis Labs and images independently reviewed. Records reviewed and summated above.  1. Does the need for close, 24 hr/day medical supervision in concert with the patient's rehab needs make it unreasonable for this patient to be served in a less intensive setting? Potentially  2. Co-Morbidities requiring supervision/potential complications: CAD (Cont meds), A flutter (monitor HR with increased activity, cont meds), ureteral stricture, obesity (Body mass index is 33.75 kg/(m^2)., diet and exercise education, encourage weight loss to increase endurance and promote overall health), right foot drop X 10 years, right ankle fracture a month ago (cont boot), back pain with radiation to BLE and weakness due to HNP L2/3 with severe central canal stenosis and worsening of left foraminal narrowing at L4/5 with kyphosis and spondylosis, RTC tear/endstage, DJD right shoulder, Chronic low back pain (Biofeedback training with therapies to help reduce reliance on opiate pain medications, monitor pain control during therapies, and sedation at rest and titrate to maximum efficacy to ensure participation and gains in therapies), cognitive deficits with difficulty processing (SLP) 3. Due to bladder management, safety, skin/wound care, disease management, pain management and patient education, does the patient require 24 hr/day rehab nursing? Yes 4. Does the patient require coordinated care of a physician, rehab nurse, PT (1-2 hrs/day, 5 days/week), OT (1-2 hrs/day, 5 days/week) and SLP (1-2 hrs/day, 5 days/week) to address physical and functional deficits in the context of the above medical diagnosis(es)? Yes Addressing deficits in the following areas: balance, endurance, locomotion, strength, transferring, bathing, dressing,  toileting and psychosocial support 5. Can the patient actively participate in an intensive therapy program of at least 3 hrs of therapy per day at least 5 days per week? Yes 6. The potential for patient to make measurable gains while on inpatient rehab is good 7. Anticipated functional outcomes upon discharge from inpatient rehab are supervision and min assist with PT, supervision and min assist with OT, independent and modified independent with SLP. 8. Estimated rehab length of stay to reach the above functional goals is: 8-13 days. 9. Does the patient have adequate social supports and living environment to accommodate these discharge functional goals? Yes 10. Anticipated D/C setting: Home 11. Anticipated post D/C treatments: HH therapy and Home excercise program 12. Overall Rehab/Functional Prognosis: good  RECOMMENDATIONS: This patient's condition is appropriate for continued rehabilitative care in the following setting: CIR Patient has agreed to participate in recommended program. Yes Note that insurance prior authorization may be required for reimbursement for recommended care.  Comment: Rehab Admissions Coordinator to  follow up.  Delice Lesch, MD 10/28/2015       Revision History     Date/Time User Provider Type Action   10/28/2015 1:49 PM Ankit [name], MD Physician Sign   10/28/2015 1:35 PM Ankit [name], MD Physician Share   10/28/2015 1:34 PM Ankit [name], MD Physician Share   10/28/2015 9:51 AM Bary Leriche, PA-C Physician Assistant Share   View Details Report       Routing History     Date/Time From To Method   10/28/2015 1:49 PM Ankit [name], MD Elby Showers, MD In Basket

## 2015-10-29 NOTE — Progress Notes (Signed)
Pt pulled his condom cath off, he was very alert this morning and this afternoon he seems a little more confused.

## 2015-10-29 NOTE — Discharge Summary (Signed)
Physician Discharge Summary  Patient ID: Mitchell [name] MRN: [id number] DOB/AGE: 17-Nov-1934 81 y.o.  Admit date: 10/24/2015 Discharge date: 10/29/2015  Admission Diagnoses: Lumbar stenosis and Herniated Nucleus Pulposus with radiculopathy, kyphosis, spondylosis L 2 - S 1 levels   Discharge Diagnoses: Lumbar stenosis and Herniated Nucleus Pulposus with radiculopathy, kyphosis, spondylosis L 2 - S 1 levels s/p L4-5 L5-S1 Redo Decompression/Fusion/L2-3 Laminectomy with Right Microdiskectomy, L2-S1 Instrumentation (N/A) - L4-5 L5-S1 Redo Decompression/Fusion/L2-3 Laminectomy with Right Microdiskectomy, L2-S1 Instrumentation with posterolateral arthrodesis  Active Problems:   Lumbar stenosis with neurogenic claudication   Status post laminectomy with spinal fusion   Surgery, other elective   Coronary artery disease involving native coronary artery of native heart without angina pectoris   Atrial flutter (HCC)   Obesity   Ureteral stricture   Right foot drop   Ankle fracture, right   Osteoarthritis of spine with radiculopathy, lumbosacral region   Rotator cuff tear   Primary osteoarthritis of right shoulder   Post-operative pain   Cognitive deficits   Discharged Condition: good  Hospital Course: [name] was admitted for surgery with dx HNP, stenosis, and radiculopathy.  Following uncomplicated lumbar decompression and fusion L2-S1 levels, he recovered and transferred to [facility] for nursing care and therapies. Progress has been slow, but steady; complicated by right ankle fx 1 month ago. Uses boot with some difficulty.   Consults: rehabilitation medicine  Significant Diagnostic Studies: radiology: x-ray: intra-op  Treatments: surgery: L4-5 L5-S1 Redo Decompression/Fusion/L2-3 Laminectomy with Right Microdiskectomy, L2-S1 Instrumentation (N/A) - L4-5 L5-S1 Redo Decompression/Fusion/L2-3 Laminectomy with Right Microdiskectomy, L2-S1 Instrumentation with posterolateral  arthrodesis   Discharge Exam: Blood pressure 125/73, pulse 79, temperature 99.2 F (37.3 C), temperature source Oral, resp. rate 20, height 6\' 3"  (1.905 m), weight 122.471 kg (270 lb), SpO2 98 %. Awakens to voice. Wife present. No pain compaint this morning. Oriented to person & place. Wife notes few episodes of confusion last 36hrs, when tired or just waking up. Incision healing nicely without erythema, swelling, or drainage. Strength is good BLE (Rt ankle fx limits ROM). Appetite is good. No BM yet, but pt is agreeable to Fleets enema.    Disposition: Discharge to [facility] per Dr. Ronnald Ramp. Office f/u in 3-4 weeks with Dr. Vertell Limber. Ok to shower. Ok to leave incision open to air, cleansing with shower.      Medication List    ASK your doctor about these medications        amLODipine 5 MG tablet  Commonly known as:  NORVASC  TAKE 1 TABLET BY MOUTH DAILY.     atenolol 25 MG tablet  Commonly known as:  TENORMIN  TAKE 1 TABLET (25 MG TOTAL) BY MOUTH DAILY.     budesonide-formoterol 160-4.5 MCG/ACT inhaler  Commonly known as:  SYMBICORT  Inhale 2 puffs into the lungs 2 (two) times daily.     calcium carbonate 600 MG Tabs tablet  Commonly known as:  OS-CAL  Take 600 mg by mouth 2 (two) times daily with a meal. Reported on 05/29/2015     carboxymethylcellulose 0.5 % Soln  Commonly known as:  REFRESH PLUS  Place 1 drop into both eyes 3 (three) times daily as needed (dry eyes).     DULoxetine 30 MG capsule  Commonly known as:  CYMBALTA  Take 1 capsule (30 mg total) by mouth daily.     furosemide 40 MG tablet  Commonly known as:  LASIX  Take 0.5 tablets (20 mg total) by mouth every  other day. Please make appointment for refills.     HYDROcodone-acetaminophen 5-325 MG tablet  Commonly known as:  NORCO/VICODIN  Take 0.5 tablets by mouth every 8 (eight) hours as needed for severe pain.     hydrocortisone cream 1 %  Apply 1 application topically daily as needed for itching.      meloxicam 15 MG tablet  Commonly known as:  MOBIC  TAKE 1 TABLET BY MOUTH DAILY.     pantoprazole 40 MG tablet  Commonly known as:  PROTONIX  TAKE 1 TABLET BY MOUTH DAILY.     polyethylene glycol packet  Commonly known as:  MIRALAX / GLYCOLAX  Take 17 g by mouth at bedtime.     traMADol 50 MG tablet  Commonly known as:  ULTRAM  TAKE 1 TABLET BY MOUTH EVERY 8 HOURS AS NEEDED.     TYLENOL EXTRA STRENGTH PO  Take 1,000 mg by mouth every 8 (eight) hours as needed (pain).     ZETIA 10 MG tablet  Generic drug:  ezetimibe  TAKE 1 TABLET BY MOUTH ONCE DAILY.         Signed: Verdis Prime 10/29/2015, 11:14 AM

## 2015-10-29 NOTE — Progress Notes (Signed)
Subjective: Patient reports "I don't really hurt much at all"  Objective: Vital signs in last 24 hours: Temp:  [97.3 F (36.3 C)-98.9 F (37.2 C)] 98.9 F (37.2 C) (07/12 0530) Pulse Rate:  [66-78] 75 (07/12 0530) Resp:  [18-20] 18 (07/12 0530) BP: (113-131)/(59-62) 128/60 mmHg (07/12 0530) SpO2:  [93 %-98 %] 96 % (07/12 0530)  Intake/Output from previous day: 07/11 0701 - 07/12 0700 In: -  Out: 1400 [Urine:1400] Intake/Output this shift:    Awakens to voice. Wife present. No pain compaint this morning. Oriented to person & place. Wife notes few episodes of confusion last 36hrs, when tired or just waking up. Incision healing nicely without erythema, swelling, or drainage. Strength is good BLE (Rt ankle fx limits ROM). Appetite is good. No BM yet, but pt is agreeable to Fleets enema.   Lab Results: No results for input(s): WBC, HGB, HCT, PLT in the last 72 hours. BMET No results for input(s): NA, K, CL, CO2, GLUCOSE, BUN, CREATININE, CALCIUM in the last 72 hours.  Studies/Results: No results found.  Assessment/Plan: Improving   LOS: 5 days  Hopeful of CIR. Honeycomb removed; ok to cleanse incision with showers/baths, leave dry & open to air.    Verdis Prime 10/29/2015, 7:56 AM

## 2015-10-29 NOTE — Care Management Note (Signed)
Case Management Note  Patient Details  Name: Mitchell [name] MRN: [id number] Date of Birth: 1935/04/07  Subjective/Objective:                    Action/Plan: Pt discharging to CIR today. No further needs per CM.   Expected Discharge Date:                  Expected Discharge Plan:  [name]  In-House Referral:     Discharge planning Services  CM Consult  Post Acute Care Choice:    Choice offered to:     DME Arranged:    DME Agency:     HH Arranged:    [place] Agency:     Status of Service:  Completed, signed off  If discussed at [employer] of Stay Meetings, dates discussed:    Additional Comments:  Pollie Friar, RN 10/29/2015, 12:00 PM

## 2015-10-29 NOTE — Progress Notes (Signed)
Gunnar Fusi Rehab Admission Coordinator Signed Physical Medicine and Rehabilitation PMR Pre-admission 10/29/2015 12:05 PM  Related encounter: Admission (Current) from 10/24/2015 in [facility]   PMR Admission Coordinator Pre-Admission Assessment  Patient: Mitchell [name] is an 81 y.o., male MRN: [id number] DOB: 1934-06-02 Height: _0  (190.5 cm) Weight: 122.471 kg (270 lb)  Insurance Information HMO: PPO: PCP: IPA: 80/20: OTHER:  PRIMARY: Medicare A and B Policy#: [id number] Subscriber: self CM Name: Phone#: Fax#:  Pre-Cert#: Employer: retired  Benefits: Phone #: Name:  Eff. Date: 08/17/2001 Deduct: $1,316 Out of Pocket Max: none Life Max: none CIR: 100% after yearly deductible is met SNF: 100% days 1-20, 80% days 21-100 Outpatient: 80% Co-Pay: 20% Home Health: 100% Co-Pay: none DME: 80% Co-Pay: 20% Providers: patient's choice  SECONDARY: BCBS Policy#: [id number] Subscriber: self CM Name: Phone#: [phone] Fax#:  Pre-Cert#: Employer: retired Benefits: Phone #: Name:  Eff. Date: Deduct: Out of Pocket Max: Life Max:  CIR: SNF:  Outpatient: Co-Pay:  Home Health: Co-Pay:  DME: Co-Pay:   Medicaid Application Date: Case Manager:  Disability Application Date: Case Worker:   Emergency Contact Information Contact Information    Name Relation Home Work Mobile   Mitchell [name] Spouse [phone]  [phone]   Mitchell [name] Daughter   [phone]     Current Medical History  Patient Admitting Diagnosis: Lumbar radiculopathy L2/3 and kyphosis with L4/5  spondylolisthesis History of Present Illness: Mitchell [name] is a 81 y.o. male with history of CAD with LVD, A flutter, melanoma, ureteral stricture, obesity, Right foot drop X 10 years, right ankle fracture a month ago, L4/5 decompression, back pain with radiation to BLE and weakness due to HNP L2/3 with severe central canal stenosis and worsening of left foraminal narrowing at L4/5 with kyphosis and spondylosis. He elected to undergo redo decompression with fusion L4/5 adn L5/S1 with L2/3 laminectomy by Dr. Vertell Limber on 10/24/15. Post op has been limited due to recent fall with right ankle fracture, RTC tear/endstage DJD right shoulder,back pain, difficulty processing, decreased standing balance and limited ability to walk. CIR recommended for follow up therapy.       Past Medical History  Past Medical History  Diagnosis Date  . Coronary artery disease     2D ECHO, 08/24/2011 - EF >55%; NUCLEAR STRESS TEST, 04/21/2010 - mild ischemia in Basal Inferolateral and Mid Inferolateral regions, post-stress EF 56%,   . Arthritis   . Melanoma ([place])     l neck  . Adenomatous polyp   . Shortness of breath   . Prostatitis   . Dysrhythmia     atrial fibrilation  . Pneumonia   . Spinal stenosis   . Atrial flutter with rapid ventricular response ([place]) 05/25/2011  . SOB (shortness of breath), secondary to SOB 05/25/2011  . LV dysfunction, EF by ECHO 40-45% 04/29/11 05/25/2011  . Acute on chronic systolic heart failure, usually asymptomatic but with tachycardia HF became acute now resolved 05/26/2011  . Sleep apnea     no longer has since loss of 70 pounds  . Anxiety   . GERD (gastroesophageal reflux disease)     takes Pantoprazole daily  . Constipation     takes Miralax daily as needed  . Depression     takes Cymbalta daily  . Hypertension     takes Amlodipine and Atenolol daily  . Asthma     Symbicort daily as  needed  . Hyperlipidemia  takes Zetia daily  . CHF, acute, mild secondary to EF 40-45% an a. flutter with RVR 05/25/2011    takes Furosemide daily as needed  . Ankle fracture     right  . History of blood transfusion 53 yrs ago    got hives    Family History  family history includes Cancer in his paternal grandfather; Heart failure (age of onset: 71) in his father; Heart failure (age of onset: 98) in his mother.  Prior Rehab/Hospitalizations:  Has the patient had major surgery during 100 days prior to admission? No  Current Medications   Current facility-administered medications:  . 0.9 % sodium chloride infusion, 250 mL, Intravenous, Continuous, Erline Levine, MD . acetaminophen (TYLENOL) tablet 650 mg, 650 mg, Oral, Q4H PRN, 650 mg at 10/29/15 0303 **OR** acetaminophen (TYLENOL) suppository 650 mg, 650 mg, Rectal, Q4H PRN, Erline Levine, MD . alum & mag hydroxide-simeth (MAALOX/MYLANTA) 200-200-20 MG/5ML suspension 30 mL, 30 mL, Oral, Q6H PRN, Erline Levine, MD . amLODipine (NORVASC) tablet 5 mg, 5 mg, Oral, Daily, Erline Levine, MD, 5 mg at 10/29/15 0933 . atenolol (TENORMIN) tablet 25 mg, 25 mg, Oral, Daily, Erline Levine, MD, 25 mg at 10/29/15 0933 . bisacodyl (DULCOLAX) suppository 10 mg, 10 mg, Rectal, Daily PRN, Erline Levine, MD . calcium carbonate (OS-CAL - dosed in mg of elemental calcium) tablet 1,250 mg, 1,250 mg, Oral, BID WC, Erline Levine, MD, 1,250 mg at 10/29/15 0820 . dextrose 5 % and 0.45 % NaCl with KCl 20 mEq/L infusion, , Intravenous, Continuous, Erline Levine, MD, Last Rate: 75 mL/hr at 10/25/15 1100 . docusate sodium (COLACE) capsule 100 mg, 100 mg, Oral, BID, Erline Levine, MD, 100 mg at 10/29/15 0933 . DULoxetine (CYMBALTA) DR capsule 30 mg, 30 mg, Oral, Daily, Erline Levine, MD, 30 mg at 10/29/15 0933 . ezetimibe (ZETIA) tablet 10 mg, 10 mg, Oral, Daily, Erline Levine, MD, 10 mg at 10/29/15 1028 . famotidine (PEPCID) tablet 20  mg, 20 mg, Oral, BID, Erline Levine, MD, 20 mg at 10/29/15 0933 . furosemide (LASIX) tablet 20 mg, 20 mg, Oral, QODAY, Erline Levine, MD, 20 mg at 10/29/15 0933 . HYDROcodone-acetaminophen (NORCO/VICODIN) 5-325 MG per tablet 1-2 tablet, 1-2 tablet, Oral, Q4H PRN, Erline Levine, MD, 1 tablet at 10/25/15 2059 . hydrocortisone cream 1 % 1 application, 1 application, Topical, BID, Erline Levine, MD, 1 application at 40/81/44 1000 . HYDROmorphone (DILAUDID) injection 0.5-1 mg, 0.5-1 mg, Intravenous, Q2H PRN, Erline Levine, MD, 1 mg at 10/26/15 0015 . menthol-cetylpyridinium (CEPACOL) lozenge 3 mg, 1 lozenge, Oral, PRN **OR** phenol (CHLORASEPTIC) mouth spray 1 spray, 1 spray, Mouth/Throat, PRN, Erline Levine, MD . methocarbamol (ROBAXIN) tablet 500 mg, 500 mg, Oral, Q6H PRN, 500 mg at 10/29/15 0303 **OR** methocarbamol (ROBAXIN) 500 mg in dextrose 5 % 50 mL IVPB, 500 mg, Intravenous, Q6H PRN, Erline Levine, MD . mometasone-formoterol [facility]) 200-5 MCG/ACT inhaler 2 puff, 2 puff, Inhalation, BID, Erline Levine, MD, 2 puff at 10/29/15 0805 . ondansetron (ZOFRAN) injection 4 mg, 4 mg, Intravenous, Q4H PRN, Erline Levine, MD . oxyCODONE-acetaminophen (PERCOCET/ROXICET) 5-325 MG per tablet 1-2 tablet, 1-2 tablet, Oral, Q4H PRN, Erline Levine, MD, 2 tablet at 10/28/15 1138 . pantoprazole (PROTONIX) EC tablet 40 mg, 40 mg, Oral, Daily, Erline Levine, MD, 40 mg at 10/29/15 0934 . polyethylene glycol (MIRALAX / GLYCOLAX) packet 17 g, 17 g, Oral, QHS, Erline Levine, MD, 17 g at 10/28/15 2312 . polyvinyl alcohol (LIQUIFILM TEARS) 1.4 % ophthalmic solution 1 drop, 1 drop, Both Eyes, TID PRN, Erline Levine,  MD . senna-docusate (Senokot-S) tablet 1 tablet, 1 tablet, Oral, QHS PRN, Erline Levine, MD, 1 tablet at 10/28/15 2313 . sodium chloride flush (NS) 0.9 % injection 3 mL, 3 mL, Intravenous, Q12H, Erline Levine, MD, 3 mL at 10/29/15 1000 . sodium chloride flush (NS) 0.9 % injection 3 mL, 3 mL, Intravenous, PRN,  Erline Levine, MD . zolpidem (AMBIEN) tablet 5 mg, 5 mg, Oral, QHS PRN, Erline Levine, MD  Patients Current Diet: Diet Heart Room service appropriate?: Yes; Fluid consistency:: Thin  Precautions / Restrictions Precautions Precautions: Fall, Back Precaution Booklet Issued: No Precaution Comments: reviewed all back precautions as pt unable to recall any of them Spinal Brace: Lumbar corset, Applied in sitting position (pt able to provide min assist with donning brace) Restrictions Weight Bearing Restrictions: No Other Position/Activity Restrictions: CAM boot for RLE due to ankle fx, shoe for left foot   Has the patient had 2 or more falls or a fall with injury in the past year?Yes fell about a month ago, which resulted in a right ankle injury   Prior Activity Level Limited Community (1-2x/wk): Prior to admission patient had functionally declined due to back pain and a recent fall with injury to his ankle. He was only leaving the house a couple times a week and required assist from wife for safe completion of all basic ADLs.    Home Assistive Devices / Equipment Home Assistive Devices/Equipment: Bedside commode/3-in-1, Hand-held shower hose, Long-handled shoehorn, Long-handled sponge, Reacher, Sock aid, Splint (specify type), Walker (specify type) Home Equipment: Electric scooter  Prior Device Use: Indicate devices/aids used by the patient prior to current illness, exacerbation or injury? Motorized wheelchair or scooter and Radio producer Level Prior Function Level of Independence: Needs assistance Gait / Transfers Assistance Needed: per sister in law, pt needed max A to ambulate so was not really doing it but Games developer ADL's / Homemaking Assistance Needed: wife was helping with bathing and dressing  Self Care: Did the patient need help bathing, dressing, using the toilet or eating? Needed some help  Indoor Mobility: Did the patient need assistance with  walking from room to room (with or without device)? Needed some help  Stairs: Did the patient need assistance with internal or external stairs (with or without device)? Dependent  Functional Cognition: Did the patient need help planning regular tasks such as shopping or remembering to take medications? Needed some help  Current Functional Level Cognition  Overall Cognitive Status: Impaired/Different from baseline Current Attention Level: Focused Orientation Level: Oriented to person, Oriented to time, Disoriented to place, Oriented to situation Following Commands: Follows one step commands consistently Safety/Judgement: Decreased awareness of safety, Decreased awareness of deficits General Comments: pt confused today, talking about nonsensical things throughout eval   Extremity Assessment (includes Sensation/Coordination)  Upper Extremity Assessment: Defer to OT evaluation  Lower Extremity Assessment: Generalized weakness, RLE deficits/detail, LLE deficits/detail RLE Deficits / Details: hip flex 2/5, knee flex/ ext 2/5, ankle NT due to fx RLE Sensation: history of peripheral neuropathy LLE Deficits / Details: hip flex 2/5, knee flex/ ext 2/5 LLE Sensation: history of peripheral neuropathy    ADLs       Mobility  Overal bed mobility: Needs Assistance Bed Mobility: Rolling, Sidelying to Sit Rolling: Mod assist Sidelying to sit: Mod assist, HOB elevated Sit to supine: +2 for physical assistance, Max assist General bed mobility comments: assist needed to bend knees prior to rolling in bed. mod assist for rolling and for sitting up  to edge of bed. cues throughout for sequencing and technique.    Transfers  Overall transfer level: Needs assistance Equipment used: Rolling walker (2 wheeled) Transfers: Sit to/from Stand, Stand Pivot Transfers Sit to Stand: Min assist, +2 physical assistance, From elevated surface Stand pivot transfers: Min assist, +2  safety/equipment General transfer comment: bed elevated to assist wtih standing. cues needed for hand placement and for weight shifting. assist needed to power up into standing. Once standing pt took 4-5 pivot steps with RW from bed to recliner chair. cues on hand placement with sitting down. assist needed due to uncontrolled descent with sitting down.     Ambulation / Gait / Stairs / Wheelchair Mobility  Ambulation/Gait General Gait Details: limited to std pvt to chair to L side. pt able to complete with RW with verbal directional cues Gait velocity: slow    Posture / Balance Dynamic Sitting Balance Sitting balance - Comments: requires bilat UE to off weight back and maintain balance Balance Overall balance assessment: Needs assistance Sitting-balance support: Bilateral upper extremity supported Sitting balance-Leahy Scale: Poor Sitting balance - Comments: requires bilat UE to off weight back and maintain balance Standing balance support: Bilateral upper extremity supported Standing balance-Leahy Scale: Poor    Special needs/care consideration BiPAP/CPAP: No  CPM: No Continuous Drip IV: No Dialysis: No  Life Vest: No Oxygen: No Special Bed: No Trach Size: No Wound Vac (area): No  Skin: WDL  Bowel mgmt: 10/24/15 Bladder mgmt: continent with use of urinal and condom cath Diabetic mgmt: N/A     Previous Home Environment Living Arrangements: Spouse/significant other Available Help at Discharge: Family, Available 24 hours/day Type of Home: House Home Layout: One level Home Access: Ramped entrance Home Care Services: No Additional Comments: wife wants to take husband home but he is over 6 ft tall and >250 lbs and family reports that she has already been having a hard time caring for hom before the surgery  Discharge Living Setting Plans for Discharge Living Setting: Patient's home Type of Home at  Discharge: House Discharge Home Layout: One level Discharge Home Access: Stairs to enter, Ramped entrance (ramped garage steps ) Entrance Stairs-Number of Steps: 3 Discharge Bathroom Shower/Tub: Walk-in shower (with threshold) Discharge Bathroom Toilet: Handicapped height Discharge Bathroom Accessibility: Yes How Accessible: Accessible via walker (half opens walker and utilizes it sideways wife can demo) Does the patient have any problems obtaining your medications?: No  Social/Family/Support Systems Patient Roles: Spouse, Partner Anticipated Caregiver: spouse Anticipated Caregiver's Contact Information: Nickolas [place] cell:[phone] Ability/Limitations of Caregiver: None Caregiver Availability: 24/7 Discharge Plan Discussed with Primary Caregiver: Yes Is Caregiver In Agreement with Plan?: Yes Does Caregiver/Family have Issues with Lodging/Transportation while Pt is in Rehab?: No   Goals/Additional Needs Patient/Family Goal for Rehab: PT/OT Supervision-Min assist  Expected length of stay: 8-13 days Cultural Considerations: None Dietary Needs: Heart Healthy now, none PTA Equipment Needs: TBD Special Service Needs: None Additional Information: Pt with history of urethra strictures  Pt/Family Agrees to Admission and willing to participate: Yes Program Orientation Provided & Reviewed with Pt/Caregiver Including Roles & Responsibilities: Yes Additional Information Needs: None Information Needs to be Provided By: N/A   Decrease burden of Care through IP rehab admission: No   Possible need for SNF placement upon discharge: No   Patient Condition: This patient's condition remains as documented in the consult dated 10/28/15, in which the Rehabilitation Physician determined and documented that the patient's condition is appropriate for intensive rehabilitative care in an inpatient rehabilitation facility.  Will admit to inpatient rehab today.  Preadmission Screen Completed By:  Gunnar Fusi, 10/29/2015 1:50 PM ______________________________________________________________________  Discussed status with Dr. Posey Pronto on 10/29/15 at 1400 and received telephone approval for admission today.  Admission Coordinator: Gunnar Fusi, time 1400/Date 10/29/15          Cosigned by: Ankit [name], MD at 10/29/2015 2:02 PM  Revision History     Date/Time User Provider Type Action   10/29/2015 2:02 PM Ankit [name], MD Physician Cosign   10/29/2015 1:58 PM Gunnar Fusi Rehab Admission Coordinator Sign

## 2015-10-29 NOTE — Progress Notes (Signed)
Physical Therapy Treatment Patient Details Name: Mitchell [name] MRN: [id number] DOB: 08-16-1934 Today's Date: 10/29/2015    History of Present Illness Pt presents with progressive pain and weakness bilateral LE's, L>R though with longstanding R foot drop. Underwent PLIF L2-S1. PMH: lumbar diskectomies multiple levels, CHF, HTN, apnea. Broke R ankle about a month ago and has been wearing CAM boot    PT Comments    Pt continues to need reminding about back precautions, however family members are aware of this deficit and are working with him. He tolerated the addition of seated long arc quads today. Pt required mod verbal and manual tactile cueing, and +2 physical assistance for bed mobility. He was able to ambulate 10 ft with mod assist +2 physical assistance with RW. Pt states he was limited by leg weakness rather than back pain. Continue acute rehab follow.   Follow Up Recommendations  Supervision/Assistance - 24 hour;CIR     Equipment Recommendations  None recommended by PT (TBD by next venue)    Recommendations for Other Services       Precautions / Restrictions Precautions Precautions: Fall;Back Precaution Comments: Precautions were reviewed with patient as he was unable to recall 0/3 Required Braces or Orthoses: Spinal Brace Spinal Brace: Lumbar corset;Applied in sitting position (Pt only able to provide min assist for donning brace.) Restrictions Weight Bearing Restrictions: No Other Position/Activity Restrictions: CAM boot for RLE due to ankle fx, shoe for left foot    Mobility  Bed Mobility Overal bed mobility: Needs Assistance Bed Mobility: Rolling;Sidelying to Sit Rolling: Mod assist Sidelying to sit: Mod assist;+2 for physical assistance       General bed mobility comments: Verbal cues for pt to bend knees, mod manual assistance for holding knees bent and rolling. +2 physical assistance for sidelying to sit, and +1 to get sitting balance.  Transfers Overall  transfer level: Needs assistance Equipment used: Rolling walker (2 wheeled) Transfers: Sit to/from Stand Sit to Stand: +2 physical assistance;From elevated surface;Mod assist         General transfer comment: Cues for hand placement when powering up and reaching for RW.   Ambulation/Gait Ambulation/Gait assistance: Mod assist;+2 physical assistance Ambulation Distance (Feet): 10 Feet Assistive device: Rolling walker (2 wheeled) Gait Pattern/deviations: Step-to pattern;Decreased stride length   Gait velocity interpretation: Below normal speed for age/gender General Gait Details: Pt demonstrated difficulty with R foot clearance and placement due to CAM brace. Pt stated that he was limited more by leg weakness than back pain   Stairs            Wheelchair Mobility    Modified Rankin (Stroke Patients Only)       Balance Overall balance assessment: Needs assistance Sitting-balance support: Feet supported;Bilateral upper extremity supported Sitting balance-Leahy Scale: Fair     Standing balance support: Bilateral upper extremity supported Standing balance-Leahy Scale: Poor                      Cognition Arousal/Alertness: Awake/alert Behavior During Therapy: WFL for tasks assessed/performed Overall Cognitive Status: Impaired/Different from baseline Area of Impairment: Memory     Memory: Decreased recall of precautions;Decreased short-term memory Following Commands: Follows one step commands consistently     Problem Solving: Slow processing;Requires verbal cues      Exercises General Exercises - Lower Extremity Long Arc Quad: Both;10 reps;Seated;AROM    General Comments        Pertinent Vitals/Pain Pain Assessment: Faces Faces Pain Scale: Hurts whole lot  Pain Location: back Pain Descriptors / Indicators: Operative site guarding;Discomfort Pain Intervention(s): Limited activity within patient's tolerance    Home Living                       Prior Function            PT Goals (current goals can now be found in the care plan section) Acute Rehab PT Goals PT Goal Formulation: With patient/family Time For Goal Achievement: 11/08/15 Progress towards PT goals: Progressing toward goals    Frequency  Min 5X/week    PT Plan Current plan remains appropriate    Co-evaluation             End of Session Equipment Utilized During Treatment: Gait belt Activity Tolerance: Patient tolerated treatment well;Patient limited by fatigue;Patient limited by pain Patient left: in chair;with call bell/phone within reach;with family/visitor present     Time: 1424-1450 PT Time Calculation (min) (ACUTE ONLY): 26 min  Charges:  $Gait Training: 8-22 mins $Therapeutic Exercise: 8-22 mins                    G Codes:      Daymon Larsen Nov 21, 2015, 4:13 PM  Tawni Millers, SPT (student physical therapist) [place] [phone]

## 2015-10-29 NOTE — Progress Notes (Signed)
Inpatient Rehabilitation  Met with patient and wife at bedside to discuss team's recommendation for IP Rehab.  Shared booklets and answered questions.  Patient and spouse agreeable to IP Rehab admission, have medical clearance, insurance coverage, and a bed available for today.  Discussed with PA, RN CM, and CSW.  Plan to admit later today.  Please call with questions.   Carmelia Roller., CCC/SLP Admission Coordinator  [place]  Cell [phone]

## 2015-10-29 NOTE — PMR Pre-admission (Signed)
PMR Admission Coordinator Pre-Admission Assessment  Patient: Mitchell [name] is an 81 y.o., male MRN: [id number] DOB: Jan 19, 1935 Height: '6\' 3"'  (190.5 cm) Weight: 122.471 kg (270 lb)              Insurance Information HMO:     PPO:      PCP:      IPA:      80/20:      OTHER:  PRIMARY: Medicare A and B      Policy#: [id number]      Subscriber: self CM Name:       Phone#:      Fax#:  Pre-Cert#:       Employer: retired  Benefits:  Phone #:      Name:  Eff. Date: 08/17/2001     Deduct: $1,316      Out of Pocket Max: none      Life Max: none CIR: 100% after yearly deductible is met      SNF: 100% days 1-20, 80% days 21-100 Outpatient: 80%     Co-Pay: 20% Home Health: 100%      Co-Pay: none DME: 80%     Co-Pay: 20% Providers: patient's choice  SECONDARY: BCBS      Policy#: [id number]      Subscriber: self CM Name:       Phone#: [phone]     Fax#:  Pre-Cert#:       Employer: retired Benefits:  Phone #:      Name:  Eff. Date:      Deduct:       Out of Pocket Max:       Life Max:  CIR:       SNF:  Outpatient:      Co-Pay:  Home Health:       Co-Pay:  DME:      Co-Pay:   Medicaid Application Date:       Case Manager:  Disability Application Date:       Case Worker:   Emergency Contact Information Contact Information    Name Relation Home Work Mobile   [place] Spouse [phone]  [phone]   [place] Daughter   [phone]     Current Medical History  Patient Admitting Diagnosis: Lumbar radiculopathy L2/3 and kyphosis with L4/5 spondylolisthesis History of Present Illness: Mitchell [name] (Red) W Mitchell [name] is a 81 y.o. male with history of CAD with LVD, A flutter, melanoma, ureteral stricture, obesity, Right foot drop X 10 years, right ankle fracture a month ago, L4/5 decompression, back pain with radiation to BLE and weakness due to HNP L2/3 with severe central canal stenosis and worsening of left foraminal narrowing at L4/5 with kyphosis and spondylosis. He elected to  undergo redo decompression with fusion L4/5 adn L5/S1 with L2/3 laminectomy by Dr. Vertell Limber on 10/24/15. Post op has been limited due to recent fall with right ankle fracture, RTC tear/endstage DJD right shoulder,back pain, difficulty processing, decreased standing balance and limited ability to walk. CIR recommended for follow up therapy.       Past Medical History  Past Medical History  Diagnosis Date  . Coronary artery disease     2D ECHO, 08/24/2011 - EF >55%; NUCLEAR STRESS TEST, 04/21/2010 - mild ischemia in Basal Inferolateral and Mid Inferolateral regions, post-stress EF 56%,   . Arthritis   . Melanoma ([place])     l neck  . Adenomatous polyp   . Shortness of breath   . Prostatitis   . Dysrhythmia  atrial fibrilation  . Pneumonia   . Spinal stenosis   . Atrial flutter with rapid ventricular response ([place]) 05/25/2011  . SOB (shortness of breath), secondary to SOB 05/25/2011  . LV dysfunction, EF by ECHO 40-45% 04/29/11 05/25/2011  . Acute on chronic systolic heart failure, usually asymptomatic but with tachycardia HF became acute now resolved 05/26/2011  . Sleep apnea     no longer has since loss of 70 pounds  . Anxiety   . GERD (gastroesophageal reflux disease)     takes Pantoprazole daily  . Constipation     takes Miralax daily as needed  . Depression     takes Cymbalta daily  . Hypertension     takes Amlodipine and Atenolol daily  . Asthma     Symbicort daily as needed  . Hyperlipidemia     takes Zetia daily  . CHF, acute, mild secondary to EF 40-45% an a. flutter with RVR 05/25/2011    takes Furosemide daily as needed  . Ankle fracture     right  . History of blood transfusion 53  yrs ago    got hives    Family History  family history includes Cancer in his paternal grandfather; Heart failure (age of onset: 54) in his father; Heart failure (age of onset: 23) in his mother.  Prior Rehab/Hospitalizations:  Has the patient had major surgery during 100 days prior to  admission? No  Current Medications   Current facility-administered medications:  .  0.9 %  sodium chloride infusion, 250 mL, Intravenous, Continuous, Erline Levine, MD .  acetaminophen (TYLENOL) tablet 650 mg, 650 mg, Oral, Q4H PRN, 650 mg at 10/29/15 0303 **OR** acetaminophen (TYLENOL) suppository 650 mg, 650 mg, Rectal, Q4H PRN, Erline Levine, MD .  alum & mag hydroxide-simeth (MAALOX/MYLANTA) 200-200-20 MG/5ML suspension 30 mL, 30 mL, Oral, Q6H PRN, Erline Levine, MD .  amLODipine (NORVASC) tablet 5 mg, 5 mg, Oral, Daily, Erline Levine, MD, 5 mg at 10/29/15 0933 .  atenolol (TENORMIN) tablet 25 mg, 25 mg, Oral, Daily, Erline Levine, MD, 25 mg at 10/29/15 0933 .  bisacodyl (DULCOLAX) suppository 10 mg, 10 mg, Rectal, Daily PRN, Erline Levine, MD .  calcium carbonate (OS-CAL - dosed in mg of elemental calcium) tablet 1,250 mg, 1,250 mg, Oral, BID WC, Erline Levine, MD, 1,250 mg at 10/29/15 0820 .  dextrose 5 % and 0.45 % NaCl with KCl 20 mEq/L infusion, , Intravenous, Continuous, Erline Levine, MD, Last Rate: 75 mL/hr at 10/25/15 1100 .  docusate sodium (COLACE) capsule 100 mg, 100 mg, Oral, BID, Erline Levine, MD, 100 mg at 10/29/15 0933 .  DULoxetine (CYMBALTA) DR capsule 30 mg, 30 mg, Oral, Daily, Erline Levine, MD, 30 mg at 10/29/15 0933 .  ezetimibe (ZETIA) tablet 10 mg, 10 mg, Oral, Daily, Erline Levine, MD, 10 mg at 10/29/15 1028 .  famotidine (PEPCID) tablet 20 mg, 20 mg, Oral, BID, Erline Levine, MD, 20 mg at 10/29/15 0933 .  furosemide (LASIX) tablet 20 mg, 20 mg, Oral, QODAY, Erline Levine, MD, 20 mg at 10/29/15 0933 .  HYDROcodone-acetaminophen (NORCO/VICODIN) 5-325 MG per tablet 1-2 tablet, 1-2 tablet, Oral, Q4H PRN, Erline Levine, MD, 1 tablet at 10/25/15 2059 .  hydrocortisone cream 1 % 1 application, 1 application, Topical, BID, Erline Levine, MD, 1 application at 12/45/80 1000 .  HYDROmorphone (DILAUDID) injection 0.5-1 mg, 0.5-1 mg, Intravenous, Q2H PRN, Erline Levine, MD, 1 mg at 10/26/15  0015 .  menthol-cetylpyridinium (CEPACOL) lozenge 3 mg, 1 lozenge, Oral, PRN **  OR** phenol (CHLORASEPTIC) mouth spray 1 spray, 1 spray, Mouth/Throat, PRN, Erline Levine, MD .  methocarbamol (ROBAXIN) tablet 500 mg, 500 mg, Oral, Q6H PRN, 500 mg at 10/29/15 0303 **OR** methocarbamol (ROBAXIN) 500 mg in dextrose 5 % 50 mL IVPB, 500 mg, Intravenous, Q6H PRN, Erline Levine, MD .  mometasone-formoterol [facility]) 200-5 MCG/ACT inhaler 2 puff, 2 puff, Inhalation, BID, Erline Levine, MD, 2 puff at 10/29/15 0805 .  ondansetron (ZOFRAN) injection 4 mg, 4 mg, Intravenous, Q4H PRN, Erline Levine, MD .  oxyCODONE-acetaminophen (PERCOCET/ROXICET) 5-325 MG per tablet 1-2 tablet, 1-2 tablet, Oral, Q4H PRN, Erline Levine, MD, 2 tablet at 10/28/15 1138 .  pantoprazole (PROTONIX) EC tablet 40 mg, 40 mg, Oral, Daily, Erline Levine, MD, 40 mg at 10/29/15 0934 .  polyethylene glycol (MIRALAX / GLYCOLAX) packet 17 g, 17 g, Oral, QHS, Erline Levine, MD, 17 g at 10/28/15 2312 .  polyvinyl alcohol (LIQUIFILM TEARS) 1.4 % ophthalmic solution 1 drop, 1 drop, Both Eyes, TID PRN, Erline Levine, MD .  senna-docusate (Senokot-S) tablet 1 tablet, 1 tablet, Oral, QHS PRN, Erline Levine, MD, 1 tablet at 10/28/15 2313 .  sodium chloride flush (NS) 0.9 % injection 3 mL, 3 mL, Intravenous, Q12H, Erline Levine, MD, 3 mL at 10/29/15 1000 .  sodium chloride flush (NS) 0.9 % injection 3 mL, 3 mL, Intravenous, PRN, Erline Levine, MD .  zolpidem (AMBIEN) tablet 5 mg, 5 mg, Oral, QHS PRN, Erline Levine, MD  Patients Current Diet: Diet Heart Room service appropriate?: Yes; Fluid consistency:: Thin  Precautions / Restrictions Precautions Precautions: Fall, Back Precaution Booklet Issued: No Precaution Comments: reviewed all back precautions as pt unable to recall any of them Spinal Brace: Lumbar corset, Applied in sitting position (pt able to provide min assist with donning brace) Restrictions Weight Bearing Restrictions: No Other Position/Activity  Restrictions: CAM boot for RLE due to ankle fx, shoe for left foot   Has the patient had 2 or more falls or a fall with injury in the past year?Yes fell about a month ago, which resulted in a right ankle injury   Prior Activity Level Limited Community (1-2x/wk): Prior to admission patient had functionally declined due to back pain and a recent fall with injury to his ankle.  He was only leaving the house a couple times a week and required assist from wife for safe completion of all basic ADLs.     Home Assistive Devices / Equipment Home Assistive Devices/Equipment: Bedside commode/3-in-1, Hand-held shower hose, Long-handled shoehorn, Long-handled sponge, Reacher, Sock aid, Splint (specify type), Walker (specify type) Home Equipment: Electric scooter  Prior Device Use: Indicate devices/aids used by the patient prior to current illness, exacerbation or injury? Motorized wheelchair or scooter and Radio producer Level Prior Function Level of Independence: Needs assistance Gait / Transfers Assistance Needed: per sister in law, pt needed max A to ambulate so was not really doing it but Games developer ADL's / Homemaking Assistance Needed: wife was helping with bathing and dressing  Self Care: Did the patient need help bathing, dressing, using the toilet or eating?  Needed some help  Indoor Mobility: Did the patient need assistance with walking from room to room (with or without device)? Needed some help  Stairs: Did the patient need assistance with internal or external stairs (with or without device)? Dependent  Functional Cognition: Did the patient need help planning regular tasks such as shopping or remembering to take medications? Needed some help  Current Functional Level Cognition  Overall Cognitive Status: Impaired/Different from baseline Current Attention Level: Focused Orientation Level: Oriented to person, Oriented to time, Disoriented to place, Oriented to  situation Following Commands: Follows one step commands consistently Safety/Judgement: Decreased awareness of safety, Decreased awareness of deficits General Comments: pt confused today, talking about nonsensical things throughout eval    Extremity Assessment (includes Sensation/Coordination)  Upper Extremity Assessment: Defer to OT evaluation  Lower Extremity Assessment: Generalized weakness, RLE deficits/detail, LLE deficits/detail RLE Deficits / Details: hip flex 2/5, knee flex/ ext 2/5, ankle NT due to fx RLE Sensation: history of peripheral neuropathy LLE Deficits / Details: hip flex 2/5, knee flex/ ext 2/5 LLE Sensation: history of peripheral neuropathy    ADLs       Mobility  Overal bed mobility: Needs Assistance Bed Mobility: Rolling, Sidelying to Sit Rolling: Mod assist Sidelying to sit: Mod assist, HOB elevated Sit to supine: +2 for physical assistance, Max assist General bed mobility comments: assist needed to bend knees prior to rolling in bed. mod assist for rolling and for sitting up to edge of bed. cues throughout for sequencing and technique.    Transfers  Overall transfer level: Needs assistance Equipment used: Rolling walker (2 wheeled) Transfers: Sit to/from Stand, Stand Pivot Transfers Sit to Stand: Min assist, +2 physical assistance, From elevated surface Stand pivot transfers: Min assist, +2 safety/equipment General transfer comment: bed elevated to assist wtih standing. cues needed for hand placement and for weight shifting. assist needed to power up into standing. Once standing pt took 4-5 pivot steps with RW from bed to recliner chair. cues on hand placement with sitting down. assist needed due to uncontrolled descent with sitting down.                                Ambulation / Gait / Stairs / Wheelchair Mobility  Ambulation/Gait General Gait Details: limited to std pvt to chair to L side. pt able to complete with RW with verbal directional cues Gait  velocity: slow    Posture / Balance Dynamic Sitting Balance Sitting balance - Comments: requires bilat UE to off weight back and maintain balance Balance Overall balance assessment: Needs assistance Sitting-balance support: Bilateral upper extremity supported Sitting balance-Leahy Scale: Poor Sitting balance - Comments: requires bilat UE to off weight back and maintain balance Standing balance support: Bilateral upper extremity supported Standing balance-Leahy Scale: Poor    Special needs/care consideration BiPAP/CPAP: No  CPM: No Continuous Drip IV: No Dialysis: No         Life Vest: No Oxygen: No Special Bed: No Trach Size: No Wound Vac (area): No       Skin: WDL                               Bowel mgmt: 10/24/15 Bladder mgmt: continent with use of urinal and condom cath Diabetic mgmt: N/A     Previous Home Environment Living Arrangements: Spouse/significant other Available Help at Discharge: Family, Available 24 hours/day Type of Home: House Home Layout: One level Home Access: Ramped entrance Home Care Services: No Additional Comments: wife wants to take husband home but he is over 6 ft tall and >250 lbs and family reports that she has already been having a hard time caring for hom before the surgery  Discharge Living Setting Plans for Discharge Living Setting: Patient's home Type of Home at Discharge: [facility] Discharge  Home Layout: One level Discharge Home Access: Stairs to enter, Ramped entrance (ramped garage steps ) Entrance Stairs-Number of Steps: 3 Discharge Bathroom Shower/Tub: Walk-in shower (with threshold) Discharge Bathroom Toilet: Handicapped height Discharge Bathroom Accessibility: Yes How Accessible: Accessible via walker (half opens walker and utilizes it sideways wife can demo) Does the patient have any problems obtaining your medications?: No  Social/Family/Support Systems Patient Roles: Spouse, Partner Anticipated Caregiver: spouse Anticipated  Caregiver's Contact Information: Nickolas [place] cell:[phone] Ability/Limitations of Caregiver: None Caregiver Availability: 24/7 Discharge Plan Discussed with Primary Caregiver: Yes Is Caregiver In Agreement with Plan?: Yes Does Caregiver/Family have Issues with Lodging/Transportation while Pt is in Rehab?: No   Goals/Additional Needs Patient/Family Goal for Rehab: PT/OT Supervision-Min assist  Expected length of stay: 8-13 days Cultural Considerations: None Dietary Needs: Heart Healthy now, none PTA Equipment Needs: TBD Special Service Needs: None Additional Information: Pt with history of urethra strictures  Pt/Family Agrees to Admission and willing to participate: Yes Program Orientation Provided & Reviewed with Pt/Caregiver Including Roles  & Responsibilities: Yes Additional Information Needs: None Information Needs to be Provided By: N/A   Decrease burden of Care through IP rehab admission: No   Possible need for SNF placement upon discharge: No   Patient Condition: This patient's condition remains as documented in the consult dated 10/28/15, in which the Rehabilitation Physician determined and documented that the patient's condition is appropriate for intensive rehabilitative care in an inpatient rehabilitation facility. Will admit to inpatient rehab today.  Preadmission Screen Completed By:  Gunnar Fusi, 10/29/2015 1:50 PM ______________________________________________________________________   Discussed status with Dr. Posey Pronto on 10/29/15 at 1400 and received telephone approval for admission today.  Admission Coordinator:  Gunnar Fusi, time 1400/Date 10/29/15

## 2015-10-30 ENCOUNTER — Inpatient Hospital Stay (HOSPITAL_COMMUNITY): Payer: Medicare Other | Admitting: Occupational Therapy

## 2015-10-30 ENCOUNTER — Inpatient Hospital Stay (HOSPITAL_COMMUNITY): Payer: Medicare Other | Admitting: Physical Therapy

## 2015-10-30 DIAGNOSIS — R4189 Other symptoms and signs involving cognitive functions and awareness: Secondary | ICD-10-CM

## 2015-10-30 DIAGNOSIS — M5116 Intervertebral disc disorders with radiculopathy, lumbar region: Secondary | ICD-10-CM

## 2015-10-30 DIAGNOSIS — I10 Essential (primary) hypertension: Secondary | ICD-10-CM

## 2015-10-30 DIAGNOSIS — S82891A Other fracture of right lower leg, initial encounter for closed fracture: Secondary | ICD-10-CM

## 2015-10-30 DIAGNOSIS — R269 Unspecified abnormalities of gait and mobility: Secondary | ICD-10-CM

## 2015-10-30 LAB — CBC WITH DIFFERENTIAL/PLATELET
Basophils Absolute: 0 10*3/uL (ref 0.0–0.1)
Basophils Relative: 0 %
Eosinophils Absolute: 0.3 10*3/uL (ref 0.0–0.7)
Eosinophils Relative: 2 %
HCT: 29.7 % — ABNORMAL LOW (ref 39.0–52.0)
Hemoglobin: 9.5 g/dL — ABNORMAL LOW (ref 13.0–17.0)
Lymphocytes Relative: 20 %
Lymphs Abs: 2.5 10*3/uL (ref 0.7–4.0)
MCH: 31 pg (ref 26.0–34.0)
MCHC: 32 g/dL (ref 30.0–36.0)
MCV: 97.1 fL (ref 78.0–100.0)
Monocytes Absolute: 1.4 10*3/uL — ABNORMAL HIGH (ref 0.1–1.0)
Monocytes Relative: 12 %
Neutro Abs: 8.1 10*3/uL — ABNORMAL HIGH (ref 1.7–7.7)
Neutrophils Relative %: 66 %
Platelets: 324 10*3/uL (ref 150–400)
RBC: 3.06 MIL/uL — ABNORMAL LOW (ref 4.22–5.81)
RDW: 13.8 % (ref 11.5–15.5)
WBC: 12.3 10*3/uL — ABNORMAL HIGH (ref 4.0–10.5)

## 2015-10-30 LAB — COMPREHENSIVE METABOLIC PANEL
ALT: 10 U/L — ABNORMAL LOW (ref 17–63)
AST: 22 U/L (ref 15–41)
Albumin: 2.3 g/dL — ABNORMAL LOW (ref 3.5–5.0)
Alkaline Phosphatase: 80 U/L (ref 38–126)
Anion gap: 7 (ref 5–15)
BUN: 10 mg/dL (ref 6–20)
CO2: 28 mmol/L (ref 22–32)
Calcium: 8.2 mg/dL — ABNORMAL LOW (ref 8.9–10.3)
Chloride: 97 mmol/L — ABNORMAL LOW (ref 101–111)
Creatinine, Ser: 0.56 mg/dL — ABNORMAL LOW (ref 0.61–1.24)
GFR calc Af Amer: >60 mL/min (ref >60)
GFR calc non Af Amer: >60 mL/min (ref >60)
Glucose, Bld: 119 mg/dL — ABNORMAL HIGH (ref 65–99)
Potassium: 4 mmol/L (ref 3.5–5.1)
Sodium: 132 mmol/L — ABNORMAL LOW (ref 135–145)
Total Bilirubin: 0.4 mg/dL (ref 0.3–1.2)
Total Protein: 4.9 g/dL — ABNORMAL LOW (ref 6.5–8.1)

## 2015-10-30 LAB — URINALYSIS, ROUTINE W REFLEX MICROSCOPIC
Bilirubin Urine: NEGATIVE
Glucose, UA: NEGATIVE mg/dL
Hgb urine dipstick: NEGATIVE
Ketones, ur: NEGATIVE mg/dL
Leukocytes, UA: NEGATIVE
Nitrite: NEGATIVE
Protein, ur: NEGATIVE mg/dL
Specific Gravity, Urine: 1.013 (ref 1.005–1.030)
pH: 7 (ref 5.0–8.0)

## 2015-10-30 NOTE — Care Management Note (Signed)
[facility] Individual Statement of Services  Patient Name:  Mitchell [name]  Date:  10/30/2015  Welcome to the [facility].  Our goal is to provide you with an individualized program based on your diagnosis and situation, designed to meet your specific needs.  With this comprehensive rehabilitation program, you will be expected to participate in at least 3 hours of rehabilitation therapies Monday-Friday, with modified therapy programming on the weekends.  Your rehabilitation program will include the following services:  Physical Therapy (PT), Occupational Therapy (OT), 24 hour per day rehabilitation nursing, Therapeutic Recreaction (TR), Case Management (Social Worker), Rehabilitation Medicine, Nutrition Services and Pharmacy Services  Weekly team conferences will be held on Wednesday to discuss your progress.  Your Social Worker will talk with you frequently to get your input and to update you on team discussions.  Team conferences with you and your family in attendance may also be held.  Expected length of stay: 18-21 days   Overall anticipated outcome: supervision/min assist level  Depending on your progress and recovery, your program may change. Your Social Worker will coordinate services and will keep you informed of any changes. Your Social Worker's name and contact numbers are listed  below.  The following services may also be recommended but are not provided by the [facility] will be made to provide these services after discharge if needed.  Arrangements include referral to agencies that provide these services.  Your insurance has been verified to be:  [place] Your primary doctor is:  Tedra Senegal  Pertinent information will be shared with your doctor and your insurance  company.  Social Worker:  Ovidio Kin, [place] or ([phone]  Information discussed with and copy given to patient by: Elease Hashimoto, 10/30/2015, 10:55 AM

## 2015-10-30 NOTE — Progress Notes (Signed)
Mitchell [name] PHYSICAL MEDICINE & REHABILITATION     PROGRESS NOTE  Subjective/Complaints: Pt laying in bed this AM.  He slept well overnight.  His wife states he is doing much better off of narcotics.   ROS: Denies CP, SOB, N/V/D.  Objective: Vital Signs: Blood pressure 132/62, pulse 69, temperature 99.3 F (37.4 C), temperature source Oral, resp. rate 16, height 6\' 3"  (1.905 m), weight 129.774 kg (286 lb 1.6 oz), SpO2 94 %. No results found. No results for input(s): WBC, HGB, HCT, PLT in the last 72 hours. No results for input(s): NA, K, CL, GLUCOSE, BUN, CREATININE, CALCIUM in the last 72 hours.  Invalid input(s): CO CBG (last 3)  No results for input(s): GLUCAP in the last 72 hours.  Wt Readings from Last 3 Encounters:  10/29/15 129.774 kg (286 lb 1.6 oz)  10/24/15 122.471 kg (270 lb)  10/16/15 122.471 kg (270 lb)    Physical Exam:  BP 132/62 mmHg  Pulse 69  Temp(Src) 99.3 F (37.4 C) (Oral)  Resp 16  Ht 6\' 3"  (1.905 m)  Wt 129.774 kg (286 lb 1.6 oz)  BMI 35.76 kg/m2  SpO2 94% Constitutional: He appears well-developed and well-nourished.  HENT: Normocephalic and atraumatic.  Eyes: Conjunctivae are normal. Cardiovascular: Normal rate and regular rhythm.  Respiratory: Effort normal and breath sounds normal. No stridor. No respiratory distress. He has no wheezes.  GI: Soft. Bowel sounds are normal. He exhibits no distension. There is no tenderness.  Musculoskeletal: He exhibits edema (1+ edema right ankle. ). He exhibits no tenderness.  Neurological: He is alert.  HOH DTRs symmetric A&Ox1 Sensation diminished to light touch b/l feet Motor: B/l UE: 4+/5 grossly proximal to distal RLE: hip flexion, knee extension 4/5, ankle dorsi/plantarflexion 2+/5 LLE: hip flexion, knee extension 4/5, ankle dorsi/plantar flexion 4+/5  Skin: Skin is warm and dry. No erythema.  Psychiatric: He has a normal mood and affect. Thought content normal. (Mentation improving off  narcotics)   Assessment/Plan: 1. Functional deficits secondary to lumbar radiculopathy L2/3 and kyphosis with L4/5 spondylolisthesis with hx of right foot drop which require 3+ hours per day of interdisciplinary therapy in a comprehensive inpatient rehab setting. Physiatrist is providing close team supervision and 24 hour management of active medical problems listed below. Physiatrist and rehab team continue to assess barriers to discharge/monitor patient progress toward functional and medical goals.  Function:  Bathing Bathing position      Bathing parts      Bathing assist        Upper Body Dressing/Undressing Upper body dressing                    Upper body assist        Lower Body Dressing/Undressing Lower body dressing                                  Lower body assist        Toileting Toileting Toileting activity did not occur: Safety/medical concerns        Toileting assist     Transfers Chair/bed Clinical biochemist          Cognition Comprehension    Expression    Social Interaction    Problem Solving    Memory  Medical Problem List and Plan: 1. Gait and cognitive deficits with weakness secondary to lumbar radiculopathy L2/3 and kyphosis with L4/5 spondylolisthesis with hx of right foot drop  Begin CIR 2. DVT Prophylaxis/Anticoagulation: Pharmaceutical: Lovenox added as 5 days post op.  3. Pain Management:  Scheduled ultram qid for now.  Will continue oxycodone prn for now.  4. Mood: LCSW to follow for evaluation and support.  5. Neuropsych: This patient is capable of making decisions on his own behalf. 6. Skin/Wound Care: Monitor wound for healing. Maintain adequate nutrition and hydration status.  7. Fluids/Electrolytes/Nutrition: Monitor I/O.   Labs pending 8. HTN: Monitor BP bid. On amlodipine and atenolol.  9. Ureteral  stricture: Currently voiding without difficulty Monitor I/O.  10. Ankle fracture: To wear CAM boot when standing/walking. 11. Depression/Anxiety: On cymbalta 12. GERD: On protonix.  13. Asthma: Encourage IS. On dulera for hyperactive airway.  14. Constipation: Increased miralax to bid. 15. CAD: Cont meds 16. Obesity: Body mass index is 33.75 kg/(m^2), diet and exercise education, encourage weight loss to increase endurance and promote overall health 17. Right ankle fracture: Cont boot 18. Chronic back pain: With severe central canal stenosis and foraminal narrowing: Cont meds 19. Right RTC tear and DJD right shoulder: Cont meds 20. Cognitive deficits with difficulty processing: Likely secondary to narcotics, improving   LOS (Days) 1 A FACE TO FACE EVALUATION WAS PERFORMED  Ankit [name] 10/30/2015 8:53 AM

## 2015-10-30 NOTE — Progress Notes (Signed)
Occupational Therapy Session Note  Patient Details  Name: Mitchell [name] MRN: [id number] Date of Birth: [birth year]  Today's Date: 10/30/2015 OT Individual Time:  - 14:00-14:45 (45 min)      Short Term Goals: Week 1:  OT Short Term Goal 1 (Week 1): Pt will sit to stand with min-mod A with RW to prepare for LB dressing. OT Short Term Goal 2 (Week 1): Pt will don pants over feet with reacher with min A. OT Short Term Goal 3 (Week 1): Pt will bathe LB with long sponge with mod A. OT Short Term Goal 4 (Week 1): Pt will transfer to [facility] using scoot transfer with min A.  Skilled Therapeutic Interventions/Progress Updates:    1:1 Pt's daughter present for session. Co Treatment with RT. Pt presented with confusion and not oriented to time, situation or place.  Pt not even aware he recently had back sx.  Daughter reports this is on going but is worse since he just woke up. Pt with max A to come to EOB. Focus on transfer training: including stand step pivot and scoot pivot transfers. Scoot pivot transfer bed to w/c towards his left with max A +2 transfer with max VC to maintain attention to task to complete transfer. Pt able to perform sit to satnd with max A+2 with RW but unable to participate marching in place due to decr attention and confusion. Able to transition with the w/c into bathroom. Able to perform stand step pivots with RW w/c to toilet with max A +2 and total A +2 for toileting. Daughter involved and assisted in standing during toileting at times making task at times +3.  Pt became more reliant on daughter's help when her assistance was available and with decr ability to follow directions. Stand step transfer max +2 back to bed with max A to return to supine in bed. Pt missed 15 min due to fatigue and back pain. Left in bed with daughter present.   Therapy Documentation Precautions:  Precautions Precautions: Fall, Back Precaution Comments: unable to recall 0/3 back precautions.  Required  Braces or Orthoses: Spinal Brace Spinal Brace: Lumbar corset, Applied in sitting position (Pt only able to provide min assist for donning brace.) Restrictions Weight Bearing Restrictions: No Other Position/Activity Restrictions: CAM boot for RLE due to ankle fx, shoe for left foot General:   Vital Signs:   Pain: C/o pain in back throughout session but relief with rest and allowed pt to return to bed to rest at end of session  ADL: ADL ADL Comments: Refer to functional navigator  See Function Navigator for Current Functional Status.   Therapy/Group: Individual Therapy  [name] [facility] 10/30/2015, 9:09 PM

## 2015-10-30 NOTE — Progress Notes (Signed)
Social Work  Social Work Assessment and Plan  Patient Details  Name: Mitchell [name] MRN: [id number] Date of Birth: 25-Jun-1934  Today's Date: 10/30/2015  Problem List:  Patient Active Problem List   Diagnosis Date Noted  . Abnormality of gait   . Intervertebral disc disorder with radiculopathy of lumbar region 10/29/2015  . Benign essential HTN   . Adjustment disorder with mixed anxiety and depressed mood   . Gastroesophageal reflux disease without esophagitis   . Asthma   . Chronic back pain   . Surgery, other elective   . Coronary artery disease involving native coronary artery of native heart without angina pectoris   . Atrial flutter ([place])   . Obesity   . Ureteral stricture   . Right foot drop   . Ankle fracture, right   . Osteoarthritis of spine with radiculopathy, lumbosacral region   . Rotator cuff tear   . Primary osteoarthritis of right shoulder   . Post-operative pain   . Cognitive deficits   . Lumbar stenosis with neurogenic claudication 10/24/2015  . Status post laminectomy with spinal fusion 10/24/2015  . Atrial flutter with RVR- s/p TEE CV Feb 2013 05/25/2011  . Anticoagulant causing adverse effect in therapeutic use 05/25/2011  . LV dysfunction, EF 45% in AF- >55% May 2013 in NSR 05/25/2011  . CHF, acute, mild secondary to EF 40-45% an a. flutter with RVR 05/25/2011  . Erectile dysfunction 05/16/2011  . Low testosterone 05/16/2011  . BPH (benign prostatic hyperplasia) 05/16/2011  . History of recurrent urinary tract infection 05/16/2011  . Spinal stenosis 05/16/2011  . HTN (hypertension), 09/17/2010  . Hyperlipidemia 09/17/2010  . CAD- BMS to PLA 2001, 70% mid LAD, 50-60% prox. LCX, 60% PDA- Low risk Myoview Jan 2013 09/17/2010  . Morbid obesity ([place]) 09/17/2010  . Sleep apnea, intolerant to cpap 09/17/2010  . Pituitary microadenoma with hyperprolactinemia ([place]) 09/17/2010  . Asthma 09/17/2010  . Osteoarthritis 09/17/2010  . History of melanoma in situ  09/17/2010  . Hx of adenomatous colonic polyps 09/17/2010   Past Medical History:  Past Medical History  Diagnosis Date  . Coronary artery disease     2D ECHO, 08/24/2011 - EF >55%; NUCLEAR STRESS TEST, 04/21/2010 - mild ischemia in Basal Inferolateral and Mid Inferolateral regions, post-stress EF 56%,   . Arthritis   . Melanoma ([place])     l neck  . Adenomatous polyp   . Shortness of breath   . Prostatitis   . Dysrhythmia     atrial fibrilation  . Pneumonia   . Spinal stenosis   . Atrial flutter with rapid ventricular response ([place]) 05/25/2011  . SOB (shortness of breath), secondary to SOB 05/25/2011  . LV dysfunction, EF by ECHO 40-45% 04/29/11 05/25/2011  . Acute on chronic systolic heart failure, usually asymptomatic but with tachycardia HF became acute now resolved 05/26/2011  . Sleep apnea     no longer has since loss of 70 pounds  . Anxiety   . GERD (gastroesophageal reflux disease)     takes Pantoprazole daily  . Constipation     takes Miralax daily as needed  . Depression     takes Cymbalta daily  . Hypertension     takes Amlodipine and Atenolol daily  . Asthma     Symbicort daily as needed  . Hyperlipidemia     takes Zetia daily  . CHF, acute, mild secondary to EF 40-45% an a. flutter with RVR 05/25/2011    takes Furosemide daily  as needed  . Ankle fracture     right  . History of blood transfusion 53  yrs ago    got hives   Past Surgical History:  Past Surgical History  Procedure Laterality Date  . Appendectomy    . Hernia repair      umbilical  . Joint replacement      left knee  . Joint replacement      left hip  . Lumbar laminectomy/decompression microdiscectomy    . Back surgery    . Coronary angioplasty    . Tee without cardioversion  05/26/2011    Procedure: TRANSESOPHAGEAL ECHOCARDIOGRAM (TEE);  Surgeon: Sanda Klein, MD;  Location: [place] [facility];  Service: Cardiovascular;  Laterality: N/A;  . Cardioversion  05/26/2011    Procedure: CARDIOVERSION;  Surgeon:  Sanda Klein, MD;  Location: [facility];  Service: Cardiovascular;  Laterality: N/A;  . Cardiac catheterization  03/18/2000    PDA between the first PLA branch stented with a 3x8 Guidant Penta stent resulting in reduction of a 99% stenosis to 0% residual  . Knee replacements Bilateral   . Rotator cuff repair Left    Social History:  reports that he has never smoked. He quit smokeless tobacco use about 43 years ago. His smokeless tobacco use included Chew. He reports that he does not drink alcohol or use illicit drugs.  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 Daughter is a Psychologist, clinical and is busy and son currently is with daughter at the national rode[employer] finals. 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 He would like for PA to check on this. Wife reports he is much better today now  medications straigthen out, back on home meds Recent Psychosocial Issues: Other health issues Pyschiatric History: No history deferred depression screen still feeling the affects of the medicaitons and anesthia. Will monitor and have neuro-psych see if needed. He seems to be coping appropriately at this time. Needing to tkae a nap the first therapy tired him out Substance Abuse History: No issues  Patient / Family Perceptions, Expectations & Goals Pt/Family understanding of illness & functional limitations: Pt and wife can explain his surgery and aware of the treatment plan from her on out. Wife is hopeful he will do well here and regian his mobillity. She plans to be here daily and attend therapies with him. Premorbid pt/family roles/activities: cattle farmer, husband, father, grandfather, chruch member, etc Anticipated changes in roles/activities/participation: resume Pt/family expectations/goals: Pt states: " I want to be able to move around on my own so as not to burden my wife."  Wife states: "  I am hopeful but we will deal with what he needs."  [employer]: None Premorbid Home Care/DME Agencies: Other (Comment) (used before) Transportation available at discharge: Wife and children  Discharge Planning Living Arrangements: Spouse/significant other Support Systems: Spouse/significant other, Children, Water engineer, Social worker community Type of Residence: Private residence Insurance Resources: [employer], Multimedia programmer (specify) Nurse, mental health) Financial Resources: Employment, Biomedical scientist, Social Security Financial Screen Referred: No Living Expenses: Own Money Management: Spouse, Patient Does the patient have any problems obtaining your medications?: No Home Management: Wife does home management Patient/Family Preliminary Plans: Plan to return home with wife who can provide min assist level but pt is a large man and wife is small. Will await teams'  evaluations and work on a safe discharge plan. Will see how pt does now he is clearer from being back on home medications. Social Work Anticipated Follow Up Needs: HH/OP  Clinical Impression Pleasant couple who are willing to do whatever he needs to recover form his back surgery. He is hampered by his ankle fracture and cam boot which rubs his other leg. Both want to know if he has worn this long enough and can remove or Have replaced with something else. Will ask PA to check into this. Will await teams' evaluations and work on a safe discharge plan. Wife plans to be here daily and attend therapies with pt to see his progress.  Elease Hashimoto 10/30/2015, 10:52 AM

## 2015-10-30 NOTE — IPOC Note (Signed)
Overall Plan of Care [facility]) Patient Details Name: Mitchell [name] MRN: [id number] DOB: 10-16-1934  Admitting Diagnosis: Lumbar Fusion  Hospital Problems: Active Problems:   Intervertebral disc disorder with radiculopathy of lumbar region   Abnormality of gait     Functional Problem List: Nursing Behavior, Bladder, Safety, Sensory, Motor, Skin Integrity, Bowel, Endurance, Medication Management, Pain, Nutrition, Perception  PT Balance, Motor, Pain, Safety  OT Balance, Cognition, Endurance, Motor, Pain, Safety  SLP    TR Endurance, Motor, Pain, Safety, Skin Integrity       Basic ADL's: OT Bathing, Dressing, Toileting     Advanced  ADL's: OT       Transfers: PT Bed Mobility, Bed to Chair, Car, Furniture, Floor  OT Toilet, Metallurgist: PT Ambulation, Emergency planning/management officer, Stairs     Additional Impairments: OT None  SLP        TR      Anticipated Outcomes Item Anticipated Outcome  Self Feeding I  Swallowing      Basic self-care  supervision UB self care, min A LB self care  Toileting  min A   Bathroom Transfers min A to shower, supervision to toilet  Bowel/Bladder  Pt will manage bowel and bladder with min assist   Transfers  Supervision A with LRAD.   Locomotion  Mod I in [place] within home and Gait for household distances with supervision-min A.   Communication     Cognition     Pain  Pt will rate pain at 4 or less on a scale of 0-10.   Safety/Judgment  Pt will remain free of falls and injury with min assist    Therapy Plan: PT Intensity: Minimum of 1-2 x/day ,45 to 90 minutes PT Frequency: 5 out of 7 days PT Duration Estimated Length of Stay: 2.5-3 weeks.  OT Intensity: Minimum of 1-2 x/day, 45 to 90 minutes OT Frequency: 5 out of 7 days OT Duration/Estimated Length of Stay: 20-21 days         Team Interventions: Nursing Interventions Patient/Family Education, Bladder Management, Dysphagia/Aspiration Precaution Training, Cognitive  Remediation/Compensation, Medication Management, Skin Care/Wound Management, Bowel Management, Disease Management/Prevention, Pain Management, Discharge Planning  PT interventions Ambulation/gait training, Balance/vestibular training, Cognitive remediation/compensation, Community reintegration, Discharge planning, Disease management/prevention, DME/adaptive equipment instruction, Functional mobility training, Neuromuscular re-education, Pain management, Patient/family education, Psychosocial support, Skin care/wound management, Splinting/orthotics, Stair training, Therapeutic Activities, Therapeutic Exercise, UE/LE Strength taining/ROM, UE/LE Coordination activities, Visual/perceptual remediation/compensation, Wheelchair propulsion/positioning, Functional electrical stimulation  OT Interventions Balance/vestibular training, Cognitive remediation/compensation, Discharge planning, DME/adaptive equipment instruction, Neuromuscular re-education, Pain management, Psychosocial support, Patient/family education, Self Care/advanced ADL retraining, Therapeutic Activities, Therapeutic Exercise, UE/LE Strength taining/ROM, UE/LE Coordination activities, Functional mobility training  SLP Interventions    TR Interventions    SW/CM Interventions Discharge Planning, Psychosocial Support, Patient/Family Education    Team Discharge Planning: Destination: PT-Home ,OT- Home , SLP-  Projected Follow-up: PT-Home health PT, OT-  Home health OT, SLP-  Projected Equipment Needs: PT-Rolling walker with 5" wheels, Wheelchair (measurements), Wheelchair cushion (measurements), To be determined, OT- 3 in 1 bedside comode, SLP-  Equipment Details: PT- , OT-  Patient/family involved in discharge planning: PT- Patient, Family member/caregiver,  OT-Patient, Family member/caregiver, SLP-   MD ELOS: 18-22 days. Medical Rehab Prognosis:  Good Assessment:  81 y.o. male with history of CAD with LVD, A flutter, melanoma, ureteral  stricture, obesity, right foot drop X 10 years, right ankle fracture a month ago, L4/5 decompression, back pain with radiation to  BLE and weakness due to HNP L2/3 with severe central canal stenosis and worsening of left foraminal narrowing at L4/5 with kyphosis and spondylosis. He elected to undergo redo decompression with fusion L4/5 adn L5/S1 with L2/3 laminectomy by Dr. Vertell Limber on 10/24/15. Post op has been limited due to recent fall with right ankle fracture, RTC tear/endstage DJD right shoulder,back pain, difficulty processing, decreased standing balance and limited ability to walk. Will set goals for Min A/Supervision with therapies.      See Team Conference Notes for weekly updates to the plan of care

## 2015-10-30 NOTE — Evaluation (Signed)
Occupational Therapy Assessment and Plan  Patient Details  Name: Mitchell [name] MRN: [id number] Date of Birth: 09/30/1934  OT Diagnosis: acute pain, cognitive deficits and muscle weakness (generalized) Rehab Potential: Rehab Potential (ACUTE ONLY): Good ELOS: 20-21 days   Today's Date: 10/30/2015 OT Individual Time: 3903-0092 OT Individual Time Calculation (min): 70 min     Problem List:  Patient Active Problem List   Diagnosis Date Noted  . Abnormality of gait   . Intervertebral disc disorder with radiculopathy of lumbar region 10/29/2015  . Benign essential HTN   . Adjustment disorder with mixed anxiety and depressed mood   . Gastroesophageal reflux disease without esophagitis   . Asthma   . Chronic back pain   . Surgery, other elective   . Coronary artery disease involving native coronary artery of native heart without angina pectoris   . Atrial flutter (Mitchell [name])   . Obesity   . Ureteral stricture   . Right foot drop   . Ankle fracture, right   . Osteoarthritis of spine with radiculopathy, lumbosacral region   . Rotator cuff tear   . Primary osteoarthritis of right shoulder   . Post-operative pain   . Cognitive deficits   . Lumbar stenosis with neurogenic claudication 10/24/2015  . Status post laminectomy with spinal fusion 10/24/2015  . Atrial flutter with RVR- s/p TEE CV Feb 2013 05/25/2011  . Anticoagulant causing adverse effect in therapeutic use 05/25/2011  . LV dysfunction, EF 45% in AF- >55% May 2013 in NSR 05/25/2011  . CHF, acute, mild secondary to EF 40-45% an a. flutter with RVR 05/25/2011  . Erectile dysfunction 05/16/2011  . Low testosterone 05/16/2011  . BPH (benign prostatic hyperplasia) 05/16/2011  . History of recurrent urinary tract infection 05/16/2011  . Spinal stenosis 05/16/2011  . HTN (hypertension), 09/17/2010  . Hyperlipidemia 09/17/2010  . CAD- BMS to PLA 2001, 70% mid LAD, 50-60% prox. LCX, 60% PDA- Low risk Myoview Jan 2013 09/17/2010  .  Morbid obesity ([place]) 09/17/2010  . Sleep apnea, intolerant to cpap 09/17/2010  . Pituitary microadenoma with hyperprolactinemia ([place]) 09/17/2010  . Asthma 09/17/2010  . Osteoarthritis 09/17/2010  . History of melanoma in situ 09/17/2010  . Hx of adenomatous colonic polyps 09/17/2010    Past Medical History:  Past Medical History  Diagnosis Date  . Coronary artery disease     2D ECHO, 08/24/2011 - EF >55%; NUCLEAR STRESS TEST, 04/21/2010 - mild ischemia in Basal Inferolateral and Mid Inferolateral regions, post-stress EF 56%,   . Arthritis   . Melanoma ([place])     l neck  . Adenomatous polyp   . Shortness of breath   . Prostatitis   . Dysrhythmia     atrial fibrilation  . Pneumonia   . Spinal stenosis   . Atrial flutter with rapid ventricular response ([place]) 05/25/2011  . SOB (shortness of breath), secondary to SOB 05/25/2011  . LV dysfunction, EF by ECHO 40-45% 04/29/11 05/25/2011  . Acute on chronic systolic heart failure, usually asymptomatic but with tachycardia HF became acute now resolved 05/26/2011  . Sleep apnea     no longer has since loss of 70 pounds  . Anxiety   . GERD (gastroesophageal reflux disease)     takes Pantoprazole daily  . Constipation     takes Miralax daily as needed  . Depression     takes Cymbalta daily  . Hypertension     takes Amlodipine and Atenolol daily  . Asthma     Symbicort  daily as needed  . Hyperlipidemia     takes Zetia daily  . CHF, acute, mild secondary to EF 40-45% an a. flutter with RVR 05/25/2011    takes Furosemide daily as needed  . Ankle fracture     right  . History of blood transfusion 53  yrs ago    got hives   Past Surgical History:  Past Surgical History  Procedure Laterality Date  . Appendectomy    . Hernia repair      umbilical  . Joint replacement      left knee  . Joint replacement      left hip  . Lumbar laminectomy/decompression microdiscectomy    . Back surgery    . Coronary angioplasty    . Tee without  cardioversion  05/26/2011    Procedure: TRANSESOPHAGEAL ECHOCARDIOGRAM (TEE);  Surgeon: Sanda Klein, MD;  Location: [facility];  Service: Cardiovascular;  Laterality: N/A;  . Cardioversion  05/26/2011    Procedure: CARDIOVERSION;  Surgeon: Sanda Klein, MD;  Location: [facility];  Service: Cardiovascular;  Laterality: N/A;  . Cardiac catheterization  03/18/2000    PDA between the first PLA branch stented with a 3x8 Guidant Penta stent resulting in reduction of a 99% stenosis to 0% residual  . Knee replacements Bilateral   . Rotator cuff repair Left     Assessment & Plan Clinical Impression:  Mitchell [name] (Red)  Mitchell [name] is a 81 y.o. male with history of CAD with LVD, A flutter, melanoma, ureteral stricture, obesity, Right foot drop X 10 years, right ankle fracture a month ago, L4/5 decompression, back pain with radiation to BLE and weakness due to  HNP L2/3 with severe central canal stenosis and worsening of left foraminal narrowing at L4/5 with kyphosis and spondylosis.  He elected to undergo redo decompression with fusion L4/5 adn L5/S1 with L2/3 laminectomy by Dr. Vertell Limber on 10/24/15. Post op has been limited due to recent fall with right ankle fracture, RTC tear/endstage DJD right shoulder, back pain, difficulty processing, decreased standing balance and limited ability to walk.  CIR recommended for follow up therapy.   Patient transferred to CIR on 10/29/2015 .    Patient currently requires max with basic self-care skills secondary to muscle weakness, decreased cardiorespiratoy endurance, decreased awareness, decreased problem solving, decreased safety awareness, decreased memory and delayed processing and decreased sitting balance, decreased standing balance, decreased postural control and difficulty maintaining precautions.  Prior to hospitalization, patient could complete basic self care with modified independent .  Patient will benefit from skilled intervention to increase independence with  basic self-care skills prior to discharge home with care partner.  Anticipate patient will require minimal physical assistance and follow up home health.  OT - End of Session Activity Tolerance: Tolerates 10 - 20 min activity with multiple rests OT Assessment Rehab Potential (ACUTE ONLY): Good OT Patient demonstrates impairments in the following area(s): Balance;Cognition;Endurance;Motor;Pain;Safety OT Basic ADL's Functional Problem(s): Bathing;Dressing;Toileting OT Transfers Functional Problem(s): Toilet;Tub/Shower OT Additional Impairment(s): None OT Plan OT Intensity: Minimum of 1-2 x/day, 45 to 90 minutes OT Frequency: 5 out of 7 days OT Duration/Estimated Length of Stay: 20-21 days OT Treatment/Interventions: Balance/vestibular training;Cognitive remediation/compensation;Discharge planning;DME/adaptive equipment instruction;Neuromuscular re-education;Pain management;Psychosocial support;Patient/family education;Self Care/advanced ADL retraining;Therapeutic Activities;Therapeutic Exercise;UE/LE Strength taining/ROM;UE/LE Coordination activities;Functional mobility training OT Self Feeding Anticipated Outcome(s): I OT Basic Self-Care Anticipated Outcome(s): supervision UB self care, min A LB self care OT Toileting Anticipated Outcome(s): min A OT Bathroom Transfers Anticipated Outcome(s): min A to shower, supervision to toilet OT  Recommendation Patient destination: Home Follow Up Recommendations: Home health OT Equipment Recommended: 3 in 1 bedside comode   Skilled Therapeutic Intervention Pt seen for initial evaluation, pt/family education, and ADL retraining. Pt quite disoriented, not realizing that he had back surgery or what the corset was for.  Wife present and actively assisted in therapy session.  Used heavy duty RW to provide stability with standing. Pt was unable to take a step safely to transfer to [facility] so used scoot transfers instead with mod-max A to guide body. Completed B/D  from EOB and reviewed back precautions. Pt will need a great deal of reinforcement with precautions. Pt adjusted in bed with all needs met.   OT Evaluation Precautions/Restrictions  Precautions Precautions: Fall;Back Precaution Comments: unable to recall 0/3 back precautions.  Required Braces or Orthoses: Spinal Brace Spinal Brace: Lumbar corset;Applied in sitting position (Pt only able to provide min assist for donning brace.) Restrictions Weight Bearing Restrictions: No Other Position/Activity Restrictions: CAM boot for RLE due to ankle fx, shoe for left foot   Pain Pain Assessment Pain Assessment: No/denies pain Pain Score: 0-No pain Home Living/Prior Functioning Home Living Living Arrangements: Spouse/significant other Available Help at Discharge: Family Type of Home: House Home Access: Ramped entrance Home Layout: One level Bathroom Shower/Tub: Multimedia programmer: Handicapped height Additional Comments: Raise toilet seat voer handicap height.   Lives With: Spouse Prior Function Level of Independence: Needs assistance with ADLs, Requires assistive device for independence  Able to Take Stairs?: Yes Driving: Yes Vocation: Full time employment Vocation Requirements: Physicist, medical.  Comments: R AFO for 6 months PTA ADL ADL ADL Comments: Refer to functional navigator Vision/Perception  Vision- History Baseline Vision/History: Wears glasses Wears Glasses: At all times Patient Visual Report: No change from baseline Vision- Assessment Vision Assessment?: No apparent visual deficits  Cognition Overall Cognitive Status: Impaired/Different from baseline Arousal/Alertness: Awake/alert Orientation Level: Person Year: 2016 Month: June Day of Week: Correct Memory: Impaired Memory Impairment: Retrieval deficit;Decreased recall of new information (did not know he had back surgery ) Immediate Memory Recall: Sock;Blue;Bed Memory Recall: Sock;Blue;Bed Memory Recall  Sock: With Cue Memory Recall Blue: With Cue Memory Recall Bed: With Cue Awareness: Impaired Awareness Impairment: Intellectual impairment;Emergent impairment Problem Solving: Impaired Safety/Judgment: Impaired Comments: Decrease safety awareness in transfers.  Sensation Sensation Light Touch: Appears Intact Stereognosis: Appears Intact Hot/Cold: Appears Intact Proprioception: Appears Intact Coordination Gross Motor Movements are Fluid and Coordinated: No Fine Motor Movements are Fluid and Coordinated: No Coordination and Movement Description: Decreased strength in BLE prevents coordinated movement with Gait  and transfers.  Finger Nose Finger Test: 8x on L hand in 10 sec, not tested on R due to shoulder limitations Motor  Motor Motor: Other (comment) Motor - Skilled Clinical Observations: Generalized weakness.  Mobility  Bed Mobility Bed Mobility: Rolling Right;Rolling Left;Supine to Sit;Sit to Supine Rolling Right: 3: Mod assist Rolling Right Details: Verbal cues for technique;Verbal cues for precautions/safety Rolling Left: 3: Mod assist Rolling Left Details: Verbal cues for technique;Verbal cues for precautions/safety Supine to Sit: 3: Mod assist Supine to Sit Details: Verbal cues for technique;Verbal cues for precautions/safety;Verbal cues for sequencing Sit to Supine: 3: Mod assist;2: Max assist Sit to Supine - Details: Verbal cues for technique;Verbal cues for precautions/safety;Verbal cues for gait pattern Transfers Sit to Stand: 2: Max assist Sit to Stand Details: Verbal cues for technique;Verbal cues for safe use of DME/AE;Verbal cues for precautions/safety;Manual facilitation for weight shifting Stand to Sit: 2: Max assist Stand to Sit Details (  indicate cue type and reason): Verbal cues for technique;Verbal cues for precautions/safety;Verbal cues for safe use of DME/AE  Trunk/Postural Assessment  Postural Control Postural Control: Deficits on evaluation (posterior  lean in sitting)  Balance Static Sitting Balance Static Sitting - Level of Assistance: 5: Stand by assistance Dynamic Sitting Balance Dynamic Sitting - Level of Assistance: 4: Min assist Static Standing Balance Static Standing - Level of Assistance: 3: Mod assist (with RW) Dynamic Standing Balance Dynamic Standing - Level of Assistance: 2: Max assist (with RW) Extremity/Trunk Assessment RUE Assessment RUE Assessment: Exceptions to [facility] (shoulder flexion to 90 degrees, old RTC injury) LUE Assessment LUE Assessment: Within Functional Limits   See Function Navigator for Current Functional Status.   Refer to Care Plan for Long Term Goals  Recommendations for other services: Neuropsych  Discharge Criteria: Patient will be discharged from OT if patient refuses treatment 3 consecutive times without medical reason, if treatment goals not met, if there is a change in medical status, if patient makes no progress towards goals or if patient is discharged from hospital.  The above assessment, treatment plan, treatment alternatives and goals were discussed and mutually agreed upon: by patient and by family  Kayo Zion 10/30/2015, 3:15 PM

## 2015-10-30 NOTE — Progress Notes (Signed)
Patient information reviewed and entered into eRehab system by Julias Mould, RN, CRRN, PPS Coordinator.  Information including medical coding and functional independence measure will be reviewed and updated through discharge.     Per nursing patient was given "Data Collection Information Summary for Patients in Inpatient Rehabilitation Facilities with attached "Privacy Act Statement-Health Care Records" upon admission.  

## 2015-10-30 NOTE — Evaluation (Signed)
Physical Therapy Assessment and Plan  Patient Details  Name: Mitchell [name] MRN: [id number] Date of Birth: [birth year]  PT Diagnosis: Abnormal posture, Abnormality of gait, Difficulty walking, Low back pain and Muscle weakness Rehab Potential: Good ELOS: 2.5-3 weeks.    Today's Date: 10/30/2015 PT Individual Time: 6237-6283 PT Individual Time Calculation (min): 77 min    Problem List:  Patient Active Problem List   Diagnosis Date Noted  . Abnormality of gait   . Intervertebral disc disorder with radiculopathy of lumbar region 10/29/2015  . Benign essential HTN   . Adjustment disorder with mixed anxiety and depressed mood   . Gastroesophageal reflux disease without esophagitis   . Asthma   . Chronic back pain   . Surgery, other elective   . Coronary artery disease involving native coronary artery of native heart without angina pectoris   . Atrial flutter ()   . Obesity   . Ureteral stricture   . Right foot drop   . Ankle fracture, right   . Osteoarthritis of spine with radiculopathy, lumbosacral region   . Rotator cuff tear   . Primary osteoarthritis of right shoulder   . Post-operative pain   . Cognitive deficits   . Lumbar stenosis with neurogenic claudication 10/24/2015  . Status post laminectomy with spinal fusion 10/24/2015  . Atrial flutter with RVR- s/p TEE CV Feb 2013 05/25/2011  . Anticoagulant causing adverse effect in therapeutic use 05/25/2011  . LV dysfunction, EF 45% in AF- >55% May 2013 in NSR 05/25/2011  . CHF, acute, mild secondary to EF 40-45% an a. flutter with RVR 05/25/2011  . Erectile dysfunction 05/16/2011  . Low testosterone 05/16/2011  . BPH (benign prostatic hyperplasia) 05/16/2011  . History of recurrent urinary tract infection 05/16/2011  . Spinal stenosis 05/16/2011  . HTN (hypertension), 09/17/2010  . Hyperlipidemia 09/17/2010  . CAD- BMS to PLA 2001, 70% mid LAD, 50-60% prox. LCX, 60% PDA- Low risk Myoview Jan 2013 09/17/2010  . Morbid  obesity ([place]) 09/17/2010  . Sleep apnea, intolerant to cpap 09/17/2010  . Pituitary microadenoma with hyperprolactinemia ([place]) 09/17/2010  . Asthma 09/17/2010  . Osteoarthritis 09/17/2010  . History of melanoma in situ 09/17/2010  . Hx of adenomatous colonic polyps 09/17/2010    Past Medical History:  Past Medical History  Diagnosis Date  . Coronary artery disease     2D ECHO, 08/24/2011 - EF >55%; NUCLEAR STRESS TEST, 04/21/2010 - mild ischemia in Basal Inferolateral and Mid Inferolateral regions, post-stress EF 56%,   . Arthritis   . Melanoma ([place])     l neck  . Adenomatous polyp   . Shortness of breath   . Prostatitis   . Dysrhythmia     atrial fibrilation  . Pneumonia   . Spinal stenosis   . Atrial flutter with rapid ventricular response ([place]) 05/25/2011  . SOB (shortness of breath), secondary to SOB 05/25/2011  . LV dysfunction, EF by ECHO 40-45% 04/29/11 05/25/2011  . Acute on chronic systolic heart failure, usually asymptomatic but with tachycardia HF became acute now resolved 05/26/2011  . Sleep apnea     no longer has since loss of 70 pounds  . Anxiety   . GERD (gastroesophageal reflux disease)     takes Pantoprazole daily  . Constipation     takes Miralax daily as needed  . Depression     takes Cymbalta daily  . Hypertension     takes Amlodipine and Atenolol daily  . Asthma  Symbicort daily as needed  . Hyperlipidemia     takes Zetia daily  . CHF, acute, mild secondary to EF 40-45% an a. flutter with RVR 05/25/2011    takes Furosemide daily as needed  . Ankle fracture     right  . History of blood transfusion 53  yrs ago    got hives   Past Surgical History:  Past Surgical History  Procedure Laterality Date  . Appendectomy    . Hernia repair      umbilical  . Joint replacement      left knee  . Joint replacement      left hip  . Lumbar laminectomy/decompression microdiscectomy    . Back surgery    . Coronary angioplasty    . Tee without cardioversion   05/26/2011    Procedure: TRANSESOPHAGEAL ECHOCARDIOGRAM (TEE);  Surgeon: Sanda Klein, MD;  Location: [facility];  Service: Cardiovascular;  Laterality: N/A;  . Cardioversion  05/26/2011    Procedure: CARDIOVERSION;  Surgeon: Sanda Klein, MD;  Location: [facility];  Service: Cardiovascular;  Laterality: N/A;  . Cardiac catheterization  03/18/2000    PDA between the first PLA branch stented with a 3x8 Guidant Penta stent resulting in reduction of a 99% stenosis to 0% residual  . Knee replacements Bilateral   . Rotator cuff repair Left     Assessment & Plan Clinical Impression: Patient is a 81 y.o. male with history of CAD with LVD, A flutter, melanoma, ureteral stricture, obesity, Right foot drop X 10 years, right ankle fracture a month ago, L4/5 decompression, back pain with radiation to BLE and weakness due to HNP L2/3 with severe central canal stenosis and worsening of left foraminal narrowing at L4/5 with kyphosis and spondylosis. He elected to undergo redo decompression with fusion L4/5 adn L5/S1 with L2/3 laminectomy by Dr. Vertell Limber on 10/24/15. Post op has been limited due to recent fall with right ankle fracture, RTC tear/endstage DJD right shoulder,back pain, difficulty processing, decreased standing balance and limited ability to walk. Patient transferred to CIR on 10/29/2015 .   Patient currently requires max with mobility secondary to muscle weakness, decreased cardiorespiratoy endurance and decreased standing balance, decreased postural control and difficulty maintaining precautions.  Prior to hospitalization, patient was modified independent  with mobility and lived with Spouse in a House home.  Home access is  Ramped entrance.  Patient will benefit from skilled PT intervention to maximize safe functional mobility, minimize fall risk and decrease caregiver burden for planned discharge home with 24 hour supervision.  Anticipate patient will benefit from follow up [place] at  discharge.  PT - End of Session Activity Tolerance: Tolerates 10 - 20 min activity with multiple rests Endurance Deficit: Yes PT Assessment Rehab Potential (ACUTE/IP ONLY): Good Barriers to Discharge: Decreased caregiver support PT Patient demonstrates impairments in the following area(s): Balance;Motor;Pain;Safety PT Transfers Functional Problem(s): Bed Mobility;Bed to Chair;Car;Furniture;Floor PT Locomotion Functional Problem(s): Ambulation;Wheelchair Mobility;Stairs PT Plan PT Intensity: Minimum of 1-2 x/day ,45 to 90 minutes PT Frequency: 5 out of 7 days PT Duration Estimated Length of Stay: 2.5-3 weeks.  PT Treatment/Interventions: Ambulation/gait training;Balance/vestibular training;Cognitive remediation/compensation;Community reintegration;Discharge planning;Disease management/prevention;DME/adaptive equipment instruction;Functional mobility training;Neuromuscular re-education;Pain management;Patient/family education;Psychosocial support;Skin care/wound management;Splinting/orthotics;Stair training;Therapeutic Activities;Therapeutic Exercise;UE/LE Strength taining/ROM;UE/LE Coordination activities;Visual/perceptual remediation/compensation;Wheelchair propulsion/positioning;Functional electrical stimulation PT Transfers Anticipated Outcome(s): Supervision A with LRAD.  PT Locomotion Anticipated Outcome(s): Mod I in [place] within home and Gait for household distances with supervision-min A.  PT Recommendation Follow Up Recommendations: Home health PT Patient destination: Home Equipment  Recommended: Rolling walker with 5" wheels;Wheelchair (measurements);Wheelchair cushion (measurements);To be determined   Skilled Therapeutic Intervention Patient received supine in bed and agreeable to PT. PT instructed patient in Evaluation and initiated treatment intervention. See below for results.  PT educated patient and family members on Goals and expectations for PT in rehab as well as addressing  concerns of mobility and strength following lumbar surgery.  Patient instructed in Sit<>stand and stand pivot as listed below with max cues to maintain back precautions. Patient demonstrated poor awareness of back precaustions and decreased LE stability with all transfers. PT attempted to instruct patient in car transfer, but increased fatigue prevented safe and effective transfer, therefore patietn returned to [facility] with Max +2 A for WC positioning.   Patient left in [facility] with call bell within reach at end of PT treatment.   PT Evaluation Precautions/Restrictions Precautions Precautions: Fall;Back Precaution Comments: unable to recall 0/3 back precautions.  Required Braces or Orthoses: Spinal Brace Spinal Brace: Lumbar corset;Applied in sitting position (Pt only able to provide min assist for donning brace.) Restrictions Weight Bearing Restrictions: No Other Position/Activity Restrictions: CAM boot for RLE due to ankle fx, shoe for left foot General   Vital SignsTherapy Vitals Temp: 98.7 F (37.1 C) Temp Source: Oral Pulse Rate: 70 Resp: 18 BP: 129/67 mmHg Patient Position (if appropriate): Lying Oxygen Therapy SpO2: 96 % O2 Device: Not Delivered Pain   0/10  Home Living/Prior Functioning Home Living Available Help at Discharge: Family Type of Home: House Home Access: Ramped entrance Home Layout: One level Bathroom Shower/Tub: Multimedia programmer: Handicapped height Additional Comments: Raise toilet seat voer handicap height.   Lives With: Spouse Prior Function Level of Independence: Needs assistance with ADLs;Requires assistive device for independence  Able to Take Stairs?: Yes Driving: Yes Vocation: Full time employment Vocation Requirements: Physicist, medical.  Comments: R AFO for 6 months PTA Vision/Perception     Cognition Overall Cognitive Status: Impaired/Different from baseline Arousal/Alertness: Awake/alert Orientation Level: Oriented to person Memory:  Appears intact Memory Impairment: Decreased recall of new information Awareness: Impaired Awareness Impairment: Emergent impairment Problem Solving: Impaired Safety/Judgment: Impaired Comments: Decrease safety awareness in transfers due to reduces recall of new information.  Sensation Sensation Light Touch: Appears Intact Stereognosis: Appears Intact Hot/Cold: Appears Intact Proprioception: Appears Intact Coordination Gross Motor Movements are Fluid and Coordinated: No Fine Motor Movements are Fluid and Coordinated: No Coordination and Movement Description: Decreased strength in BLE prevents coordinated movement with Gait  and transfers.  Finger Nose Finger Test: 8x on L hand in 10 sec, not tested on R due to shoulder limitations Motor  Motor Motor: Other (comment) Motor - Skilled Clinical Observations: Generalized weakness.   Mobility Bed Mobility Bed Mobility: Rolling Right;Rolling Left;Supine to Sit;Sit to Supine Rolling Right: 3: Mod assist Rolling Right Details: Verbal cues for technique;Verbal cues for precautions/safety Rolling Left: 3: Mod assist Rolling Left Details: Verbal cues for technique;Verbal cues for precautions/safety Supine to Sit: 3: Mod assist Supine to Sit Details: Verbal cues for technique;Verbal cues for precautions/safety;Verbal cues for sequencing Sit to Supine: 3: Mod assist;2: Max assist Sit to Supine - Details: Verbal cues for technique;Verbal cues for precautions/safety;Verbal cues for gait pattern Transfers Transfers: Yes Sit to Stand: 2: Max assist Sit to Stand Details: Verbal cues for technique;Verbal cues for safe use of DME/AE;Verbal cues for precautions/safety;Manual facilitation for weight shifting Stand to Sit: 2: Max assist Stand to Sit Details (indicate cue type and reason): Verbal cues for technique;Verbal cues for precautions/safety;Verbal cues  for safe use of DME/AE Stand Pivot Transfers: 2: Max assist Stand Pivot Transfer Details:  Verbal cues for sequencing;Verbal cues for technique;Verbal cues for precautions/safety;Verbal cues for gait pattern Locomotion  Ambulation Ambulation: Yes Ambulation/Gait Assistance: 3: Mod assist Ambulation Distance (Feet): 9 Feet Assistive device: Rolling walker Ambulation/Gait Assistance Details: Verbal cues for gait pattern;Verbal cues for safe use of DME/AE;Verbal cues for precautions/safety;Verbal cues for technique Gait Gait: Yes Gait Pattern: Impaired Gait Pattern: Step-to pattern Stairs / Additional Locomotion Stairs: No Wheelchair Mobility Wheelchair Mobility: Yes Wheelchair Assistance: 5: Investment banker, operational Details: Verbal cues for technique;Verbal cues for precautions/safety;Verbal cues for gait Product/process development scientist: Both upper extremities Wheelchair Parts Management: Needs assistance  Trunk/Postural Assessment  Cervical Assessment Cervical Assessment: Within Water engineer Thoracic Assessment: Within Functional Limits Lumbar Assessment Lumbar Assessment: Exceptions to [facility] Postural Control Postural Control: Deficits on evaluation (posterior lean in sitting)  Balance Static Sitting Balance Static Sitting - Level of Assistance: 5: Stand by assistance Dynamic Sitting Balance Dynamic Sitting - Level of Assistance: 4: Min assist Static Standing Balance Static Standing - Level of Assistance: 3: Mod assist (with RW) Dynamic Standing Balance Dynamic Standing - Level of Assistance: 2: Max assist (with RW) Extremity Assessment      RLE Assessment RLE Assessment: Exceptions to [name] RLE AROM (degrees) RLE Overall AROM Comments: Patient unable to to perform hip flexion >90degrees due to decreased strength. all other motions WFL RLE Strength RLE Overall Strength Comments: Hip flexion 3-/5 all other motions test for hip and knee 4/5. unable to test Ankle strength due to Cam boot in place.   LLE Assessment LLE Assessment:  Exceptions to WFL LLE AROM (degrees) LLE Overall AROM Comments: Patient unable to to perform hip flexion >90degrees due to decreased strength. all other motions WFL LLE Strength LLE Overall Strength Comments: hip flexion 3-/5. all other motions tested 4/5 throughout LE.    See Function Navigator for Current Functional Status.   Refer to Care Plan for Long Term Goals  Recommendations for other services: None  Discharge Criteria: Patient will be discharged from PT if patient refuses treatment 3 consecutive times without medical reason, if treatment goals not met, if there is a change in medical status, if patient makes no progress towards goals or if patient is discharged from hospital.  The above assessment, treatment plan, treatment alternatives and goals were discussed and mutually agreed upon: by patient and by family  [name] 10/30/2015, 6:23 PM

## 2015-10-31 ENCOUNTER — Inpatient Hospital Stay (HOSPITAL_COMMUNITY): Payer: Medicare Other | Admitting: Physical Therapy

## 2015-10-31 ENCOUNTER — Inpatient Hospital Stay (HOSPITAL_COMMUNITY): Payer: Medicare Other | Admitting: Occupational Therapy

## 2015-10-31 DIAGNOSIS — E871 Hypo-osmolality and hyponatremia: Secondary | ICD-10-CM

## 2015-10-31 DIAGNOSIS — D72829 Elevated white blood cell count, unspecified: Secondary | ICD-10-CM | POA: Clinically undetermined

## 2015-10-31 DIAGNOSIS — D62 Acute posthemorrhagic anemia: Secondary | ICD-10-CM

## 2015-10-31 LAB — URINE CULTURE

## 2015-10-31 NOTE — Progress Notes (Signed)
Physical Therapy Session Note  Patient Details  Name: Mitchell [name] MRN: [id number] Date of Birth: 1934/06/24  Today's Date: 10/31/2015 PT Individual Time: 1417-1530 AND 800-900 PT Individual Time Calculation (min): 73 min AND 60 min    Short Term Goals: Week 1:  PT Short Term Goal 1 (Week 1): Patient will consistent perform stand pivot transfer and sit<>stand with Mod A and RW.  PT Short Term Goal 2 (Week 1): Patient will performed bed mobility with min A.  PT Short Term Goal 3 (Week 1): Patient will  ambulate 22ft with Mod A from PT with RW  PT Short Term Goal 4 (Week 1): Patient will perform car transfer with max A and RW.   Skilled Therapeutic Interventions/Progress Updates:    Patient redcieved supine in bed and agreeable to PT. PT assisted patient to sitting EOB through log roll with mod A, moderate cues for improved use of UE to allow safe transfer with minimal back rotation and use of bed rails.  Lateral scoot to [facility] with drop arm Max A from PT. Constant  Cues for UE placement and improved anterior weight shift to allow increased clearance from seat.  Patient had bowl movement on BSC.   Sit<>stand in bariatric stedy for person hygiene min-Mod A for sit<> stand and PT performed perineal hygiene. Patient was able to remain standing for 2 minutes for personal hygient with BUE support and min cues for prevent increased lumbar flexion.   Transfer to bed with stedy, mod  A From PT. Patient donned shirt with min A from PT to assist getting over head. Sit<>stand performed from bedside in stedy x 4 for donning pants and brief.   Stedy transfer with min A to WC. Patient performed WC mobility for 116ft with supervision A and 1 rest break due to hand weakness.   Patient left in [facility] with call bell within reach.   Session 2.  Patient received supine in bed and agreeable to PT. Supine to sit through log roll with mod A. Mod cues provided for improved use of R UE to push off bed to prevent trunk  rotation to R. Stand pivot transfer completed x 6 throughout therapy today with mod A as well as mod-max cues for improve anterior weight shift, improved safety with 1 UE to assist with control of descent, and maintenance of back precautions. Patient able to recall 1/3 back precautions on this day.   PT instructed patient in [name] mobility for 122ft with supervision A and mod cues for improved force of push through R UE to prevent veer into obstacles on R. .  Sit<>stand at RW with Mod A from PT x 6 throughout treatment with improved success at increased height from mat table. Patient required mod cues to scoot to EOB and proper UE positioning to push from chair with all sit<>stands.    Car transfer with Mod A from PT. Max Cues for LE placement and safety with stand>sit. Patient required multiple attempts to come to standing due to increased fatigue from sit<>stand training prior to Car transfer  Gait training for 15 ft with mod A from PT as well as mod cues for improved AD management, increased step length bilaterally and increased step width to prevent increased bruising on LLE from cam boot.   Seated therex: Hip flexion in available ROM, knee extension, Hip abduction with level 2 tband, Press ups with handles. All therex performed x 10 with min cues for improved ROM, decreased compensation from trunk  and improve hips/head relationship with press up.   Returned to room and left in [facility] with call bell in reach. .       Therapy Documentation Precautions:  Precautions Precautions: Fall, Back Precaution Comments: unable to recall 0/3 back precautions.  Required Braces or Orthoses: Spinal Brace Spinal Brace: Lumbar corset, Applied in sitting position (Pt only able to provide min assist for donning brace.) Restrictions Weight Bearing Restrictions: No Other Position/Activity Restrictions: CAM boot for RLE due to ankle fx, shoe for left foot General:   Vital Signs: Therapy Vitals Temp: 98.3 F  (36.8 C) Temp Source: Oral Pulse Rate: 84 Resp: 16 BP: 130/69 mmHg Patient Position (if appropriate): Lying Oxygen Therapy SpO2: 95 % O2 Device: Not Delivered Pain: 0/10   See Function Navigator for Current Functional Status.   Therapy/Group: Individual Therapy  [name] 10/31/2015, 4:47 PM

## 2015-10-31 NOTE — Progress Notes (Signed)
[facility] PHYSICAL MEDICINE & REHABILITATION     PROGRESS NOTE  Subjective/Complaints:  Pt sitting up in bed eating breakfast.  His wife, who is at bedside, states he had a good first day in therapies.  She states he is doing better overall and was oriented x3 this AM.  She states it is the first night he has slept well in a long time.  She has questions about his blood thinner.   ROS: Denies CP, SOB, N/V/D.  Objective: Vital Signs: Blood pressure 141/82, pulse 85, temperature 98.5 F (36.9 C), temperature source Oral, resp. rate 18, height 6\' 3"  (1.905 m), weight 129.774 kg (286 lb 1.6 oz), SpO2 92 %. No results found.  Recent Labs  10/30/15 0836  WBC 12.3*  HGB 9.5*  HCT 29.7*  PLT 324    Recent Labs  10/30/15 0836  NA 132*  K 4.0  CL 97*  GLUCOSE 119*  BUN 10  CREATININE 0.56*  CALCIUM 8.2*   CBG (last 3)  No results for input(s): GLUCAP in the last 72 hours.  Wt Readings from Last 3 Encounters:  10/29/15 129.774 kg (286 lb 1.6 oz)  10/24/15 122.471 kg (270 lb)  10/16/15 122.471 kg (270 lb)    Physical Exam:  BP 141/82 mmHg  Pulse 85  Temp(Src) 98.5 F (36.9 C) (Oral)  Resp 18  Ht 6\' 3"  (1.905 m)  Wt 129.774 kg (286 lb 1.6 oz)  BMI 35.76 kg/m2  SpO2 92% Constitutional: He appears well-developed and well-nourished.  HENT: Normocephalic and atraumatic.  Eyes: Conjunctivae are normal. Cardiovascular: Normal rate and regular rhythm.  Respiratory: Effort normal and breath sounds normal. No stridor. No respiratory distress. He has no wheezes.  GI: Soft. Bowel sounds are normal. He exhibits no distension. There is no tenderness.  Musculoskeletal: He exhibits edema (1+ edema right ankle. ). He exhibits no tenderness.  Neurological: He is alert.  HOH A&Ox1 (however, wife states he was A&Ox3 earlier) Sensation diminished to light touch b/l feet Motor: B/l UE: 4+/5 grossly proximal to distal RLE: hip flexion, knee extension 4+/5, ankle dorsi/plantarflexion  3/5 LLE: hip flexion, knee extension 4+/5, ankle dorsi/plantar flexion 4+/5  Skin: Skin is warm and dry. No erythema.  Psychiatric: He has a normal mood and affect. Thought content normal. Confused   Assessment/Plan: 1. Functional deficits secondary to lumbar radiculopathy L2/3 and kyphosis with L4/5 spondylolisthesis with hx of right foot drop which require 3+ hours per day of interdisciplinary therapy in a comprehensive inpatient rehab setting. Physiatrist is providing close team supervision and 24 hour management of active medical problems listed below. Physiatrist and rehab team continue to assess barriers to discharge/monitor patient progress toward functional and medical goals.  Function:  Bathing Bathing position   Position: Sitting EOB  Bathing parts Body parts bathed by patient: Right arm, Left arm, Chest, Abdomen Body parts bathed by helper: Right upper leg, Left upper leg, Right lower leg, Left lower leg, Back, Front perineal area, Buttocks  Bathing assist        Upper Body Dressing/Undressing Upper body dressing   What is the patient wearing?: Button up shirt         Button up shirt - Perfomed by patient: Thread/unthread right sleeve, Thread/unthread left sleeve, Button/unbutton shirt Button up shirt - Perfomed by helper: Pull shirt around back    Upper body assist        Lower Body Dressing/Undressing Lower body dressing   What is the patient wearing?: Pants  Pants- Performed by helper: Thread/unthread right pants leg, Thread/unthread left pants leg, Pull pants up/down                      Lower body assist        Toileting Toileting Toileting activity did not occur: Safety/medical concerns        Toileting assist     Transfers Chair/bed transfer   Chair/bed transfer method: Stand pivot Chair/bed transfer assist level: Maximal assist (Pt 25 - 49%/lift and lower) Chair/bed transfer assistive device: Armrests, Environmental health practitioner     Max distance: 24ft Assist level: Moderate assist (Pt 50 - 74%)   Wheelchair   Type: Manual Max wheelchair distance: 175ft Assist Level: Supervision or verbal cues  Cognition Comprehension Comprehension assist level: Understands basic 90% of the time/cues < 10% of the time  Expression Expression assist level: Expresses basic 90% of the time/requires cueing < 10% of the time.  Social Interaction Social Interaction assist level: Interacts appropriately with others with medication or extra time (anti-anxiety, antidepressant).  Problem Solving Problem solving assist level: Solves basic 75 - 89% of the time/requires cueing 10 - 24% of the time  Memory Memory assist level: Recognizes or recalls less than 25% of the time/requires cueing greater than 75% of the time     Medical Problem List and Plan: 1. Gait and cognitive deficits with weakness secondary to lumbar radiculopathy L2/3 and kyphosis with L4/5 spondylolisthesis with hx of right foot drop  Cont CIR 2. DVT Prophylaxis/Anticoagulation: Pharmaceutical: Lovenox added as 5 days post op.  3. Pain Management:  Scheduled ultram qid for now.  Will continue oxycodone prn for now.  4. Mood: LCSW to follow for evaluation and support.  5. Neuropsych: This patient is capable of making decisions on his own behalf. 6. Skin/Wound Care: Monitor wound for healing. Maintain adequate nutrition and hydration status.  7. Fluids/Electrolytes/Nutrition: Monitor I/O.   Hyponatremia: 132 on 7/13 8. HTN: Monitor BP bid. On amlodipine and atenolol.  9. Ureteral stricture: Currently voiding without difficulty Monitor I/O.  10. Ankle fracture: To wear CAM boot when standing/walking. 11. Depression/Anxiety: On cymbalta 12. GERD: On protonix.  13. Asthma: Encourage IS. On dulera for hyperactive airway.  14. Constipation: Increased miralax to bid. 15. CAD: Cont meds 16. Obesity: Body mass index  is 33.75 kg/(m^2), diet and exercise education, encourage weight loss to increase endurance and promote overall health 17. Right ankle fracture: Cont boot 18. Chronic back pain: With severe central canal stenosis and foraminal narrowing: Cont meds 19. Right RTC tear and DJD right shoulder: Cont meds 20. Cognitive deficits with difficulty processing: Likely secondary to narcotics, appears to be improving 21. Leukocytosis  WBCs 12.3 on 7/13  UA neg, Ucx pending  Will cont to monitor 22. ABLA  Hb 9.5 on 7/13  Cont to monitor  LOS (Days) 2 A FACE TO FACE EVALUATION WAS PERFORMED  Ankit [name] 10/31/2015 9:54 AM

## 2015-10-31 NOTE — Progress Notes (Signed)
Occupational Therapy Session Note  Patient Details  Name: [name] MRN: [id number] Date of Birth: 1934-11-21  Today's Date: 10/31/2015 OT Individual Time: 0900-1000 OT Individual Time Calculation (min): 60 min    Short Term Goals: Week 1:  OT Short Term Goal 1 (Week 1): Pt will sit to stand with min-mod A with RW to prepare for LB dressing. OT Short Term Goal 2 (Week 1): Pt will don pants over feet with reacher with min A. OT Short Term Goal 3 (Week 1): Pt will bathe LB with long sponge with mod A. OT Short Term Goal 4 (Week 1): Pt will transfer to [facility] using scoot transfer with min A.  Skilled Therapeutic Interventions/Progress Updates:    1:1 Pt reported getting cleaned up and dressed prior to session. Transitioned down to the gym to focus on transfer training and sit to stands. Pt able to perform sit to stands (muliple times) with varying A from min to mod and sometimes needed multiple time to achieve fully upright position. Max to total VC for sequence and proper hand placement. Pt able to perform sit to stands with more success when pushing up with both hands on mat or arm rests.  Applied red coband to RW as a visual cue to maintain forward weight shift to achieve enough bottom clearance to come up into fully upright posture. Pt able to perform standing transfer with RW with mod A with again max cues to manage RW and know when to safely sit.  Pt able to perform 10 min on Nustep with mulitple rest breaks very 1-2 minutes. Pt only able to tolerate standing for 35 seconds at a time.  Return to room and left in w/c with call bell.   Therapy Documentation Precautions:  Precautions Precautions: Fall, Back Precaution Comments: unable to recall 0/3 back precautions.  Required Braces or Orthoses: Spinal Brace Spinal Brace: Lumbar corset, Applied in sitting position (Pt only able to provide min assist for donning brace.) Restrictions Weight Bearing Restrictions: No Other  Position/Activity Restrictions: CAM boot for RLE due to ankle fx, shoe for left foot General:   Vital Signs: Therapy Vitals Temp: 98.3 F (36.8 C) Temp Source: Oral Pulse Rate: 84 Resp: 16 BP: 130/69 mmHg Patient Position (if appropriate): Lying Oxygen Therapy SpO2: 95 % O2 Device: Not Delivered Pain: Pain Assessment Pain Score: 5/10 relief with frequent rest breaks but able to continue  ADL: ADL ADL Comments: Refer to functional navigator  See Function Navigator for Current Functional Status.   Therapy/Group: Individual Therapy  [name] [facility] 10/31/2015, 3:00 PM

## 2015-11-01 ENCOUNTER — Inpatient Hospital Stay (HOSPITAL_COMMUNITY): Payer: Medicare Other | Admitting: Physical Therapy

## 2015-11-01 ENCOUNTER — Inpatient Hospital Stay (HOSPITAL_COMMUNITY): Payer: Medicare Other | Admitting: Occupational Therapy

## 2015-11-01 NOTE — Progress Notes (Signed)
Occupational Therapy Session Note  Patient Details  Name: Mitchell [name] MRN: [id number] Date of Birth: 1934-07-28  Today's Date: 11/01/2015 OT Individual Time: 1100-1245 OT Individual Time Calculation (min): 105 min    Short Term Goals: Week 1:  OT Short Term Goal 1 (Week 1): Pt will sit to stand with min-mod A with RW to prepare for LB dressing. OT Short Term Goal 2 (Week 1): Pt will don pants over feet with reacher with min A. OT Short Term Goal 3 (Week 1): Pt will bathe LB with long sponge with mod A. OT Short Term Goal 4 (Week 1): Pt will transfer to [facility] using scoot transfer with min A.      Skilled Therapeutic Interventions/Progress Updates:    Pt sitting in wc upon OT arrival.  Addressed standing balance, endurance, bat precautions,bed mobility, upright posture.   Propelled wc about 30 feet and then OT completed the rest of the way to the [facility] gym.  Attempted x3 to go from sit to stand but pt unable with OT providing max assist.  Pt reported my legs are just wrore out today.  Wife assisted as well and still unable to stand.   Performed UE AROM using checkers game.  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  Pt taken to room and transferred to bed with scooting technique and max assist.  Pt scooted from bed to [facility].  Did push up in order to doff pants with wife doffing pants, while OT assisted with sit to stand. Pt transferred back to bed.  Went from sit to supine with mod assist. And left with all needs in reach and wife present.    Therapy Documentation Precautions:  Precautions Precautions: Fall, Back Precaution Comments: unable to recall 0/3 back precautions.  Required Braces or Orthoses: Spinal Brace Spinal Brace: Lumbar corset, Applied in sitting position (Pt only able to provide min assist for donning brace.) Restrictions Weight Bearing Restrictions: No Other Position/Activity Restrictions: CAM boot for RLE due to ankle fx, shoe for left foot     Vital Signs: Therapy Vitals Temp: 98.3 F (36.8 C) Temp Source: Oral Pulse Rate: 86 Resp: 20 BP: (!) 146/60 mmHg Patient Position (if appropriate): Sitting Oxygen Therapy SpO2: 97 % O2 Device: Not Delivered Pain:  5/10 and reduced to 4/10 after pain pills         See Function Navigator for Current Functional Status.   Therapy/Group: Individual Therapy  Lisa Roca 11/01/2015, 5:03 PM

## 2015-11-01 NOTE — Progress Notes (Signed)
Physical Therapy Session Note  Patient Details  Name: Mitchell [name] MRN: [id number] Date of Birth: [birth year]  Today's Date: 11/01/2015 PT Individual Time: [id number] AND [phone] PT Individual Time Calculation (min): 29 min AND 61 min    Short Term Goals: Week 1:  PT Short Term Goal 1 (Week 1): Patient will consistent perform stand pivot transfer and sit<>stand with Mod A and RW.  PT Short Term Goal 2 (Week 1): Patient will performed bed mobility with min A.  PT Short Term Goal 3 (Week 1): Patient will  ambulate 65ft with Mod A from PT with RW  PT Short Term Goal 4 (Week 1): Patient will perform car transfer with max A and RW.   Skilled Therapeutic Interventions/Progress Updates:    Session 1 Patient received sitting on BSC for bowl movement with trade off from NT.   Patient performed sit>stand with mod A for perineal hygiene by PT. Stand pivot transfer to bed from [facility] with mod A from PT. PT assisted patient to don underpants and pants with max A using sit<>stand x 2 with min A from elevated bed height. Patient noted to support BLE against against bed for sit<>stand.   Stand pivot transfer to WC from elevated bed height with RW mod A from PT with cues for AD management and increased safety with use of UE to control descent  PT instructued patient in [facility] mobility for 175ft with supervision A and min cues for improved use of RE to turn to L and maintain straight trajectory.   Pre-gait activities training with emphasis on sit<>stand techinque. Press ups to partial stand with rails on WC x 6 with decreased gluteal clearance on the last to repetitions.   Sit<>stand with mod A from PT x 2 with max cues for anterior weight shift and proper use of UE to  Assist with ascent and descent to chair. Patient demonstrated significant posterior lean once in standing position, requiring mod-max A to prevent posterior LOB as well as mod cues for improved weight bearing through balls of feet and through hand  to allow support from RW.   Patient returned to room and left in [facility] with call bell in reach.   Session 2.   Patient received supine in bed and agreeable to PT. Supine>sit through [employer] with mod A and Max cues for UE placement to improve independence with transfer.  Patient instructed in sit<>stand with RW from elevated height x 5 with mod A from PT and mod cues for UE placement on bed, anterior weight shift, and decreased support of BLE on bed come to standing position. Standing tolerance with weight shift L and R for 2 minutes with mod A from PT to prevent posterior LOB x 4. Patient unable to recognize or correct posterior weight shift until COM beyond BOS, requiring assistance to prevent fall.   Stand pivot transfer to [facility] from bed at slightly elevated height. Patient demonstrated continuous posterior lean and had to be returned to [name] of bed with Max A from PT to prevent fall. Stand pivot transfer then performed with mod-max A and max cues for gait pattern and AD management.   Patient left sitting in [facility] with call bell within reach.   Therapy Documentation Precautions:  Precautions Precautions: Fall, Back Precaution Comments: unable to recall 0/3 back precautions.  Required Braces or Orthoses: Spinal Brace Spinal Brace: Lumbar corset, Applied in sitting position (Pt only able to provide min assist for donning brace.) Restrictions Weight Bearing Restrictions:  No Other Position/Activity Restrictions: CAM boot for RLE due to ankle fx, shoe for left foot General:   Vital Signs: Therapy Vitals Temp: 98.3 F (36.8 C) Temp Source: Oral Pulse Rate: 86 Resp: 20 BP: (!) 146/60 mmHg Patient Position (if appropriate): Sitting Oxygen Therapy SpO2: 97 % O2 Device: Not Delivered Pain: 1/10   See Function Navigator for Current Functional Status.   Therapy/Group: Individual Therapy  [name] 11/01/2015, 5:28 PM

## 2015-11-01 NOTE — Progress Notes (Signed)
Patient ID: Mitchell [name], male   DOB: 1934/12/25, 81 y.o.   MRN: [id number]  11/01/15.  [place] PHYSICAL MEDICINE & REHABILITATION     PROGRESS NOTE  81 year old patient admitted for CIR with Gait and cognitive deficits with weakness secondary to lumbar radiculopathy L2/3 and kyphosis with L4/5 spondylolisthesis with hx of right foot drop  Subjective/Complaints:  Comfortable night.  Wife at bedside.  No new concerns or complaints.   ROS: Denies CP, SOB, N/V/D.  Past Medical History  Diagnosis Date  . Coronary artery disease     2D ECHO, 08/24/2011 - EF >55%; NUCLEAR STRESS TEST, 04/21/2010 - mild ischemia in Basal Inferolateral and Mid Inferolateral regions, post-stress EF 56%,   . Arthritis   . Melanoma ([place])     l neck  . Adenomatous polyp   . Shortness of breath   . Prostatitis   . Dysrhythmia     atrial fibrilation  . Pneumonia   . Spinal stenosis   . Atrial flutter with rapid ventricular response ([place]) 05/25/2011  . SOB (shortness of breath), secondary to SOB 05/25/2011  . LV dysfunction, EF by ECHO 40-45% 04/29/11 05/25/2011  . Acute on chronic systolic heart failure, usually asymptomatic but with tachycardia HF became acute now resolved 05/26/2011  . Sleep apnea     no longer has since loss of 70 pounds  . Anxiety   . GERD (gastroesophageal reflux disease)     takes Pantoprazole daily  . Constipation     takes Miralax daily as needed  . Depression     takes Cymbalta daily  . Hypertension     takes Amlodipine and Atenolol daily  . Asthma     Symbicort daily as needed  . Hyperlipidemia     takes Zetia daily  . CHF, acute, mild secondary to EF 40-45% an a. flutter with RVR 05/25/2011    takes Furosemide daily as needed  . Ankle fracture     right  . History of blood transfusion 53  yrs ago    got hives     Objective: Vital Signs: Blood pressure 120/60, pulse 81, temperature 97.9 F (36.6 C), temperature source Oral, resp. rate 18, height 6\' 3"  (1.905 m), weight 286  lb 1.6 oz (129.774 kg), SpO2 91 %. No results found.  Recent Labs  10/30/15 0836  WBC 12.3*  HGB 9.5*  HCT 29.7*  PLT 324    Recent Labs  10/30/15 0836  NA 132*  K 4.0  CL 97*  GLUCOSE 119*  BUN 10  CREATININE 0.56*  CALCIUM 8.2*      Wt Readings from Last 3 Encounters:  10/29/15 286 lb 1.6 oz (129.774 kg)  10/24/15 270 lb (122.471 kg)  10/16/15 270 lb (122.471 kg)    Physical Exam:  BP 120/60 mmHg  Pulse 81  Temp(Src) 97.9 F (36.6 C) (Oral)  Resp 18  Ht 6\' 3"  (1.905 m)  Wt 286 lb 1.6 oz (129.774 kg)  BMI 35.76 kg/m2  SpO2 91% Constitutional: He appears well-developed and well-nourished.  Gen.- Obese, no distress HENT: Normocephalic and atraumatic.  Eyes: Conjunctivae are normal. Cardiovascular: Normal rate and regular rhythm.  Respiratory: Effort normal and breath sounds normal. No stridor. No respiratory distress. He has no wheezes.  GI: Soft. Bowel sounds are normal. He exhibits no distension. There is no tenderness.  Musculoskeletal: He exhibits edema (1+ edema right ankle. ). He exhibits no tenderness.  Neurological: He is alert.  HOH  Sensation diminished to light  touch b/l feet Motor: B/l UE: 4+/5 grossly proximal to distal RLE: hip flexion, knee extension 4+/5, ankle dorsi/plantarflexion 3/5 LLE: hip flexion, knee extension 4+/5, ankle dorsi/plantar flexion 4+/5  Skin: Skin is warm and dry. No erythema.  Psychiatric: He has a normal mood and affect. Thought content normal. Confused Extremities- right foot edema (chronic); SCDs in place  BP Readings from Last 3 Encounters:  11/01/15 120/60  10/29/15 119/70  10/16/15 132/96     Medical Problem List and Plan: 1. Gait and cognitive deficits with weakness secondary to lumbar radiculopathy L2/3 and kyphosis with L4/5 spondylolisthesis with hx of right foot drop  Cont CIR 2. DVT Prophylaxis/Anticoagulation: Pharmaceutical: Lovenox added  3. Pain Management:  Scheduled  ultram qid for now.  Will continue oxycodone prn for now.   4. Skin/Wound Care: Monitor wound for healing. Maintain adequate nutrition and hydration status.  5.  Fluids/Electrolytes/Nutrition: Monitor I/O.   Hyponatremia: 132 on 7/13 6. HTN: Monitor BP bid. On amlodipine and atenolol.  7. Ureteral stricture: Currently voiding without difficulty Monitor I/O.  8. Ankle fracture: To wear CAM boot when standing/walking.   9. Constipation: Increased miralax to bid.   10. Right ankle fracture: Cont boot 11. Chronic back pain: With severe central canal stenosis and foraminal narrowing: Cont meds   12. Cognitive deficits with difficulty processing: Likely secondary to narcotics, appears to be improving 13. Leukocytosis  WBCs 12.3 on 7/13  UA neg, Ucx multiple species  Will cont to monitor 14. ABLA  Hb 9.5 on 7/13  Cont to monitor  LOS (Days) 3 A FACE TO FACE EVALUATION WAS PERFORMED  Nyoka Cowden 11/01/2015 10:12 AM

## 2015-11-02 LAB — CBC WITH DIFFERENTIAL/PLATELET
Basophils Absolute: 0 10*3/uL (ref 0.0–0.1)
Basophils Relative: 0 %
Eosinophils Absolute: 0.2 10*3/uL (ref 0.0–0.7)
Eosinophils Relative: 1 %
HCT: 28.3 % — ABNORMAL LOW (ref 39.0–52.0)
Hemoglobin: 9 g/dL — ABNORMAL LOW (ref 13.0–17.0)
Lymphocytes Relative: 13 %
Lymphs Abs: 1.8 10*3/uL (ref 0.7–4.0)
MCH: 30.8 pg (ref 26.0–34.0)
MCHC: 31.8 g/dL (ref 30.0–36.0)
MCV: 96.9 fL (ref 78.0–100.0)
Monocytes Absolute: 1.7 10*3/uL — ABNORMAL HIGH (ref 0.1–1.0)
Monocytes Relative: 12 %
Neutro Abs: 10.7 10*3/uL — ABNORMAL HIGH (ref 1.7–7.7)
Neutrophils Relative %: 74 %
Platelets: 427 10*3/uL — ABNORMAL HIGH (ref 150–400)
RBC: 2.92 MIL/uL — ABNORMAL LOW (ref 4.22–5.81)
RDW: 13.7 % (ref 11.5–15.5)
WBC: 14.4 10*3/uL — ABNORMAL HIGH (ref 4.0–10.5)

## 2015-11-02 NOTE — Progress Notes (Signed)
Wife requested to hold scheduled HS ultram. She feels related to confusion, but reports patient took PTA. Patient A & O to self only. At 2350, patient agitated, cussing wanting to go home. Multi attempts to get OOB without assistance. Attempts to reorient unsuccessful. Confusion/behavior progressively gets worse during evening, per wife. PRN trazodone 25mg  given at 0053 with wife's approval. Slept good rest of night. Dressing to back this morning saturated, incision macerated with area at proximal end oozing bloody drainage. Dr. Inda Merlin made aware R/T incision. Patrici Ranks A

## 2015-11-02 NOTE — Progress Notes (Signed)
11/02/15 1100 nursing Dr. Burnice Logan ordered to give the group of Dr. Vertell Limber a call re: drainage  surgical wound on patients back; RN was able to talk to Dr. [name] Ditty on call for Dr. Vertell Limber and he ordered to reinforce the dressing.

## 2015-11-02 NOTE — Progress Notes (Signed)
Patient ID: Mitchell [name], male   DOB: 04/21/1934, 81 y.o.   MRN: [id number]  Patient ID: Mitchell [name], male   DOB: Nov 03, 1934, 81 y.o.   MRN: [id number]  11/02/15.  [place] PHYSICAL MEDICINE & REHABILITATION     PROGRESS NOTE  81 year old patient admitted for CIR with Gait and cognitive deficits with weakness secondary to lumbar radiculopathy L2/3 and kyphosis with L4/5 spondylolisthesis with hx of right foot drop  Subjective/Complaints:  Comfortable night.  Wife at bedside.  Patient has developed increasing drainage from his lumbar incision.  No fever.  No localized lumbar pain.  WBC count slightly elevated at 12.3  on July 13.  ROS: Denies CP, SOB, N/V/D.  Past Medical History  Diagnosis Date  . Coronary artery disease     2D ECHO, 08/24/2011 - EF >55%; NUCLEAR STRESS TEST, 04/21/2010 - mild ischemia in Basal Inferolateral and Mid Inferolateral regions, post-stress EF 56%,   . Arthritis   . Melanoma ([place])     l neck  . Adenomatous polyp   . Shortness of breath   . Prostatitis   . Dysrhythmia     atrial fibrilation  . Pneumonia   . Spinal stenosis   . Atrial flutter with rapid ventricular response ([place]) 05/25/2011  . SOB (shortness of breath), secondary to SOB 05/25/2011  . LV dysfunction, EF by ECHO 40-45% 04/29/11 05/25/2011  . Acute on chronic systolic heart failure, usually asymptomatic but with tachycardia HF became acute now resolved 05/26/2011  . Sleep apnea     no longer has since loss of 70 pounds  . Anxiety   . GERD (gastroesophageal reflux disease)     takes Pantoprazole daily  . Constipation     takes Miralax daily as needed  . Depression     takes Cymbalta daily  . Hypertension     takes Amlodipine and Atenolol daily  . Asthma     Symbicort daily as needed  . Hyperlipidemia     takes Zetia daily  . CHF, acute, mild secondary to EF 40-45% an a. flutter with RVR 05/25/2011    takes Furosemide daily as needed  . Ankle fracture     right  . History of blood  transfusion 53  yrs ago    got hives     Objective: Vital Signs: Blood pressure 126/71, pulse 75, temperature 98 F (36.7 C), temperature source Oral, resp. rate 18, height 6\' 3"  (1.905 m), weight 286 lb 1.6 oz (129.774 kg), SpO2 97 %. No results found. No results for input(s): WBC, HGB, HCT, PLT in the last 72 hours. No results for input(s): NA, K, CL, GLUCOSE, BUN, CREATININE, CALCIUM in the last 72 hours.  Invalid input(s): CO    Wt Readings from Last 3 Encounters:  10/29/15 286 lb 1.6 oz (129.774 kg)  10/24/15 270 lb (122.471 kg)  10/16/15 270 lb (122.471 kg)    Physical Exam:  BP 126/71 mmHg  Pulse 75  Temp(Src) 98 F (36.7 C) (Oral)  Resp 18  Ht 6\' 3"  (1.905 m)  Wt 286 lb 1.6 oz (129.774 kg)  BMI 35.76 kg/m2  SpO2 97% Constitutional: He appears well-developed and well-nourished.  Gen.- Obese, no distress HENT: Normocephalic and atraumatic.  Eyes: Conjunctivae are normal. Cardiovascular: Normal rate and regular rhythm.  Respiratory: Effort normal and breath sounds normal. No stridor. No respiratory distress. He has no wheezes.  GI: Soft. Bowel sounds are normal. He exhibits no distension. There is no tenderness.  Musculoskeletal: He  exhibits edema (1+ edema right ankle. ). He exhibits no tenderness.  Neurological: He is alert.  HOH  Sensation diminished to light touch b/l feet Motor: B/l UE: 4+/5 grossly proximal to distal RLE: hip flexion, knee extension 4+/5, ankle dorsi/plantarflexion 3/5 LLE: hip flexion, knee extension 4+/5, ankle dorsi/plantar flexion 4+/5  Skin:  Serosanguineous drainage noted from superior aspect of the lumbar incision.  No surrounding fluctuance or erythema or local tenderness.   Psychiatric: He has a normal mood and affect. Thought content normal. Confused Extremities- right foot edema (chronic); SCDs in place  BP Readings from Last 3 Encounters:  11/02/15 126/71  10/29/15 119/70  10/16/15 132/96     Medical Problem List  and Plan: 1. Gait and cognitive deficits with weakness secondary to lumbar radiculopathy L2/3 and kyphosis with L4/5 spondylolisthesis with hx of right foot drop  Cont CIR 2. DVT Prophylaxis/Anticoagulation: Pharmaceutical: Lovenox added  3. Pain Management:  Scheduled ultram qid for now.  Will continue oxycodone prn for now.   4. Skin/Wound Care: Patient has developed a wound complication with slight disruption of closure with copious serosanguineous drainage from seroma/hematoma.  Does not appear to be overtly infected.  Will check a wound culture, but likely will be polymicrobial.  Will alert surgery due to risk of dehiscence  5.  Fluids/Electrolytes/Nutrition: Monitor I/O.   Hyponatremia: 132 on 7/13 6. HTN: Monitor BP bid. On amlodipine and atenolol.  7. Ureteral stricture: Currently voiding without difficulty Monitor I/O.  8. Ankle fracture: To wear CAM boot when standing/walking.   9. Constipation: Increased miralax to bid.   10. Right ankle fracture: Cont boot 11. Chronic back pain: With severe central canal stenosis and foraminal narrowing: Cont meds   12. Cognitive deficits with difficulty processing: Likely secondary to narcotics, appears to be improving 13. Leukocytosis  WBCs 12.3 on 7/13  UA neg, Ucx multiple species  Will cont to monitor 14. ABLA  Hb 9.5 on 7/13  Cont to monitor  LOS (Days) 4 A FACE TO FACE EVALUATION WAS PERFORMED  Nyoka Cowden 11/02/2015 9:11 AM

## 2015-11-03 ENCOUNTER — Inpatient Hospital Stay (HOSPITAL_COMMUNITY): Payer: Medicare Other | Admitting: Occupational Therapy

## 2015-11-03 ENCOUNTER — Inpatient Hospital Stay (HOSPITAL_COMMUNITY): Payer: Medicare Other | Admitting: Physical Therapy

## 2015-11-03 DIAGNOSIS — F068 Other specified mental disorders due to known physiological condition: Secondary | ICD-10-CM

## 2015-11-03 DIAGNOSIS — T8131XA Disruption of external operation (surgical) wound, not elsewhere classified, initial encounter: Secondary | ICD-10-CM | POA: Diagnosis not present

## 2015-11-03 DIAGNOSIS — R410 Disorientation, unspecified: Secondary | ICD-10-CM | POA: Insufficient documentation

## 2015-11-03 DIAGNOSIS — R5381 Other malaise: Secondary | ICD-10-CM

## 2015-11-03 LAB — BASIC METABOLIC PANEL
Anion gap: 9 (ref 5–15)
BUN: 10 mg/dL (ref 6–20)
CO2: 27 mmol/L (ref 22–32)
Calcium: 8.5 mg/dL — ABNORMAL LOW (ref 8.9–10.3)
Chloride: 97 mmol/L — ABNORMAL LOW (ref 101–111)
Creatinine, Ser: 0.65 mg/dL (ref 0.61–1.24)
GFR calc Af Amer: >60 mL/min (ref >60)
GFR calc non Af Amer: >60 mL/min (ref >60)
Glucose, Bld: 181 mg/dL — ABNORMAL HIGH (ref 65–99)
Potassium: 4.3 mmol/L (ref 3.5–5.1)
Sodium: 133 mmol/L — ABNORMAL LOW (ref 135–145)

## 2015-11-03 LAB — CBC WITH DIFFERENTIAL/PLATELET
Basophils Absolute: 0 10*3/uL (ref 0.0–0.1)
Basophils Relative: 0 %
Eosinophils Absolute: 0.2 10*3/uL (ref 0.0–0.7)
Eosinophils Relative: 1 %
HCT: 32.9 % — ABNORMAL LOW (ref 39.0–52.0)
Hemoglobin: 10.4 g/dL — ABNORMAL LOW (ref 13.0–17.0)
Lymphocytes Relative: 11 %
Lymphs Abs: 1.7 10*3/uL (ref 0.7–4.0)
MCH: 30.9 pg (ref 26.0–34.0)
MCHC: 31.6 g/dL (ref 30.0–36.0)
MCV: 97.6 fL (ref 78.0–100.0)
Monocytes Absolute: 1.2 10*3/uL — ABNORMAL HIGH (ref 0.1–1.0)
Monocytes Relative: 8 %
Neutro Abs: 12.1 10*3/uL — ABNORMAL HIGH (ref 1.7–7.7)
Neutrophils Relative %: 80 %
Platelets: 523 10*3/uL — ABNORMAL HIGH (ref 150–400)
RBC: 3.37 MIL/uL — ABNORMAL LOW (ref 4.22–5.81)
RDW: 14.1 % (ref 11.5–15.5)
WBC: 15.2 10*3/uL — ABNORMAL HIGH (ref 4.0–10.5)

## 2015-11-03 MED ORDER — CEPHALEXIN 250 MG PO CAPS: 250.0000 mg | ORAL_CAPSULE | Freq: Three times a day (TID) | ORAL | Status: DC

## 2015-11-03 MED ORDER — VANCOMYCIN HCL 10 G IV SOLR
1250.0000 mg | Freq: Two times a day (BID) | INTRAVENOUS | Status: DC
  Administered 2015-11-04 – 2015-11-06 (×5): 1250 mg via INTRAVENOUS
  Filled 2015-11-03 (×7): qty 1250

## 2015-11-03 MED ORDER — ATENOLOL 12.5 MG HALF TABLET
12.5000 mg | ORAL_TABLET | Freq: Two times a day (BID) | ORAL | Status: DC
  Administered 2015-11-04 – 2015-11-22 (×36): 12.5 mg via ORAL
  Filled 2015-11-03 (×38): qty 1

## 2015-11-03 MED ORDER — DEXTROSE 5 % IV SOLN
2.0000 g | Freq: Three times a day (TID) | INTRAVENOUS | Status: DC
  Filled 2015-11-03 (×3): qty 2

## 2015-11-03 MED ORDER — HYDROCODONE-ACETAMINOPHEN 5-325 MG PO TABS
1.0000 | ORAL_TABLET | Freq: Two times a day (BID) | ORAL | Status: DC | PRN
  Administered 2015-11-04: 1 via ORAL
  Filled 2015-11-03: qty 1

## 2015-11-03 MED ORDER — DEXTROSE 5 % IV SOLN
2.0000 g | Freq: Three times a day (TID) | INTRAVENOUS | Status: DC
  Administered 2015-11-03 – 2015-11-06 (×9): 2 g via INTRAVENOUS
  Filled 2015-11-03 (×11): qty 2

## 2015-11-03 MED ORDER — HYDROCORTISONE 1 % EX CREA
1.0000 "application " | TOPICAL_CREAM | Freq: Two times a day (BID) | CUTANEOUS | Status: DC | PRN
  Filled 2015-11-03: qty 28

## 2015-11-03 MED ORDER — VANCOMYCIN HCL 10 G IV SOLR
2000.0000 mg | Freq: Once | INTRAVENOUS | Status: AC
  Administered 2015-11-03: 2000 mg via INTRAVENOUS
  Filled 2015-11-03 (×2): qty 2000

## 2015-11-03 NOTE — Progress Notes (Signed)
Patient ID: Mitchell [name], male   DOB: January 18, 1935, 81 y.o.   MRN: [id number] Pt awake, conversant, jovial.  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 Drsg saturated with blood-tinged serosanguineous drainage. Incision with few areas of yellow slough. Mild surrounding erythema lower half of incision. No swelling or heat. Afebrile. WBC up to 15.2 today. Wound Cx pseudomonas. Vanc & Cefipime initiated earlier today.  Will continue drsg changes as needed throughout the day. Continue working with Therapies.   Verdis Prime RN BSN

## 2015-11-03 NOTE — Progress Notes (Signed)
ANTIBIOTIC CONSULT NOTE - INITIAL  Pharmacy Consult for vancomycin and cefepime Indication: post op wound infection s/p lumbar stenosis surgery  Allergies  Allergen Reactions  . Statins Other (See Comments)    Leg pain  . Adhesive [Tape] Itching and Rash    Please use "paper" tape    Patient Measurements: Height: 6\' 3"  (190.5 cm) Weight: 286 lb 1.6 oz (129.774 kg) IBW/kg (Calculated) : 84.5 Adjusted Body Weight:   Vital Signs: Temp: 97.7 F (36.5 C) (07/17 0617) Temp Source: Axillary (07/17 1100) BP: 100/60 mmHg (07/17 1100) Pulse Rate: 76 (07/17 0617) Intake/Output from previous day: 07/16 0701 - 07/17 0700 In: 720 [P.O.:720] Out: 1135 [Urine:1135] Intake/Output from this shift: Total I/O In: 120 [P.O.:120] Out: -   Labs:  Recent Labs  11/02/15 0954 11/03/15 1114  WBC 14.4* 15.2*  HGB 9.0* 10.4*  PLT 427* 523*  CREATININE  --  0.65   Estimated Creatinine Clearance: 108.7 mL/min (by C-G formula based on Cr of 0.65). No results for input(s): VANCOTROUGH, VANCOPEAK, VANCORANDOM, GENTTROUGH, GENTPEAK, GENTRANDOM, TOBRATROUGH, TOBRAPEAK, TOBRARND, AMIKACINPEAK, AMIKACINTROU, AMIKACIN in the last 72 hours.   Microbiology: Recent Results (from the past 720 hour(s))  Surgical pcr screen     Status: None   Collection Time: 10/16/15  1:11 PM  Result Value Ref Range Status   MRSA, PCR NEGATIVE NEGATIVE Final   Staphylococcus aureus NEGATIVE NEGATIVE Final    Comment:        The Xpert SA Assay (FDA approved for NASAL specimens in patients over 92 years of age), is one component of a comprehensive surveillance program.  Test performance has been validated by [name] for patients greater than or equal to 61 year old. It is not intended to diagnose infection nor to guide or monitor treatment.   Urine culture     Status: Abnormal   Collection Time: 10/30/15  6:02 PM  Result Value Ref Range Status   Specimen Description URINE, CLEAN CATCH  Final   Special  Requests NONE  Final   Culture MULTIPLE SPECIES PRESENT, SUGGEST RECOLLECTION (A)  Final   Report Status 10/31/2015 FINAL  Final  Aerobic Culture (superficial specimen)     Status: None (Preliminary result)   Collection Time: 11/02/15 11:19 AM  Result Value Ref Range Status   Specimen Description WOUND BACK  Final   Special Requests Normal  Final   Gram Stain   Final    MODERATE WBC PRESENT, PREDOMINANTLY PMN FEW GRAM NEGATIVE RODS RARE GRAM POSITIVE COCCI IN CLUSTERS    Culture ABUNDANT PSEUDOMONAS AERUGINOSA  Final   Report Status PENDING  Incomplete    Medical History: Past Medical History  Diagnosis Date  . Coronary artery disease     2D ECHO, 08/24/2011 - EF >55%; NUCLEAR STRESS TEST, 04/21/2010 - mild ischemia in Basal Inferolateral and Mid Inferolateral regions, post-stress EF 56%,   . Arthritis   . Melanoma ([place])     l neck  . Adenomatous polyp   . Shortness of breath   . Prostatitis   . Dysrhythmia     atrial fibrilation  . Pneumonia   . Spinal stenosis   . Atrial flutter with rapid ventricular response ([place]) 05/25/2011  . SOB (shortness of breath), secondary to SOB 05/25/2011  . LV dysfunction, EF by ECHO 40-45% 04/29/11 05/25/2011  . Acute on chronic systolic heart failure, usually asymptomatic but with tachycardia HF became acute now resolved 05/26/2011  . Sleep apnea     no longer has  since loss of 70 pounds  . Anxiety   . GERD (gastroesophageal reflux disease)     takes Pantoprazole daily  . Constipation     takes Miralax daily as needed  . Depression     takes Cymbalta daily  . Hypertension     takes Amlodipine and Atenolol daily  . Asthma     Symbicort daily as needed  . Hyperlipidemia     takes Zetia daily  . CHF, acute, mild secondary to EF 40-45% an a. flutter with RVR 05/25/2011    takes Furosemide daily as needed  . Ankle fracture     right  . History of blood transfusion 53  yrs ago    got hives    Medications:  Scheduled:  . amLODipine  5 mg Oral  Daily  . [START ON 11/04/2015] atenolol  12.5 mg Oral BID  . calcium carbonate  1,250 mg Oral BID WC  . docusate sodium  100 mg Oral BID  . DULoxetine  30 mg Oral Daily  . enoxaparin (LOVENOX) injection  40 mg Subcutaneous Q24H  . ezetimibe  10 mg Oral Daily  . famotidine  20 mg Oral BID  . furosemide  20 mg Oral QODAY  . mometasone-formoterol  2 puff Inhalation BID  . pantoprazole  40 mg Oral Daily  . polyethylene glycol  17 g Oral BID  . traMADol  50 mg Oral TID WC & HS   Infusions:   Assessment: 81 yo male had neurosurgery for lumbar stenosis and now with wound infection s/p I&D over weekend starting on vancomycin and cefepime.   07/16: back wound cx > psuedmonas  07/17 Cefepime >> 07/17 Vanc >>   Goal of Therapy:  Vancomycin trough level 15-20 mcg/ml  Plan:  - Vancomycin 2g iv x1, then 1250 mg iv q12h - Cefepime 2g iv q8h - follow up on ABX plan   Orazio Weller, Tsz-Yin 11/03/2015,12:16 PM

## 2015-11-03 NOTE — Progress Notes (Signed)
Occupational Therapy Session Note  Patient Details  Name: Mitchell [name] MRN: [id number] Date of Birth: August 07, 1934  Today's Date: 11/03/2015 OT Individual Time: 1450-1535 OT Individual Time Calculation (min): 45 min   Short Term Goals: Week 1:  OT Short Term Goal 1 (Week 1): Pt will sit to stand with min-mod A with RW to prepare for LB dressing. OT Short Term Goal 2 (Week 1): Pt will don pants over feet with reacher with min A. OT Short Term Goal 3 (Week 1): Pt will bathe LB with long sponge with mod A. OT Short Term Goal 4 (Week 1): Pt will transfer to [name] using scoot transfer with min A.  Skilled Therapeutic Interventions/Progress Updates:  Patient found in w/c in hallway with PT. Pt disoriented to place, continuously stating [place]. Therapist redirected pt and explained that he was in the hospital and in rehab/therapy after having back surgery. 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 No matter how much therapist would try to redirect patient and reorient him, he kept referring back to cattle. Pt worked on self propulsion of w/c from room to therapy gym. Pt engaged in sit to/from stands, stand pivot transfer using RW, dynamic standing balance/tolerance/endurance, education on back precautions, overall activity tolerance/endurance while in therapy gym. Therapist assisted pt back to room and assisted pt back to bed per wife request. Wife and therapist giving patient cues for log roll technique in order to adhere to back precautions. Left pt supine in bed with all needs within reach and bed alarm set.    Therapy Documentation Precautions:  Precautions Precautions: Fall, Back Precaution Comments: unable to recall 0/3 back precautions.  Required Braces or Orthoses: Spinal Brace Spinal Brace: Lumbar corset, Applied in sitting position (Pt only able to provide min assist for donning brace.) Restrictions Weight Bearing  Restrictions: No Other Position/Activity Restrictions: CAM boot for RLE due to ankle fx, shoe for left foot  Vital Signs: Therapy Vitals Temp: 98.6 F (37 C) Temp Source: Oral Pulse Rate: 84 BP: 94/61 mmHg Patient Position (if appropriate): Sitting  Oxygen Therapy SpO2: 93 % O2 Device: Not Delivered  See Function Navigator for Current Functional Status.  Therapy/Group: Individual Therapy  Chrys Racer , MS, OTR/L, CLT  11/03/2015, 4:03 PM

## 2015-11-03 NOTE — Progress Notes (Signed)
Physical Therapy Session Note  Patient Details  Name: Mitchell [name] MRN: [id number] Date of Birth: [birth year]  Today's Date: 11/03/2015 PT Individual Time: [phone] and [id number] PT Individual Time Calculation (min): 60 min and 32 min  Short Term Goals: Week 1:  PT Short Term Goal 1 (Week 1): Patient will consistent perform stand pivot transfer and sit<>stand with Mod A and RW.  PT Short Term Goal 2 (Week 1): Patient will performed bed mobility with min A.  PT Short Term Goal 3 (Week 1): Patient will  ambulate 31ft with Mod A from PT with RW  PT Short Term Goal 4 (Week 1): Patient will perform car transfer with max A and RW.   Skilled Therapeutic Interventions/Progress Updates:    Treatment 1: Pt received in bed with wife, Webb Silversmith, present for session. Pt denied c/o pain & agreeable to treatment. Pt's wife reported he needed a new brief; pt able to roll L<>R with min A, bed rails & cuing for log rolling to allow PT to don new brief total A. PT observed significant drainage from back incision & RN Notified; RN changed dressing. Pt able to transfer supine>sit with mod A & maximum cuing for log rolling & sidelying>sit. Provided total A for donning ted hose, shoe, cam boot & back brace during session; educated wife on donning back brace. Pt able to complete sit>stand from elevated surface with Mod A & maximum cuing for anterior weight shift. Educated pt in stand pivot bed>w/c with mod A and cuing for sequencing; pt impulsively transferred to sitting even with cuing from PT to square up to w/c, reach back with BUE to transfer to sitting. Transported pt out into hallway & pt able to propel w/c x 30 ft with BUE for cardiovascular endurance training; educated pt on hand positioning on drive wheels. In gym pt set up in parallel bars & transferred sit<>Stand with Mod A by pulling up on bars; pt able to tolerate standing x 1 minute with steady A. Pt noted minimal back pain & SOB with standing; educated pt on  pursed lip breathing with pt able to demonstrate fair carryover. Pt impulsive with stand>sit transfer even with maximum cuing & education on need to notify PT of need to sit to increase safety. Pt transported back to room & left in w/c with QRB in place, wife present & all needs within reach. Educated pt & wife on safety plan, use of QRB, and safety precautions. Pt not oriented to location, time, situation & throughout session PT reoriented pt.  Treatment 2: Pt received in w/c & agreeable to PT, noting pain in back & hips but unable to rate. Pt unaware of [place] ([place]") & throughout session PT reoriented pt on location & situation, with cues to use visual aides around him. Pt transported to gym via w/c total A for time management. Pt with significant difficulty following 1 step commands throughout session. Gait training x 8 ft + 12 feet in parallel bars with min A & mod A for sit<>stand. Pt with difficulty clearing & advancing RLE 2/2 CAM boot. Pt required maximum multimodal cuing for sequencing for transfers & turns in parallel bars. During session, focused on anterior weight shifts to assist with sit>stand transfers and pursed lip breathing as pt reported SOB with activity. At end of session pt left in handoff with OT.   Therapy Documentation Precautions:  Precautions Precautions: Fall, Back Precaution Comments: unable to recall 0/3 back precautions.  Required Braces or Orthoses: Spinal  Brace Spinal Brace: Lumbar corset, Applied in sitting position (Pt only able to provide min assist for donning brace.) Restrictions Weight Bearing Restrictions: No Other Position/Activity Restrictions: CAM boot for RLE due to ankle fx, shoe for left foot  Pain: Pain Assessment Pain Assessment: No/denies pain   See Function Navigator for Current Functional Status.   Therapy/Group: Individual Therapy  [name] 11/03/2015, 7:50 AM

## 2015-11-03 NOTE — Progress Notes (Signed)
Patient ID: Mitchell [name], male   DOB: 12-18-1934, 81 y.o.   MRN: [id number] Report of incisional drainage beginning over weekend, WBC 14.4 yesterday, wound Cx gram - rods, gram + cocci in clusters.  Will visit this afternoon, but Dr. Vertell Limber requests to proceed with ID consult & IVAB Vanc & Ceftriaxone per pharmacy. Orders entered. Consult called to ID.   Verdis Prime RN BSN

## 2015-11-03 NOTE — Progress Notes (Signed)
Recreational Therapy Session Note  Patient Details  Name: Mitchell [name] MRN: [id number] Date of Birth: 01-29-1935 Today's Date: 11/03/2015  Pain: no c/o Skilled Therapeutic Interventions/Progress Updates: Pt participated in animal assisted activity/therapy seated in w/c with supervision. 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  Wife present & participatory in visit.  [place] 11/03/2015, 3:15 PM

## 2015-11-03 NOTE — Plan of Care (Signed)
Problem: RH PAIN MANAGEMENT Goal: RH STG PAIN MANAGED AT OR BELOW PT'S PAIN GOAL Outcome: Progressing <3        

## 2015-11-03 NOTE — Plan of Care (Signed)
Problem: SCI BLADDER ELIMINATION Goal: RH STG MANAGE BLADDER WITH ASSISTANCE STG Manage Bladder With mod I Assistance  Outcome: Not Progressing Incontinent of bladder at HS, total assist.  Problem: RH PAIN MANAGEMENT Goal: RH STG PAIN MANAGED AT OR BELOW PT'S PAIN GOAL Pain level < 3

## 2015-11-03 NOTE — Progress Notes (Signed)
[place] PHYSICAL MEDICINE & REHABILITATION     PROGRESS NOTE  Subjective/Complaints:  Pt seen laying in bed this AM.  Mitchell [name] wife notes that he had a pretty good weekend, however, he took a sleeping pill last night and now he is difficult to keep alert.    ROS: Denies CP, SOB, N/V/D.  Objective: Vital Signs: Blood pressure 127/58, pulse 76, temperature 97.7 F (36.5 C), temperature source Axillary, resp. rate 16, height 6\' 3"  (1.905 m), weight 129.774 kg (286 lb 1.6 oz), SpO2 92 %. No results found.  Recent Labs  11/02/15 0954  WBC 14.4*  HGB 9.0*  HCT 28.3*  PLT 427*   No results for input(s): NA, K, CL, GLUCOSE, BUN, CREATININE, CALCIUM in the last 72 hours.  Invalid input(s): CO CBG (last 3)  No results for input(s): GLUCAP in the last 72 hours.  Wt Readings from Last 3 Encounters:  10/29/15 129.774 kg (286 lb 1.6 oz)  10/24/15 122.471 kg (270 lb)  10/16/15 122.471 kg (270 lb)    Physical Exam:  BP 127/58 mmHg  Pulse 76  Temp(Src) 97.7 F (36.5 C) (Axillary)  Resp 16  Ht 6\' 3"  (1.905 m)  Wt 129.774 kg (286 lb 1.6 oz)  BMI 35.76 kg/m2  SpO2 92% Constitutional: He appears well-developed and well-nourished.  HENT: Normocephalic and atraumatic.  Eyes: Conjunctivae are normal. Cardiovascular: Normal rate and regular rhythm.  Respiratory: Effort normal and breath sounds normal. No stridor. No respiratory distress. He has no wheezes.  GI: Soft. Bowel sounds are normal. He exhibits no distension. There is no tenderness.  Musculoskeletal: He exhibits edema (1+ edema right ankle). He exhibits no tenderness.  Neurological: He is alert and oriented x0. HOH Motor: B/l UE: 4+/5 grossly proximal to distal RLE: hip flexion, knee extension 4+/5, ankle dorsi/plantarflexion 3/5 LLE: hip flexion, knee extension 4+/5, ankle dorsi/plantar flexion 4+/5  Skin: Skin is warm and dry. No erythema. Back incision with significant serosanguinous drainage from proximal small opening.   Psychiatric: He has a normal mood and affect. Thought content normal. Confused   Assessment/Plan: 1. Functional deficits secondary to lumbar radiculopathy L2/3 and kyphosis with L4/5 spondylolisthesis with hx of right foot drop which require 3+ hours per day of interdisciplinary therapy in a comprehensive inpatient rehab setting. Physiatrist is providing close team supervision and 24 hour management of active medical problems listed below. Physiatrist and rehab team continue to assess barriers to discharge/monitor patient progress toward functional and medical goals.  Function:  Bathing Bathing position   Position: Sitting EOB  Bathing parts Body parts bathed by patient: Right arm, Left arm, Chest, Abdomen Body parts bathed by helper: Right upper leg, Left upper leg, Right lower leg, Left lower leg, Back, Front perineal area, Buttocks  Bathing assist        Upper Body Dressing/Undressing Upper body dressing   What is the patient wearing?: Button up shirt         Button up shirt - Perfomed by patient: Thread/unthread right sleeve, Thread/unthread left sleeve, Button/unbutton shirt Button up shirt - Perfomed by helper: Pull shirt around back    Upper body assist        Lower Body Dressing/Undressing Lower body dressing   What is the patient wearing?: Pants       Pants- Performed by helper: Thread/unthread right pants leg, Thread/unthread left pants leg, Pull pants up/down  Lower body assist        Toileting Toileting Toileting activity did not occur: Safety/medical concerns   Toileting steps completed by helper: Adjust clothing prior to toileting, Performs perineal hygiene, Adjust clothing after toileting    Toileting assist Assist level: Two helpers   Transfers Chair/bed transfer   Chair/bed transfer method: Stand pivot Chair/bed transfer assist level: Moderate assist (Pt 50 - 74%/lift or lower) Chair/bed transfer assistive device:  Armrests, Medical sales representative     Max distance: 28ft Assist level: Moderate assist (Pt 50 - 74%)   Wheelchair   Type: Manual Max wheelchair distance: 163ft Assist Level: Supervision or verbal cues  Cognition Comprehension Comprehension assist level: Understands basic 90% of the time/cues < 10% of the time  Expression Expression assist level: Expresses basic needs/ideas: With extra time/assistive device  Social Interaction Social Interaction assist level: Interacts appropriately 90% of the time - Needs monitoring or encouragement for participation or interaction.  Problem Solving Problem solving assist level: Solves basic 50 - 74% of the time/requires cueing 25 - 49% of the time  Memory Memory assist level: Recognizes or recalls 50 - 74% of the time/requires cueing 25 - 49% of the time     Medical Problem List and Plan: 1. Gait and cognitive deficits with weakness secondary to lumbar radiculopathy L2/3 and kyphosis with L4/5 spondylolisthesis with hx of right foot drop  Cont CIR 2. DVT Prophylaxis/Anticoagulation: Pharmaceutical: Lovenox added as 5 days post op.  3. Pain Management:  Scheduled ultram qid for now.  Will continue oxycodone prn for now.  4. Mood: LCSW to follow for evaluation and support.  5. Neuropsych: This patient is capable of making decisions on Mitchell [name] own behalf. 6. Skin/Wound Care: Monitor wound for healing. Maintain adequate nutrition and hydration status.   Surgical wound drainage. Will consult surgery. 7. Fluids/Electrolytes/Nutrition: Monitor I/O.   Hyponatremia: 132 on 7/13 8. HTN: Monitor BP bid. On amlodipine and atenolol.  9. Ureteral stricture: Currently voiding without difficulty Monitor I/O.  10. Ankle fracture: To wear CAM boot when standing/walking. 11. Depression/Anxiety: On cymbalta 12. GERD: On protonix.  13. Asthma: Encourage IS. On dulera for hyperactive airway.  14. Constipation: Increased  miralax to bid. 15. CAD: Cont meds 16. Obesity: Body mass index is 33.75 kg/(m^2) on admission, diet and exercise education, encourage weight loss to increase endurance and promote overall health 17. Right ankle fracture: Cont boot 18. Chronic back pain: With severe central canal stenosis and foraminal narrowing: Cont meds 19. Right RTC tear and DJD right shoulder: Cont meds 20. Cognitive deficits with difficulty processing: Likely secondary to narcotics, appears to be improving 21. Leukocytosis  Likely from back, cultures pending.  WBCs 14.4  on 7/16  UA neg, Ucx multiple species  Will cont to monitor 22. ABLA  Hb 9.0 on 7/16  Cont to monitor  LOS (Days) 5 A FACE TO FACE EVALUATION WAS PERFORMED  Mitchell [name] [name] 11/03/2015 9:03 AM

## 2015-11-03 NOTE — Progress Notes (Signed)
Small amount of drainage to back incision at 2100. Dressing saturated at [place], cleaned incision and new dressing applied. At 2201 PRN trazodone 25mg 's and scheduled ultram given. A & O to self only, unable to reorient. Several attempts to get OOB. " I need to go get my truck." Bedalarm in use. Wife at bedside. Bilateral heels red, right > left. Elevated off bed with pillows. Incontinent of urine. Mitchell [name] A

## 2015-11-03 NOTE — Progress Notes (Signed)
Recreational Therapy Session Note  Patient Details  Name: ASH MCELWAIN MRN: [id number] Date of Birth: 20-Apr-1934 Today's Date: 11/03/2015  Order received and chart reviewed.  Met pt briefly last week while providing +2 assist to OT.  Due to low activity tolerance, pt placed on HOLD for TR services.  Will continue to monitor through team for future participation.  Annalyn Blecher 11/03/2015, 10:42 AM

## 2015-11-03 NOTE — Progress Notes (Signed)
Occupational Therapy Session Note  Patient Details  Name: KATELYN KOHLMEYER MRN: [id number] Date of Birth: Sep 30, 1934  Today's Date: 11/03/2015 OT Individual Time: 0930-1030 OT Individual Time Calculation (min): 60 min    Short Term Goals: Week 1:  OT Short Term Goal 1 (Week 1): Pt will sit to stand with min-mod A with RW to prepare for LB dressing. OT Short Term Goal 2 (Week 1): Pt will don pants over feet with reacher with min A. OT Short Term Goal 3 (Week 1): Pt will bathe LB with long sponge with mod A. OT Short Term Goal 4 (Week 1): Pt will transfer to [facility] using scoot transfer with min A.  Skilled Therapeutic Interventions/Progress Updates:    Pt seen for ADL retraining with a focus on sit to stand skills, standing balance, and activity tolerance. Pt's wife present and actively participating.  Pt received in w/c. Worked on sit to stand to sink during ADLs and then to RW afterward focusing on safe hand placement and technique. Introduced use of reacher and pt was able to begin to use it but did need assist to fully donn pants over feet.  Worked on standing tolerance of up to 2 minutes at a time, several times during the session.  Pt tolerated exercises well with minimal pain. Pt in w/c with quick release belt on and all needs met.  Therapy Documentation Precautions:  Precautions Precautions: Fall, Back Precaution Comments: unable to recall 0/3 back precautions.  Required Braces or Orthoses: Spinal Brace Spinal Brace: Lumbar corset, Applied in sitting position (Pt only able to provide min assist for donning brace.) Restrictions Weight Bearing Restrictions: No Other Position/Activity Restrictions: CAM boot for RLE due to ankle fx, shoe for left foot    Vital Signs: Therapy Vitals Temp Source: Axillary BP: 100/60 mmHg Patient Position (if appropriate): Sitting Oxygen Therapy SpO2: 94 % O2 Device: Not Delivered Pain: Pain Assessment Pain Assessment: No/denies  pain ADL: ADL ADL Comments: Refer to functional navigator  See Function Navigator for Current Functional Status.   Therapy/Group: Individual Therapy  [place] 11/03/2015, 12:28 PM

## 2015-11-04 ENCOUNTER — Inpatient Hospital Stay (HOSPITAL_COMMUNITY): Payer: Medicare Other | Admitting: Physical Therapy

## 2015-11-04 ENCOUNTER — Inpatient Hospital Stay (HOSPITAL_COMMUNITY): Payer: Medicare Other | Admitting: Occupational Therapy

## 2015-11-04 DIAGNOSIS — T8149XA Infection following a procedure, other surgical site, initial encounter: Secondary | ICD-10-CM | POA: Diagnosis not present

## 2015-11-04 DIAGNOSIS — T814XXD Infection following a procedure, subsequent encounter: Secondary | ICD-10-CM

## 2015-11-04 DIAGNOSIS — T8131XD Disruption of external operation (surgical) wound, not elsewhere classified, subsequent encounter: Secondary | ICD-10-CM

## 2015-11-04 LAB — BASIC METABOLIC PANEL
Anion gap: 5 (ref 5–15)
BUN: 9 mg/dL (ref 6–20)
CO2: 31 mmol/L (ref 22–32)
Calcium: 8.3 mg/dL — ABNORMAL LOW (ref 8.9–10.3)
Chloride: 99 mmol/L — ABNORMAL LOW (ref 101–111)
Creatinine, Ser: 0.68 mg/dL (ref 0.61–1.24)
GFR calc Af Amer: >60 mL/min (ref >60)
GFR calc non Af Amer: >60 mL/min (ref >60)
Glucose, Bld: 105 mg/dL — ABNORMAL HIGH (ref 65–99)
Potassium: 4 mmol/L (ref 3.5–5.1)
Sodium: 135 mmol/L (ref 135–145)

## 2015-11-04 LAB — CBC WITH DIFFERENTIAL/PLATELET
Basophils Absolute: 0 10*3/uL (ref 0.0–0.1)
Basophils Relative: 0 %
Eosinophils Absolute: 0.2 10*3/uL (ref 0.0–0.7)
Eosinophils Relative: 2 %
HCT: 29.4 % — ABNORMAL LOW (ref 39.0–52.0)
Hemoglobin: 9.2 g/dL — ABNORMAL LOW (ref 13.0–17.0)
Lymphocytes Relative: 20 %
Lymphs Abs: 2.6 10*3/uL (ref 0.7–4.0)
MCH: 30.5 pg (ref 26.0–34.0)
MCHC: 31.3 g/dL (ref 30.0–36.0)
MCV: 97.4 fL (ref 78.0–100.0)
Monocytes Absolute: 1.4 10*3/uL — ABNORMAL HIGH (ref 0.1–1.0)
Monocytes Relative: 11 %
Neutro Abs: 8.8 10*3/uL — ABNORMAL HIGH (ref 1.7–7.7)
Neutrophils Relative %: 67 %
Platelets: 480 10*3/uL — ABNORMAL HIGH (ref 150–400)
RBC: 3.02 MIL/uL — ABNORMAL LOW (ref 4.22–5.81)
RDW: 14.1 % (ref 11.5–15.5)
WBC: 13.1 10*3/uL — ABNORMAL HIGH (ref 4.0–10.5)

## 2015-11-04 LAB — GLUCOSE, CAPILLARY: Glucose-Capillary: 159 mg/dL — ABNORMAL HIGH (ref 65–99)

## 2015-11-04 MED ORDER — LORAZEPAM 2 MG/ML IJ SOLN
INTRAMUSCULAR | Status: AC
  Filled 2015-11-04: qty 1

## 2015-11-04 MED ORDER — LORAZEPAM 2 MG/ML IJ SOLN
0.5000 mg | Freq: Once | INTRAMUSCULAR | Status: DC
  Filled 2015-11-04 (×2): qty 1

## 2015-11-04 MED ORDER — TRAZODONE HCL 50 MG PO TABS
25.0000 mg | ORAL_TABLET | Freq: Every evening | ORAL | Status: DC | PRN
  Administered 2015-11-04 – 2015-11-06 (×3): 25 mg via ORAL
  Filled 2015-11-04 (×3): qty 1

## 2015-11-04 NOTE — Plan of Care (Signed)
Problem: RH Bed Mobility Goal: LTG Patient will perform bed mobility with assist (PT) LTG: Patient will perform bed mobility with assistance, with/without cues (PT).  Downgrade 2/2 decreased strength & safety awareness

## 2015-11-04 NOTE — Progress Notes (Signed)
Physical Therapy Session Note  Patient Details  Name: Mitchell [name] MRN: [id number] Date of Birth: 02/25/1935  Today's Date: 11/04/2015 PT Individual Time: [id number] and [id number] PT Individual Time Calculation (min): 87 min and 27 min    Short Term Goals: Week 1:  PT Short Term Goal 1 (Week 1): Patient will consistent perform stand pivot transfer and sit<>stand with Mod A and RW.  PT Short Term Goal 2 (Week 1): Patient will performed bed mobility with min A.  PT Short Term Goal 3 (Week 1): Patient will  ambulate 67ft with Mod A from PT with RW  PT Short Term Goal 4 (Week 1): Patient will perform car transfer with max A and RW.   Skilled Therapeutic Interventions/Progress Updates:    Treatment 1: Pt received sitting on EOB & agreeable to PT. Pt noted low back pain but unable to rate on pain scale; RN notified & administered pain medication. Pt able to complete sit>stand transfer with mod A +2 & maximum cuing for safety; pt completed stand pivot bed>w/c with +2 and maximum cuing for sequencing as pt with difficulty following one step commands. In hallway, pt educated on anterior weight shift & pushing up with BUE on w/c armrests & able to transfer sit>stand with Mod A +2 for safety. Gait training x 5 ft + 10 ft + 10 ft + 20 ft with RW & mod A + w/c follow for safety. Pt demonstrates step to gait pattern, decreased step length BLE (R less than L), decreased weight shift R and cuing to look up instead of at floor. Pt would impulsively transfer stand>sit without w/c being locked & clearance from PT. When PT educated pt on importance of notifying therapist of his need to sit for safety pt became frustrated & argumentative; continued to educate pt throughout session. Pt reports SOB during ambulation but unable to follow multimodal cuing for pursed lip breathing; pt required extended seated rest breaks throughout activity. Pt propelled w/c x 30 ft + 68ft with BUE & supervision (in open hallway) for  cardiovascular endurance training.  In gym pt performed stand pivot transfer w/c<>nu-step with RW in same manner as noted above. Utilized nu-step x 10 minutes up to level 3 for BLE strengthening & endurance training; pt required frequent rest breaks 2/2 fatigue. Utilized cybex kinetron sitting in w/c for BLE strengthening; pt with significantly weak hip flexors. At end of session pt returned to room & left in w/c with all needs within reach & QRB donned.  Throughout session pt became very frustrated with various tasks & cussing frequently in conversation. Pt continues to remain confused, but able to report "hospital" in conversation but unable to state name of facility; PT continued to re-orient pt throughout session.  Treatment 2: Pt received in bed reporting back pain but did not rate; pt's sister-in-law ([name]) present during session & reported he had just received pain medication. Pt instructed in log rolling and sidelying>sitting EOB with use of bed rails & Min A overall. Pt with significant RUE weakness and decreased ability to push to sitting. PT donned back brace, L shoe & R Cam boot total A. Instructed pt in hand placement for sit>stand transfers and pt unable to complete transfer from elevated surface even with Max A+1. Pt became very frustrated and began to adamantly refuse to participate. Encouraged pt to transfer to w/c or perform BLE exercises but pt not agreeable. Pt reported he was at his house; provided cuing and reoriented pt to location &  situation. Pt then agreeable to transfer to recliner and completed sit<>stand with max A x + for safety & due to weakness. Pt transferred bed>recliner with RW but with poor eccentric control and impulsively sat in chair. At end of session pt left sitting in recliner with BLE elevated, all needs within reach & sister-in-law present.  Therapy Documentation Precautions:  Precautions Precautions: Fall, Back Precaution Comments: unable to recall 0/3 back  precautions.  Required Braces or Orthoses: Spinal Brace Spinal Brace: Lumbar corset, Applied in sitting position (Pt only able to provide min assist for donning brace.) Restrictions Weight Bearing Restrictions: No Other Position/Activity Restrictions: CAM boot for RLE due to ankle fx, shoe for left foot  General: PT Amount of Missed Time (min): 33 Minutes PT Missed Treatment Reason: Patient unwilling to participate  See Function Navigator for Current Functional Status.   Therapy/Group: Individual Therapy  [name] 11/04/2015, 7:59 AM

## 2015-11-04 NOTE — Progress Notes (Signed)
Patient ID: Mitchell [name], male   DOB: 29-Jun-1934, 81 y.o.   MRN: [id number]  Alert, smiling, working with therapist. Luanna Salk changed few minutes ago - without change from last night: copious thin drainage.  Incision without change per report.  WBC down to 13.1 today after IVAB initiated yesterday. Plan to continue drsg changes, continue Rehab, and appreciate ID's input.   Verdis Prime RN BSN

## 2015-11-04 NOTE — Progress Notes (Signed)
We took down dressing.  There does not appear to be erythema.  There is serosanguinous drainage from the upper aspect of the wound.  Continue IV antibiotics.  Await ID recommendations.  My suspicion of deep space infection is low.

## 2015-11-04 NOTE — Progress Notes (Signed)
[place] PHYSICAL MEDICINE & REHABILITATION     PROGRESS NOTE  Subjective/Complaints:  Pt laying in bed this AM.  He slept well overnight per wife after being given a sleeping pill.    ROS: Denies CP, SOB, N/V/D.  Objective: Vital Signs: Blood pressure 115/53, pulse 73, temperature 98.4 F (36.9 C), temperature source Axillary, resp. rate 18, height 6\' 3"  (1.905 m), weight 129.774 kg (286 lb 1.6 oz), SpO2 98 %. No results found.  Recent Labs  11/03/15 1114 11/04/15 0548  WBC 15.2* 13.1*  HGB 10.4* 9.2*  HCT 32.9* 29.4*  PLT 523* 480*    Recent Labs  11/03/15 1114 11/04/15 0548  NA 133* 135  K 4.3 4.0  CL 97* 99*  GLUCOSE 181* 105*  BUN 10 9  CREATININE 0.65 0.68  CALCIUM 8.5* 8.3*   CBG (last 3)  No results for input(s): GLUCAP in the last 72 hours.  Wt Readings from Last 3 Encounters:  10/29/15 129.774 kg (286 lb 1.6 oz)  10/24/15 122.471 kg (270 lb)  10/16/15 122.471 kg (270 lb)    Physical Exam:  BP 115/53 mmHg  Pulse 73  Temp(Src) 98.4 F (36.9 C) (Axillary)  Resp 18  Ht 6\' 3"  (1.905 m)  Wt 129.774 kg (286 lb 1.6 oz)  BMI 35.76 kg/m2  SpO2 98% Constitutional: He appears well-developed and well-nourished.  HENT: Normocephalic and atraumatic.  Eyes: Conjunctivae are normal. Cardiovascular: Normal rate and regular rhythm.  Respiratory: Effort normal and breath sounds normal. No stridor. No respiratory distress. He has no wheezes.  GI: Soft. Bowel sounds are normal. He exhibits no distension. There is no tenderness.  Musculoskeletal: He exhibits edema (1+ edema right ankle). He exhibits no tenderness.  Neurological: He is alert and oriented x1. HOH Motor: B/l UE: 4+/5 grossly proximal to distal RLE: hip flexion, knee extension 4+/5, ankle dorsi/plantarflexion 3/5 LLE: hip flexion, knee extension 4+/5, ankle dorsi/plantar flexion 4+/5  Skin: Skin is warm and dry. No erythema. Back incision with serosanguinous drainage from proximal small opening.   Psychiatric: He has a normal mood and affect. Thought content normal. Confused   Assessment/Plan: 1. Functional deficits secondary to lumbar radiculopathy L2/3 and kyphosis with L4/5 spondylolisthesis with hx of right foot drop which require 3+ hours per day of interdisciplinary therapy in a comprehensive inpatient rehab setting. Physiatrist is providing close team supervision and 24 hour management of active medical problems listed below. Physiatrist and rehab team continue to assess barriers to discharge/monitor patient progress toward functional and medical goals.  Function:  Bathing Bathing position   Position: Wheelchair/chair at sink  Bathing parts Body parts bathed by patient: Right arm, Left arm, Chest, Abdomen, Front perineal area, Right upper leg, Left upper leg Body parts bathed by helper: Buttocks, Right lower leg, Left lower leg, Back  Bathing assist Assist Level:  (patient completed 40%, max assist)      Upper Body Dressing/Undressing Upper body dressing   What is the patient wearing?: Pull over shirt/dress     Pull over shirt/dress - Perfomed by patient: Thread/unthread right sleeve, Thread/unthread left sleeve, Put head through opening Pull over shirt/dress - Perfomed by helper: Pull shirt over trunk Button up shirt - Perfomed by patient: Thread/unthread right sleeve, Thread/unthread left sleeve, Button/unbutton shirt Button up shirt - Perfomed by helper: Pull shirt around back    Upper body assist Assist Level:  (75%, min assist)      Lower Body Dressing/Undressing Lower body dressing   What is the patient  wearing?: Pants, Underwear, [employer], Shoes   Underwear - Performed by helper: Thread/unthread right underwear leg, Thread/unthread left underwear leg, Pull underwear up/down   Pants- Performed by helper: Thread/unthread right pants leg, Thread/unthread left pants leg, Pull pants up/down                   TED Hose - Performed by helper: Don/doff  right TED hose, Don/doff left TED hose  Lower body assist        Toileting Toileting Toileting activity did not occur: Safety/medical concerns   Toileting steps completed by helper: Adjust clothing prior to toileting, Performs perineal hygiene, Adjust clothing after toileting    Toileting assist Assist level: Two helpers   Transfers Chair/bed transfer   Chair/bed transfer method: Stand pivot Chair/bed transfer assist level: Moderate assist (Pt 50 - 74%/lift or lower) Chair/bed transfer assistive device: Armrests, Medical sales representative     Max distance: 12 ft Assist level: Touching or steadying assistance (Pt > 75%)   Wheelchair   Type: Manual Max wheelchair distance: 30 ft (linear path in hallway) Assist Level: Supervision or verbal cues  Cognition Comprehension Comprehension assist level: Follows basic conversation/direction with no assist  Expression Expression assist level: Expresses basic 75 - 89% of the time/requires cueing 10 - 24% of the time. Needs helper to occlude trach/needs to repeat words.  Social Interaction Social Interaction assist level: Interacts appropriately 90% of the time - Needs monitoring or encouragement for participation or interaction.  Problem Solving Problem solving assist level: Solves basic 50 - 74% of the time/requires cueing 25 - 49% of the time  Memory Memory assist level: Recognizes or recalls 50 - 74% of the time/requires cueing 25 - 49% of the time     Medical Problem List and Plan: 1. Gait and cognitive deficits with weakness secondary to lumbar radiculopathy L2/3 and kyphosis with L4/5 spondylolisthesis with hx of right foot drop  Cont CIR 2. DVT Prophylaxis/Anticoagulation: Pharmaceutical: Lovenox added as 5 days post op.  3. Pain Management:  Scheduled ultram qid for now.  Will continue oxycodone prn for now.  4. Mood: LCSW to follow for evaluation and support.  5. Neuropsych: This  patient is capable of making decisions on his own behalf. 6. Skin/Wound Care: Monitor wound for healing. Maintain adequate nutrition and hydration status.   Surgical wound infection. Appreciate Neurosurg and ID recs.    Cont IV Abx.   7. Fluids/Electrolytes/Nutrition: Monitor I/O.   Hyponatremia: 135 on 7/18 8. HTN: Monitor BP bid. On amlodipine and atenolol.  9. Ureteral stricture: Currently voiding without difficulty Monitor I/O.  10. Ankle fracture: To wear CAM boot when standing/walking. 11. Depression/Anxiety: On cymbalta 12. GERD: On protonix.  13. Asthma: Encourage IS. On dulera for hyperactive airway.  14. Constipation: Increased miralax to bid. 15. CAD: Cont meds 16. Obesity: Body mass index is 33.75 kg/(m^2) on admission, diet and exercise education, encourage weight loss to increase endurance and promote overall health 17. Right ankle fracture: Cont boot 18. Chronic back pain: With severe central canal stenosis and foraminal narrowing: Cont meds 19. Right RTC tear and DJD right shoulder: Cont meds 20. Cognitive deficits with difficulty processing: Likely secondary to narcotics, appears to be improving 21. Leukocytosis  Wound cx with Pseudomonas, sensitives  pending.  WBCs 13.1  on 7/18 (improved with abx)  UA neg, Ucx multiple species  Will cont to monitor 22. ABLA  Hb 9.2 on 7/18  Cont to monitor  LOS (  Days) 6 A FACE TO FACE EVALUATION WAS PERFORMED  Mitchell [name] Mitchell [name] 11/04/2015 8:46 AM

## 2015-11-04 NOTE — Progress Notes (Signed)
Occupational Therapy Session Note  Patient Details  Name: Mitchell [name] MRN: [id number] Date of Birth: 1935/02/20  Today's Date: 11/04/2015 OT Individual Time: 0930-1030 OT Individual Time Calculation (min): 60 min    Short Term Goals: Week 1:  OT Short Term Goal 1 (Week 1): Pt will sit to stand with min-mod A with RW to prepare for LB dressing. OT Short Term Goal 2 (Week 1): Pt will don pants over feet with reacher with min A. OT Short Term Goal 3 (Week 1): Pt will bathe LB with long sponge with mod A. OT Short Term Goal 4 (Week 1): Pt will transfer to [name] using scoot transfer with min A.  Skilled Therapeutic Interventions/Progress Updates:    Pt seen this session to facilitate functional mobility, cognition, use of AE with ADL retraining. Pt sat EOB with mod A to bathe with mod A. He did state that he was in a hospital, but thought this OT was one of his old friends. He could not recall that he had back surgery.  Overall, he demonstrated improvement in his processing and following directions with using AE for donning underwear/pants over feet. "I am getting my mind back", pt stated.   Pt with draining back wound. RN called in. Pt needed to toilet. Brace donned to allow pt to scoot transfer to [facility]. Pt then stood with RW for therapist to cleanse him and then transferred back to EOB using stand pivot with RW. Much improved processing with taking steps with feet to turn safely.  Pt resting EOB, PT arrived for his next session.  Therapy Documentation Precautions:  Precautions Precautions: Fall, Back Precaution Comments: unable to recall 0/3 back precautions.  Required Braces or Orthoses: Spinal Brace Spinal Brace: Lumbar corset, Applied in sitting position (Pt only able to provide min assist for donning brace.) Restrictions Weight Bearing Restrictions: No Other Position/Activity Restrictions: CAM boot for RLE due to ankle fx, shoe for left foot    Vital Signs: Therapy Vitals Pulse  Rate: 72 BP: 136/72 mmHg Pain: Pain Assessment Pain Assessment: 0-10 Pain Score: 0-No pain Pain Type: Acute pain Pain Location: Back Pain Orientation: Lower Pain Descriptors / Indicators: Aching Pain Onset: With Activity Pain Intervention(s): RN made aware ADL: ADL ADL Comments: Refer to functional navigator  See Function Navigator for Current Functional Status.   Therapy/Group: Individual Therapy  SAGUIER,JULIA 11/04/2015, 12:48 PM

## 2015-11-05 ENCOUNTER — Inpatient Hospital Stay (HOSPITAL_COMMUNITY): Payer: Medicare Other | Admitting: Physical Therapy

## 2015-11-05 ENCOUNTER — Inpatient Hospital Stay (HOSPITAL_COMMUNITY): Payer: Medicare Other | Admitting: Occupational Therapy

## 2015-11-05 DIAGNOSIS — M549 Dorsalgia, unspecified: Secondary | ICD-10-CM

## 2015-11-05 DIAGNOSIS — G8929 Other chronic pain: Secondary | ICD-10-CM

## 2015-11-05 LAB — AEROBIC CULTURE W GRAM STAIN (SUPERFICIAL SPECIMEN): Special Requests: NORMAL

## 2015-11-05 LAB — CREATININE, SERUM
Creatinine, Ser: 0.57 mg/dL — ABNORMAL LOW (ref 0.61–1.24)
GFR calc Af Amer: >60 mL/min (ref >60)
GFR calc non Af Amer: >60 mL/min (ref >60)

## 2015-11-05 MED ORDER — TRAMADOL HCL 50 MG PO TABS
25.0000 mg | ORAL_TABLET | Freq: Three times a day (TID) | ORAL | Status: DC
  Administered 2015-11-05 – 2015-11-06 (×7): 25 mg via ORAL
  Filled 2015-11-05 (×6): qty 1

## 2015-11-05 MED ORDER — ACETAMINOPHEN 500 MG PO TABS
500.0000 mg | ORAL_TABLET | Freq: Four times a day (QID) | ORAL | Status: DC | PRN
  Administered 2015-11-06 – 2015-11-10 (×6): 500 mg via ORAL
  Filled 2015-11-05 (×6): qty 1

## 2015-11-05 NOTE — Progress Notes (Signed)
[place] PHYSICAL MEDICINE & REHABILITATION     PROGRESS NOTE  Subjective/Complaints:  Pt laying in bed.  Wife at bedside.  She states he is slowly improving.  He remains confused.   ROS: Denies CP, SOB, N/V/D.  Objective: Vital Signs: Blood pressure 116/63, pulse 65, temperature 97.6 F (36.4 C), temperature source Oral, resp. rate 17, height 6\' 3"  (1.905 m), weight 129.774 kg (286 lb 1.6 oz), SpO2 96 %. No results found.  Recent Labs  11/03/15 1114 11/04/15 0548  WBC 15.2* 13.1*  HGB 10.4* 9.2*  HCT 32.9* 29.4*  PLT 523* 480*    Recent Labs  11/03/15 1114 11/04/15 0548 11/05/15 0624  NA 133* 135  --   K 4.3 4.0  --   CL 97* 99*  --   GLUCOSE 181* 105*  --   BUN 10 9  --   CREATININE 0.65 0.68 0.57*  CALCIUM 8.5* 8.3*  --    CBG (last 3)   Recent Labs  11/04/15 1104  GLUCAP 159*    Wt Readings from Last 3 Encounters:  10/29/15 129.774 kg (286 lb 1.6 oz)  10/24/15 122.471 kg (270 lb)  10/16/15 122.471 kg (270 lb)    Physical Exam:  BP 116/63 mmHg  Pulse 65  Temp(Src) 97.6 F (36.4 C) (Oral)  Resp 17  Ht 6\' 3"  (1.905 m)  Wt 129.774 kg (286 lb 1.6 oz)  BMI 35.76 kg/m2  SpO2 96% Constitutional: He appears well-developed and well-nourished.  HENT: Normocephalic and atraumatic.  Eyes: Conjunctivae are normal. Cardiovascular: Normal rate and regular rhythm.  Respiratory: Effort normal and breath sounds normal. No stridor. No respiratory distress. He has no wheezes.  GI: Soft. Bowel sounds are normal. He exhibits no distension. There is no tenderness.  Musculoskeletal: He exhibits edema (1+ edema right ankle). He exhibits no tenderness.  Neurological: He is alert and oriented x1. HOH Motor: B/l UE: 4+/5 grossly proximal to distal RLE: hip flexion, knee extension 4+/5, ankle dorsi/plantarflexion 4-/5 LLE: hip flexion, knee extension 4+/5, ankle dorsi/plantar flexion 4+/5  Skin: Skin is warm and dry. No erythema. Back incision with copious  serosanguinous drainage from proximal small opening (unchanged).  Psychiatric: He has a normal mood and affect. Thought content normal. Confused   Assessment/Plan: 1. Functional deficits secondary to lumbar radiculopathy L2/3 and kyphosis with L4/5 spondylolisthesis with hx of right foot drop which require 3+ hours per day of interdisciplinary therapy in a comprehensive inpatient rehab setting. Physiatrist is providing close team supervision and 24 hour management of active medical problems listed below. Physiatrist and rehab team continue to assess barriers to discharge/monitor patient progress toward functional and medical goals.  Function:  Bathing Bathing position   Position: Sitting EOB  Bathing parts Body parts bathed by patient: Right arm, Left arm, Chest, Abdomen, Front perineal area, Right upper leg, Left upper leg Body parts bathed by helper: Buttocks, Right lower leg, Left lower leg, Back  Bathing assist Assist Level:  (patient completed 40%, max assist)      Upper Body Dressing/Undressing Upper body dressing   What is the patient wearing?: Pull over shirt/dress     Pull over shirt/dress - Perfomed by patient: Thread/unthread right sleeve, Thread/unthread left sleeve Pull over shirt/dress - Perfomed by helper: Put head through opening, Pull shirt over trunk Button up shirt - Perfomed by patient: Thread/unthread right sleeve, Thread/unthread left sleeve, Button/unbutton shirt Button up shirt - Perfomed by helper: Pull shirt around back    Upper body assist Assist  Level:  (75%, min assist)      Lower Body Dressing/Undressing Lower body dressing   What is the patient wearing?: Pants, Underwear, [employer], Shoes Underwear - Performed by patient: Thread/unthread right underwear leg, Thread/unthread left underwear leg Underwear - Performed by helper: Pull underwear up/down Pants- Performed by patient: Thread/unthread right pants leg, Thread/unthread left pants leg Pants-  Performed by helper: Pull pants up/down           Shoes - Performed by helper: Don/doff right shoe, Don/doff left shoe, Fasten right, Fasten left       TED Hose - Performed by helper: Don/doff right TED hose, Don/doff left TED hose  Lower body assist Assist for lower body dressing: Touching or steadying assistance (Pt > 75%)      Toileting Toileting Toileting activity did not occur: Safety/medical concerns   Toileting steps completed by helper: Adjust clothing prior to toileting, Performs perineal hygiene, Adjust clothing after toileting    Toileting assist Assist level: Two helpers   Transfers Chair/bed transfer   Chair/bed transfer method: Stand pivot Chair/bed transfer assist level: 2 helpers Chair/bed transfer assistive device: Medical sales representative     Max distance: 20 ft Assist level: 2 helpers (mod A + w/c follow)   Wheelchair   Type: Manual Max wheelchair distance: 30 ft Assist Level: Supervision or verbal cues  Cognition Comprehension Comprehension assist level: Understands basic 90% of the time/cues < 10% of the time  Expression Expression assist level: Expresses basic 90% of the time/requires cueing < 10% of the time.  Social Interaction Social Interaction assist level: Interacts appropriately 90% of the time - Needs monitoring or encouragement for participation or interaction.  Problem Solving Problem solving assist level: Solves basic 25 - 49% of the time - needs direction more than half the time to initiate, plan or complete simple activities  Memory Memory assist level: Recognizes or recalls 25 - 49% of the time/requires cueing 50 - 75% of the time     Medical Problem List and Plan: 1. Gait and cognitive deficits with weakness secondary to lumbar radiculopathy L2/3 and kyphosis with L4/5 spondylolisthesis with hx of right foot drop  Cont CIR 2. DVT Prophylaxis/Anticoagulation: Pharmaceutical: Lovenox added as 5 days post op.  3. Pain  Management:  Scheduled ultram qid for now, decreased dose on 7/19.  Will continue oxycodone prn for now.  4. Mood: LCSW to follow for evaluation and support.  5. Neuropsych: This patient is capable of making decisions on his own behalf. 6. Skin/Wound Care: Monitor wound for healing. Maintain adequate nutrition and hydration status.   Surgical wound infection. Appreciate Neurosurg and ID recs.    Cont IV Abx.   7. Fluids/Electrolytes/Nutrition: Monitor I/O.   Hyponatremia: 135 on 7/18 8. HTN: Monitor BP bid. On amlodipine and atenolol.  9. Ureteral stricture: Currently voiding without difficulty Monitor I/O.  10. Ankle fracture: To wear CAM boot when standing/walking. 11. Depression/Anxiety: On cymbalta 12. GERD: On protonix.  13. Asthma: Encourage IS. On dulera for hyperactive airway.  14. Constipation: Increased miralax to bid. 15. CAD: Cont meds 16. Obesity: Body mass index is 33.75 kg/(m^2) on admission, diet and exercise education, encourage weight loss to increase endurance and promote overall health 17. Right ankle fracture: Cont boot 18. Chronic back pain: With severe central canal stenosis and foraminal narrowing: Cont meds 19. Right RTC tear and DJD right shoulder: Cont meds 20. Cognitive deficits with difficulty processing: Likely secondary to narcotics, appears to  be improving 21. Leukocytosis  Wound cx with Pseudomonas, sensitives  pending.  WBCs 13.1  on 7/18 (improved with abx)  UA neg, Ucx multiple species  Will cont to monitor 22. ABLA  Hb 9.2 on 7/18  Cont to monitor 13. Confusion  Likely secondary to infection  Will cont to monitor, medications also reviewed, pain medications adjusted.  LOS (Days) 7 A FACE TO FACE EVALUATION WAS PERFORMED  Ankit [name] 11/05/2015 7:50 AM

## 2015-11-05 NOTE — Progress Notes (Signed)
Patient very agitated, verbally & physically aggressive towards staff. Confused & kept saying he had to go to see his truck. He was difficult to redirect. Notified Dan (PA) with order to give Ativan. Patient's wife arrived & patient was assisted back to his bed. Informed wife of ativan to be given to calm patient but wife refused with concerns of the side effects as patient has not taken it before. Requested to give trazodone instead. Patient calm & cooperative @ this time.

## 2015-11-05 NOTE — Progress Notes (Signed)
Physical Therapy Session Note  Patient Details  Name: Mitchell [name] MRN: [id number] Date of Birth: 06/07/1934  Today's Date: 11/05/2015 PT Individual Time: 0900-1000 PT Individual Time Calculation (min): 60 min   Short Term Goals: Week 1:  PT Short Term Goal 1 (Week 1): Patient will consistent perform stand pivot transfer and sit<>stand with Mod A and RW.  PT Short Term Goal 2 (Week 1): Patient will performed bed mobility with min A.  PT Short Term Goal 3 (Week 1): Patient will  ambulate 39ft with Mod A from PT with RW  PT Short Term Goal 4 (Week 1): Patient will perform car transfer with max A and RW.   Skilled Therapeutic Interventions/Progress Updates:    Pt received in bed & agreeable to PT; pt's wife present during session & pt reporting low back pain but unable to rate. Per wife pt received pain medication prior to PT arrival. Pt able to transfer supine>R sidelying>sitting with Mod A for BLE & trunk and maximum cuing for sequencing. Pt with great difficulty pushing sidelying>sitting 2/2 R rotator cuff injury (per wife). While sitting EOB therapist donned shirt, back brace, R cam boot & L shoe total A for time management. Pt performed seated long arc quads & hip flexion exercises; pt with significantly weak hip flexors. Session focused on transfer & gait training. Pt required maximum multimodal cuing for hand placement & mod A +1<>+2 for sit<>stand transfers with +2 assistance for stand pivot transfers and cuing for sequencing. Gait training x 18 ft + 25 ft + 30 ft + 25 ft with RW & +2 (min/mod A & w/c follow for safety). Pt demonstrates decreased weight shift to R, impaired balance during gait, and cuing to look up. Pt propelled w/c x 100 ft back to room with BUE; pt required supervision for linear path in controlled hallway but max A to turn in room. At end of session pt left sitting in w/c with all needs within reach & wife present. Pt continues to remain confused with PT & wife re-orienting  him on location & situation throughout session.  Pt is very impulsive & has decreased safety awareness with transfers.  Therapy Documentation Precautions:  Precautions Precautions: Fall, Back Precaution Comments: unable to recall 0/3 back precautions.  Required Braces or Orthoses: Spinal Brace Spinal Brace: Lumbar corset, Applied in sitting position (Pt only able to provide min assist for donning brace.) Restrictions Weight Bearing Restrictions: No Other Position/Activity Restrictions: CAM boot for RLE due to ankle fx, shoe for left foot   See Function Navigator for Current Functional Status.   Therapy/Group: Individual Therapy  [name] 11/05/2015, 7:55 AM

## 2015-11-05 NOTE — Progress Notes (Signed)
Physical Therapy Session Note  Patient Details  Name: Mitchell [name] MRN: [id number] Date of Birth: [birth year]  Today's Date: 11/05/2015 PT Individual Time: 1432-1530 PT Individual Time Calculation (min): 58 min   Short Term Goals: Week 1:  PT Short Term Goal 1 (Week 1): Patient will consistent perform stand pivot transfer and sit<>stand with Mod A and RW.  PT Short Term Goal 2 (Week 1): Patient will performed bed mobility with min A.  PT Short Term Goal 3 (Week 1): Patient will  ambulate 52ft with Mod A from PT with RW  PT Short Term Goal 4 (Week 1): Patient will perform car transfer with max A and RW.  Week 2:     Skilled Therapeutic Interventions/Progress Updates:    Patient received sitting in WC and agreeable to PT.  PT instructed patient inWC mobility in hall 199ft and 111ft with supervision A with 1 episode of min A to prevent running into wall in hall. Mod to orient patient WC parts as well as increased use of ipsilateral UE to hold wheel and decrease turning radius.  Stand pivot transfer to and from [name] x2 with Mod A from PT and mod cues for proper UE positioning and sequencing of movement.  Standing therex: March x 8 BLE Sitting therex: hip abduction x 12 BLE with manual resistance, AROM LAQ x 12 BLE.  PT provided mod cues for improved ROM, decreased compensation from trunk, and increased control with eccentric movement.   Nustep endurance training x 12 minutes, 3 minlevel 4, 7 min level 6, 2 min level 7. Mod cues for decreased SPM <45. Mod cues for full ROM with increased hip and knee extension.   Gait training x 46ft with mod A from PT with Bariatric RW. PT provided mod cus for increased step length and step width to improved safety with gait with only slight change noted following cues from PT.     Throughout PT treatment session, patient required constant cues from wife and rehab staff for orientation to sitiuation and place to prevent increased agitation.   Patient  returned to room and left sitting in [name] with call bell within reach and wife present.      Therapy Documentation Precautions:  Precautions Precautions: Fall, Back Precaution Comments: unable to recall 0/3 back precautions.  Required Braces or Orthoses: Spinal Brace Spinal Brace: Lumbar corset, Applied in sitting position (Pt only able to provide min assist for donning brace.) Restrictions Weight Bearing Restrictions: No Other Position/Activity Restrictions: CAM boot for RLE due to ankle fx, shoe for left foot General:   Vital Signs: Therapy Vitals Temp: 98.6 F (37 C) Temp Source: Oral Pulse Rate: 78 Resp: 16 BP: (!) 100/51 mmHg Patient Position (if appropriate): Lying Oxygen Therapy SpO2: 95 % O2 Device: Not Delivered Pain: 4/10    See Function Navigator for Current Functional Status.   Therapy/Group: Individual Therapy  [name] 11/05/2015, 6:06 PM

## 2015-11-05 NOTE — Progress Notes (Signed)
Subjective: Patient reports "I'm doing pretty good I think."  Objective: Vital signs in last 24 hours: Temp:  [97.6 F (36.4 C)-98.1 F (36.7 C)] 97.6 F (36.4 C) (07/19 0449) Pulse Rate:  [65-74] 65 (07/19 0449) Resp:  [17-18] 17 (07/19 0449) BP: (103-136)/(53-72) 116/63 mmHg (07/19 0449) SpO2:  [96 %] 96 % (07/19 0449)  Intake/Output from previous day: 07/18 0701 - 07/19 0700 In: 720 [P.O.:720] Out: 700 [Urine:700] Intake/Output this shift:    Awake, conversant, smiling. 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 Wife present is able to reorient him for short periods. His strength is good BLE, limited by Rt ankle fx. Incision now without any erythema, but serous drainage persists from uppermost portion. Drsg change 2 hours ago, now saturated 2 4x4's (decreasing gradually).   Lab Results:  Recent Labs  11/03/15 1114 11/04/15 0548  WBC 15.2* 13.1*  HGB 10.4* 9.2*  HCT 32.9* 29.4*  PLT 523* 480*   BMET  Recent Labs  11/03/15 1114 11/04/15 0548 11/05/15 0624  NA 133* 135  --   K 4.3 4.0  --   CL 97* 99*  --   CO2 27 31  --   GLUCOSE 181* 105*  --   BUN 10 9  --   CREATININE 0.65 0.68 0.57*  CALCIUM 8.5* 8.3*  --     Studies/Results: No results found.  Assessment/Plan:   LOS: 7 days  Continue rehab therapies, drsg changes as needed to keep incision dry, IVAB.    Verdis Prime 11/05/2015, 8:57 AM

## 2015-11-05 NOTE — Progress Notes (Signed)
Occupational Therapy Session Note  Patient Details  Name: Mitchell [name] MRN: [id number] Date of Birth: 10/16/1934  Today's Date: 11/05/2015 OT Individual Time: 1000-1115 OT Individual Time Calculation (min): 75 min    Short Term Goals: Week 1:  OT Short Term Goal 1 (Week 1): Pt will sit to stand with min-mod A with RW to prepare for LB dressing. OT Short Term Goal 2 (Week 1): Pt will don pants over feet with reacher with min A. OT Short Term Goal 3 (Week 1): Pt will bathe LB with long sponge with mod A. OT Short Term Goal 4 (Week 1): Pt will transfer to [facility] using scoot transfer with min A.  Skilled Therapeutic Interventions/Progress Updates:    Pt seen this session to facilitate functional mobility and activity tolerance. Pt was already dressed but needed to toilet. +2 A this am for one person to guide walker and support pt and other person to manage IV pole. Pt needed mod support during ambulation. Min A to stand from w/c, close S from elevated BSC. Total A with toileting skills. Pt impulsive with sitting when fatigued. Pt worked on gentle strengthening shoulder exercises and distal strength with light dowel bar and hand weights. Pt followed exercises well, but continued to demonstrate confusion and disorientation about where he is, that he had surgery, etc.   Pt in room with wife and all needs met.  Therapy Documentation Precautions:  Precautions Precautions: Fall, Back Precaution Comments: unable to recall 0/3 back precautions.  Required Braces or Orthoses: Spinal Brace Spinal Brace: Lumbar corset, Applied in sitting position (Pt only able to provide min assist for donning brace.) Restrictions Weight Bearing Restrictions: No Other Position/Activity Restrictions: CAM boot for RLE due to ankle fx, shoe for left foot Oxygen Therapy O2 Device: Not Delivered Pain: Pain Assessment Pain Assessment: No/denies pain ADL: ADL ADL Comments: Refer to functional navigator  See Function  Navigator for Current Functional Status.   Therapy/Group: Individual Therapy  Allen 11/05/2015, 12:20 PM

## 2015-11-06 ENCOUNTER — Inpatient Hospital Stay (HOSPITAL_COMMUNITY): Payer: Medicare Other

## 2015-11-06 ENCOUNTER — Inpatient Hospital Stay (HOSPITAL_COMMUNITY): Payer: Medicare Other | Admitting: Physical Therapy

## 2015-11-06 ENCOUNTER — Inpatient Hospital Stay (HOSPITAL_COMMUNITY): Payer: Medicare Other | Admitting: Occupational Therapy

## 2015-11-06 DIAGNOSIS — R451 Restlessness and agitation: Secondary | ICD-10-CM

## 2015-11-06 DIAGNOSIS — G8918 Other acute postprocedural pain: Secondary | ICD-10-CM

## 2015-11-06 MED ORDER — CLONAZEPAM 0.5 MG PO TABS
0.2500 mg | ORAL_TABLET | Freq: Every day | ORAL | Status: DC
  Administered 2015-11-06 – 2015-11-08 (×3): 0.25 mg via ORAL
  Filled 2015-11-06 (×3): qty 1

## 2015-11-06 MED ORDER — CLONAZEPAM 0.5 MG PO TABS: 0.2500 mg | ORAL_TABLET | Freq: Every day | ORAL | Status: DC

## 2015-11-06 MED ORDER — DULOXETINE HCL 20 MG PO CPEP
20.0000 mg | ORAL_CAPSULE | Freq: Every day | ORAL | Status: DC
  Filled 2015-11-06: qty 1

## 2015-11-06 MED ORDER — RISPERIDONE 0.25 MG PO TABS: 0.2500 mg | ORAL_TABLET | Freq: Every day | ORAL | Status: DC

## 2015-11-06 MED ORDER — LORAZEPAM 2 MG/ML IJ SOLN
0.5000 mg | Freq: Once | INTRAMUSCULAR | Status: AC
  Administered 2015-11-06: 0.5 mg via INTRAVENOUS

## 2015-11-06 MED ORDER — LEVOFLOXACIN 750 MG PO TABS
750.0000 mg | ORAL_TABLET | Freq: Every day | ORAL | Status: DC
  Administered 2015-11-06: 750 mg via ORAL
  Filled 2015-11-06: qty 1

## 2015-11-06 MED ORDER — LEVOFLOXACIN 500 MG PO TABS
500.0000 mg | ORAL_TABLET | Freq: Every day | ORAL | Status: AC
  Administered 2015-11-07 – 2015-11-13 (×7): 500 mg via ORAL
  Filled 2015-11-06 (×8): qty 1

## 2015-11-06 NOTE — Progress Notes (Signed)
[place] PHYSICAL MEDICINE & REHABILITATION     PROGRESS NOTE  Subjective/Complaints:  Pt seen sleeping in bed.  Wife states he had a really good day yesterday, but then became anxious at night and had issues with his IV leaking x2 and finally slept at 3:45AM.     ROS: Denies CP, SOB, N/V/D.  Objective: Vital Signs: Blood pressure 143/68, pulse 66, temperature 97.9 F (36.6 C), temperature source Oral, resp. rate 18, height 6\' 3"  (1.905 m), weight 127.824 kg (281 lb 12.8 oz), SpO2 95 %. No results found.  Recent Labs  11/03/15 1114 11/04/15 0548  WBC 15.2* 13.1*  HGB 10.4* 9.2*  HCT 32.9* 29.4*  PLT 523* 480*    Recent Labs  11/03/15 1114 11/04/15 0548 11/05/15 0624  NA 133* 135  --   K 4.3 4.0  --   CL 97* 99*  --   GLUCOSE 181* 105*  --   BUN 10 9  --   CREATININE 0.65 0.68 0.57*  CALCIUM 8.5* 8.3*  --    CBG (last 3)   Recent Labs  11/04/15 1104  GLUCAP 159*    Wt Readings from Last 3 Encounters:  11/06/15 127.824 kg (281 lb 12.8 oz)  10/24/15 122.471 kg (270 lb)  10/16/15 122.471 kg (270 lb)    Physical Exam:  BP 143/68 mmHg  Pulse 66  Temp(Src) 97.9 F (36.6 C) (Oral)  Resp 18  Ht 6\' 3"  (1.905 m)  Wt 127.824 kg (281 lb 12.8 oz)  BMI 35.22 kg/m2  SpO2 95% Constitutional: He appears well-developed and well-nourished.  HENT: Normocephalic and atraumatic.  Eyes: Conjunctivae are normal. Cardiovascular: Normal rate and regular rhythm.  Respiratory: Effort normal and breath sounds normal. No stridor. No respiratory distress. He has no wheezes.  GI: Soft. Bowel sounds are normal. He exhibits no distension. There is no tenderness.  Musculoskeletal: He exhibits edema (1+ edema right ankle). He exhibits no tenderness.  Neurological: He is alert and oriented x1. HOH Motor: B/l UE: 4+/5 grossly proximal to distal RLE: hip flexion, knee extension 4+/5, ankle dorsi/plantarflexion 4-/5 LLE: hip flexion, knee extension 4+/5, ankle dorsi/plantar  flexion 4+/5  Skin: Skin is warm and dry. No erythema. Back incision with copious serosanguinous drainage from proximal small opening (unchanged).  Psychiatric: He has a normal mood and affect. Thought content normal. Confused   Assessment/Plan: 1. Functional deficits secondary to lumbar radiculopathy L2/3 and kyphosis with L4/5 spondylolisthesis with hx of right foot drop which require 3+ hours per day of interdisciplinary therapy in a comprehensive inpatient rehab setting. Physiatrist is providing close team supervision and 24 hour management of active medical problems listed below. Physiatrist and rehab team continue to assess barriers to discharge/monitor patient progress toward functional and medical goals.  Function:  Bathing Bathing position   Position: Sitting EOB  Bathing parts Body parts bathed by patient: Right arm, Left arm, Chest, Abdomen, Front perineal area, Right upper leg, Left upper leg Body parts bathed by helper: Buttocks, Right lower leg, Left lower leg, Back  Bathing assist Assist Level:  (patient completed 40%, max assist)      Upper Body Dressing/Undressing Upper body dressing   What is the patient wearing?: Pull over shirt/dress     Pull over shirt/dress - Perfomed by patient: Thread/unthread right sleeve, Thread/unthread left sleeve Pull over shirt/dress - Perfomed by helper: Put head through opening, Pull shirt over trunk Button up shirt - Perfomed by patient: Thread/unthread right sleeve, Thread/unthread left sleeve, Button/unbutton shirt Button  up shirt - Perfomed by helper: Pull shirt around back    Upper body assist Assist Level:  (75%, min assist)      Lower Body Dressing/Undressing Lower body dressing   What is the patient wearing?: Pants, Underwear, [employer], Shoes Underwear - Performed by patient: Thread/unthread right underwear leg, Thread/unthread left underwear leg Underwear - Performed by helper: Pull underwear up/down Pants- Performed by  patient: Thread/unthread right pants leg, Thread/unthread left pants leg Pants- Performed by helper: Pull pants up/down           Shoes - Performed by helper: Don/doff right shoe, Don/doff left shoe, Fasten right, Fasten left       TED Hose - Performed by helper: Don/doff right TED hose, Don/doff left TED hose  Lower body assist Assist for lower body dressing: Touching or steadying assistance (Pt > 75%)      Toileting Toileting Toileting activity did not occur: Safety/medical concerns   Toileting steps completed by helper: Adjust clothing prior to toileting, Performs perineal hygiene, Adjust clothing after toileting    Toileting assist Assist level: Two helpers   Transfers Chair/bed transfer   Chair/bed transfer method: Stand pivot Chair/bed transfer assist level: Moderate assist (Pt 50 - 74%/lift or lower) Chair/bed transfer assistive device: Armrests, Medical sales representative     Max distance: 75ft Assist level: 2 helpers   Wheelchair   Type: Manual Max wheelchair distance: 152ft Assist Level: Touching or steadying assistance (Pt > 75%)  Cognition Comprehension Comprehension assist level: Follows basic conversation/direction with no assist  Expression Expression assist level: Expresses basic needs/ideas: With no assist  Social Interaction Social Interaction assist level: Interacts appropriately 50 - 74% of the time - May be physically or verbally inappropriate.  Problem Solving Problem solving assist level: Solves basic 50 - 74% of the time/requires cueing 25 - 49% of the time  Memory Memory assist level: Recognizes or recalls 25 - 49% of the time/requires cueing 50 - 75% of the time     Medical Problem List and Plan: 1. Gait and cognitive deficits with weakness secondary to lumbar radiculopathy L2/3 and kyphosis with L4/5 spondylolisthesis with hx of right foot drop  Cont CIR 2. DVT Prophylaxis/Anticoagulation: Pharmaceutical: Lovenox added as 5 days  post op.  3. Pain Management:  Scheduled ultram qid for now, decreased dose on 7/19.  Will continue oxycodone prn for now.   May need to add/increase night time medication dose 4. Mood: LCSW to follow for evaluation and support.  5. Neuropsych: This patient is not capable of making decisions on his own behalf.  Will add Klonopin qhs 6. Skin/Wound Care: Monitor wound for healing. Maintain adequate nutrition and hydration status.   Surgical wound infection. Appreciate Neurosurg and ID recs.    Cont IV Abx.    Will follow up regarding dressing changes and need for wound dressing 7. Fluids/Electrolytes/Nutrition: Monitor I/O.   Hyponatremia: 135 on 7/18  Labs ordered for tomorrow 8. HTN: Monitor BP bid. On amlodipine and atenolol.  9. Ureteral stricture: Currently voiding without difficulty Monitor I/O.  10. Ankle fracture: To wear CAM boot when standing/walking. 11. Depression/Anxiety: On cymbalta 12. GERD: On protonix.  13. Asthma: Encourage IS. On dulera for hyperactive airway.  14. Constipation: Increased miralax to bid. 15. CAD: Cont meds 16. Obesity: Body mass index is 33.75 kg/(m^2) on admission, diet and exercise education, encourage weight loss to increase endurance and promote overall health 17. Right ankle fracture: Cont boot 18. Chronic back  pain: With severe central canal stenosis and foraminal narrowing: Cont meds 19. Right RTC tear and DJD right shoulder: Cont meds 20. Cognitive deficits with difficulty processing: Likely secondary to narcotics +/- infection. 21. Leukocytosis  Wound cx with Pseudomonas and Klebseilla.  WBCs 13.1  on 7/18 (improved with abx)  UA neg, Ucx multiple species  Will cont to monitor  Awaiting ID input 22. ABLA  Hb 9.2 on 7/18  Cont to monitor  Labs ordered for tomorrow 13. Confusion  Likely secondary to infection +/- medications  Will cont to monitor, medications also reviewed, pain medications  adjusted.  LOS (Days) 8 A FACE TO FACE EVALUATION WAS PERFORMED  Ankit [name] 11/06/2015 8:42 AM

## 2015-11-06 NOTE — Progress Notes (Signed)
Occupational Therapy Session Note  Patient Details  Name: Mitchell [name] MRN: [id number] Date of Birth: Jul 11, 1934  Today's Date: 11/06/2015     Patient in deep sleep upon approach.   Wife requested this clinician not to wake patient as he stated he was agitated last night and did not go to sleep until 3:45 this am.    She stated that patient is very confused and likely to become combative and confused if awoken this morning.  She shared patient's prior level of status and stated patient used to sleep 8-10 hours per night.  She strongly suggested that he be allowed to rest this morning and get more sleep as she stated she thought the rest would allow him to cognitively and emotionallly participate more appropriately and with a less combative and confused state of mind.  Wife continued to share patient prior level of function with this clinician.   Therapy Documentation Precautions:  Precautions Precautions: Fall, Back Precaution Comments: unable to recall 0/3 back precautions.  Required Braces or Orthoses: Spinal Brace Spinal Brace: Lumbar corset, Applied in sitting position (Pt only able to provide min assist for donning brace.) Restrictions Weight Bearing Restrictions: No Other Position/Activity Restrictions: CAM boot for RLE due to ankle fx, shoe for left foot   General OT Amount of Missed Time: 45 Minutes   Pain:undetected - patient in deep sleep    See Function Navigator for Current Functional Status.   Therapy/Group: Individual Therapy  Alfredia Ferguson [facility] 11/06/2015, 8:58 AM

## 2015-11-06 NOTE — Progress Notes (Signed)
Occupational Therapy Session Note  Patient Details  Name: Mitchell [name] MRN: [id number] Date of Birth: 1934-10-08  Today's Date: 11/06/2015 OT Individual Time: [id number] OT Individual Time Calculation (min): 60 min    Short Term Goals: Week 1:  OT Short Term Goal 1 (Week 1): Pt will sit to stand with min-mod A with RW to prepare for LB dressing. OT Short Term Goal 2 (Week 1): Pt will don pants over feet with reacher with min A. OT Short Term Goal 3 (Week 1): Pt will bathe LB with long sponge with mod A. OT Short Term Goal 4 (Week 1): Pt will transfer to [facility] using scoot transfer with min A.  Skilled Therapeutic Interventions/Progress Updates:    Pt sound asleep at start of session, pt had not slept well and wife requested that he be able to sleep through previous OT session. Pt was able to be aroused awake fairly easily. Pt seen this session to work with AE and focus on standing balance. Pt sat to EOB to complete UB self care with min A and then used long sponge to wash feet, reacher for donning pants with mod A over feet.  Bed needed to be elevated for pt to stand. +2 A during standing for safety as pt cleansed front perineal area and during clothing adjustment.  Pt needs assist to keep R foot in alignment. Pt then worked on sustained standing 2 more times for at least 1 min first trial, 1.5 min second trial. Pt has very low standing tolerance.  Pt resting in w/c with wife with him at the end of session to eat breakfast.  Therapy Documentation Precautions:  Precautions Precautions: Fall, Back Precaution Comments: unable to recall 0/3 back precautions.  Required Braces or Orthoses: Spinal Brace Spinal Brace: Lumbar corset, Applied in sitting position (Pt only able to provide min assist for donning brace.) Restrictions Weight Bearing Restrictions: No Other Position/Activity Restrictions: CAM boot for RLE due to ankle fx, shoe for left foot   Pain: Pain Assessment Pain Assessment:  No/denies pain  ADL: ADL ADL Comments: Refer to functional navigator  See Function Navigator for Current Functional Status.   Therapy/Group: Individual Therapy  [place] 11/06/2015, 12:14 PM

## 2015-11-06 NOTE — Progress Notes (Signed)
Patient ID: Mitchell [name], male   DOB: 1934-09-10, 81 y.o.   MRN: [id number] Sleeping soundly after being up most of the night. Wife present reports he worked well with PT several times yesterday, before becoming more confused last evening. Up throughout the night with confusion and ?leaking IV vs urinary incontinence?  Drsg not changed with this visit as pt finally sleeping. Depending on ID's tx recommendations, may be better served with PICC for access.  Verdis Prime RN BSN  Left message at ID office in f/u to consult request @[phone] .

## 2015-11-06 NOTE — Progress Notes (Signed)
Physical Therapy Session Note  Patient Details  Name: Mitchell [name] MRN: [id number] Date of Birth: 06/11/1934  Today's Date: 11/06/2015 PT Individual Time: 1114-1204 AND 1417-1501 PT Individual Time Calculation (min): 50 min AND 44 min   Short Term Goals: Week 1:  PT Short Term Goal 1 (Week 1): Patient will consistent perform stand pivot transfer and sit<>stand with Mod A and RW.  PT Short Term Goal 2 (Week 1): Patient will performed bed mobility with min A.  PT Short Term Goal 3 (Week 1): Patient will  ambulate 69f with Mod A from PT with RW  PT Short Term Goal 4 (Week 1): Patient will perform car transfer with max A and RW.   Skilled Therapeutic Interventions/Progress Updates:    [facility] after he finished his breakfast in 10 minutes. PT returned after 10 minutes and patient requested to utilize BR. PT instructed patient in toilet [facility] to [facility] with mod A +2 for AD management and safety. PT required to manage clothing pre and post bowel movement as well as perform Perineal hygiene with [facility].   Sit<>stand transfer training with mod A from PT x 6 throughout therapy with moderate cues for improved use of UE and proper LE positioning. Patient noted to have improved safety with transfer with increased awareness for proper UE positioning for all transfers.   Gait trianing with mod A +2 for WC  Follow for 25 ft. Additional gait training for 27fx 2 with mod A and mod cues for improved step length bilaterally and control of R LE to prevent genu recurvatum.   Seated Therex including AROM hip flexion x 10 BLE, LAQ with level 2 tband x 12, HS curl with level 2 tband x 12, hip abduction against level 2 tband x 12.  PT provided mod cues for improved ROM, increased Eccentric control and decreased compensation from trunk.    Patient returned to bed with mod A stand pivot transfer with RW. Sit<>supine with mod A with  PT to assist BLE into bed. Patient left supine in bed with call bell in reach.   Session 2.    Patient received supine in bed and agreeable to PT following bowel movement. PT instructed patient in supine>sit transfer through log roll with mod A and cues for improved use of R UE to prevent twisting of back. Patient transferred to [facility] stand pivot with RW and Mod A from PT from normal bed height. Mod cues for proper weight shift and UE placement. Patient performed toilet transfer with mod A from PT with stand pivot technique to [facility] toilet. PT required to manage clothing pre and post bowel movement as well as perform Perineal hygiene with patient stood at RWSun[facility] Partnershipor 4 minutes.   Gait training instructed by PT in hall for [id number] minA progressing to mod A, as well as cues for AD management and increased step length R >L.   Patient returned to bed with Mod A stand pivot transfer with RW and mod cues for improved AD management around medical equipment near bed. Sit>supine transfer through log roll with mod A for BLE management. Patient performed roll R and L with min A and use of bed rails to doff Back brace.  Patient left with call bell within reach and all needs met.      Therapy Documentation Precautions:  Precautions Precautions: Fall, Back Precaution Comments: unable to  recall 0/3 back precautions.  Required Braces or Orthoses: Spinal Brace Spinal Brace: Lumbar corset, Applied in sitting position (Pt only able to provide min assist for donning brace.) Restrictions Weight Bearing Restrictions: No Other Position/Activity Restrictions: CAM boot for RLE due to ankle fx, shoe for left foot Pain:   4/10   See Function Navigator for Current Functional Status.   Therapy/Group: Individual Therapy  [name] 11/06/2015, 5:47 PM

## 2015-11-06 NOTE — Progress Notes (Signed)
ANTIBIOTIC CONSULT NOTE - INITIAL  Pharmacy Consult for vancomycin and cefepime Indication: wound infection s/p surgery for lumbar stenosis  Allergies  Allergen Reactions  . Statins Other (See Comments)    Leg pain  . Adhesive [Tape] Itching and Rash    Please use "paper" tape    Patient Measurements: Height: 6\' 3"  (190.5 cm) Weight: 281 lb 12.8 oz (127.824 kg) IBW/kg (Calculated) : 84.5 Adjusted Body Weight:   Vital Signs: Temp: 97.9 F (36.6 C) (07/20 0348) Temp Source: Oral (07/20 0348) BP: 143/68 mmHg (07/20 0348) Pulse Rate: 66 (07/20 0348) Intake/Output from previous day: 07/19 0701 - 07/20 0700 In: 720 [P.O.:720] Out: 600 [Urine:600] Intake/Output from this shift:    Labs:  Recent Labs  11/03/15 1114 11/04/15 0548 11/05/15 0624  WBC 15.2* 13.1*  --   HGB 10.4* 9.2*  --   PLT 523* 480*  --   CREATININE 0.65 0.68 0.57*   Estimated Creatinine Clearance: 107.8 mL/min (by C-G formula based on Cr of 0.57). No results for input(s): VANCOTROUGH, VANCOPEAK, VANCORANDOM, GENTTROUGH, GENTPEAK, GENTRANDOM, TOBRATROUGH, TOBRAPEAK, TOBRARND, AMIKACINPEAK, AMIKACINTROU, AMIKACIN in the last 72 hours.   Microbiology: Recent Results (from the past 720 hour(s))  Surgical pcr screen     Status: None   Collection Time: 10/16/15  1:11 PM  Result Value Ref Range Status   MRSA, PCR NEGATIVE NEGATIVE Final   Staphylococcus aureus NEGATIVE NEGATIVE Final    Comment:        The Xpert SA Assay (FDA approved for NASAL specimens in patients over 3 years of age), is one component of a comprehensive surveillance program.  Test performance has been validated by [facility] for patients greater than or equal to 14 year old. It is not intended to diagnose infection nor to guide or monitor treatment.   Urine culture     Status: Abnormal   Collection Time: 10/30/15  6:02 PM  Result Value Ref Range Status   Specimen Description URINE, CLEAN CATCH  Final   Special Requests  NONE  Final   Culture MULTIPLE SPECIES PRESENT, SUGGEST RECOLLECTION (A)  Final   Report Status 10/31/2015 FINAL  Final  Aerobic Culture (superficial specimen)     Status: None   Collection Time: 11/02/15 11:19 AM  Result Value Ref Range Status   Specimen Description WOUND BACK  Final   Special Requests Normal  Final   Gram Stain   Final    MODERATE WBC PRESENT, PREDOMINANTLY PMN FEW GRAM NEGATIVE RODS RARE GRAM POSITIVE COCCI IN CLUSTERS    Culture   Final    ABUNDANT PSEUDOMONAS AERUGINOSA MODERATE KLEBSIELLA PNEUMONIAE    Report Status 11/05/2015 FINAL  Final   Organism ID, Bacteria PSEUDOMONAS AERUGINOSA  Final   Organism ID, Bacteria KLEBSIELLA PNEUMONIAE  Final      Susceptibility   Klebsiella pneumoniae - MIC*    AMPICILLIN 16 RESISTANT Resistant     CEFAZOLIN <=4 SENSITIVE Sensitive     CEFEPIME <=1 SENSITIVE Sensitive     CEFTAZIDIME <=1 SENSITIVE Sensitive     CEFTRIAXONE <=1 SENSITIVE Sensitive     CIPROFLOXACIN <=0.25 SENSITIVE Sensitive     GENTAMICIN <=1 SENSITIVE Sensitive     IMIPENEM <=0.25 SENSITIVE Sensitive     TRIMETH/SULFA <=20 SENSITIVE Sensitive     AMPICILLIN/SULBACTAM 8 SENSITIVE Sensitive     PIP/TAZO 8 SENSITIVE Sensitive     * MODERATE KLEBSIELLA PNEUMONIAE   Pseudomonas aeruginosa - MIC*    CEFTAZIDIME 2 SENSITIVE Sensitive  CIPROFLOXACIN <=0.25 SENSITIVE Sensitive     GENTAMICIN <=1 SENSITIVE Sensitive     IMIPENEM 1 SENSITIVE Sensitive     PIP/TAZO 8 SENSITIVE Sensitive     CEFEPIME <=1 SENSITIVE Sensitive     * ABUNDANT PSEUDOMONAS AERUGINOSA    Medical History: Past Medical History  Diagnosis Date  . Coronary artery disease     2D ECHO, 08/24/2011 - EF >55%; NUCLEAR STRESS TEST, 04/21/2010 - mild ischemia in Basal Inferolateral and Mid Inferolateral regions, post-stress EF 56%,   . Arthritis   . Melanoma ([place])     l neck  . Adenomatous polyp   . Shortness of breath   . Prostatitis   . Dysrhythmia     atrial fibrilation  .  Pneumonia   . Spinal stenosis   . Atrial flutter with rapid ventricular response ([place]) 05/25/2011  . SOB (shortness of breath), secondary to SOB 05/25/2011  . LV dysfunction, EF by ECHO 40-45% 04/29/11 05/25/2011  . Acute on chronic systolic heart failure, usually asymptomatic but with tachycardia HF became acute now resolved 05/26/2011  . Sleep apnea     no longer has since loss of 70 pounds  . Anxiety   . GERD (gastroesophageal reflux disease)     takes Pantoprazole daily  . Constipation     takes Miralax daily as needed  . Depression     takes Cymbalta daily  . Hypertension     takes Amlodipine and Atenolol daily  . Asthma     Symbicort daily as needed  . Hyperlipidemia     takes Zetia daily  . CHF, acute, mild secondary to EF 40-45% an a. flutter with RVR 05/25/2011    takes Furosemide daily as needed  . Ankle fracture     right  . History of blood transfusion 53  yrs ago    got hives    Medications:  Scheduled:  . amLODipine  5 mg Oral Daily  . atenolol  12.5 mg Oral BID  . calcium carbonate  1,250 mg Oral BID WC  . ceFEPime (MAXIPIME) IV  2 g Intravenous Q8H  . docusate sodium  100 mg Oral BID  . DULoxetine  30 mg Oral Daily  . enoxaparin (LOVENOX) injection  40 mg Subcutaneous Q24H  . ezetimibe  10 mg Oral Daily  . famotidine  20 mg Oral BID  . furosemide  20 mg Oral QODAY  . LORazepam  0.5 mg Intravenous Once  . mometasone-formoterol  2 puff Inhalation BID  . pantoprazole  40 mg Oral Daily  . polyethylene glycol  17 g Oral BID  . traMADol  25 mg Oral TID WC & HS  . vancomycin  1,250 mg Intravenous Q12H   Infusions:   Assessment: 81 yo male with wound infection s/p surgery for lumbar stenosis is currently on vancomycin and cefepime.  Back wound cx growing psuedomonas and Klebsiella.  Pending ID consult for antibiotic choice.  Goal of Therapy:  Vancomycin trough level 15-20 mcg/ml  Plan:  - Continue vancomycin 1250 mg iv q12h; trough tonight - Cefepime 2g iv q8h -  follow up on ABX plan   Donell Tomkins, Tsz-Yin 11/06/2015,8:18 AM

## 2015-11-06 NOTE — Consult Note (Signed)
[name] for Infectious Disease    Date of Admission:  10/29/2015   Dat 4 of vancomycin and cefepime       Reason for Consult: Superficial surgical site infection    Referring Physician: Erline Levine Primary Care Physician: Tedra Senegal  Principal Problem:   Intervertebral disc disorder with radiculopathy of lumbar region Active Problems:   Surgical wound infection   Acute blood loss anemia   Debility   Postoperative confusion   Surgical wound dehiscence   Abnormality of gait   Hyponatremia   Leukocytosis   Agitation   . amLODipine  5 mg Oral Daily  . atenolol  12.5 mg Oral BID  . calcium carbonate  1,250 mg Oral BID WC  . clonazePAM  0.25 mg Oral QHS  . docusate sodium  100 mg Oral BID  . DULoxetine  30 mg Oral Daily  . enoxaparin (LOVENOX) injection  40 mg Subcutaneous Q24H  . ezetimibe  10 mg Oral Daily  . famotidine  20 mg Oral BID  . furosemide  20 mg Oral QODAY  . levofloxacin  750 mg Oral Daily  . LORazepam  0.5 mg Intravenous Once  . mometasone-formoterol  2 puff Inhalation BID  . pantoprazole  40 mg Oral Daily  . polyethylene glycol  17 g Oral BID  . traMADol  25 mg Oral TID WC & HS    Recommendations: 1. Discontinue vancomycin and cefepime and start levaquin PO 2. We will plan to follow up his progress early next week  Assessment: Surgical site infection: [name] appears to be improving on IV antibiotics for a soft tissue infection at the site of his recent lumbar spine surgery. He is afebrile without obvious systemic symptoms or worsening back pain. There is small wound dehiscence and pretty large amount of serous drainage but no purulence or fluctuance. Superficial wound culture grew sensitive pseudomonas and klebsiella but wounds are at risk for polymicrobial infection with skin flora as well as GNRs. Oral levaquin would be a good alternative to cover these bugs plus some streptococcal species and avoid the need for a PICC.   HPI:  Mitchell [name] is a 81 y.o. male with a medical history of CAD, CHF, multiple joint replacements for arthritis, recent right ankle fracture, and urethral stricture who came to [facility] on 7/6 for decompression/fusion surgery due to his progressive back pain with radiculopathy. He tolerated the procedure well but has had mildly delayed wound healing. After transfer to inpatient rehab he was noted to have an increase in serous drainage and erythema without accompanying worsening fever, leukocytosis, or pain. Superficial would cultures were obtained showing klebsiella and pseudomonas and he was started on vancomycin and cefepime now day 4 of treatment. There has been improvement in drainage and erythema. He continues to have some waxing and waning confusion throughout the hospital course.  Review of Systems: Review of Systems  Constitutional: Negative for fever.  Eyes: Negative for photophobia.  Respiratory: Negative for cough.   Gastrointestinal: Negative for abdominal pain.  Genitourinary: Positive for frequency.  Musculoskeletal: Positive for back pain.  Skin: Negative for rash.  Neurological: Negative for focal weakness and headaches.  Endo/Heme/Allergies: Bruises/bleeds easily.  Psychiatric/Behavioral: The patient has insomnia.     Past Medical History  Diagnosis Date  . Coronary artery disease     2D ECHO, 08/24/2011 - EF >55%; NUCLEAR STRESS TEST, 04/21/2010 - mild ischemia in Basal Inferolateral and Mid Inferolateral regions, post-stress EF  56%,   . Arthritis   . Melanoma ([place])     l neck  . Adenomatous polyp   . Shortness of breath   . Prostatitis   . Dysrhythmia     atrial fibrilation  . Pneumonia   . Spinal stenosis   . Atrial flutter with rapid ventricular response ([place]) 05/25/2011  . SOB (shortness of breath), secondary to SOB 05/25/2011  . LV dysfunction, EF by ECHO 40-45% 04/29/11 05/25/2011  . Acute on chronic systolic heart failure, usually asymptomatic but with tachycardia HF became  acute now resolved 05/26/2011  . Sleep apnea     no longer has since loss of 70 pounds  . Anxiety   . GERD (gastroesophageal reflux disease)     takes Pantoprazole daily  . Constipation     takes Miralax daily as needed  . Depression     takes Cymbalta daily  . Hypertension     takes Amlodipine and Atenolol daily  . Asthma     Symbicort daily as needed  . Hyperlipidemia     takes Zetia daily  . CHF, acute, mild secondary to EF 40-45% an a. flutter with RVR 05/25/2011    takes Furosemide daily as needed  . Ankle fracture     right  . History of blood transfusion 53  yrs ago    got hives    Social History  Substance Use Topics  . Smoking status: Never Smoker   . Smokeless tobacco: Former Systems developer    Types: [place] date: 09/16/1972  . Alcohol Use: No    Family History  Problem Relation Age of Onset  . Heart failure Mother 72  . Heart failure Father 62  . Cancer Paternal Grandfather    Allergies  Allergen Reactions  . Statins Other (See Comments)    Leg pain  . Adhesive [Tape] Itching and Rash    Please use "paper" tape    OBJECTIVE: Blood pressure 143/68, pulse 66, temperature 97.9 F (36.6 C), temperature source Oral, resp. rate 18, height 6\' 3"  (1.905 m), weight 127.824 kg (281 lb 12.8 oz), SpO2 95 %.  Physical Exam GENERAL- alert, co-operative, NAD HEENT- Atraumatic, oral mucosa appears moist CARDIAC- RRR, no murmurs RESP- CTAB, no wheezes or crackles. ABDOMEN- Soft, nontender, no guarding or rebound BACK- Vertical incision closed excent approximately 2cm length on superior end, small slough vs granulation tissue  On exposed edge, serous drainage on dressing without purulence or odor, little or not surrounding erythema, no fluctuance or induration EXTREMITIES- right leg in brace, left leg in compression stocking SKIN- Scattered bruises on forearms PSYCH- Normal mood and affect, some tangential speech and intermittent confusion   Lab Results Lab Results    Component Value Date   WBC 13.1* 11/04/2015   HGB 9.2* 11/04/2015   HCT 29.4* 11/04/2015   MCV 97.4 11/04/2015   PLT 480* 11/04/2015    Lab Results  Component Value Date   CREATININE 0.57* 11/05/2015   BUN 9 11/04/2015   NA 135 11/04/2015   K 4.0 11/04/2015   CL 99* 11/04/2015   CO2 31 11/04/2015    Lab Results  Component Value Date   ALT 10* 10/30/2015   AST 22 10/30/2015   ALKPHOS 80 10/30/2015   BILITOT 0.4 10/30/2015     Microbiology: Recent Results (from the past 240 hour(s))  Urine culture     Status: Abnormal   Collection Time: 10/30/15  6:02 PM  Result Value Ref Range Status  Specimen Description URINE, CLEAN CATCH  Final   Special Requests NONE  Final   Culture MULTIPLE SPECIES PRESENT, SUGGEST RECOLLECTION (A)  Final   Report Status 10/31/2015 FINAL  Final  Aerobic Culture (superficial specimen)     Status: None   Collection Time: 11/02/15 11:19 AM  Result Value Ref Range Status   Specimen Description WOUND BACK  Final   Special Requests Normal  Final   Gram Stain   Final    MODERATE WBC PRESENT, PREDOMINANTLY PMN FEW GRAM NEGATIVE RODS RARE GRAM POSITIVE COCCI IN CLUSTERS    Culture   Final    ABUNDANT PSEUDOMONAS AERUGINOSA MODERATE KLEBSIELLA PNEUMONIAE    Report Status 11/05/2015 FINAL  Final   Organism ID, Bacteria PSEUDOMONAS AERUGINOSA  Final   Organism ID, Bacteria KLEBSIELLA PNEUMONIAE  Final      Susceptibility   Klebsiella pneumoniae - MIC*    AMPICILLIN 16 RESISTANT Resistant     CEFAZOLIN <=4 SENSITIVE Sensitive     CEFEPIME <=1 SENSITIVE Sensitive     CEFTAZIDIME <=1 SENSITIVE Sensitive     CEFTRIAXONE <=1 SENSITIVE Sensitive     CIPROFLOXACIN <=0.25 SENSITIVE Sensitive     GENTAMICIN <=1 SENSITIVE Sensitive     IMIPENEM <=0.25 SENSITIVE Sensitive     TRIMETH/SULFA <=20 SENSITIVE Sensitive     AMPICILLIN/SULBACTAM 8 SENSITIVE Sensitive     PIP/TAZO 8 SENSITIVE Sensitive     * MODERATE KLEBSIELLA PNEUMONIAE   Pseudomonas  aeruginosa - MIC*    CEFTAZIDIME 2 SENSITIVE Sensitive     CIPROFLOXACIN <=0.25 SENSITIVE Sensitive     GENTAMICIN <=1 SENSITIVE Sensitive     IMIPENEM 1 SENSITIVE Sensitive     PIP/TAZO 8 SENSITIVE Sensitive     CEFEPIME <=1 SENSITIVE Sensitive     * ABUNDANT PSEUDOMONAS AERUGINOSA    Collier Salina, MD PGY-II Internal Medicine Resident Pager# [phone] 11/06/2015, 1:17 PM

## 2015-11-06 NOTE — Progress Notes (Signed)
Daughter expressed concerns regarding patient's mentation and agitation issues. She did report that he was becoming  confused and severely depressed prior to surgery. He was started on cymbalta about 6 weeks prior to admission.  Reports of sundowning during hospitalization --questioned antianxiety medication for patient.  Will discontinue cymbalta and see if this helps improve mentation.

## 2015-11-07 ENCOUNTER — Inpatient Hospital Stay (HOSPITAL_COMMUNITY): Payer: Medicare Other | Admitting: Physical Therapy

## 2015-11-07 ENCOUNTER — Inpatient Hospital Stay (HOSPITAL_COMMUNITY): Payer: Medicare Other | Admitting: Occupational Therapy

## 2015-11-07 DIAGNOSIS — G479 Sleep disorder, unspecified: Secondary | ICD-10-CM | POA: Diagnosis present

## 2015-11-07 DIAGNOSIS — R410 Disorientation, unspecified: Secondary | ICD-10-CM | POA: Insufficient documentation

## 2015-11-07 LAB — CBC WITH DIFFERENTIAL/PLATELET
Basophils Absolute: 0 10*3/uL (ref 0.0–0.1)
Basophils Relative: 0 %
Eosinophils Absolute: 0.1 10*3/uL (ref 0.0–0.7)
Eosinophils Relative: 1 %
HCT: 33.7 % — ABNORMAL LOW (ref 39.0–52.0)
Hemoglobin: 10.4 g/dL — ABNORMAL LOW (ref 13.0–17.0)
Lymphocytes Relative: 12 %
Lymphs Abs: 1.8 10*3/uL (ref 0.7–4.0)
MCH: 30 pg (ref 26.0–34.0)
MCHC: 30.9 g/dL (ref 30.0–36.0)
MCV: 97.1 fL (ref 78.0–100.0)
Monocytes Absolute: 1.2 10*3/uL — ABNORMAL HIGH (ref 0.1–1.0)
Monocytes Relative: 8 %
Neutro Abs: 11.2 10*3/uL — ABNORMAL HIGH (ref 1.7–7.7)
Neutrophils Relative %: 79 %
Platelets: 567 10*3/uL — ABNORMAL HIGH (ref 150–400)
RBC: 3.47 MIL/uL — ABNORMAL LOW (ref 4.22–5.81)
RDW: 14 % (ref 11.5–15.5)
WBC: 14.3 10*3/uL — ABNORMAL HIGH (ref 4.0–10.5)

## 2015-11-07 LAB — BASIC METABOLIC PANEL
Anion gap: 9 (ref 5–15)
BUN: 6 mg/dL (ref 6–20)
CO2: 26 mmol/L (ref 22–32)
Calcium: 8.8 mg/dL — ABNORMAL LOW (ref 8.9–10.3)
Chloride: 97 mmol/L — ABNORMAL LOW (ref 101–111)
Creatinine, Ser: 0.68 mg/dL (ref 0.61–1.24)
GFR calc Af Amer: >60 mL/min (ref >60)
GFR calc non Af Amer: >60 mL/min (ref >60)
Glucose, Bld: 134 mg/dL — ABNORMAL HIGH (ref 65–99)
Potassium: 4.4 mmol/L (ref 3.5–5.1)
Sodium: 132 mmol/L — ABNORMAL LOW (ref 135–145)

## 2015-11-07 LAB — AMMONIA: Ammonia: 31 umol/L (ref 9–35)

## 2015-11-07 MED ORDER — QUETIAPINE FUMARATE 25 MG PO TABS
12.5000 mg | ORAL_TABLET | Freq: Every day | ORAL | Status: DC
  Administered 2015-11-07 – 2015-11-11 (×5): 12.5 mg via ORAL
  Filled 2015-11-07 (×6): qty 1

## 2015-11-07 MED ORDER — LORAZEPAM 0.5 MG PO TABS
0.2500 mg | ORAL_TABLET | Freq: Once | ORAL | Status: AC
  Administered 2015-11-07: 0.25 mg via ORAL
  Filled 2015-11-07: qty 1

## 2015-11-07 NOTE — Progress Notes (Signed)
Occupational Therapy Weekly Progress Note & Session Note  Patient Details  Name: Mitchell [name] MRN: [id number] Date of Birth: [birth year]  Beginning of progress report period: September 30, 2015 End of progress report period: October 08, 2015  Today's Date: 11/07/2015  Patient has met 0 of 4 short term goals.  Pt making slow progress so far due to decreased cognition and increased confusion. Pt oriented to self only. Even after explaining and trying to orient him to place, time, situation, pt unable to hold onto this information. Pt with increased confusion at night and was even put in restraints last night due to agitation. So far, plan is for patient to discharge home with wife on 11/19/15. Wife has been present   Patient continues to demonstrate the following deficits: decreased cognition, increased confusion, decreased ADL independence, decreased independence with functional mobility (including sit to/from stands and ambulation), and decreased overall activity tolerance/endurance. Therefore, patient will continue to benefit from skilled OT intervention to enhance overall performance with BADL, iADL and Reduce care partner burden.  Patient slowly progressing towards LTGs, will continue plan of care for now and change plan of care or goals as appropriate.  OT Short Term Goals Week 1:  OT Short Term Goal 1 (Week 1): Pt will sit to stand with min-mod A with RW to prepare for LB dressing. OT Short Term Goal 1 - Progress (Week 1): Partly met (pt fluctuates, mod assist at times, +2 at times) OT Short Term Goal 2 (Week 1): Pt will don pants over feet with reacher with min A. OT Short Term Goal 2 - Progress (Week 1): Progressing toward goal OT Short Term Goal 3 (Week 1): Pt will bathe LB with long sponge with mod A. OT Short Term Goal 3 - Progress (Week 1): Progressing toward goal OT Short Term Goal 4 (Week 1): Pt will transfer to [facility] using scoot transfer with min A. OT Short Term Goal 4 - Progress (Week  1): Not met   Week 2:  OT Short Term Goal 1 (Week 2): Pt will sit to stand with min assist with RW to prepare for LB dressing  OT Short Term Goal 2 (Week 2): Pt will don pants over feet with reacher with min assist  OT Short Term Goal 3 (Week 2): Pt will bathe LB with LH sponge with mod assist  OT Short Term Goal 4 (Week 2): Pt will transfer to [facility] using scoot transfer with min assist   Skilled Therapeutic Interventions/Progress Updates:  Balance/vestibular training;Cognitive remediation/compensation;Discharge planning;DME/adaptive equipment instruction;Neuromuscular re-education;Pain management;Psychosocial support;Patient/family education;Self Care/advanced ADL retraining;Therapeutic Activities;Therapeutic Exercise;UE/LE Strength taining/ROM;UE/LE Coordination activities;Functional mobility training   SESSION NOTE  OT Individual Time: 3976-7341 OT Individual Time Calculation (min): 60 min  Minimal signs of pain when patient would bend back, encouraged no bending, no arching, no twisting Pt found seated in w/c at nurses station with wife present. Pt with increased agitation that started to escalate last night. Discussed how routine may help with patient's agitation and wife stated she wanted pt to engage in therapy although he didn't sleep well last night. Therapist propelled patient from nurses station to room. In w/c at sink, pt performed UB/LB bathing & dressing and grooming tasks. For tasks, pt took increased time and required max cueing to participate, for initiation and problem solving. Tried incorporating AE to increase independence, but pt would just shake head and expect/want therapist or wife to complete tasks for him. Due to decreased cognition, difficult to reason with patient and  encourage him to complete tasks. Pt did perform sit to/from stand X2 for therapist to doff old brief, clean peri area, and donn new brief + pants. At end of session, left pt seated in w/c with quick release belt  donned, wife present, and RN present.  Therapy Documentation Precautions:  Precautions Precautions: Fall, Back Precaution Comments: unable to recall 0/3 back precautions.  Required Braces or Orthoses: Spinal Brace Spinal Brace: Lumbar corset, Applied in sitting position (Pt only able to provide min assist for donning brace.) Restrictions Weight Bearing Restrictions: No Other Position/Activity Restrictions: CAM boot for RLE due to ankle fx, shoe for left foot  Vital Signs: Therapy Vitals Temp: 98.3 F (36.8 C) Temp Source: Oral Pulse Rate: 71 Resp: 18 BP: 128/80 mmHg Patient Position (if appropriate): Lying Oxygen Therapy SpO2: 98 % O2 Device: Not Delivered  See Function Navigator for Current Functional Status.  Therapy/Group: Individual Therapy  Chrys Racer , MS, OTR/L, CLT  11/07/2015, 9:20 AM

## 2015-11-07 NOTE — Progress Notes (Signed)
Physical Therapy Session Note  Patient Details  Name: Mitchell [name] MRN: [id number] Date of Birth: [birth year]  Today's Date: 11/07/2015 PT Individual Time: 0907-1002 AND 5397-6734 PT Individual Time Calculation (min): 55 min AND 58mn   Short Term Goals: Week 1:  PT Short Term Goal 1 (Week 1): Patient will consistent perform stand pivot transfer and sit<>stand with Mod A and RW.  PT Short Term Goal 1 - Progress (Week 1): Partly met PT Short Term Goal 2 (Week 1): Patient will performed bed mobility with min A.  PT Short Term Goal 2 - Progress (Week 1): Progressing toward goal PT Short Term Goal 3 (Week 1): Patient will  ambulate 275fwith Mod A from PT with RW  PT Short Term Goal 3 - Progress (Week 1): Met PT Short Term Goal 4 (Week 1): Patient will perform car transfer with max A and RW.  PT Short Term Goal 4 - Progress (Week 1): Met Week 2:  PT Short Term Goal 1 (Week 2): Patient will perform sit<>stand and stand pivot with min A and max cues with RW.  PT Short Term Goal 2 (Week 2): Patient will ambulate 5034fith min A and RW.  PT Short Term Goal 3 (Week 2): Patient will perform bed mobility with min A and use of bed features.  PT Short Term Goal 4 (Week 2): Patient will perform WC mobility consistently with supervision Afor >150f34fT Short Term Goal 5 (Week 2): Patient will initiate stair training.   Skilled Therapeutic Interventions/Progress Updates:    [name].  PT instructed [facility] in [facility] for 135ft49fh supervision A and minA x 2 to prevent veer into obstacles into hall. Patient required multiple redirections to stay on task and increase particiation with increased mobility.    Gait trianing for 15ft 64fwith mod A from PT. PT required to provide max cues for sequencing and AD management. Patient demonstrated decreased stability and step length with BLE.   Sit<>stand with mod- max A from PT. Patient required max cues for  anterior weight shift and push with UE from arm rests.    Nustep level 4 x 5minut69m patient stopped activity duwe to increased confusion.  Seated therex with level 2 tband including HS curl x12, LAQ x 12, hip abduction x 12  Mod cues from PT for improved ROM and control with eccentric movement.   [facility] in [facility] bell in reach and wife present.   Session 2.   Patient received Supine in bed. Bed mobility for supine to site through log roll with mod A from PT and mod cues for improved LE positioning and to minimze trunk rotation.   Sit<>stand training with RW mod A x4 and min A x 1. With heavy cues for improved anterior weight shift and increased use of UE for push from arm rests.   Step training in parallel bars  for toe taps on 3 inch step x 1 BLE . Patient demonstrated difficulty with downward movement of LLE due to R hip instability.  Step training in parallel bars on 2 inch step with alternating toe taps x6 and x8. PT provided mod tactile cues for improved glute med activation and decreased trendelenburg step pattern. Patient ascended 1, 2 inch step with LLE in parallel bars x 2.   Patient able to recall events of current therapy session and able to tell PA what PT roll was in healing  process  Patient performed WC mobility for 50t with supervision A and min A through doorway and with proper turning technique.   PT left patient in [facility] with lap belt in place and call bell in reach.   Therapy Documentation Precautions:  Precautions Precautions: Fall, Back Precaution Comments: unable to recall 0/3 back precautions.  Required Braces or Orthoses: Spinal Brace Spinal Brace: Lumbar corset, Applied in sitting position (Pt only able to provide min assist for donning brace.) Restrictions Weight Bearing Restrictions: No Other Position/Activity Restrictions: CAM boot for RLE due to ankle fx, shoe for left foot    Pain: 4/10     See Function Navigator for Current Functional  Status.   Therapy/Group: Individual Therapy  [name] 11/07/2015, 4:14 PM

## 2015-11-07 NOTE — Patient Care Conference (Signed)
Inpatient RehabilitationTeam Conference and Plan of Care Update Date: 11/05/2015   Time: 2:05 PM    Patient Name: Mitchell [name]      Medical Record Number: [id number]  Date of Birth: Jan 17, 1935 Sex: Male         Room/Bed: 4M11C/4M11C-01 Payor Info: Payor: MEDICARE / Plan: MEDICARE PART A AND B / Product Type: *No Product type* /    Admitting Diagnosis: Lumbar Fusion  Admit Date/Time:  10/29/2015  6:03 PM Admission Comments: No comment available   Primary Diagnosis:  Intervertebral disc disorder with radiculopathy of lumbar region Principal Problem: Intervertebral disc disorder with radiculopathy of lumbar region  Patient Active Problem List   Diagnosis Date Noted  . Agitation   . Surgical wound infection   . Debility   . Postoperative confusion   . Surgical wound dehiscence   . Hyponatremia   . Leukocytosis   . Acute blood loss anemia   . Abnormality of gait   . Intervertebral disc disorder with radiculopathy of lumbar region 10/29/2015  . Benign essential HTN   . Adjustment disorder with mixed anxiety and depressed mood   . Gastroesophageal reflux disease without esophagitis   . Asthma   . Chronic back pain   . Surgery, other elective   . Coronary artery disease involving native coronary artery of native heart without angina pectoris   . Atrial flutter ([place])   . Obesity   . Ureteral stricture   . Right foot drop   . Ankle fracture, right   . Osteoarthritis of spine with radiculopathy, lumbosacral region   . Rotator cuff tear   . Primary osteoarthritis of right shoulder   . Post-operative pain   . Cognitive deficits   . Lumbar stenosis with neurogenic claudication 10/24/2015  . Status post laminectomy with spinal fusion 10/24/2015  . Atrial flutter with RVR- s/p TEE CV Feb 2013 05/25/2011  . Anticoagulant causing adverse effect in therapeutic use 05/25/2011  . LV dysfunction, EF 45% in AF- >55% May 2013 in NSR 05/25/2011  . CHF, acute, mild secondary to EF 40-45% an  a. flutter with RVR 05/25/2011  . Erectile dysfunction 05/16/2011  . Low testosterone 05/16/2011  . BPH (benign prostatic hyperplasia) 05/16/2011  . History of recurrent urinary tract infection 05/16/2011  . Spinal stenosis 05/16/2011  . HTN (hypertension), 09/17/2010  . Hyperlipidemia 09/17/2010  . CAD- BMS to PLA 2001, 70% mid LAD, 50-60% prox. LCX, 60% PDA- Low risk Myoview Jan 2013 09/17/2010  . Morbid obesity ([place]) 09/17/2010  . Sleep apnea, intolerant to cpap 09/17/2010  . Pituitary microadenoma with hyperprolactinemia ([place]) 09/17/2010  . Asthma 09/17/2010  . Osteoarthritis 09/17/2010  . History of melanoma in situ 09/17/2010  . Hx of adenomatous colonic polyps 09/17/2010    Expected Discharge Date: Expected Discharge Date: 11/19/15  Team Members Present: Physician leading conference: Dr. Delice Lesch Social Worker Present: Lennart Pall, LCSW Nurse Present: Heather Roberts, RN PT Present: Lavone Nian, Rory Percy, PT OT Present: [name], OT SLP Present: Gunnar Fusi, SLP PPS Coordinator present : Daiva Nakayama, RN, CRRN     Current Status/Progress Goal Weekly Team Focus  Medical   Gait and cognitive deficits with weakness secondary to lumbar radiculopathy L2/3 and kyphosis with L4/5 spondylolisthesis with hx of right foot drop  Improve mobility, transfers, cognition, pain  See above   Bowel/Bladder   continent of bowel & bladder (urinal & briefs @ night), lbm 7/18  remain continent, regular bm  monitor   Swallow/Nutrition/ Hydration  ADL's   max A with LB self care, min - mod to stand, mod A with transfers  min A with LB self care, supervision with ADL transfers  ADL retraining with AE, pt/family education, functional mobility, general strengthening   Mobility   ambulates a maximum of 20 ft with RW & +2 for safety; pt very impulsive & confused; requires mod A for sit<>stand transfers, very poor endurance  min A for car transfers, supervision for  ambulation with LRAD, mod I for sitting balance, supervision for standing balance, min A for stair negotiation  pt education/safety awareness, gait training, strengthening, endurance trianing   Communication             Safety/Cognition/ Behavioral Observations            Pain   vicodin & tramadol prn  <3  assess & medicate prn   Skin   lumbar incision draining mod-lg serosanguinous drainage  no skin breakdown, incision will heal well  assess qshift & prn, dressing changes as needed    Rehab Goals Patient on target to meet rehab goals: Yes *See Care Plan and progress notes for long and short-term goals.  Barriers to Discharge: Surgical wound dehiscence, wound infection, pain, ABLA, confusion    Possible Resolutions to Barriers:  IV Abx, Neurosurg and ID consult, monitor labs, optimize pain meds for confusion    Discharge Planning/Teaching Needs:  Home with wife who can provide 24/7 supervision only.  Teaching is ongoing.   Team Discussion:  Wound dehisced earlier this week;  Await ID consult;  Started IV abx.  Some confusion - most likely r/t to infection combine with ?some premorbid issues.  Very confused last night.  Team questioning how much longer CAM boot is needed - MD to follow up.  PT/OT report pt can be very impulsive and progress is slow.  Goals overall at min assist and need to insure wife can provide this.  Revisions to Treatment Plan:  None   Continued Need for Acute Rehabilitation Level of Care: The patient requires daily medical management by a physician with specialized training in physical medicine and rehabilitation for the following conditions: Daily direction of a multidisciplinary physical rehabilitation program to ensure safe treatment while eliciting the highest outcome that is of practical value to the patient.: Yes Daily medical management of patient stability for increased activity during participation in an intensive rehabilitation regime.: Yes Daily  analysis of laboratory values and/or radiology reports with any subsequent need for medication adjustment of medical intervention for : Post surgical problems;Neurological problems;Wound care problems;Mood/behavior problems  Jessah Danser 11/07/2015, 6:41 AM

## 2015-11-07 NOTE — Progress Notes (Signed)
[place] PHYSICAL MEDICINE & REHABILITATION     PROGRESS NOTE  Subjective/Complaints:  Pt seen with daughter and wife.  Family concerned due to patient's agitation and behavior.     ROS: Denies CP, SOB, N/V/D.  Objective: Vital Signs: Blood pressure 128/80, pulse 71, temperature 98.3 F (36.8 C), temperature source Oral, resp. rate 18, height 6\' 3"  (1.905 m), weight 127.824 kg (281 lb 12.8 oz), SpO2 98 %. Ct Head Wo Contrast  11/06/2015  CLINICAL DATA:  Lumbar surgery yesterday/ sudden onset memory loss EXAM: CT HEAD WITHOUT CONTRAST TECHNIQUE: Contiguous axial images were obtained from the base of the skull through the vertex without intravenous contrast. COMPARISON:  05/09/2013 FINDINGS: Periventricular white matter changes are consistent with small vessel disease. There is mild central cortical atrophy. There is no intra or extra-axial fluid collection or mass lesion. The basilar cisterns and ventricles have a normal appearance. There is no CT evidence for acute infarction or hemorrhage. Bone windows are unremarkable. IMPRESSION: 1. The atrophy and small vessel disease. 2.  No evidence for acute intracranial abnormality. Electronically Signed   By: Nolon Nations M.D.   On: 11/06/2015 16:26   No results for input(s): WBC, HGB, HCT, PLT in the last 72 hours.  Recent Labs  11/05/15 0624  CREATININE 0.57*   CBG (last 3)   Recent Labs  11/04/15 1104  GLUCAP 159*    Wt Readings from Last 3 Encounters:  11/06/15 127.824 kg (281 lb 12.8 oz)  10/24/15 122.471 kg (270 lb)  10/16/15 122.471 kg (270 lb)    Physical Exam:  BP 128/80 mmHg  Pulse 71  Temp(Src) 98.3 F (36.8 C) (Oral)  Resp 18  Ht 6\' 3"  (1.905 m)  Wt 127.824 kg (281 lb 12.8 oz)  BMI 35.22 kg/m2  SpO2 98% Constitutional: He appears well-developed and well-nourished.  HENT: Normocephalic and atraumatic.  Eyes: Conjunctivae are normal. Cardiovascular: Normal rate and regular rhythm.  Respiratory: Effort  normal and breath sounds normal. No stridor. No respiratory distress. He has no wheezes.  GI: Soft. Bowel sounds are normal. He exhibits no distension. There is no tenderness.  Musculoskeletal: He exhibits edema (1+ edema right ankle). He exhibits no tenderness.  Neurological: He is alert and oriented x1. HOH Motor: B/l UE: 4+/5 grossly proximal to distal RLE: hip flexion, knee extension 4+/5, ankle dorsi/plantarflexion 4-/5 LLE: hip flexion, knee extension 4+/5, ankle dorsi/plantar flexion 4+/5  Skin: Skin is warm and dry. No erythema. Back incision with serosanguinous drainage from proximal small opening (not examined today).  Psychiatric: He has a normal mood and affect. Thought content normal. Confused   Assessment/Plan: 1. Functional deficits secondary to lumbar radiculopathy L2/3 and kyphosis with L4/5 spondylolisthesis with hx of right foot drop which require 3+ hours per day of interdisciplinary therapy in a comprehensive inpatient rehab setting. Physiatrist is providing close team supervision and 24 hour management of active medical problems listed below. Physiatrist and rehab team continue to assess barriers to discharge/monitor patient progress toward functional and medical goals.  Function:  Bathing Bathing position   Position: Sitting EOB  Bathing parts Body parts bathed by patient: Right arm, Left arm, Chest, Abdomen, Front perineal area, Right upper leg, Left upper leg, Right lower leg, Left lower leg (long sponge) Body parts bathed by helper: Back, Buttocks  Bathing assist Assist Level:  (patient completed 40%, max assist)      Upper Body Dressing/Undressing Upper body dressing   What is the patient wearing?: Pull over shirt/dress, Orthosis  Pull over shirt/dress - Perfomed by patient: Thread/unthread right sleeve, Thread/unthread left sleeve, Put head through opening Pull over shirt/dress - Perfomed by helper: Pull shirt over trunk Button up shirt - Perfomed by  patient: Thread/unthread right sleeve, Thread/unthread left sleeve, Button/unbutton shirt Button up shirt - Perfomed by helper: Pull shirt around back Orthosis activity level: Performed by helper  Upper body assist Assist Level:  (75%, min assist)      Lower Body Dressing/Undressing Lower body dressing   What is the patient wearing?: Pants, Underwear, [employer], Shoes Underwear - Performed by patient: Thread/unthread left underwear leg Underwear - Performed by helper: Thread/unthread right underwear leg, Pull underwear up/down Pants- Performed by patient: Thread/unthread right pants leg, Thread/unthread left pants leg Pants- Performed by helper: Pull pants up/down           Shoes - Performed by helper: Don/doff right shoe, Don/doff left shoe, Fasten right, Fasten left       TED Hose - Performed by helper: Don/doff right TED hose, Don/doff left TED hose  Lower body assist Assist for lower body dressing: Touching or steadying assistance (Pt > 75%)      Toileting Toileting Toileting activity did not occur: Safety/medical concerns   Toileting steps completed by helper: Adjust clothing prior to toileting, Performs perineal hygiene, Adjust clothing after toileting    Toileting assist Assist level: Two helpers   Transfers Chair/bed transfer   Chair/bed transfer method: Stand pivot Chair/bed transfer assist level: Moderate assist (Pt 50 - 74%/lift or lower) Chair/bed transfer assistive device: Armrests, Medical sales representative     Max distance: 34ft Assist level: Moderate assist (Pt 50 - 74%)   Wheelchair   Type: Manual Max wheelchair distance: 139ft Assist Level: Supervision or verbal cues  Cognition Comprehension Comprehension assist level: Understands basic 75 - 89% of the time/ requires cueing 10 - 24% of the time  Expression Expression assist level: Expresses basic 75 - 89% of the time/requires cueing 10 - 24% of the time. Needs helper to occlude trach/needs  to repeat words.  Social Interaction Social Interaction assist level: Interacts appropriately 90% of the time - Needs monitoring or encouragement for participation or interaction.  Problem Solving Problem solving assist level: Solves basic 50 - 74% of the time/requires cueing 25 - 49% of the time  Memory Memory assist level: Recognizes or recalls less than 25% of the time/requires cueing greater than 75% of the time     Medical Problem List and Plan: 1. Gait and cognitive deficits with weakness secondary to lumbar radiculopathy L2/3 and kyphosis with L4/5 spondylolisthesis with hx of right foot drop  Cont CIR 2. DVT Prophylaxis/Anticoagulation: Pharmaceutical: Lovenox added as 5 days post op.  3. Pain Management:  Scheduled ultram qid for now, d/ced on 7/19.  Will continue oxycodone prn for now.  4. Mood: LCSW to follow for evaluation and support.  5. Neuropsych: This patient is not capable of making decisions on his own behalf.  Cont Klonipin   Added seroquel 12.5 qhs for agitation, restlessness on 7/21 due to medication sensitivity, may need to titrate to 25mg   CT head from 7/20 reviewed, no acute changes, small vessel disease  Ammonia pending 6. Skin/Wound Care: Monitor wound for healing. Maintain adequate nutrition and hydration status.   Surgical wound infection. Appreciate Neurosurg and ID recs.    IV Abx, now on PO Levoquin.    Will follow up regarding dressing changes and need for wound dressing 7. Fluids/Electrolytes/Nutrition: Monitor  I/O.   Hyponatremia: 135 on 7/18  Labs pending 8. HTN: Monitor BP bid. On amlodipine and atenolol.  9. Ureteral stricture: Currently voiding without difficulty Monitor I/O.  10. Ankle fracture: To wear CAM boot when standing/walking. 11. Depression/Anxiety: On cymbalta 12. GERD: On protonix.  13. Asthma: Encourage IS. On dulera for hyperactive airway.  14. Constipation: Increased miralax to bid. 15. CAD:  Cont meds 16. Obesity: Body mass index is 33.75 kg/(m^2) on admission, diet and exercise education, encourage weight loss to increase endurance and promote overall health 17. Right ankle fracture: Cont boot 18. Chronic back pain: With severe central canal stenosis and foraminal narrowing: Cont meds 19. Right RTC tear and DJD right shoulder: Cont meds 20. Cognitive deficits with difficulty processing: Likely secondary to narcotics +/- infection. 21. Leukocytosis  Wound cx with Pseudomonas and Klebseilla.  WBCs 13.1  on 7/18 (improved with abx)  UA neg, Ucx multiple species  Will cont to monitor 22. ABLA  Hb 9.2 on 7/18  Cont to monitor  Labs pending 13. Confusion  Likely secondary to infection +/- medications  Will cont to monitor, medications also reviewed, pain medications adjusted, started on seroquel qHS.  Greater than 35 min spent with pt with greater than 30 minutes in counseling.  LOS (Days) 9 A FACE TO FACE EVALUATION WAS PERFORMED  Ankit [name] 11/07/2015 9:07 AM

## 2015-11-07 NOTE — Progress Notes (Signed)
Spoke with patient daughter and informed that restraints had to be used to keep him safe. She veralized she understood and was thankful of the help we gave her father she stated that she would talk with her mother and wait for the MD this am. Arthor Captain LPN

## 2015-11-07 NOTE — Progress Notes (Signed)
Daughter stated that pt is becoming more agitated and confused PA on call notified. New Orders received will continue to monitor. Arthor Captain LPN

## 2015-11-07 NOTE — Progress Notes (Signed)
Physical Therapy Weekly Progress Note  Patient Details  Name: Mitchell [name] MRN: [id number] Date of Birth: 04/24/1934  Beginning of progress report period: October 30, 2015 End of progress report period: November 06, 2015  Today's Date: 11/06/2015       Patient has met 2 of 4 short term goals.  Patient demonstrates continued increased confusion preventing consistent safe transfer and bed mobility. Strength has improved in bilateral LE as well as some safety behaviors with transfers as patient continues to progress toward LTG.   Patient continues to demonstrate the following deficits: Strength, awareness, balance, mentation, endurance and therefore will continue to benefit from skilled PT intervention to enhance overall performance with activity tolerance, balance, postural control, awareness, coordination and knowledge of precautions.  Patient progressing toward long term goals..  Continue plan of care.  PT Short Term Goals Week 1:  PT Short Term Goal 1 (Week 1): Patient will consistent perform stand pivot transfer and sit<>stand with Mod A and RW.  PT Short Term Goal 1 - Progress (Week 1): Partly met PT Short Term Goal 2 (Week 1): Patient will performed bed mobility with min A.  PT Short Term Goal 2 - Progress (Week 1): Progressing toward goal PT Short Term Goal 3 (Week 1): Patient will  ambulate 70f with Mod A from PT with RW  PT Short Term Goal 3 - Progress (Week 1): Met PT Short Term Goal 4 (Week 1): Patient will perform car transfer with max A and RW.  PT Short Term Goal 4 - Progress (Week 1): Met Week 2:  PT Short Term Goal 1 (Week 2): Patient will perform sit<>stand and stand pivot with min A and max cues with RW.  PT Short Term Goal 2 (Week 2): Patient will ambulate 583fwith min A and RW.  PT Short Term Goal 3 (Week 2): Patient will perform bed mobility with min A and use of bed features.  PT Short Term Goal 4 (Week 2): Patient will perform WC mobility consistently with supervision  Afor >15079fPT Short Term Goal 5 (Week 2): Patient will initiate stair training.     Therapy Documentation Precautions:  Precautions Precautions: Fall, Back Precaution Comments: unable to recall 0/3 back precautions.  Required Braces or Orthoses: Spinal Brace Spinal Brace: Lumbar corset, Applied in sitting position (Pt only able to provide min assist for donning brace.) Restrictions Weight Bearing Restrictions: No Other Position/Activity Restrictions: CAM boot for RLE due to ankle fx, shoe for left foot General:   Vital Signs: Therapy Vitals Temp: 98.3 F (36.8 C) Temp Source: Oral Pulse Rate: 71 Resp: 18 BP: 128/80 mmHg Patient Position (if appropriate): Lying Oxygen Therapy SpO2: 98 % O2 Device: Not Delivered  See Function Navigator for Current Functional Status.   AusLorie Phenix21/2017, 7:43 AM

## 2015-11-07 NOTE — Progress Notes (Signed)
Social Work Patient ID: Mitchell [name], male   DOB: [birth year], 81 y.o.   MRN: [id number]  Met with pt and wife Wed afternoon to review team conference.  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  Stressed to both (but mostly to wife) that goals have been set for minimal assist and what that means given his size.  Wife quickly states no concerns about providing this level of care and says "I was doing it at home before."  They are aware and agreeable with targeted d/c date of 8/2.  [name], LCSW

## 2015-11-07 NOTE — Progress Notes (Signed)
Physical Therapy Session Note  Patient Details  Name: Mitchell [name] MRN: [id number] Date of Birth: [birth year]  Today's Date: 11/07/2015 PT Individual Time: 1115-1200 PT Individual Time Calculation (min): 45 min   Short Term Goals: Week 1:  PT Short Term Goal 1 (Week 1): Patient will consistent perform stand pivot transfer and sit<>stand with Mod A and RW.  PT Short Term Goal 1 - Progress (Week 1): Partly met PT Short Term Goal 2 (Week 1): Patient will performed bed mobility with min A.  PT Short Term Goal 2 - Progress (Week 1): Progressing toward goal PT Short Term Goal 3 (Week 1): Patient will  ambulate 72f with Mod A from PT with RW  PT Short Term Goal 3 - Progress (Week 1): Met PT Short Term Goal 4 (Week 1): Patient will perform car transfer with max A and RW.  PT Short Term Goal 4 - Progress (Week 1): Met  Skilled Therapeutic Interventions/Progress Updates:    Pt received resting in w/c with no c/o pain and agreeable to therapy session.  Session focus on activity tolerance, transfers, gait training, strengthening, and sequencing.  Pt transfers sit<>stand from w/c with mod assist consistently throughout session.  Pt propels w/c with UEs x100' for overall endurance.  Stand pivot and ambulatory transfer with RW and mod assist with mod/max verbal cues for sequencing.  As pt fatigues he requires increasing cues.  Gait training x30' +40' +15' with RW and min/mod assist with encouragement to increase distance.  PT instructed pt in repeated sit<>stand for strengthening x3 reps.  Pt performed LAQ with 3 second hold x8 reps bilaterally for LE strengthening.  Pt returned to room in w/c at end of session total assist for energy conservation, left upright in w/c with wife present in room, call bell in reach and needs met.   Therapy Documentation Precautions:  Precautions Precautions: Fall, Back Precaution Comments: unable to recall 0/3 back precautions.  Required Braces or Orthoses: Spinal  Brace Spinal Brace: Lumbar corset, Applied in sitting position (Pt only able to provide min assist for donning brace.) Restrictions Weight Bearing Restrictions: No Other Position/Activity Restrictions: CAM boot for RLE due to ankle fx, shoe for left foot   See Function Navigator for Current Functional Status.   Therapy/Group: Individual Therapy  CEarnest ConroyPenven-Crew 11/07/2015, 12:38 PM

## 2015-11-07 NOTE — Progress Notes (Signed)
Pt is very confused agitated trying to get out of bed not able to redirect patient. PA on call notified for waist restraint. Arthor Captain LPN

## 2015-11-07 NOTE — Progress Notes (Signed)
Pt interacted appropriately with staff. No attempts to get out of bed this shift or any unsafe behaviors noted. Rested between therapy sessions. Restraints orders still in effect for safety. Staff continuing to monitor.

## 2015-11-07 NOTE — Progress Notes (Signed)
Drsg changed per order. Old dressing, with 95% saturation of serosanguinous drainage. Visible slough to proximal, mid,  and distal portions of incision line. Distal end also noted to be dehisced. Continuous, leakage from proximal area during dressing change.

## 2015-11-07 NOTE — Progress Notes (Signed)
Patient ID: AKRAM MOBBS, male   DOB: 1934-06-17, 81 y.o.   MRN: [id number]  Pt sitting up in wheelchair, agreeable & conversant, but quite confused. 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  Wife present reporting poor sleep last few nights.   Appreciate ID's evaluation and recommendations. From NS standpoint, continue Rehab. Drsg changes as needed to keep site dry and clean. Ok to cleanse site with baths and cover with dsd.   Verdis Prime RN BSN

## 2015-11-08 ENCOUNTER — Inpatient Hospital Stay (HOSPITAL_COMMUNITY): Payer: Medicare Other | Admitting: Physical Therapy

## 2015-11-08 NOTE — Progress Notes (Signed)
Physical Therapy Note  Patient Details  Name: Mitchell [name] MRN: [id number] Date of Birth: 12/24/1934 Today's Date: 11/08/2015  Patient asleep upon arrival and did not arouse to verbal cues. Patient's family at bedside and requesting this therapist to allow patient to sleep at this time and reported patient would participate in next therapy session. Patient missed 30 min skilled physical therapy, will f/u as able.    Carney Living A 11/08/2015, 3:09 PM

## 2015-11-08 NOTE — Progress Notes (Signed)
Physical Therapy Session Note  Patient Details  Name: Mitchell [name] MRN: [id number] Date of Birth: [birth year]  Today's Date: 11/08/2015 PT Individual Time: 1012-1113 PT Individual Time Calculation (min): 61 min   Short Term Goals: Week 2:  PT Short Term Goal 1 (Week 2): Patient will perform sit<>stand and stand pivot with min A and max cues with RW.  PT Short Term Goal 2 (Week 2): Patient will ambulate 38ft with min A and RW.  PT Short Term Goal 3 (Week 2): Patient will perform bed mobility with min A and use of bed features.  PT Short Term Goal 4 (Week 2): Patient will perform WC mobility consistently with supervision Afor >145ft  PT Short Term Goal 5 (Week 2): Patient will initiate stair training.   Skilled Therapeutic Interventions/Progress Updates:     Pt received seated in w/c wearing lumbar orthosis and R cam walker; wife present. Pt expressing need to urinate; sit <> stand from w/c with mod A; static standing balance with BUE support at RW x60 sec with min A, wife managing clothing/urinal. Upon returning to seated, pt expressing need to have BM; therefore, performed gait 2 x10' into/out of bathroom with RW, mod A over level surface, max A over bathroom threshold.   W/c mobility x100' with BUE's, cueing for increased efficiency of propulsion with effective return demo. Performed gait x20' with RW, min to mod A for stability/balance, cueing for RLE placement due to excessive R hip ER, ADD (suspect due to RLE CAM walker). Gait distance limited by pt fatigue. In therapy gym, performed blocked practice of graded sit <> stand with RW from progressively lower mat table; cueing provided for setup, hand placement, and full anterior weight shift with inconsistent carryover. Session ended in pt room, where pt was left seated in w/c with QR release belt in place for safety, all needs within reach.    Therapy Documentation Precautions:  Precautions Precautions: Back Precaution Comments: unable  to recall 0/3 back precautions.  Required Braces or Orthoses: Spinal Brace Spinal Brace: Applied in sitting position Restrictions Weight Bearing Restrictions: No Other Position/Activity Restrictions: CAM boot for RLE due to ankle fx, shoe for left foot  Pain: Pain Assessment Pain Assessment: 0-10 Pain Score: 3  Pain Type: Chronic pain Pain Location: Back Pain Orientation: Mid;Lower Pain Descriptors / Indicators: Aching Pain Frequency: Intermittent Pain Onset: With Activity Patients Stated Pain Goal: 3 Pain Intervention(s): Medication (See eMAR);Repositioned Multiple Pain Sites: No    See Function Navigator for Current Functional Status.   Therapy/Group: Individual Therapy  Ajene Carchi, Malva Cogan 11/08/2015, 1:22 PM

## 2015-11-08 NOTE — Progress Notes (Signed)
[facility] PHYSICAL MEDICINE & REHABILITATION     PROGRESS NOTE  Subjective/Complaints:  Wife pleased that pt slept well  ROS: Denies CP, SOB, N/V/D.  Objective: Vital Signs: Blood pressure 163/73, pulse 72, temperature 98.2 F (36.8 C), temperature source Axillary, resp. rate 18, height 6\' 3"  (1.905 m), weight 127.824 kg (281 lb 12.8 oz), SpO2 97 %. Ct Head Wo Contrast  11/06/2015  CLINICAL DATA:  Lumbar surgery yesterday/ sudden onset memory loss EXAM: CT HEAD WITHOUT CONTRAST TECHNIQUE: Contiguous axial images were obtained from the base of the skull through the vertex without intravenous contrast. COMPARISON:  05/09/2013 FINDINGS: Periventricular white matter changes are consistent with small vessel disease. There is mild central cortical atrophy. There is no intra or extra-axial fluid collection or mass lesion. The basilar cisterns and ventricles have a normal appearance. There is no CT evidence for acute infarction or hemorrhage. Bone windows are unremarkable. IMPRESSION: 1. The atrophy and small vessel disease. 2.  No evidence for acute intracranial abnormality. Electronically Signed   By: Nolon Nations M.D.   On: 11/06/2015 16:26    Recent Labs  11/07/15 1047  WBC 14.3*  HGB 10.4*  HCT 33.7*  PLT 567*    Recent Labs  11/07/15 1047  NA 132*  K 4.4  CL 97*  GLUCOSE 134*  BUN 6  CREATININE 0.68  CALCIUM 8.8*   CBG (last 3)  No results for input(s): GLUCAP in the last 72 hours.  Wt Readings from Last 3 Encounters:  11/06/15 127.824 kg (281 lb 12.8 oz)  10/24/15 122.471 kg (270 lb)  10/16/15 122.471 kg (270 lb)    Physical Exam:  BP 163/73 mmHg  Pulse 72  Temp(Src) 98.2 F (36.8 C) (Axillary)  Resp 18  Ht 6\' 3"  (1.905 m)  Wt 127.824 kg (281 lb 12.8 oz)  BMI 35.22 kg/m2  SpO2 97% Constitutional: He appears well-developed and well-nourished.  HENT: Normocephalic and atraumatic.  Eyes: Conjunctivae are normal. Cardiovascular: Normal rate and regular  rhythm.  Respiratory: Effort normal and breath sounds normal. No stridor. No respiratory distress. He has no wheezes.  GI: Soft. Bowel sounds are normal. He exhibits no distension. There is no tenderness.  Musculoskeletal: He exhibits edema (1+ edema right ankle). He exhibits no tenderness.  Neurological: He is alert and oriented x1. HOH Motor: B/l UE: 4+/5 grossly proximal to distal RLE: hip flexion, knee extension 4+/5, ankle dorsi/plantarflexion 4-/5 LLE: hip flexion, knee extension 4+/5, ankle dorsi/plantar flexion 4+/5  Skin: Skin is warm and dry. No erythema. Back incision with serosanguinous drainage from proximal small opening (not examined today).  Psychiatric: He has a normal mood and affect. Thought content normal. Confused   Assessment/Plan: 1. Functional deficits secondary to lumbar radiculopathy L2/3 and kyphosis with L4/5 spondylolisthesis with hx of right foot drop which require 3+ hours per day of interdisciplinary therapy in a comprehensive inpatient rehab setting. Physiatrist is providing close team supervision and 24 hour management of active medical problems listed below. Physiatrist and rehab team continue to assess barriers to discharge/monitor patient progress toward functional and medical goals.  Function:  Bathing Bathing position   Position: Wheelchair/chair at sink  Bathing parts Body parts bathed by patient: Right arm, Left arm, Chest, Abdomen, Front perineal area, Right upper leg, Left upper leg, Right lower leg, Left lower leg Body parts bathed by helper: Back, Buttocks  Bathing assist Assist Level: Touching or steadying assistance(Pt > 75%), 2 helpers      Upper Body Dressing/Undressing Upper body  dressing   What is the patient wearing?: Pull over shirt/dress, Orthosis     Pull over shirt/dress - Perfomed by patient: Thread/unthread right sleeve, Thread/unthread left sleeve, Put head through opening Pull over shirt/dress - Perfomed by helper: Pull  shirt over trunk Button up shirt - Perfomed by patient: Thread/unthread right sleeve, Thread/unthread left sleeve, Button/unbutton shirt Button up shirt - Perfomed by helper: Pull shirt around back Orthosis activity level: Performed by helper  Upper body assist Assist Level: 2 helpers      Lower Body Dressing/Undressing Lower body dressing   What is the patient wearing?: Pants, Underwear, [employer], Shoes Underwear - Performed by patient: Thread/unthread left underwear leg Underwear - Performed by helper: Thread/unthread right underwear leg, Pull underwear up/down, Thread/unthread left underwear leg Pants- Performed by patient: Thread/unthread right pants leg, Thread/unthread left pants leg Pants- Performed by helper: Pull pants up/down, Thread/unthread right pants leg, Thread/unthread left pants leg           Shoes - Performed by helper: Don/doff right shoe, Don/doff left shoe, Fasten right, Fasten left       TED Hose - Performed by helper: Don/doff right TED hose, Don/doff left TED hose  Lower body assist Assist for lower body dressing: 2 Helpers      Toileting Toileting Toileting activity did not occur: N/A   Toileting steps completed by helper: Adjust clothing prior to toileting, Performs perineal hygiene, Adjust clothing after toileting    Toileting assist Assist level: Two helpers   Transfers Chair/bed transfer   Chair/bed transfer method: Stand pivot Chair/bed transfer assist level: Moderate assist (Pt 50 - 74%/lift or lower) Chair/bed transfer assistive device: Armrests, Medical sales representative     Max distance: 35ft Assist level: Moderate assist (Pt 50 - 74%)   Wheelchair   Type: Manual Max wheelchair distance: 168ft Assist Level: Touching or steadying assistance (Pt > 75%)  Cognition Comprehension Comprehension assist level: Understands basic 50 - 74% of the time/ requires cueing 25 - 49% of the time  Expression Expression assist level: Expresses  basic 75 - 89% of the time/requires cueing 10 - 24% of the time. Needs helper to occlude trach/needs to repeat words.  Social Interaction Social Interaction assist level: Interacts appropriately 50 - 74% of the time - May be physically or verbally inappropriate.  Problem Solving Problem solving assist level: Solves basic 25 - 49% of the time - needs direction more than half the time to initiate, plan or complete simple activities  Memory Memory assist level: Recognizes or recalls 25 - 49% of the time/requires cueing 50 - 75% of the time     Medical Problem List and Plan: 1. Gait and cognitive deficits with weakness secondary to lumbar radiculopathy L2/3 and kyphosis with L4/5 spondylolisthesis with hx of right foot drop  Cont CIR 2. DVT Prophylaxis/Anticoagulation: Pharmaceutical: Lovenox added as 5 days post op.  3. Pain Management:  Scheduled ultram qid for now, d/ced on 7/19.  also off oxycodone  ,. Using tylenol 4. Mood: LCSW to follow for evaluation and support.  5. Neuropsych: This patient is not capable of making decisions on his own behalf.  Cont Klonipin   Added seroquel 12.5 qhs for agitation, restlessness on 7/21 due to medication sensitivity, slept very well still somnolent this am  CT head from 7/20 reviewed, no acute changes, small vessel disease  Ammonia normal 6. Skin/Wound Care: Monitor wound for healing. Maintain adequate nutrition and hydration status.   Surgical wound  infection. Appreciate Neurosurg and ID recs.    IV Abx, now on PO Levoquin.    Will follow up regarding dressing changes and need for wound dressing- superficial dehiscence appreciate NS f/u yest 7. Fluids/Electrolytes/Nutrition: Monitor I/O.   Hyponatremia: 135 on 7/18  Labs pending 8. HTN: Monitor BP bid. On amlodipine and atenolol.  9. Ureteral stricture: Currently voiding without difficulty Monitor I/O.  10. Ankle fracture: To wear CAM boot when  standing/walking. 11. Depression/Anxiety: On cymbalta 12. GERD: On protonix.  13. Asthma: Encourage IS. On dulera for hyperactive airway.  14. Constipation: Increased miralax to bid. 15. CAD: Cont meds 16. Obesity: Body mass index is 33.75 kg/(m^2) on admission, diet and exercise education, encourage weight loss to increase endurance and promote overall health 17. Right ankle fracture: Cont boot 18. Chronic back pain: With severe central canal stenosis and foraminal narrowing: Cont meds 19. Right RTC tear and DJD right shoulder: Cont meds 20. Cognitive deficits with difficulty processing: Likely secondary to narcotics +/- infection. 21. Leukocytosis  Wound cx with Pseudomonas and Klebseilla.  WBCs 13.1  on 7/18 , 14.3 on 7/21 abx per ID  UA neg, Ucx multiple species  Will cont to monitor 22. ABLA  Hb 9.2 on 7/18, up to 10.4 on 7/21  Cont to monitor  Labs pending 13. Confusion  Likely secondary to infection +/- medications  Will cont to monitor, medications also reviewed, pain medications adjusted, started on seroquel qHS.  Greater than 35 min spent with pt with greater than 30 minutes in counseling.  LOS (Days) 10 A FACE TO FACE EVALUATION WAS PERFORMED  Charlett Blake 11/08/2015 7:16 AM

## 2015-11-08 NOTE — Progress Notes (Signed)
Physical Therapy Session Note  Patient Details  Name: Mitchell [name] MRN: [id number] Date of Birth: 1934/07/12  Today's Date: 11/08/2015 PT Individual Time: 0800-0900 AND 1530-1629 PT Individual Time Calculation (min): 60 min AND 59 min   Short Term Goals: Week 1:  PT Short Term Goal 1 (Week 1): Patient will consistent perform stand pivot transfer and sit<>stand with Mod A and RW.  PT Short Term Goal 1 - Progress (Week 1): Partly met PT Short Term Goal 2 (Week 1): Patient will performed bed mobility with min A.  PT Short Term Goal 2 - Progress (Week 1): Progressing toward goal PT Short Term Goal 3 (Week 1): Patient will  ambulate 5f with Mod A from PT with RW  PT Short Term Goal 3 - Progress (Week 1): Met PT Short Term Goal 4 (Week 1): Patient will perform car transfer with max A and RW.  PT Short Term Goal 4 - Progress (Week 1): Met Week 2:  PT Short Term Goal 1 (Week 2): Patient will perform sit<>stand and stand pivot with min A and max cues with RW.  PT Short Term Goal 2 (Week 2): Patient will ambulate 577fwith min A and RW.  PT Short Term Goal 3 (Week 2): Patient will perform bed mobility with min A and use of bed features.  PT Short Term Goal 4 (Week 2): Patient will perform WC mobility consistently with supervision Afor >15064fPT Short Term Goal 5 (Week 2): Patient will initiate stair training.   Skilled Therapeutic Interventions/Progress Updates:  Session 1   patient received supine in bed with trade off from RN.[email or web address] performed supine>sit through log roll with min-Mod A from PT with min cues to prevent breaking back precautions and improved use of bed features. PT applied back brace in sitting position with max A.   Sit<>stand with minA and RW on EOB from elevated height x 4 for lower body dressing. PT provided max A for donning underpants and shorts to prevent arching back.   WC mobility for 150f56fth supervision A. Patient noted to have improved technique with turns  using BUE.   Sit<>[facility] min A x 2. Mod cues for improved anterior weight shift and proper LE positioning prior to transfer Car transfer with mod A from PT and RW with assist for BLE due to weak hip flexors. Max cues for proper UE and pelvic placement to improve safety of transfer and prevent twisting motion in lumbar spine.   Gait training using RW for 35ft43f with min A from PT and Mod A with turn to sit on second bout . PT also noted trendelenburg gait on the LLE and provided min-mod tactile stimulation to improve glute med activation and improve pelvic stability.  Patient returned to room and [facility] call bell in reach.     Session 2:  Patient received supine in bed and agreeable to PT.  Supine>sit transfer with min-mod A from PT. PT provided mod cues for proper UE placement and increased use of R UE to push to sitting from R side lying. PT applied back brace in sitting with max A. Patient reports sudden decreased in back pain with application of brace.    WC mobility training instructed by PT for 150ft 48f supervision A with min cues for improved use of BUE to avoid obstacles in hall. Patient performed 2 L turns and 3 R turns with WC mobility to avoid obstacles.  PT instructed patient in Gait training with RW for 46f x 2. Mod A provided by PT for increased safety and improved pelvic stability with stance on the LLE. Patient reports increased fatigue and SOB following each bout of gait training requiring prolonged rest break.    Seated therex x 12 BLE for each exercise. hip flexion AROM, Knee flexion with level 2 tband, knee extension with level 2 tband, and  hip abduciton with level 2 tband. PT provdied min cues for improved technique including decreased speed of movement to improved strengthening aspect of movement.    Step training. Including alternating toe tap on 3 inch step with min A from PT x 6 BLE.  Step up/down 3inch step x 4 with min A from PT  leading with the LLE. PT provided min tactile stimulation for improved pelvic stability and multtimodal cues for increased step height and hip flexion to allow safe LE movement over step.   [facility]       Therapy Documentation Precautions:  Precautions Precautions: Back Precaution Comments: unable to recall 0/3 back precautions.  Required Braces or Orthoses: Spinal Brace Spinal Brace: Applied in sitting position Restrictions Weight Bearing Restrictions: No Other Position/Activity Restrictions: CAM boot for RLE due to ankle fx, shoe for left foot General:   Vital Signs: Therapy Vitals Temp: 98.2 F (36.8 C) Temp Source: Axillary Pulse Rate: 72 Resp: 18 BP: (!) 163/73 mmHg Patient Position (if appropriate): Lying Oxygen Therapy SpO2: 97 % O2 Device: Not Delivered Pain: Pain Assessment Pain Assessment: Faces Faces Pain Scale: Hurts even more Pain Location: Back Pain Orientation: Lower;Medial   See Function Navigator for Current Functional Status.   Therapy/Group: Individual Therapy  ALorie Phenix7/22/2017, 9:01 AM

## 2015-11-09 ENCOUNTER — Inpatient Hospital Stay (HOSPITAL_COMMUNITY): Payer: Medicare Other | Admitting: Physical Therapy

## 2015-11-09 MED ORDER — CLONAZEPAM 0.5 MG PO TABS
0.2500 mg | ORAL_TABLET | Freq: Two times a day (BID) | ORAL | Status: DC | PRN
  Filled 2015-11-09: qty 1

## 2015-11-09 NOTE — Progress Notes (Signed)
[facility] PHYSICAL MEDICINE & REHABILITATION     PROGRESS NOTE  Subjective/Complaints:  Patient without new issues today was very tired after therapy yesterday  ROS: Denies CP, SOB, N/V/D.  Objective: Vital Signs: Blood pressure 140/65, pulse 72, temperature 98.8 F (37.1 C), temperature source Oral, resp. rate 18, height 6\' 3"  (1.905 m), weight 127.8 kg (281 lb 12.8 oz), SpO2 97 %. No results found.  Recent Labs  11/07/15 1047  WBC 14.3*  HGB 10.4*  HCT 33.7*  PLT 567*    Recent Labs  11/07/15 1047  NA 132*  K 4.4  CL 97*  GLUCOSE 134*  BUN 6  CREATININE 0.68  CALCIUM 8.8*   CBG (last 3)  No results for input(s): GLUCAP in the last 72 hours.  Wt Readings from Last 3 Encounters:  11/06/15 127.8 kg (281 lb 12.8 oz)  10/24/15 122.5 kg (270 lb)  10/16/15 122.5 kg (270 lb)    Physical Exam:  BP 140/65   Pulse 72   Temp 98.8 F (37.1 C) (Oral)   Resp 18   Ht 6\' 3"  (1.905 m)   Wt 127.8 kg (281 lb 12.8 oz)   SpO2 97%   BMI 35.22 kg/m  Constitutional: He appears well-developed and well-nourished.  HENT: Normocephalic and atraumatic.  Eyes: Conjunctivae are normal. Cardiovascular: Normal rate and regular rhythm.  Respiratory: Effort normal and breath sounds normal. No stridor. No respiratory distress. He has no wheezes.  GI: Soft. Bowel sounds are normal. He exhibits no distension. There is no tenderness.  Musculoskeletal: He exhibits edema (1+ edema right ankle). He exhibits no tenderness.  Neurological: He is alert and oriented x1. HOH Motor: B/l UE: 4+/5 grossly proximal to distal RLE: hip flexion, knee extension 4+/5, ankle dorsi/plantarflexion 4-/5 LLE: hip flexion, knee extension 4+/5, ankle dorsi/plantar flexion 4+/5  Skin: Skin is warm and dry. No erythema. Back incision with serosanguinous drainage from proximal small opening (not examined today).  Psychiatric: He has a normal mood and affect. Thought content normal.    Assessment/Plan: 1.  Functional deficits secondary to lumbar radiculopathy L2/3 and kyphosis with L4/5 spondylolisthesis with hx of right foot drop which require 3+ hours per day of interdisciplinary therapy in a comprehensive inpatient rehab setting. Physiatrist is providing close team supervision and 24 hour management of active medical problems listed below. Physiatrist and rehab team continue to assess barriers to discharge/monitor patient progress toward functional and medical goals.  Function:  Bathing Bathing position   Position: Wheelchair/chair at sink  Bathing parts Body parts bathed by patient: Right arm, Left arm, Chest, Abdomen, Front perineal area, Right upper leg, Left upper leg, Right lower leg, Left lower leg Body parts bathed by helper: Back, Buttocks  Bathing assist Assist Level: Touching or steadying assistance(Pt > 75%), 2 helpers      Upper Body Dressing/Undressing Upper body dressing   What is the patient wearing?: Pull over shirt/dress, Orthosis     Pull over shirt/dress - Perfomed by patient: Thread/unthread right sleeve, Thread/unthread left sleeve, Put head through opening Pull over shirt/dress - Perfomed by helper: Pull shirt over trunk Button up shirt - Perfomed by patient: Thread/unthread right sleeve, Thread/unthread left sleeve, Button/unbutton shirt Button up shirt - Perfomed by helper: Pull shirt around back Orthosis activity level: Performed by helper  Upper body assist Assist Level: 2 helpers      Lower Body Dressing/Undressing Lower body dressing   What is the patient wearing?: Pants, Underwear, [employer], Shoes Underwear - Performed by  patient: Thread/unthread left underwear leg Underwear - Performed by helper: Thread/unthread right underwear leg, Pull underwear up/down, Thread/unthread left underwear leg Pants- Performed by patient: Thread/unthread right pants leg, Thread/unthread left pants leg Pants- Performed by helper: Pull pants up/down, Thread/unthread right  pants leg, Thread/unthread left pants leg           Shoes - Performed by helper: Don/doff right shoe, Don/doff left shoe, Fasten right, Fasten left       TED Hose - Performed by helper: Don/doff right TED hose, Don/doff left TED hose  Lower body assist Assist for lower body dressing: 2 Automotive engineer activity did not occur: N/A   Toileting steps completed by helper: Adjust clothing prior to toileting, Performs perineal hygiene, Adjust clothing after toileting Toileting Assistive Devices: Grab bar or rail  Toileting assist Assist level: Two helpers   Transfers Chair/bed transfer   Chair/bed transfer method: Stand pivot Chair/bed transfer assist level: Touching or steadying assistance (Pt > 75%) Chair/bed transfer assistive device: Armrests, Medical sales representative     Max distance: 38ft Assist level: Touching or steadying assistance (Pt > 75%)   Wheelchair   Type: Manual Max wheelchair distance: 144ft Assist Level: Supervision or verbal cues  Cognition Comprehension Comprehension assist level: Understands basic 75 - 89% of the time/ requires cueing 10 - 24% of the time  Expression Expression assist level: Expresses basic 75 - 89% of the time/requires cueing 10 - 24% of the time. Needs helper to occlude trach/needs to repeat words.  Social Interaction Social Interaction assist level: Interacts appropriately 75 - 89% of the time - Needs redirection for appropriate language or to initiate interaction.  Problem Solving Problem solving assist level: Solves basic 25 - 49% of the time - needs direction more than half the time to initiate, plan or complete simple activities  Memory Memory assist level: Recognizes or recalls 25 - 49% of the time/requires cueing 50 - 75% of the time     Medical Problem List and Plan: 1. Gait and cognitive deficits with weakness secondary to lumbar radiculopathy L2/3 and kyphosis with L4/5 spondylolisthesis  with hx of right foot drop  Cont CIR 2. DVT Prophylaxis/Anticoagulation: Pharmaceutical: Lovenox added as 5 days post op.  3. Pain Management:  Scheduled ultram qid for now, d/ced on 7/19.  off oxycodone  ,. Using tylenol 4. Mood: LCSW to follow for evaluation and support.  5. Neuropsych: This patient is not capable of making decisions on his own behalf.  prn Klonipin   Added seroquel 12.5 qhs for agitation, restlessness on 7/21 due to medication sensitivity, slept fair  CT head from 7/20 reviewed, no acute changes, small vessel disease  Ammonia normal 6. Skin/Wound Care: Monitor wound for healing. Maintain adequate nutrition and hydration status.   Surgical wound infection. Appreciate Neurosurg and ID recs.    IV Abx, now on PO Levoquin.    Will follow up regarding dressing changes and need for wound dressing- superficial dehiscence appreciate NS f/u 7. Fluids/Electrolytes/Nutrition: Monitor I/O.   Hyponatremia: 135 on 7/18  Labs pending 8. HTN: Monitor BP bid. On amlodipine and atenolol.  9. Ureteral stricture: Currently voiding without difficulty Monitor I/O.  10. Ankle fracture: To wear CAM boot when standing/walking. 11. Depression/Anxiety: On cymbalta 12. GERD: On protonix.  13. Asthma: Encourage IS. On dulera for hyperactive airway.  14. Constipation: Increased miralax to bid. 15. CAD: Cont meds 16. Obesity: Body mass index is 33.75  kg/(m^2) on admission, diet and exercise education, encourage weight loss to increase endurance and promote overall health 17. Right ankle fracture: Cont boot 18. Chronic back pain: With severe central canal stenosis and foraminal narrowing: Cont meds 19. Right RTC tear and DJD right shoulder: Cont meds 20. Cognitive deficits with difficulty processing: Likely secondary to narcotics +/- infection. 21. Leukocytosis  Wound cx with Pseudomonas and Klebseilla.on Levaquin  WBCs 13.1  on 7/18 , 14.3 on 7/21 abx per  ID  UA neg, Ucx multiple species  22. ABLA  Hb 9.2 on 7/18, up to 10.4 on 7/21  Cont to monitor  Labs pending 13. Confusion  Likely secondary to infection +/- medications, change klonopin to prn  Will cont to monitor, medications also reviewed, pain medications adjusted, started on seroquel qHS.    LOS (Days) 11 A FACE TO FACE EVALUATION WAS PERFORMED  Mitchell [name] E 11/09/2015 10:03 AM

## 2015-11-09 NOTE — Plan of Care (Signed)
Problem: RH Ambulation Goal: LTG Patient will ambulate in controlled environment (PT) LTG: Patient will ambulate in a controlled environment, # of feet with assistance (PT).  With LRAD; downgrade 2/2 slow progress Goal: LTG Patient will ambulate in home environment (PT) LTG: Patient will ambulate in home environment, # of feet with assistance (PT).  71 ft with LRAD; downgrade 2/2 slow progress

## 2015-11-09 NOTE — Progress Notes (Signed)
Physical Therapy Session Note  Patient Details  Name: Mitchell [name] MRN: [id number] Date of Birth: November 28, 1934  Today's Date: 11/09/2015 PT Individual Time: 0904-1001 PT Individual Time Calculation (min): 57 min    Short Term Goals: Week 2:  PT Short Term Goal 1 (Week 2): Patient will perform sit<>stand and stand pivot with min A and max cues with RW.  PT Short Term Goal 2 (Week 2): Patient will ambulate 60ft with min A and RW.  PT Short Term Goal 3 (Week 2): Patient will perform bed mobility with min A and use of bed features.  PT Short Term Goal 4 (Week 2): Patient will perform WC mobility consistently with supervision Afor >165ft  PT Short Term Goal 5 (Week 2): Patient will initiate stair training.   Skilled Therapeutic Interventions/Progress Updates:    Pt received in bed & agreeable to PT, noting "my back hurts a little bit" but unable to rate. Pt's wife present for session & reports pt is almost back to his baseline cognition. Pt oriented to hospital when given a choice of 3, oriented to day of week & month but unable to recall year (reported "1986 or 7"). Pt able to transfer supine>sitting EOB via log rolling with multimodal cuing, min A & use of bed features. PT provided total A for donning ted hose, L shoe, R Cam boot, back brace & clothing. Pt able to complete sit<>stand transfers from elevated surfaces throughout session with Min A but maximum multimodal cuing for anterior weight shift & to push up with BUE's. Pt propelled w/c x 30 ft with BUE, min A for steering & heavy verbal cuing for technique. In gym, pt performed gait training x 28 ft + 25 ft with RW & mod A. Pt continues to remain impulsive with stand>sit transfers & requires maximum cuing to square up to chair, reach back with BUE & for increased eccentric control with movement. Pt with impaired balance during gait that is affected by R Cam boot. Provided demonstration & education for pt to perform pursed lip breathing, as pt  demonstrates increased respirations with ambulation, & pt able to demonstrate fair carryover. At end of session pt left in w/c in room with QRB in place, all needs within reach & wife present.  Therapy Documentation Precautions:  Precautions Precautions: Back Precaution Comments: unable to recall 0/3 back precautions.  Required Braces or Orthoses: Spinal Brace Spinal Brace: Applied in sitting position Restrictions Weight Bearing Restrictions: No Other Position/Activity Restrictions: CAM boot for RLE due to ankle fx, shoe for left foot    See Function Navigator for Current Functional Status.   Therapy/Group: Individual Therapy  [name] 11/09/2015, 7:58 AM

## 2015-11-09 NOTE — Plan of Care (Signed)
Problem: RH Balance Goal: LTG Patient will maintain dynamic sitting balance (PT) LTG:  Patient will maintain dynamic sitting balance with assistance during mobility activities (PT)  Downgrade 2/2 slow progress  Goal: LTG Patient will maintain dynamic standing balance (PT) LTG:  Patient will maintain dynamic standing balance with assistance during mobility activities (PT)  With LRAD; downgrade 2/2 slow progress  Problem: RH Bed to Chair Transfers Goal: LTG Patient will perform bed/chair transfers w/assist (PT) LTG: Patient will perform bed/chair transfers with assistance, with/without cues (PT).  Downgrade 2/2 slow progress

## 2015-11-10 ENCOUNTER — Inpatient Hospital Stay (HOSPITAL_COMMUNITY): Payer: Medicare Other | Admitting: Physical Therapy

## 2015-11-10 ENCOUNTER — Inpatient Hospital Stay (HOSPITAL_COMMUNITY): Payer: Medicare Other | Admitting: Occupational Therapy

## 2015-11-10 DIAGNOSIS — T814XXA Infection following a procedure, initial encounter: Secondary | ICD-10-CM

## 2015-11-10 DIAGNOSIS — Z5189 Encounter for other specified aftercare: Secondary | ICD-10-CM

## 2015-11-10 DIAGNOSIS — F29 Unspecified psychosis not due to a substance or known physiological condition: Secondary | ICD-10-CM

## 2015-11-10 DIAGNOSIS — F99 Mental disorder, not otherwise specified: Secondary | ICD-10-CM

## 2015-11-10 MED ORDER — ACETAMINOPHEN 325 MG PO TABS
650.0000 mg | ORAL_TABLET | Freq: Three times a day (TID) | ORAL | Status: DC | PRN
Start: 2015-11-10 — End: 2015-11-17
  Administered 2015-11-11 – 2015-11-17 (×15): 650 mg via ORAL
  Filled 2015-11-10 (×15): qty 2

## 2015-11-10 NOTE — Progress Notes (Signed)
Physical Therapy Session Note  Patient Details  Name: Mitchell [name] MRN: [id number] Date of Birth: 12-10-1934  Today's Date: 11/10/2015 PT Individual Time: 1004-1100 and  PT Individual Time Calculation (min): 56 min and     Short Term Goals: Week 2:  PT Short Term Goal 1 (Week 2): Patient will perform sit<>stand and stand pivot with min A and max cues with RW.  PT Short Term Goal 2 (Week 2): Patient will ambulate 52ft with min A and RW.  PT Short Term Goal 3 (Week 2): Patient will perform bed mobility with min A and use of bed features.  PT Short Term Goal 4 (Week 2): Patient will perform WC mobility consistently with supervision Afor >152ft  PT Short Term Goal 5 (Week 2): Patient will initiate stair training.   Skilled Therapeutic Interventions/Progress Updates:    Treatment 1: Pt received in w/c in handoff from OT & agreeable to PT; pt reported 3-4/10 back pain but per wife he was premedicated. Session focused on gait & stair training. Gait training x 35 ft + 43 ft with RW & min/mod A & +2 w/c follow for safety. Pt with impaired balance requiring Mod A at times to correct; pt very unsteady with R cam boot. Continued to educated pt on pursed lip breathing as pt quickly becomes SOB with activity but pt unable to follow instructions or return demonstrate technique. Stair training completed on 3" steps & B rails x 4 steps + 5 steps with Min/mod A & seated rest break in between. Pt requires verbal & tactile cuing to negotiate stairs with compensatory technique & reported feeling "swimmy headed" with second trial that ceased with sitting break. Pt performed standing hip flexion exercises for strengthening with Min A & RW for standing balance. At end of session pt left sitting in room in w/c with QRB in place, family present & all needs within reach.  Treatment 2: Pt received in bed & agreeable to PT, noting pain to be "about 10%" as he was unable to rate it on pain scale. Pt completed rolling  supine>L sidelying>sitting EOB with Min A & maximum multimodal cuing for sequencing & technique. Session focused on gait & stair training, endurance & strengthening. Throughout session pt completed sit>Stand transfers with min A with moderate cuing for anterior weight shift & to push up with BUE's, still requiring occasional cuing for proper hand placement. Gait training in hallway with min A + w/c follow for safety x 75 ft + 37 ft with RW. Pt requires frequent standing rest breaks throughout task 2/2 SOB and occasional mod A 2/2 loss of balance. Completed stair training x 8 steps (3") with B rails & moderate A with cuing for proper foot placement on each step. Progressed gait training to 6" steps but pt only able to negotiate 3 steps 2/2 fatigue. Pt requires significantly extra effort to flex B hips to ascend steps 2/2 weakness. Utilized nu-step up to level 5 x 10 minutes with BLE only & multiple rest breaks as needed, for endurance training. Pt reported need to use bathroom & was transported back to room via w/c total A for time management. Gait training x 15 ft + 15 ft to/from bathroom, (+) void but pt required assistance to doff/don pants. At end of session pt left sitting in w/c set up with meal tray & daughter present. Educated daughter to inform RN of when she leaves to allow nursing staff to put QRB on pt & she voiced understanding.  Throughout session pt with several instances of loss of balance during ambulation & stand pivot transfers, requiring mod A to correct. Pt continues to remain very impulsive and will transfer to sitting before squaring up to chair even with maximum cuing for safety.   Therapy Documentation Precautions:  Precautions Precautions: Back Precaution Comments: unable to recall 0/3 back precautions.  Required Braces or Orthoses: Spinal Brace Spinal Brace: Applied in sitting position Restrictions Weight Bearing Restrictions: No Other Position/Activity Restrictions: CAM boot  for RLE due to ankle fx, shoe for left foot  Pain: Pain Assessment Pain Assessment: 0-10 Pain Score: 3  Pain Location: Back Pain Intervention(s): Ambulation/increased activity (premedicated)   See Function Navigator for Current Functional Status.   Therapy/Group: Individual Therapy  [name] 11/10/2015, 8:02 AM

## 2015-11-10 NOTE — Progress Notes (Addendum)
[place] PHYSICAL MEDICINE & REHABILITATION     PROGRESS NOTE  Subjective/Complaints:  Pt laying in bed.  He is still very confused, however, wife states that he is doing much better and was oriented x3 yesterday.  She notes he is sleeping better as well.   ROS: Denies CP, SOB, N/V/D.  Objective: Vital Signs: Blood pressure 130/72, pulse 70, temperature 98.2 F (36.8 C), temperature source Oral, resp. rate 18, height 6\' 3"  (1.905 m), weight 124.8 kg (275 lb 1.6 oz), SpO2 94 %. No results found.  Recent Labs  11/07/15 1047  WBC 14.3*  HGB 10.4*  HCT 33.7*  PLT 567*    Recent Labs  11/07/15 1047  NA 132*  K 4.4  CL 97*  GLUCOSE 134*  BUN 6  CREATININE 0.68  CALCIUM 8.8*   CBG (last 3)  No results for input(s): GLUCAP in the last 72 hours.  Wt Readings from Last 3 Encounters:  11/10/15 124.8 kg (275 lb 1.6 oz)  10/24/15 122.5 kg (270 lb)  10/16/15 122.5 kg (270 lb)    Physical Exam:  BP 130/72 (BP Location: Left Arm)   Pulse 70   Temp 98.2 F (36.8 C) (Oral)   Resp 18   Ht 6\' 3"  (1.905 m)   Wt 124.8 kg (275 lb 1.6 oz)   SpO2 94%   BMI 34.39 kg/m  Constitutional: He appears well-developed and well-nourished.  HENT: Normocephalic and atraumatic.  Eyes: Conjunctivae are normal. Cardiovascular: Normal rate and regular rhythm.  Respiratory: Effort normal and breath sounds normal. No stridor. No respiratory distress. He has no wheezes.  GI: Soft. Bowel sounds are normal. He exhibits no distension. There is no tenderness.  Musculoskeletal: He exhibits edema. He exhibits no tenderness.  Neurological: He is alert and oriented x1. HOH Motor: B/l UE: 4+/5 grossly proximal to distal RLE: hip flexion, knee extension 4+/5, ankle dorsi/plantarflexion 4-/5 LLE: hip flexion, knee extension 4+/5, ankle dorsi/plantar flexion 4+/5  Skin: Skin is warm and dry. No erythema. Back incision with dressing from proximal small opening (improving drainage).  Psychiatric: He  has a normal mood and affect. Thought content normal.    Assessment/Plan: 1. Functional deficits secondary to lumbar radiculopathy L2/3 and kyphosis with L4/5 spondylolisthesis with hx of right foot drop which require 3+ hours per day of interdisciplinary therapy in a comprehensive inpatient rehab setting. Physiatrist is providing close team supervision and 24 hour management of active medical problems listed below. Physiatrist and rehab team continue to assess barriers to discharge/monitor patient progress toward functional and medical goals.  Function:  Bathing Bathing position   Position: Wheelchair/chair at sink  Bathing parts Body parts bathed by patient: Right arm, Left arm, Chest, Abdomen, Front perineal area, Right upper leg, Left upper leg, Right lower leg, Left lower leg Body parts bathed by helper: Back, Buttocks  Bathing assist Assist Level: Touching or steadying assistance(Pt > 75%), 2 helpers      Upper Body Dressing/Undressing Upper body dressing   What is the patient wearing?: Pull over shirt/dress, Orthosis     Pull over shirt/dress - Perfomed by patient: Thread/unthread right sleeve, Thread/unthread left sleeve, Put head through opening Pull over shirt/dress - Perfomed by helper: Pull shirt over trunk Button up shirt - Perfomed by patient: Thread/unthread right sleeve, Thread/unthread left sleeve, Button/unbutton shirt Button up shirt - Perfomed by helper: Pull shirt around back Orthosis activity level: Performed by helper  Upper body assist Assist Level: 2 helpers      Lower  Body Dressing/Undressing Lower body dressing   What is the patient wearing?: Pants, Underwear, [employer], Shoes Underwear - Performed by patient: Thread/unthread left underwear leg Underwear - Performed by helper: Thread/unthread right underwear leg, Pull underwear up/down, Thread/unthread left underwear leg Pants- Performed by patient: Thread/unthread right pants leg, Thread/unthread left  pants leg Pants- Performed by helper: Pull pants up/down, Thread/unthread right pants leg, Thread/unthread left pants leg           Shoes - Performed by helper: Don/doff right shoe, Don/doff left shoe, Fasten right, Fasten left       TED Hose - Performed by helper: Don/doff right TED hose, Don/doff left TED hose  Lower body assist Assist for lower body dressing: 2 Helpers      Toileting Toileting Toileting activity did not occur: N/A   Toileting steps completed by helper: Adjust clothing prior to toileting, Performs perineal hygiene, Adjust clothing after toileting Toileting Assistive Devices: Grab bar or rail  Toileting assist Assist level: Two helpers   Transfers Chair/bed transfer   Chair/bed transfer method: Stand pivot Chair/bed transfer assist level: Moderate assist (Pt 50 - 74%/lift or lower) Chair/bed transfer assistive device: Walker, Armrests, Bedrails     Locomotion Ambulation     Max distance: 28 ft Assist level: Touching or steadying assistance (Pt > 75%)   Wheelchair   Type: Manual Max wheelchair distance: 30 ft Assist Level: Touching or steadying assistance (Pt > 75%)  Cognition Comprehension Comprehension assist level: Understands basic 75 - 89% of the time/ requires cueing 10 - 24% of the time  Expression Expression assist level: Expresses basic 75 - 89% of the time/requires cueing 10 - 24% of the time. Needs helper to occlude trach/needs to repeat words.  Social Interaction Social Interaction assist level: Interacts appropriately 75 - 89% of the time - Needs redirection for appropriate language or to initiate interaction.  Problem Solving Problem solving assist level: Solves basic 25 - 49% of the time - needs direction more than half the time to initiate, plan or complete simple activities  Memory Memory assist level: Recognizes or recalls 25 - 49% of the time/requires cueing 50 - 75% of the time     Medical Problem List and Plan: 1. Gait and  cognitive deficits with weakness secondary to lumbar radiculopathy L2/3 and kyphosis with L4/5 spondylolisthesis with hx of right foot drop  Cont CIR 2. DVT Prophylaxis/Anticoagulation: Pharmaceutical: Lovenox added as 5 days post op.  3. Pain Management:  Ultram qid for now, d/ced on 7/19.   Increased tylenol on 7/24 4. Mood: LCSW to follow for evaluation and support.  5. Neuropsych: This patient is not capable of making decisions on his own behalf.  Klonipin d/ced  Added seroquel 12.5 qhs for agitation, restlessness on 7/21 due to medication sensitivity  CT head from 7/20 reviewed, no acute changes, small vessel disease  Ammonia normal 6. Skin/Wound Care: Monitor wound for healing. Maintain adequate nutrition and hydration status.   Surgical wound infection. Appreciate Neurosurg and ID recs.    IV Abx, now on PO Levoquin.   7. Fluids/Electrolytes/Nutrition: Monitor I/O.   Hyponatremia: 132 on 7/21 8. HTN: Monitor BP bid. On amlodipine and atenolol.  9. Ureteral stricture: Currently voiding without difficulty Monitor I/O.  10. Ankle fracture: To wear CAM boot when standing/walking. 11. Depression/Anxiety: On cymbalta 12. GERD: On protonix.  13. Asthma: Encourage IS. On dulera for hyperactive airway.  14. Constipation: Increased miralax to bid. 15. CAD: Cont meds 16. Obesity: Body mass  index is 33.75 kg/(m^2) on admission, diet and exercise education, encourage weight loss to increase endurance and promote overall health 17. Right ankle fracture: Cont boot 18. Chronic back pain: With severe central canal stenosis and foraminal narrowing: Cont meds 19. Right RTC tear and DJD right shoulder: Cont meds 20. Cognitive deficits with difficulty processing: Likely secondary to narcotics +/- infection. 21. Leukocytosis  Wound cx with Pseudomonas and Klebseilla.on Levaquin  WBCs 14.3 on 7/21 abx per ID  UA neg, Ucx multiple species 22. ABLA  Hb 10.4 on 7/21  Cont to  monitor 13. Confusion  Likely secondary to infection +/- medications  Will cont to monitor, medications also reviewed, pain medications adjusted, started on seroquel qHS.  LOS (Days) 12 A FACE TO FACE EVALUATION WAS PERFORMED  Ankit [name] 11/10/2015 9:56 AM

## 2015-11-10 NOTE — Progress Notes (Signed)
  Occupational Therapy Session Note  Patient Details  Name: HAFIZ IRION MRN: [id number] Date of Birth: [birth year]  Today's Date: 11/10/2015 OT Individual Time: 0905-1005 OT Individual Time Calculation (min): 60 min   Short Term Goals: Week 1:  OT Short Term Goal 1 (Week 1): Pt will sit to stand with min-mod A with RW to prepare for LB dressing. OT Short Term Goal 1 - Progress (Week 1): Partly met (pt fluctuates, mod assist at times, +2 at times) OT Short Term Goal 2 (Week 1): Pt will don pants over feet with reacher with min A. OT Short Term Goal 2 - Progress (Week 1): Progressing toward goal OT Short Term Goal 3 (Week 1): Pt will bathe LB with long sponge with mod A. OT Short Term Goal 3 - Progress (Week 1): Progressing toward goal OT Short Term Goal 4 (Week 1): Pt will transfer to [facility] using scoot transfer with min A. OT Short Term Goal 4 - Progress (Week 1): Not met   Week 2:  OT Short Term Goal 1 (Week 2): Pt will sit to stand with min assist with RW to prepare for LB dressing  OT Short Term Goal 2 (Week 2): Pt will don pants over feet with reacher with min assist  OT Short Term Goal 3 (Week 2): Pt will bathe LB with LH sponge with mod assist  OT Short Term Goal 4 (Week 2): Pt will transfer to [facility] using scoot transfer with min assist   Skilled Therapeutic Interventions/Progress Updates: Patient found supine in bed with wife present and no complaints of pain. Pt oriented to self, location, and situation. Pt disoriented to date - "1986". Pt engaged in bed mobility and sat EOB for ADL retraining of UB/LB bathing & dressing, using AE to increase independence (LH sponge and reacher this session). Therapist assisted with donning of bilateral TEDs, left shoe, and right CAM boot. Therapist educated and reiterated back precautions and importance of brace (lumbar corset) when OOB. Pt transferred EOB to w/c with mod assist for sit to stand and stand pivot transfer using RW. Pt sat at sink for  grooming task of hair care, wife assisting. Pt then requested to use restroom. Therapist propelled pt into BR and pt transferred w/c to [facility] seated over toilet seat with mod assist. Pt with difficulty performing clothing management tasks due to decreased dynamic standing balance with no UE support. Skilled intervention focusing on ADL retraining, sit to/from stands, functional transfers, safety, education, and overall activity tolerance/endurance. Pt took increased time to complete tasks, but is overall doing better than last week. At end of session, left pt seated in w/c with wife present and PT entering room.   Therapy Documentation Precautions:  Precautions Precautions: Back Precaution Comments: unable to recall 0/3 back precautions.  Required Braces or Orthoses: Spinal Brace Spinal Brace: Applied in sitting position Restrictions Weight Bearing Restrictions: No Other Position/Activity Restrictions: CAM boot for RLE due to ankle fx, shoe for left foot  Vital Signs: Therapy Vitals Temp: 98.2 F (36.8 C) Temp Source: Oral Pulse Rate: 70 Resp: 18 BP: 130/72 Patient Position (if appropriate): Lying Oxygen Therapy SpO2: 94 % O2 Device: Not Delivered  See Function Navigator for Current Functional Status.  Therapy/Group: Individual Therapy  Chrys Racer , MS, OTR/L, CLT  11/10/2015, 10:07 AM

## 2015-11-10 NOTE — Progress Notes (Addendum)
    [facility] for Infectious Disease   Reason for visit: Follow up on wound infection  Interval History: no fever, less confusion off of pain medications  Physical Exam: Constitutional:  Vitals:   11/10/15 0410 11/10/15 1338  BP: 130/72 (!) 119/56  Pulse: 70 75  Resp: 18 18  Temp: 98.2 F (36.8 C) 98.3 F (36.8 C)   patient appears in NAD Respiratory: Normal respiratory effort; CTA B Cardiovascular: RRR Back with same two small areas open with serosanguinous drainage, no surrounding erythema, no pus.  Review of Systems: Constitutional: negative for anorexia Gastrointestinal: negative for diarrhea  Lab Results  Component Value Date   WBC 14.3 (H) 11/07/2015   HGB 10.4 (L) 11/07/2015   HCT 33.7 (L) 11/07/2015   MCV 97.1 11/07/2015   PLT 567 (H) 11/07/2015    Lab Results  Component Value Date   CREATININE 0.68 11/07/2015   BUN 6 11/07/2015   NA 132 (L) 11/07/2015   K 4.4 11/07/2015   CL 97 (L) 11/07/2015   CO2 26 11/07/2015    Lab Results  Component Value Date   ALT 10 (L) 10/30/2015   AST 22 10/30/2015   ALKPHOS 80 10/30/2015     Microbiology: Recent Results (from the past 240 hour(s))  Aerobic Culture (superficial specimen)     Status: None   Collection Time: 11/02/15 11:19 AM  Result Value Ref Range Status   Specimen Description WOUND BACK  Final   Special Requests Normal  Final   Gram Stain   Final    MODERATE WBC PRESENT, PREDOMINANTLY PMN FEW GRAM NEGATIVE RODS RARE GRAM POSITIVE COCCI IN CLUSTERS    Culture   Final    ABUNDANT PSEUDOMONAS AERUGINOSA MODERATE KLEBSIELLA PNEUMONIAE    Report Status 11/05/2015 FINAL  Final   Organism ID, Bacteria PSEUDOMONAS AERUGINOSA  Final   Organism ID, Bacteria KLEBSIELLA PNEUMONIAE  Final      Susceptibility   Klebsiella pneumoniae - MIC*    AMPICILLIN 16 RESISTANT Resistant     CEFAZOLIN <=4 SENSITIVE Sensitive     CEFEPIME <=1 SENSITIVE Sensitive     CEFTAZIDIME <=1 SENSITIVE Sensitive    CEFTRIAXONE <=1 SENSITIVE Sensitive     CIPROFLOXACIN <=0.25 SENSITIVE Sensitive     GENTAMICIN <=1 SENSITIVE Sensitive     IMIPENEM <=0.25 SENSITIVE Sensitive     TRIMETH/SULFA <=20 SENSITIVE Sensitive     AMPICILLIN/SULBACTAM 8 SENSITIVE Sensitive     PIP/TAZO 8 SENSITIVE Sensitive     * MODERATE KLEBSIELLA PNEUMONIAE   Pseudomonas aeruginosa - MIC*    CEFTAZIDIME 2 SENSITIVE Sensitive     CIPROFLOXACIN <=0.25 SENSITIVE Sensitive     GENTAMICIN <=1 SENSITIVE Sensitive     IMIPENEM 1 SENSITIVE Sensitive     PIP/TAZO 8 SENSITIVE Sensitive     CEFEPIME <=1 SENSITIVE Sensitive     * ABUNDANT PSEUDOMONAS AERUGINOSA    Impression/Plan:  1. Wound infection - improved.  No current signs of infection.  Will do levaquin 3 more days and stop for 7 total.  Stop date already in.   2.  Confusion - improved off of medications according to his wife.  I will sign off, please call with any questions

## 2015-11-11 ENCOUNTER — Inpatient Hospital Stay (HOSPITAL_COMMUNITY): Payer: Medicare Other | Admitting: Physical Therapy

## 2015-11-11 ENCOUNTER — Inpatient Hospital Stay (HOSPITAL_COMMUNITY): Payer: Medicare Other | Admitting: Occupational Therapy

## 2015-11-11 ENCOUNTER — Inpatient Hospital Stay (HOSPITAL_COMMUNITY): Payer: Medicare Other

## 2015-11-11 LAB — CBC WITH DIFFERENTIAL/PLATELET
Basophils Absolute: 0 10*3/uL (ref 0.0–0.1)
Basophils Relative: 0 %
Eosinophils Absolute: 0.1 10*3/uL (ref 0.0–0.7)
Eosinophils Relative: 1 %
HCT: 35 % — ABNORMAL LOW (ref 39.0–52.0)
Hemoglobin: 10.9 g/dL — ABNORMAL LOW (ref 13.0–17.0)
Lymphocytes Relative: 22 %
Lymphs Abs: 2.5 10*3/uL (ref 0.7–4.0)
MCH: 29.8 pg (ref 26.0–34.0)
MCHC: 31.1 g/dL (ref 30.0–36.0)
MCV: 95.6 fL (ref 78.0–100.0)
Monocytes Absolute: 0.8 10*3/uL (ref 0.1–1.0)
Monocytes Relative: 8 %
Neutro Abs: 7.7 10*3/uL (ref 1.7–7.7)
Neutrophils Relative %: 69 %
Platelets: 510 10*3/uL — ABNORMAL HIGH (ref 150–400)
RBC: 3.66 MIL/uL — ABNORMAL LOW (ref 4.22–5.81)
RDW: 14.3 % (ref 11.5–15.5)
WBC: 11.2 10*3/uL — ABNORMAL HIGH (ref 4.0–10.5)

## 2015-11-11 LAB — BASIC METABOLIC PANEL
Anion gap: 8 (ref 5–15)
BUN: 8 mg/dL (ref 6–20)
CO2: 27 mmol/L (ref 22–32)
Calcium: 8.7 mg/dL — ABNORMAL LOW (ref 8.9–10.3)
Chloride: 102 mmol/L (ref 101–111)
Creatinine, Ser: 0.69 mg/dL (ref 0.61–1.24)
GFR calc Af Amer: >60 mL/min (ref >60)
GFR calc non Af Amer: >60 mL/min (ref >60)
Glucose, Bld: 149 mg/dL — ABNORMAL HIGH (ref 65–99)
Potassium: 4.1 mmol/L (ref 3.5–5.1)
Sodium: 137 mmol/L (ref 135–145)

## 2015-11-11 MED ORDER — LIDOCAINE HCL 2 % EX GEL: CUTANEOUS | Status: DC | PRN

## 2015-11-11 NOTE — Progress Notes (Signed)
Patient ID: Mitchell [name], male   DOB: Mar 02, 1935, 81 y.o.   MRN: [id number]   Pt not in room. Will visit again. Pleased to hear of improving mobility, decreasing drainage, and decreasing confusion.  Verdis Prime RN BSN

## 2015-11-11 NOTE — Progress Notes (Signed)
[place] PHYSICAL MEDICINE & REHABILITATION     PROGRESS NOTE  Subjective/Complaints:  Pt laying in bed.  He slept well overnight per his wife.  She notes that he is "lots better".    ROS: Denies CP, SOB, N/V/D.  Objective: Vital Signs: Blood pressure (!) 120/48, pulse 67, temperature 98.6 F (37 C), temperature source Oral, resp. rate 18, height 6\' 3"  (1.905 m), weight 124.8 kg (275 lb 1.6 oz), SpO2 96 %. No results found. No results for input(s): WBC, HGB, HCT, PLT in the last 72 hours. No results for input(s): NA, K, CL, GLUCOSE, BUN, CREATININE, CALCIUM in the last 72 hours.  Invalid input(s): CO CBG (last 3)  No results for input(s): GLUCAP in the last 72 hours.  Wt Readings from Last 3 Encounters:  11/10/15 124.8 kg (275 lb 1.6 oz)  10/24/15 122.5 kg (270 lb)  10/16/15 122.5 kg (270 lb)    Physical Exam:  BP (!) 120/48 (BP Location: Left Arm)   Pulse 67   Temp 98.6 F (37 C) (Oral)   Resp 18   Ht 6\' 3"  (1.905 m)   Wt 124.8 kg (275 lb 1.6 oz)   SpO2 96%   BMI 34.39 kg/m  Constitutional: He appears well-developed and well-nourished.  HENT: Normocephalic and atraumatic.  Eyes: Conjunctivae are normal. Cardiovascular: Normal rate and regular rhythm.  Respiratory: Effort normal and breath sounds normal. No stridor. No respiratory distress. He has no wheezes.  GI: Soft. Bowel sounds are normal. He exhibits no distension. There is no tenderness.  Musculoskeletal: He exhibits edema. He exhibits no tenderness.  Neurological: He is alert and oriented x1. HOH Motor: B/l UE: 4+/5 grossly proximal to distal RLE: hip flexion, knee extension 4+/5, ankle dorsi/plantarflexion 4-/5 LLE: hip flexion, knee extension 4+/5, ankle dorsi/plantar flexion 4+/5  Skin: Skin is warm and dry. No erythema. Back incision with dressing from proximal small opening (improving drainage).  Psychiatric: He has a normal mood and affect. Thought content normal.    Assessment/Plan: 1.  Functional deficits secondary to lumbar radiculopathy L2/3 and kyphosis with L4/5 spondylolisthesis with hx of right foot drop which require 3+ hours per day of interdisciplinary therapy in a comprehensive inpatient rehab setting. Physiatrist is providing close team supervision and 24 hour management of active medical problems listed below. Physiatrist and rehab team continue to assess barriers to discharge/monitor patient progress toward functional and medical goals.  Function:  Bathing Bathing position   Position: Wheelchair/chair at sink  Bathing parts Body parts bathed by patient: Right arm, Left arm, Chest, Abdomen, Front perineal area, Right upper leg, Left upper leg, Right lower leg, Left lower leg Body parts bathed by helper: Back, Buttocks  Bathing assist Assist Level: Touching or steadying assistance(Pt > 75%), 2 helpers      Upper Body Dressing/Undressing Upper body dressing   What is the patient wearing?: Pull over shirt/dress, Orthosis     Pull over shirt/dress - Perfomed by patient: Thread/unthread right sleeve, Thread/unthread left sleeve, Put head through opening Pull over shirt/dress - Perfomed by helper: Pull shirt over trunk Button up shirt - Perfomed by patient: Thread/unthread right sleeve, Thread/unthread left sleeve, Button/unbutton shirt Button up shirt - Perfomed by helper: Pull shirt around back Orthosis activity level: Performed by helper  Upper body assist Assist Level: 2 helpers      Lower Body Dressing/Undressing Lower body dressing   What is the patient wearing?: Pants, Underwear, Ted Hose, Shoes Underwear - Performed by patient: Thread/unthread left underwear leg  Underwear - Performed by helper: Thread/unthread right underwear leg, Pull underwear up/down, Thread/unthread left underwear leg Pants- Performed by patient: Thread/unthread right pants leg, Thread/unthread left pants leg Pants- Performed by helper: Pull pants up/down, Thread/unthread right  pants leg, Thread/unthread left pants leg           Shoes - Performed by helper: Don/doff right shoe, Don/doff left shoe, Fasten right, Fasten left       TED Hose - Performed by helper: Don/doff right TED hose, Don/doff left TED hose  Lower body assist Assist for lower body dressing: 2 Helpers      Toileting Toileting Toileting activity did not occur: N/A   Toileting steps completed by helper: Adjust clothing prior to toileting, Performs perineal hygiene, Adjust clothing after toileting Toileting Assistive Devices: Grab bar or rail  Toileting assist Assist level: Two helpers   Transfers Chair/bed transfer   Chair/bed transfer method: Stand pivot Chair/bed transfer assist level: Moderate assist (Pt 50 - 74%/lift or lower) Chair/bed transfer assistive device: Medical sales representative     Max distance: 70 ft Assist level: 2 helpers (min A + w/c follow)   Wheelchair   Type: Manual Max wheelchair distance: 30 ft Assist Level: Touching or steadying assistance (Pt > 75%)  Cognition Comprehension Comprehension assist level: Understands basic 75 - 89% of the time/ requires cueing 10 - 24% of the time  Expression Expression assist level: Expresses basic 75 - 89% of the time/requires cueing 10 - 24% of the time. Needs helper to occlude trach/needs to repeat words.  Social Interaction Social Interaction assist level: Interacts appropriately 75 - 89% of the time - Needs redirection for appropriate language or to initiate interaction.  Problem Solving Problem solving assist level: Solves basic 25 - 49% of the time - needs direction more than half the time to initiate, plan or complete simple activities  Memory Memory assist level: Recognizes or recalls 25 - 49% of the time/requires cueing 50 - 75% of the time     Medical Problem List and Plan: 1. Gait and cognitive deficits with weakness secondary to lumbar radiculopathy L2/3 and kyphosis with L4/5 spondylolisthesis with hx  of right foot drop  Cont CIR 2. DVT Prophylaxis/Anticoagulation: Pharmaceutical: Lovenox added as 5 days post op.  3. Pain Management:  Ultram qid for now, d/ced on 7/19.   Increased tylenol on 7/24 4. Mood: LCSW to follow for evaluation and support.  5. Neuropsych: This patient is not capable of making decisions on his own behalf.  Klonipin d/ced  Added seroquel 12.5 qhs for agitation, restlessness on 7/21 due to medication sensitivity, will plan to d/c tomorrow  CT head from 7/20 reviewed, no acute changes, small vessel disease  Ammonia normal 6. Skin/Wound Care: Monitor wound for healing. Maintain adequate nutrition and hydration status.   Surgical wound infection. Appreciate Neurosurg and ID recs.    IV Abx, now on PO Levoquin.   7. Fluids/Electrolytes/Nutrition: Monitor I/O.   Hyponatremia: 132 on 7/21  Labs pending for today 8. HTN: Monitor BP bid. On amlodipine and atenolol.  9. Ureteral stricture: Currently voiding without difficulty Monitor I/O.  10. Ankle fracture: To wear CAM boot when standing/walking. 11. Depression/Anxiety: On cymbalta 12. GERD: On protonix.  13. Asthma: Encourage IS. On dulera for hyperactive airway.  14. Constipation: Increased miralax to bid. 15. CAD: Cont meds 16. Obesity: Body mass index is 33.75 kg/(m^2) on admission, diet and exercise education, encourage weight loss to increase endurance and promote  overall health 17. Right ankle fracture: Cont boot 18. Chronic back pain: With severe central canal stenosis and foraminal narrowing: Cont meds 19. Right RTC tear and DJD right shoulder: Cont meds 20. Cognitive deficits with difficulty processing: Likely secondary to narcotics +/- infection. 21. Leukocytosis  Wound cx with Pseudomonas and Klebseilla.on Levaquin  WBCs 14.3 on 7/21 abx per ID  UA neg, Ucx multiple species  Labs pending for today 22. ABLA  Hb 10.4 on 7/21  Labs pending for today  Cont to monitor 13.  Confusion: Improving per wife  Likely secondary to infection +/- medications  Will cont to monitor, medications also reviewed, pain medications adjusted, started on seroquel qHS.  LOS (Days) 13 A FACE TO FACE EVALUATION WAS PERFORMED  Ankit [name] 11/11/2015 8:54 AM

## 2015-11-11 NOTE — Progress Notes (Signed)
Physical Therapy Session Note  Patient Details  Name: Mitchell [name] MRN: [id number] Date of Birth: December 27, 1934  Today's Date: 11/11/2015 PT Individual Time: 1005-1130 and 1334-1415 and [id number] PT Individual Time Calculation (min): 85 min and 41 min and 31 min   Short Term Goals: Week 2:  PT Short Term Goal 1 (Week 2): Patient will perform sit<>stand and stand pivot with min A and max cues with RW.  PT Short Term Goal 2 (Week 2): Patient will ambulate 42ft with min A and RW.  PT Short Term Goal 3 (Week 2): Patient will perform bed mobility with min A and use of bed features.  PT Short Term Goal 4 (Week 2): Patient will perform WC mobility consistently with supervision Afor >164ft  PT Short Term Goal 5 (Week 2): Patient will initiate stair training.   Skilled Therapeutic Interventions/Progress Updates:    Treatment 1: Pt received in w/c & agreeable to PT, noting 2/10 pain but per wife pt was premedicated. Transported pt to rehab gym for gait training in hallway. Gait training x 50 ft + 50 ft + 50 ft with RW & min A + w/c follow for safety. Pt continues to fatigue quickly & requires sitting & standing rest breaks during task. Pt with min A for completing turns & cuing to ambulate within base of RW during turns. Throughout session pt performed sit<>stand transfers with Min A and cuing for anterior weight shift. Standing balance challenged by having pt reach for horseshoes outside of his BOS with 1UE support on RW. Pt required mod A for task, as he demonstrates significant posterior lean & B knee flexion. Provided maximum multimodal cuing and demonstration for reaching and anterior weight shifts with poor/fair carryover by pt. Attempted to complete stair training on 6" steps but pt only able to negotiate 1 step with B rails & mod A before fatigue & need to sit. Pt reported need to use restroom & was transported back to room via w/c total A. Pt completed stand pivot w/c<>elevated toilet with Mod A &  use of grab bars & armrests, (+) BM & void. Pt required total A for peri-hygiene. At end of session pt left sitting in w/c with all needs within reach & QRB In place. Pt is oriented to location ([place]), time (month only), self with PT reorienting pt throughout session.   Treatment 2: Pt received in w/c & agreeable to PT noting 2-3/10 back pain. Pt transported to gym via w/c total A for time management. Pt transferred w/c>nu-step via stand pivot with RW & min/mod A with cuing for proper hand placement for sit<>stand portion of transfer. Utilized nu-step level 5 x 12 minutes (BLE only) with 2 rest breaks for endurance training. Pt with insufficient strength to keep RLE on pedal and required frequent assistance to place foot on pedal. Pt transferred nu-step>w/c in same manner as noted above. Pt propelled w/c x 100 ft with Min A for navigating doorways and supervision for linear path in controlled environment x 100 ft for cardiovascular endurance training. At end of session pt left in w/c with all needs within reach & wife present.   Treatment 3: Pt received in bed & agreeable to PT, noting 4/10 pain & RN made aware. Pt rolled supine>L sidelying>sitting EOB with multimodal cuing for sequencing & technique to maintain back precautions. PT donned cam boot, shoe & back brace total A for time management. Pt completed sit<>stand and stand pivot bed>w/c with RW & min/mod A.  Pt continues to require cuing for proper hand placement & anterior weight shift as he does not have any carryover during or between sessions. Transported pt to gym via w/c total A for time management & completed stair training. Stair training completed on 6" steps x 4 steps with B rails & mod A. Pt with significant fatigue & requires rest breaks to negotiate 4 steps as well as cuing for compensatory strategy. MD arrived during session & verbally cleared pt to not wear R CAM boot any longer. PT donned pt's R shoe & foot up brace (wife  reports he wears foot-up brace 2/2 neuropathy and decreased R foot clearance). Pt negotiated a single step with mod A & B rails before requiring a rest break. During session pt unable to recall current year, instead stating it is "2023" with PT reorienting pt. Discussed with pt's wife this therapist's observation of impaired cognition at all times throughout the day. At end of session pt left in w/c in room with all needs within reach & set up with dinner tray.   Therapy Documentation Precautions:  Precautions Precautions: Back Precaution Comments: unable to recall 0/3 back precautions.  Required Braces or Orthoses: Spinal Brace Spinal Brace: Applied in sitting position Restrictions Weight Bearing Restrictions: No Other Position/Activity Restrictions: CAM boot for RLE due to ankle fx, shoe for left foot  Pain: Pain Assessment Pain Assessment: 0-10 Pain Score: 2  Pain Location: Back Pain Intervention(s):  (premedicated)   See Function Navigator for Current Functional Status.   Therapy/Group: Individual Therapy  Mitchell [name] 11/11/2015, 7:55 AM

## 2015-11-11 NOTE — Progress Notes (Signed)
Occupational Therapy Session Notes  Patient Details  Name: Mitchell [name] MRN: [id number] Date of Birth: March 29, 1935  Today's Date: 11/11/2015  Short Term Goals: Week 1:  OT Short Term Goal 1 (Week 1): Pt will sit to stand with min-mod A with RW to prepare for LB dressing. OT Short Term Goal 1 - Progress (Week 1): Partly met (pt fluctuates, mod assist at times, +2 at times) OT Short Term Goal 2 (Week 1): Pt will don pants over feet with reacher with min A. OT Short Term Goal 2 - Progress (Week 1): Progressing toward goal OT Short Term Goal 3 (Week 1): Pt will bathe LB with long sponge with mod A. OT Short Term Goal 3 - Progress (Week 1): Progressing toward goal OT Short Term Goal 4 (Week 1): Pt will transfer to [facility] using scoot transfer with min A. OT Short Term Goal 4 - Progress (Week 1): Not met   Week 2:  OT Short Term Goal 1 (Week 2): Pt will sit to stand with min assist with RW to prepare for LB dressing  OT Short Term Goal 2 (Week 2): Pt will don pants over feet with reacher with min assist  OT Short Term Goal 3 (Week 2): Pt will bathe LB with LH sponge with mod assist  OT Short Term Goal 4 (Week 2): Pt will transfer to [facility] using scoot transfer with min assist   Skilled Therapeutic Interventions/Progress Updates:   Session #1 [phone] - 60 Minutes Individual Therapy Minimal complaints of pain around back, monitored during session (no rate given) Pt found supine in bed finishing breakfast. Pt oriented X4. Pt engaged in bed mobility, sat EOB, and performed UB/LB bathing & dressing in sit to/from stand position (only performing sit to/from stands with lumbar corset donned). Focused on forming a routine with patient and encouraged pt to initiate tasks so he will be more independent with ADL. Skilled intervention focusing on ADL retraining, sit to/from stands, safety with RW, back precautions, back brace wearing orders, functional transfer EOB to w/c, dynamic standing balance, and overall  activity tolerance/endurance. At end of session, left pt seated in w/c with wife present - wife handing patient Copy for shaving. Wife aware to notify staff if she leaves for staff to don quick release belt.   Session #2 [id number] - 30 Minutes Individual Therapy Minimal complaints of pain around back, monitored during session (no rate given) Patient found seated in w/c after long PT session. PT found this OT and notified her that pt tired from last therapy session. Pt stated he had not brushed his teeth yet. Therapist assisted pt to sink. Discussed back precautions of no bending, no arching, no twisting. Administered a "spit" cup and a "rinse" cup for patient and encouraged him to use this so he did not bend during brushing of teeth. Therapist had to provide verbal and instructional cueing for patient to understand what to do. After cueing, pt able to independently brush teeth without breaking back precautions and without getting water everywhere. Therapist printed off Back Precautions handouts that stated "No Bending No Arching No Twisting" and posted these around patient's room. Also marked off calendar days that have already passed. At end of session, left pt seated in w/c with all needs within reach, lunch tray, and quick release belt donned.   Therapy Documentation Precautions:  Precautions Precautions: Back Precaution Comments: unable to recall 0/3 back precautions.  Required Braces or Orthoses: Spinal Brace Spinal Brace: Applied in sitting position  Restrictions Weight Bearing Restrictions: No Other Position/Activity Restrictions: CAM boot for RLE due to ankle fx, shoe for left foot  Vital Signs: Therapy Vitals Temp: 98.6 F (37 C) Temp Source: Oral Pulse Rate: 67 Resp: 18 BP: (!) 120/48 Patient Position (if appropriate): Lying Oxygen Therapy SpO2: 96 % O2 Device: Not Delivered  ADL: ADL ADL Comments: Refer to functional navigator  See Function Navigator for  Current Functional Status.  Therapy/Group: Individual Therapy  Chrys Racer , MS, OTR/L, CLT  11/11/2015, 9:09 AM

## 2015-11-11 NOTE — Progress Notes (Signed)
Patient voided 288ml in urinal; bladder scan PVR 384 mL 2330.  Wife refused I&O coude cath per order.  RN educated both patient and wife, will reassess and give time for patient to void again.   0030 RN and NT checked on patient, no void, bladder scan 466.  Attempted void EOB without success.  Again discussed with patient and wife importance of coude cath, wife refused "I refuse to let you do this right now, there's not a big difference between now and tomorrow.  His urologist from [facility] said not to let anyone insert a catheter unless it was him or another urologist because of his strictures.  I'm going to have to let my daughter discuss this with the doctor, she's a veterinarian so she's knows more than I do. I know you're doing your job but I don't want something else to happen to him."  Will continue to monitor patient.

## 2015-11-11 NOTE — Progress Notes (Signed)
I examined the patient today while in rehab.  He states he has no pain in the ankle.  His swelling is resolved.  I removed the CAM walker and ordered xrays.  Patient may ambulate with his regular shoes at this point.    Azucena Cecil, MD [place] 4:57 PM

## 2015-11-12 ENCOUNTER — Inpatient Hospital Stay (HOSPITAL_COMMUNITY): Payer: Medicare Other | Admitting: Occupational Therapy

## 2015-11-12 ENCOUNTER — Inpatient Hospital Stay (HOSPITAL_COMMUNITY): Payer: Medicare Other | Admitting: Physical Therapy

## 2015-11-12 DIAGNOSIS — T148 Other injury of unspecified body region: Secondary | ICD-10-CM

## 2015-11-12 DIAGNOSIS — T148XXA Other injury of unspecified body region, initial encounter: Secondary | ICD-10-CM | POA: Insufficient documentation

## 2015-11-12 LAB — CREATININE, SERUM
Creatinine, Ser: 0.65 mg/dL (ref 0.61–1.24)
GFR calc Af Amer: >60 mL/min (ref >60)
GFR calc non Af Amer: >60 mL/min (ref >60)

## 2015-11-12 MED ORDER — TRAZODONE HCL 50 MG PO TABS
25.0000 mg | ORAL_TABLET | Freq: Every evening | ORAL | Status: DC | PRN
  Filled 2015-11-12: qty 1

## 2015-11-12 NOTE — Progress Notes (Signed)
[place] PHYSICAL MEDICINE & REHABILITATION     PROGRESS NOTE  Subjective/Complaints:  Pt laying in bed.  He appears more alert this AM.  Wife at bedside, she states he has significantly improved over the last few days.    ROS: Denies CP, SOB, N/V/D.  Objective: Vital Signs: Blood pressure (!) 117/56, pulse 67, temperature 97.7 F (36.5 C), temperature source Oral, resp. rate 15, height 6\' 3"  (1.905 m), weight 124.8 kg (275 lb 1.6 oz), SpO2 97 %. Dg Ankle Complete Right  Result Date: 11/11/2015 CLINICAL DATA:  Followup of right ankle fracture. EXAM: RIGHT ANKLE - COMPLETE 3+ VIEW COMPARISON:  09/17/2015 FINDINGS: diffuse soft tissue swelling is increased. No significant healing across the previously described distal fibular fracture. Base of fifth metatarsal and talar dome intact. Vascular calcifications. Tibiotalar osteoarthritis. Possible remote medial malleolar avulsion fracture. IMPRESSION: No significant healing of distal fibular fracture since 09/17/2015. Increase in diffuse soft tissue swelling. Electronically Signed   By: Abigail Miyamoto M.D.   On: 11/11/2015 19:51   Recent Labs  11/11/15 0838  WBC 11.2*  HGB 10.9*  HCT 35.0*  PLT 510*    Recent Labs  11/11/15 0838 11/12/15 0729  NA 137  --   K 4.1  --   CL 102  --   GLUCOSE 149*  --   BUN 8  --   CREATININE 0.69 0.65  CALCIUM 8.7*  --    CBG (last 3)  No results for input(s): GLUCAP in the last 72 hours.  Wt Readings from Last 3 Encounters:  11/10/15 124.8 kg (275 lb 1.6 oz)  10/24/15 122.5 kg (270 lb)  10/16/15 122.5 kg (270 lb)    Physical Exam:  BP (!) 117/56 (BP Location: Left Arm)   Pulse 67   Temp 97.7 F (36.5 C) (Oral)   Resp 15   Ht 6\' 3"  (1.905 m)   Wt 124.8 kg (275 lb 1.6 oz)   SpO2 97%   BMI 34.39 kg/m  Constitutional: He appears well-developed and well-nourished.  HENT: Normocephalic and atraumatic.  Eyes: Conjunctivae are normal. Cardiovascular: Normal rate and regular rhythm.   Respiratory: Effort normal and breath sounds normal. No stridor. No respiratory distress. He has no wheezes.  GI: Soft. Bowel sounds are normal. He exhibits no distension. There is no tenderness.  Musculoskeletal: He exhibits edema. He exhibits no tenderness.  Neurological: He is alert and oriented x2. HOH Motor: B/l UE: 4+/5 grossly proximal to distal RLE: hip flexion, knee extension 4+/5, ankle dorsi/plantarflexion 4-/5 LLE: hip flexion, knee extension 4+/5, ankle dorsi/plantar flexion 4+/5  Skin: Skin is warm and dry. No erythema. Back incision with dressing from proximal small opening (improving drainage).  Psychiatric: He has a normal mood and affect. Thought content normal.    Assessment/Plan: 1. Functional deficits secondary to lumbar radiculopathy L2/3 and kyphosis with L4/5 spondylolisthesis with hx of right foot drop which require 3+ hours per day of interdisciplinary therapy in a comprehensive inpatient rehab setting. Physiatrist is providing close team supervision and 24 hour management of active medical problems listed below. Physiatrist and rehab team continue to assess barriers to discharge/monitor patient progress toward functional and medical goals.  Function:  Bathing Bathing position   Position: Wheelchair/chair at sink  Bathing parts Body parts bathed by patient: Right arm, Left arm, Chest, Abdomen, Front perineal area, Right upper leg, Left upper leg, Right lower leg, Left lower leg Body parts bathed by helper: Back, Buttocks  Bathing assist Assist Level: Touching or  steadying assistance(Pt > 75%), 2 helpers      Upper Body Dressing/Undressing Upper body dressing   What is the patient wearing?: Pull over shirt/dress, Orthosis     Pull over shirt/dress - Perfomed by patient: Thread/unthread right sleeve, Thread/unthread left sleeve, Put head through opening Pull over shirt/dress - Perfomed by helper: Pull shirt over trunk Button up shirt - Perfomed by patient:  Thread/unthread right sleeve, Thread/unthread left sleeve, Button/unbutton shirt Button up shirt - Perfomed by helper: Pull shirt around back Orthosis activity level: Performed by helper  Upper body assist Assist Level: 2 helpers      Lower Body Dressing/Undressing Lower body dressing   What is the patient wearing?: Pants, Underwear, [employer], Shoes Underwear - Performed by patient: Thread/unthread left underwear leg Underwear - Performed by helper: Thread/unthread right underwear leg, Pull underwear up/down, Thread/unthread left underwear leg Pants- Performed by patient: Thread/unthread right pants leg, Thread/unthread left pants leg Pants- Performed by helper: Pull pants up/down, Thread/unthread right pants leg, Thread/unthread left pants leg           Shoes - Performed by helper: Don/doff right shoe, Don/doff left shoe, Fasten right, Fasten left       TED Hose - Performed by helper: Don/doff right TED hose, Don/doff left TED hose  Lower body assist Assist for lower body dressing: 2 Helpers      Toileting Toileting Toileting activity did not occur: N/A   Toileting steps completed by helper: Adjust clothing prior to toileting, Performs perineal hygiene, Adjust clothing after toileting Toileting Assistive Devices: Grab bar or rail  Toileting assist Assist level: Two helpers   Transfers Chair/bed transfer   Chair/bed transfer method: Stand pivot Chair/bed transfer assist level: Moderate assist (Pt 50 - 74%/lift or lower) Chair/bed transfer assistive device: Medical sales representative     Max distance: 50 ft Assist level: 2 helpers (min A + w/c follow for safety)   Wheelchair   Type: Manual Max wheelchair distance: 100 ft Assist Level: Touching or steadying assistance (Pt > 75%)  Cognition Comprehension Comprehension assist level: Understands basic 75 - 89% of the time/ requires cueing 10 - 24% of the time  Expression Expression assist level: Expresses basic 75  - 89% of the time/requires cueing 10 - 24% of the time. Needs helper to occlude trach/needs to repeat words.  Social Interaction Social Interaction assist level: Interacts appropriately 90% of the time - Needs monitoring or encouragement for participation or interaction.  Problem Solving Problem solving assist level: Solves basic 50 - 74% of the time/requires cueing 25 - 49% of the time  Memory Memory assist level: Recognizes or recalls 50 - 74% of the time/requires cueing 25 - 49% of the time     Medical Problem List and Plan: 1. Gait and cognitive deficits with weakness secondary to lumbar radiculopathy L2/3 and kyphosis with L4/5 spondylolisthesis with hx of right foot drop  Cont CIR 2. DVT Prophylaxis/Anticoagulation: Pharmaceutical: Lovenox added as 5 days post op.  3. Pain Management:  Ultram qid for now, d/ced on 7/19.   Increased tylenol on 7/24 4. Mood: LCSW to follow for evaluation and support.  5. Neuropsych: This patient is not capable of making decisions on his own behalf.  Klonipin d/ced  Seroquel 12.5 qhs d/ced on 7/26  Trazodone 25mg  PRN  CT head from 7/20 reviewed, no acute changes, small vessel disease  Ammonia normal 6. Skin/Wound Care: Monitor wound for healing. Maintain adequate nutrition and hydration status.  Surgical wound infection. Appreciate Neurosurg and ID recs.    IV Abx, now on PO Levoquin.   7. Fluids/Electrolytes/Nutrition: Monitor I/O.   Hyponatremia: Resolve.d 137 on 7/25 8. HTN: Monitor BP bid. On amlodipine and atenolol.  9. Ureteral stricture: Currently voiding without difficulty Monitor I/O.  No I/O Cath, per Urology due to potential surgery and risk for further injury.  10. Ankle fracture: Xray reviewed on 7/25 with no healing of fracture.  Per Ortho, CAM boot may be removed, will confirm with Ortho. 11. Depression/Anxiety: On cymbalta 12. GERD: On protonix.  13. Asthma: Encourage IS. On dulera for hyperactive airway.   14. Constipation: Increased miralax to bid. 15. CAD: Cont meds 16. Obesity: Body mass index is 33.75 kg/(m^2) on admission, diet and exercise education, encourage weight loss to increase endurance and promote overall health 17. Right ankle fracture: Cont boot 18. Chronic back pain: With severe central canal stenosis and foraminal narrowing: Cont meds 19. Right RTC tear and DJD right shoulder: Cont meds 20. Cognitive deficits with difficulty processing: Likely secondary to narcotics +/- infection. 21. Leukocytosis  Wound cx with Pseudomonas and Klebseilla.on Levaquin  WBCs 11.2 on 7/25 (improving)  Abx per ID  UA neg, Ucx multiple species 22. ABLA  Hb 10.9 on 7/25  Cont to monitor 13. Confusion: Improving per wife  Likely secondary to infection +/- medications  Will cont to monitor, medications also reviewed, pain medications adjusted, started on seroquel qHS.  LOS (Days) 14 A FACE TO FACE EVALUATION WAS PERFORMED  Micole Delehanty [name] 11/12/2015 10:17 AM

## 2015-11-12 NOTE — Progress Notes (Signed)
Occupational Therapy Session Note  Patient Details  Name: Mitchell [name] MRN: [id number] Date of Birth: [birth year]  Today's Date: 11/12/2015 OT Individual Time: 1333-1405 OT Individual Time Calculation (min): 32 min     Short Term Goals: Week 2:  OT Short Term Goal 1 (Week 2): Pt will sit to stand with min assist with RW to prepare for LB dressing  OT Short Term Goal 2 (Week 2): Pt will don pants over feet with reacher with min assist  OT Short Term Goal 3 (Week 2): Pt will bathe LB with LH sponge with mod assist  OT Short Term Goal 4 (Week 2): Pt will transfer to [facility] using scoot transfer with min assist   Skilled Therapeutic Interventions/Progress Updates:    Treatment session with focus on recall of back precautions, dynamic standing balance, and cognitive challenge.  Pt received upright in w/c with pt's daughter present and asking questions regarding reapplication of CAM boot - notified PA of pt family questions.  Engaged in stand step transfers with RW and min assist from elevated surfaces.  Pt unable to recall any of his back precautions despite question cues.  Educated on back precautions and carryover into functional context.  Engaged in pipe tree activity in standing with focus on sequencing and problem solving with pt requiring min question cues and increased time.  Maintained standing approx 4-5 mins with min guard - supervision, educated on energy conservation strategies and sitting as needed.  Pt and pt's daughter report understanding.  Therapy Documentation Precautions:  Precautions Precautions: Back Precaution Comments: unable to recall 0/3 back precautions.  Required Braces or Orthoses: Spinal Brace Spinal Brace: Applied in sitting position Restrictions Weight Bearing Restrictions: Yes RLE Weight Bearing: Weight bearing as tolerated LLE Weight Bearing: Weight bearing as tolerated Other Position/Activity Restrictions: CAM boot for RLE due to ankle fx, shoe for left  foot General:   Vital Signs: Therapy Vitals Temp: 98.2 F (36.8 C) Temp Source: Oral Pulse Rate: 80 Resp: 16 BP: (!) 99/56 Patient Position (if appropriate): Sitting Oxygen Therapy SpO2: 97 % O2 Device: Not Delivered Pain: Pain Assessment Pain Assessment: 0-10 Pain Score: 4  Pain Type: Acute pain Pain Location: Back Pain Orientation: Lower;Mid Pain Descriptors / Indicators: Aching Pain Frequency: Intermittent Pain Onset: On-going Patients Stated Pain Goal: 2 Pain Intervention(s): RN made aware;Ambulation/increased activity;Distraction Multiple Pain Sites: No  See Function Navigator for Current Functional Status.   Therapy/Group: Individual Therapy  Simonne Come 11/12/2015, 2:49 PM

## 2015-11-12 NOTE — Progress Notes (Signed)
Physical Therapy Session Note  Patient Details  Name: DAMIYON COGAR MRN: [id number] Date of Birth: Nov 02, 1934  Today's Date: 11/12/2015 PT Individual Time: [id number] PT Individual Time Calculation (min): 70 min    Short Term Goals: Week 2:  PT Short Term Goal 1 (Week 2): Patient will perform sit<>stand and stand pivot with min A and max cues with RW.  PT Short Term Goal 2 (Week 2): Patient will ambulate 83ft with min A and RW.  PT Short Term Goal 3 (Week 2): Patient will perform bed mobility with min A and use of bed features.  PT Short Term Goal 4 (Week 2): Patient will perform WC mobility consistently with supervision Afor >154ft  PT Short Term Goal 5 (Week 2): Patient will initiate stair training.  Week 3:     Skilled Therapeutic Interventions/Progress Updates:    Patient received sitting in [facility] with daughter present. PT educated pt and daughter on NWB status in RLE due to MD order following review of ankle imaging.   PT instructed patient in level SB transferx 2 and unlevel SB transfer x2 to and from bed with min A from PT and mod- max cues for proper NWB through RLE and UE placement to improve success and ease of transfer. Cues also provided for improved anterior weight shift to prevent sliding off SB.   PT instructed patient in [place] mobility for 116ft, 187ft, 144ft with supervision A. Mod cues for improved use of BUE in turns and to avoid obstacles in hall.   BLE seated therex with level 3 tband  LAQ x 10 HS curl x 12  Hip abduction x 12  Mod verbal cues for improved ROM, decreased speed of movement and reduced compensation from trunk.    Patient left sitting in [facility] with wife present and call bell in reach.     Therapy Documentation Precautions:  Precautions Precautions: Back Precaution Comments: unable to recall 0/3 back precautions.  Required Braces or Orthoses: Spinal Brace Spinal Brace: Applied in sitting position Restrictions Weight Bearing Restrictions: Yes RLE  Weight Bearing: NWB per MD, waiting on consult from Orthopedist.  LLE Weight Bearing: Weight bearing as tolerated Other Position/Activity Restrictions: CAM boot for RLE due to ankle fx, shoe for left foot  See Function Navigator for Current Functional Status.   Therapy/Group: Individual Therapy  [name] 11/12/2015, 6:33 PM

## 2015-11-12 NOTE — Progress Notes (Signed)
Physical Therapy Session Note  Patient Details  Name: Mitchell [name] MRN: [id number] Date of Birth: 02-Sep-1934  Today's Date: 11/12/2015 PT Individual Time: [id number] PT Individual Time Calculation (min): 77 min    Short Term Goals: Week 2:  PT Short Term Goal 1 (Week 2): Patient will perform sit<>stand and stand pivot with min A and max cues with RW.  PT Short Term Goal 2 (Week 2): Patient will ambulate 4ft with min A and RW.  PT Short Term Goal 3 (Week 2): Patient will perform bed mobility with min A and use of bed features.  PT Short Term Goal 4 (Week 2): Patient will perform WC mobility consistently with supervision Afor >146ft  PT Short Term Goal 5 (Week 2): Patient will initiate stair training.   Skilled Therapeutic Interventions/Progress Updates:    Pt received in w/c & agreeable to PT, noting 4/10 back pain & RN notified. Pt with back brace donned, and per orders given yesterday by MD pt does not have to wear CAM boot but had shoe & foot-up brace donned. Gait training completed in hallway with RW & min A for 50 ft + 50 ft + 50 ft. Pt with significant hip & knee flexion to advance RLE but with cues able to progress to more natural gait pattern. Pt with occasional inability to clear RLE and required min A overall for balance. During gait training pt reported increasing pain in R ankle & sitting breaks were initiated at those times. In gym pt performed sit<>Stand transfers from elevated surface without BUE for BLE strengthening with cuing for anterior weight shifts. Pt only able to complete 3 transfers with rest breaks between each 2/2 fatigue. Balance training completed with pt holding onto RW with 1UE support and reaching outside of BOS. Pt continues to demonstrate posterior lean even with maximum multimodal cuing for anterior weight shifts and to lean forwards. Pt reported significant fatigue overall 2/2 multiple consecutive therapy sessions. At end of session PA informed this therapist &  pt that he has to return to wearing R cam boot as ankle fracture has not healed. PT donned R cam boot total A. At end of session pt left sitting in w/c with QRB in place & all needs within reach.   During session pt alert & oriented to self, place, day of week & month but when asked why he was in the hospital he stated "because of my back" but could not provide details and noted the year to be 2023 & 1917. PT reoriented pt throughout session.  Therapy Documentation Precautions:  Precautions Precautions: Back Precaution Comments: unable to recall 0/3 back precautions.  Required Braces or Orthoses: Spinal Brace Spinal Brace: Applied in sitting position Restrictions Weight Bearing Restrictions: Yes RLE Weight Bearing: Weight bearing as tolerated LLE Weight Bearing: Weight bearing as tolerated Other Position/Activity Restrictions: CAM boot for RLE due to ankle fx, shoe for left foot  Pain: Pain Assessment Pain Assessment: 0-10 Pain Score: 4  Pain Location: Back Pain Intervention(s): RN made aware;Ambulation/increased activity;Distraction   See Function Navigator for Current Functional Status.   Therapy/Group: Individual Therapy  [name] 11/12/2015, 12:25 PM

## 2015-11-12 NOTE — Progress Notes (Signed)
Social Work Patient ID: Mitchell [name], male   DOB: 14-Jan-1935, 81 y.o.   MRN: [id number]   Met with pt and wife to discuss team conference and the need for ortho to give input regarding his ankle and cam boot. Both aware and are waiting word. Pt is clearer cognitively and is hoping with the other medication being discharge he will get better. Wife feels he is much better than he was and feels he is very sensitive to sedation and pain medications. Discussed the main issue will be if wife can provide the care he will require at discharge, she wants to try. Will await MD recommendation and work on a safe discharge plan.

## 2015-11-12 NOTE — Progress Notes (Signed)
Occupational Therapy Session Note  Patient Details  Name: Mitchell [name] MRN: [id number] Date of Birth: [birth year]  Today's Date: 11/12/2015 OT Individual Time: [id number] OT Individual Time Calculation (min): 55 min     Short Term Goals: Week 2:  OT Short Term Goal 1 (Week 2): Pt will sit to stand with min assist with RW to prepare for LB dressing  OT Short Term Goal 2 (Week 2): Pt will don pants over feet with reacher with min assist  OT Short Term Goal 3 (Week 2): Pt will bathe LB with LH sponge with mod assist  OT Short Term Goal 4 (Week 2): Pt will transfer to [facility] using scoot transfer with min assist   Skilled Therapeutic Interventions/Progress Updates:    Pt seen for skilled OT to facilitate ADL skills, functional mobility, balance. Pt participated extremely well today, only needing a few cues to manage with AE. Pt's cognition has improved a great deal and pt is able to recall things he has been learning. Pt ambulated to toilet with RW with +2 for safety due to high slope in bathroom door ledge.  He is able to pull underwear up and cleanse self about 50% of the way.  He used reacher well to don pants over feet.  Pt continues to have a posterior lean in standing when using BUE to pull pants up, needs cuing to put more wt over toes or to use one hand at a time. Pt resting in w/c at end of session with quick release belt on. Call light in reach.   Therapy Documentation Precautions:  Precautions Precautions: Back Precaution Comments: unable to recall 0/3 back precautions.  Required Braces or Orthoses: Spinal Brace Spinal Brace: Applied in sitting position Restrictions Weight Bearing Restrictions: Yes RLE Weight Bearing: Weight bearing as tolerated LLE Weight Bearing: Weight bearing as tolerated Other Position/Activity Restrictions: CAM boot for RLE due to ankle fx, shoe for left foot  Pain: Pain Assessment Pain Assessment: 0-10 Pain Score: 5  Pain Type: Acute pain Pain  Location: Back Pain Orientation: Lower;Mid Pain Descriptors / Indicators: Aching Pain Frequency: Intermittent Pain Onset: On-going Patients Stated Pain Goal: 2 Pain Intervention(s): Medication (See eMAR) Multiple Pain Sites: No ADL: ADL ADL Comments: Refer to functional navigator  See Function Navigator for Current Functional Status.   Therapy/Group: Individual Therapy  [place] 11/12/2015, 10:56 AM

## 2015-11-13 ENCOUNTER — Inpatient Hospital Stay (HOSPITAL_COMMUNITY): Payer: Medicare Other | Admitting: Physical Therapy

## 2015-11-13 ENCOUNTER — Inpatient Hospital Stay (HOSPITAL_COMMUNITY): Payer: Medicare Other | Admitting: Occupational Therapy

## 2015-11-13 DIAGNOSIS — N3944 Nocturnal enuresis: Secondary | ICD-10-CM

## 2015-11-13 MED ORDER — ACETAMINOPHEN 325 MG PO TABS: 650.0000 mg | ORAL_TABLET | Freq: Three times a day (TID) | ORAL | Status: DC | PRN

## 2015-11-13 MED ORDER — BUDESONIDE-FORMOTEROL FUMARATE 160-4.5 MCG/ACT IN AERO: 2.0000 | INHALATION_SPRAY | Freq: Two times a day (BID) | RESPIRATORY_TRACT | 3 refills | Status: DC

## 2015-11-13 NOTE — Plan of Care (Signed)
Problem: SCI BLADDER ELIMINATION Goal: RH STG MANAGE BLADDER WITH ASSISTANCE STG Manage Bladder With mod I Assistance  Outcome: Not Progressing incont at HS, urgency/frequency = condom at [facility]

## 2015-11-13 NOTE — Consult Note (Signed)
[place] Nurse wound consult note Reason for Consult:  Called by neurosurgery to help address spinal surgery surgical wound that is draining.  Reported by PA to be draining large amounts of serous fluid.  Reported by PA to be pinhole opening that is draining.  [place] nurse suggested incisional NPWT. PA in agreement with this plan. Wound type:surgical  Measurement: 19cm x 0.1cm x 0.2cm  Noted to have 4 openings along the incision, one at the distal end, one at the most proximal end and two along the incision. These areas are filled with yellow slough.  I did not probe these areas with a sterile qtip today.  PA said they have not probed the areas since they are "not open enough" Wound bed: see above Drainage (amount, consistency, odor) serosanguinous, mostly serous, dressings are saturated when I removed the dressings (ABD pads), the bedside nurse report changing 3x day or more.  Periwound: mildly macerated just at the wound edges.  Dressing procedure/placement/frequency: Lined incision with silicone non-adherent to protect the skin. 1pc of black foam cut in strip form and placed over the Mepitel making sure that the foam did not touch the skin. Sealed and connected to NPWT VAC at 171mmHG. Patient tolerated without problems, secured tubing to the patients left flank in order for this patient to wear his brace for therapy. Discussed placement with therapist and bedside nurse and charge nurse. This is incisional NPWT, it will not need to be changed as frequently as traditional NPWT.[name] Dressings will need to be changed every 5 days.  Orders were written for this frequency as well as orders to outline dressing change if needed between   [facility] team will follow along with you as needed for incisional NPWT Shalah Estelle Lynita [place], [employer]

## 2015-11-13 NOTE — Plan of Care (Signed)
Problem: RH KNOWLEDGE DEFICIT SCI Goal: RH STG INCREASE KNOWLEDGE OF SELF CARE AFTER SCI PT and wife will be able to state and demo care of the incision and complete dressing changes with instructions, reminders. cues

## 2015-11-13 NOTE — Progress Notes (Signed)
Physical Therapy Session Note  Patient Details  Name: TEDFORD BERG MRN: [id number] Date of Birth: 1934-11-04  Today's Date: 11/13/2015 PT Individual Time: 0900-0958 PT Individual Time Calculation (min): 58 min    Short Term Goals: Week 1:  PT Short Term Goal 1 (Week 1): Patient will consistent perform stand pivot transfer and sit<>stand with Mod A and RW.  PT Short Term Goal 1 - Progress (Week 1): Partly met PT Short Term Goal 2 (Week 1): Patient will performed bed mobility with min A.  PT Short Term Goal 2 - Progress (Week 1): Progressing toward goal PT Short Term Goal 3 (Week 1): Patient will  ambulate 57f with Mod A from PT with RW  PT Short Term Goal 3 - Progress (Week 1): Met PT Short Term Goal 4 (Week 1): Patient will perform car transfer with max A and RW.  PT Short Term Goal 4 - Progress (Week 1): Met Week 2:  PT Short Term Goal 1 (Week 2): Patient will perform sit<>stand and stand pivot with min A and max cues with RW.  PT Short Term Goal 2 (Week 2): Patient will ambulate 543fwith min A and RW.  PT Short Term Goal 3 (Week 2): Patient will perform bed mobility with min A and use of bed features.  PT Short Term Goal 4 (Week 2): Patient will perform WC mobility consistently with supervision Afor >15070fPT Short Term Goal 5 (Week 2): Patient will initiate stair training.   Skilled Therapeutic Interventions/Progress Updates:    Patient received supine in bed and agreeable to PT.  Patient instructed in supine>sit though log roll to L with min A from PT. Patient demonstrated improved sidelying to sit from L with use of L UE compared to R SL to sit.   Patient instructed in LB dressing with mod A from PT sitting EOB with lateral lean technique R and L x 3 each direction.   PT assist patient in SB [facility]  As well as mod cues for proper UE postitioning and minimized use of R LE to maintain temporary NWB status.   WC mobility in hall for [id number]  supervision A and multimodal cues for improved power with each push from BUE to improve effectiveness of propulsion.   PT instructed patient in SB transfer to nustep with min A for safety and decreased use of RLE.  NUstep endurance training x 10 minutes with 2 min on level3 and 8 minutes on level 6 with one rest break.   Patient returned to room in WC. [facility] transfer to bed with min A from PT and multimodal cues for proper UE placement.  Sit<>supine through log roll with mod A to manage BLE. Patient required mod cues to prevent lumbar rotation and proper technique for sit to side lying including improved LE control.   Patient left supine in bed with call bell in reach and wife present.      Therapy Documentation Precautions:  Precautions Precautions: Back Precaution Comments: unable to recall 0/3 back precautions.  Required Braces or Orthoses: Spinal Brace Spinal Brace: Applied in sitting position Restrictions Weight Bearing Restrictions: Yes RLE Weight Bearing: Non weight bearing LLE Weight Bearing: Weight bearing as tolerated Other Position/Activity Restrictions: CAM boot for RLE due to ankle fx, shoe for left foot   Pain: Pain Assessment Pain Score: 0-10 3/10    See Function Navigator for Current Functional Status.   Therapy/Group: Individual Therapy  AustLinus Salmons  Berline Lopes 11/13/2015, 9:58 AM

## 2015-11-13 NOTE — Progress Notes (Signed)
Physical Therapy Session Note  Patient Details  Name: Mitchell [name] MRN: [id number] Date of Birth: May 13, 1934  Today's Date: 11/13/2015 PT Individual Time: [id number] PT Individual Time Calculation (min): 73 min    Short Term Goals: Week 2:  PT Short Term Goal 1 (Week 2): Patient will perform sit<>stand and stand pivot with min A and max cues with RW.  PT Short Term Goal 2 (Week 2): Patient will ambulate 108ft with min A and RW.  PT Short Term Goal 3 (Week 2): Patient will perform bed mobility with min A and use of bed features.  PT Short Term Goal 4 (Week 2): Patient will perform WC mobility consistently with supervision Afor >114ft  PT Short Term Goal 5 (Week 2): Patient will initiate stair training.    Skilled Therapeutic Interventions/Progress Updates:   Pt received seated in bed after completing lunch; denies pain and agreeable to treatment. Rolling R/L to don lumbar orthosis with S and bedrails. Pt unable to verbalize back precautions until cued to look for sign in room to A with recall. From flat bed, pt performs log roll supine>sit with modA. Lateral scoot with transfer board bed>w/c with bed elevated to height of w/c. Transported to gym totalA for energy conservation. Mat table elevated to 25" per wife's report of bed height at home in order to practice uphill transfer with transfer board and NWB RLE. Requires mod question cues for w/c setup at side of mat table, and total cues to identify that armrest needed to be removed in order to use transfer board. ModA transfer uphill to mat. Sit >supine modA to A with BLEs onto mat table. Supine LE exercises including heel slides, short arc quad, hip adduction isometric with ball. All performed 2 sets 15 reps with mod cues for attention to reps/sets and slower performance for increased control. Supine>sit with logroll and minA for LEs off EOB. Transfer downhill from mat>w/c with min guard, assist to place board. W/c propulsion x150' with BUE for  strengthening and aerobic endurance; min cues for navigation to prevent getting too close to doorways. Remained seated in w/c at end of session, all needs in reach.   Therapy Documentation Precautions:  Precautions Precautions: Back Precaution Comments: unable to recall 0/3 back precautions.  Required Braces or Orthoses: Spinal Brace Spinal Brace: Applied in sitting position Restrictions Weight Bearing Restrictions: Yes RLE Weight Bearing: Non weight bearing LLE Weight Bearing: Weight bearing as tolerated Other Position/Activity Restrictions: CAM boot for RLE due to ankle fx, shoe for left foot Pain: Pain Assessment Pain Assessment: No/denies pain Pain Score: 2  Pain Location: Back Pain Descriptors / Indicators: Aching Pain Onset: On-going Patients Stated Pain Goal: 3   See Function Navigator for Current Functional Status.   Therapy/Group: Individual Therapy  Luberta Mutter 11/13/2015, 3:40 PM

## 2015-11-13 NOTE — Progress Notes (Signed)
Occupational Therapy Session Note  Patient Details  Name: Mitchell [name] MRN: [id number] Date of Birth: 1934-06-27  Today's Date: 11/13/2015 OT Individual Time: 3244-0102 OT Individual Time Calculation (min): 90 min     Short Term Goals:Week 1:  OT Short Term Goal 1 (Week 1): Pt will sit to stand with min-mod A with RW to prepare for LB dressing. OT Short Term Goal 1 - Progress (Week 1): Partly met (pt fluctuates, mod assist at times, +2 at times) OT Short Term Goal 2 (Week 1): Pt will don pants over feet with reacher with min A. OT Short Term Goal 2 - Progress (Week 1): Progressing toward goal OT Short Term Goal 3 (Week 1): Pt will bathe LB with long sponge with mod A. OT Short Term Goal 3 - Progress (Week 1): Progressing toward goal OT Short Term Goal 4 (Week 1): Pt will transfer to [facility] using scoot transfer with min A. OT Short Term Goal 4 - Progress (Week 1): Not met Week 2:  OT Short Term Goal 1 (Week 2): Pt will sit to stand with min assist with RW to prepare for LB dressing  OT Short Term Goal 2 (Week 2): Pt will don pants over feet with reacher with min assist  OT Short Term Goal 3 (Week 2): Pt will bathe LB with LH sponge with mod assist  OT Short Term Goal 4 (Week 2): Pt will transfer to [place] using scoot transfer with min assist       Skilled Therapeutic Interventions/Progress Updates:    Pt seen for skilled OT to facilitate sliding board transfers to [facility] as pt is now NWB on RLE. Pt received in bed after just having wound vac administered to his back incision. Pt agreeable to working on [facility] transfers with board.  Initially asked pt to try first transfer bed to drop arm BSC  (wide, Heavy duty) with a scoot transfer as pt had done that well when he was first admitted to rehab. Pt was able to do it with one helper at foot and one helper assisting with hip lift. Pt felt it was fatiguing and wanted to use slide board instead. He used board back to bed with min A. Practiced a 2nd time to  demonstrate to pt and spouse how to use a bed pad over board if pt did not have clothing on in bed. Pt then needed to toilet but he wanted to use the regular toilet. BSC placed over toilet. Pt used board with +2 A to manage R leg and board. Pt did well with actual transfer but clothing management much more difficult. Pt worked on pushing up from [facility] as therapist pulled pants down. He needed total A to cleanse. To pull pants up, it was too difficult to get over thighs. Pt worked on standing with +2 A to maintain NWB on R to get pants over hips.   Pt very fatigued by this point and requested to get into bed.  Post transfer, pt began crying, looked pale and felt clammy. HOB lowered.  Blood pressure taken in supine.  BP WNL by that point. Pt was calm and stated he just began feeling very tired and began having more pain.  Pt was relaxed resting in bed at end of session. Bed alarm set, call light in reach.  Therapy Documentation Precautions:  Precautions Precautions: Back Precaution Comments: unable to recall 0/3 back precautions.  Required Braces or Orthoses: Spinal Brace Spinal Brace: Applied in sitting position Restrictions  Weight Bearing Restrictions: Yes RLE Weight Bearing: Non weight bearing LLE Weight Bearing: Weight bearing as tolerated Other Position/Activity Restrictions: CAM boot for RLE due to ankle fx, shoe for left foot    Vital Signs: Therapy Vitals Pulse Rate: 72 BP: 126/74 Patient Position (if appropriate): Lying Pain: Pain Assessment Pain Assessment: 0-10 Pain Score: 4  Pain Location: Back Pain Descriptors / Indicators: Aching Pain Onset: On-going Patients Stated Pain Goal: 3 ADL: ADL ADL Comments: Refer to functional navigator  See Function Navigator for Current Functional Status.   Therapy/Group: Individual Therapy  Weldon Nouri 11/13/2015, 1:07 PM

## 2015-11-13 NOTE — Progress Notes (Signed)
Subjective: Patient reports "Thank you for stopping by"  Objective: Vital signs in last 24 hours: Temp:  [97.8 F (36.6 C)-98.2 F (36.8 C)] 97.8 F (36.6 C) (07/27 0551) Pulse Rate:  [65-80] 65 (07/27 0551) Resp:  [16] 16 (07/27 0551) BP: (99-121)/(56-65) 110/59 (07/27 0551) SpO2:  [97 %-98 %] 98 % (07/27 0551)  Intake/Output from previous day: 07/26 0701 - 07/27 0700 In: 240 [P.O.:240] Out: 950 [Urine:950] Intake/Output this shift: No intake/output data recorded.  Awakens to voice. No report of pain this morning. Wife reports decreased confusion over last few days. New setback: right ankle fx not healing. Now non-weightbearing awiting ortho eval. Lumbar incision continues to drain serous fluidm at steady rate. No erythema. Upper area of slough now 0.5cm across x 2cm length. Night-time incontinence managed with Depends.   Lab Results:  Recent Labs  11/11/15 0838  WBC 11.2*  HGB 10.9*  HCT 35.0*  PLT 510*   BMET  Recent Labs  11/11/15 0838 11/12/15 0729  NA 137  --   K 4.1  --   CL 102  --   CO2 27  --   GLUCOSE 149*  --   BUN 8  --   CREATININE 0.69 0.65  CALCIUM 8.7*  --     Studies/Results: Dg Ankle Complete Right  Result Date: 11/11/2015 CLINICAL DATA:  Followup of right ankle fracture. EXAM: RIGHT ANKLE - COMPLETE 3+ VIEW COMPARISON:  09/17/2015 FINDINGS: diffuse soft tissue swelling is increased. No significant healing across the previously described distal fibular fracture. Base of fifth metatarsal and talar dome intact. Vascular calcifications. Tibiotalar osteoarthritis. Possible remote medial malleolar avulsion fracture. IMPRESSION: No significant healing of distal fibular fracture since 09/17/2015. Increase in diffuse soft tissue swelling. Electronically Signed   By: Abigail Miyamoto M.D.   On: 11/11/2015 19:51   Assessment/Plan:   LOS: 15 days  Continue drsg changes as needed to keep wound dry as possible. Will consult wound care nurse for  recommendations (unsure debriding cream/gel will be effective with fairly constant drainage).  Informed pts wife that DrStern & I will be out of town for the next two weeks, but on-call MD is available for any NS issues that may arise.)   Mitchell [name] 11/13/2015, 7:36 AM

## 2015-11-13 NOTE — Progress Notes (Signed)
Clinically healed fracture without pain to palpation.  D/c boot.  May ambulate as tolerated.

## 2015-11-13 NOTE — Progress Notes (Signed)
[place] PHYSICAL MEDICINE & REHABILITATION     PROGRESS NOTE  Subjective/Complaints:  Pt laying in bed eating breakfast.  His cognition appears to be improving.    ROS: Denies CP, SOB, N/V/D.  Objective: Vital Signs: Blood pressure (!) 110/59, pulse 65, temperature 97.8 F (36.6 C), temperature source Oral, resp. rate 16, height 6\' 3"  (1.905 m), weight 124.8 kg (275 lb 1.6 oz), SpO2 98 %. Dg Ankle Complete Right  Result Date: 11/11/2015 CLINICAL DATA:  Followup of right ankle fracture. EXAM: RIGHT ANKLE - COMPLETE 3+ VIEW COMPARISON:  09/17/2015 FINDINGS: diffuse soft tissue swelling is increased. No significant healing across the previously described distal fibular fracture. Base of fifth metatarsal and talar dome intact. Vascular calcifications. Tibiotalar osteoarthritis. Possible remote medial malleolar avulsion fracture. IMPRESSION: No significant healing of distal fibular fracture since 09/17/2015. Increase in diffuse soft tissue swelling. Electronically Signed   By: Abigail Miyamoto M.D.   On: 11/11/2015 19:51   Recent Labs  11/11/15 0838  WBC 11.2*  HGB 10.9*  HCT 35.0*  PLT 510*    Recent Labs  11/11/15 0838 11/12/15 0729  NA 137  --   K 4.1  --   CL 102  --   GLUCOSE 149*  --   BUN 8  --   CREATININE 0.69 0.65  CALCIUM 8.7*  --    CBG (last 3)  No results for input(s): GLUCAP in the last 72 hours.  Wt Readings from Last 3 Encounters:  11/10/15 124.8 kg (275 lb 1.6 oz)  10/24/15 122.5 kg (270 lb)  10/16/15 122.5 kg (270 lb)    Physical Exam:  BP (!) 110/59 (BP Location: Left Arm)   Pulse 65   Temp 97.8 F (36.6 C) (Oral)   Resp 16   Ht 6\' 3"  (1.905 m)   Wt 124.8 kg (275 lb 1.6 oz)   SpO2 98%   BMI 34.39 kg/m  Constitutional: He appears well-developed and well-nourished.  HENT: Normocephalic and atraumatic.  Eyes: Conjunctivae are normal. Cardiovascular: Normal rate and regular rhythm.  Respiratory: Effort normal and breath sounds normal. No  stridor. No respiratory distress. He has no wheezes.  GI: Soft. Bowel sounds are normal. He exhibits no distension. There is no tenderness.  Musculoskeletal: He exhibits edema. He exhibits no tenderness.  Neurological: He is alert and oriented x3. HOH Motor: B/l UE: 4+/5 grossly proximal to distal RLE: hip flexion, knee extension 4+/5, ankle dorsi/plantarflexion 4-/5 LLE: hip flexion, knee extension 4+/5, ankle dorsi/plantar flexion 4+/5  Skin: Skin is warm and dry. No erythema. Back incision with dressing, improving drainage.  Psychiatric: He has a normal mood and affect. Thought content normal.    Assessment/Plan: 1. Functional deficits secondary to lumbar radiculopathy L2/3 and kyphosis with L4/5 spondylolisthesis with hx of right foot drop which require 3+ hours per day of interdisciplinary therapy in a comprehensive inpatient rehab setting. Physiatrist is providing close team supervision and 24 hour management of active medical problems listed below. Physiatrist and rehab team continue to assess barriers to discharge/monitor patient progress toward functional and medical goals.  Function:  Bathing Bathing position   Position: Sitting EOB  Bathing parts Body parts bathed by patient: Right arm, Left arm, Chest, Abdomen, Front perineal area, Right upper leg, Left upper leg, Right lower leg, Left lower leg, Buttocks Body parts bathed by helper: Back  Bathing assist Assist Level: Touching or steadying assistance(Pt > 75%), 2 helpers      Upper Body Dressing/Undressing Upper body dressing  What is the patient wearing?: Pull over shirt/dress, Orthosis     Pull over shirt/dress - Perfomed by patient: Thread/unthread right sleeve, Thread/unthread left sleeve, Put head through opening, Pull shirt over trunk Pull over shirt/dress - Perfomed by helper: Pull shirt over trunk Button up shirt - Perfomed by patient: Thread/unthread right sleeve, Thread/unthread left sleeve, Button/unbutton  shirt Button up shirt - Perfomed by helper: Pull shirt around back Orthosis activity level: Performed by helper  Upper body assist Assist Level: Set up   Set up : To apply TLSO, cervical collar  Lower Body Dressing/Undressing Lower body dressing   What is the patient wearing?: Pants, Underwear, [employer], Shoes Underwear - Performed by patient: Thread/unthread right underwear leg, Thread/unthread left underwear leg Underwear - Performed by helper: Pull underwear up/down Pants- Performed by patient: Thread/unthread right pants leg, Thread/unthread left pants leg, Pull pants up/down Pants- Performed by helper: Pull pants up/down, Thread/unthread right pants leg, Thread/unthread left pants leg           Shoes - Performed by helper: Don/doff right shoe, Don/doff left shoe, Fasten right, Fasten left       TED Hose - Performed by helper: Don/doff right TED hose, Don/doff left TED hose  Lower body assist Assist for lower body dressing: 2 Automotive engineer activity did not occur: N/A   Toileting steps completed by helper: Adjust clothing prior to toileting, Performs perineal hygiene, Adjust clothing after toileting Toileting Assistive Devices: Grab bar or rail  Toileting assist Assist level: Two helpers   Transfers Chair/bed transfer   Chair/bed transfer method: Lateral scoot Chair/bed transfer assist level: Touching or steadying assistance (Pt > 75%) Chair/bed transfer assistive device: Medical sales representative     Max distance: 50 ft Assist level: Touching or steadying assistance (Pt > 75%)   Wheelchair   Type: Manual Max wheelchair distance: 170 Assist Level: Supervision or verbal cues  Cognition Comprehension Comprehension assist level: Understands basic 75 - 89% of the time/ requires cueing 10 - 24% of the time  Expression Expression assist level: Expresses basic 75 - 89% of the time/requires cueing 10 - 24% of the time. Needs helper to  occlude trach/needs to repeat words.  Social Interaction Social Interaction assist level: Interacts appropriately 90% of the time - Needs monitoring or encouragement for participation or interaction.  Problem Solving Problem solving assist level: Solves basic 50 - 74% of the time/requires cueing 25 - 49% of the time  Memory Memory assist level: Recognizes or recalls 50 - 74% of the time/requires cueing 25 - 49% of the time     Medical Problem List and Plan: 1. Gait and cognitive deficits with weakness secondary to lumbar radiculopathy L2/3 and kyphosis with L4/5 spondylolisthesis with hx of right foot drop  Cont CIR 2. DVT Prophylaxis/Anticoagulation: Pharmaceutical: Lovenox added as 5 days post op.  3. Pain Management:  Ultram qid for now, d/ced on 7/19.   Increased tylenol on 7/24 4. Mood: LCSW to follow for evaluation and support.  5. Neuropsych: This patient is not capable of making decisions on his own behalf.  Klonipin d/ced  Seroquel 12.5 qhs d/ced on 7/26  Trazodone 25mg  PRN, medication sensitive  CT head from 7/20 reviewed, no acute changes, small vessel disease  Ammonia normal 6. Skin/Wound Care: Monitor wound for healing. Maintain adequate nutrition and hydration status.   Surgical wound infection. Appreciate Neurosurg and ID recs. (improving)   IV Abx, now  on PO Levoquin.   7. Fluids/Electrolytes/Nutrition: Monitor I/O.   Hyponatremia: Resolved 137 on 7/25 8. HTN: Monitor BP bid. On amlodipine and atenolol.  9. Ureteral stricture: Currently voiding without difficulty Monitor I/O.  No I/O Cath, per Urology due to potential surgery and risk for further injury.  10. Ankle fracture: Xray reviewed on 7/25 with no healing of fracture.  Per Ortho, CAM boot may be removed, will confirm with Ortho, awaiting call back. 11. Depression/Anxiety: On cymbalta 12. GERD: On protonix.  13. Asthma: Encourage IS. On dulera for hyperactive airway.  14. Constipation:  Increased miralax to bid. 15. CAD: Cont meds 16. Obesity: Body mass index is 33.75 kg/(m^2) on admission, diet and exercise education, encourage weight loss to increase endurance and promote overall health 17. Right ankle fracture: Cont boot 18. Chronic back pain: With severe central canal stenosis and foraminal narrowing: Cont meds 19. Right RTC tear and DJD right shoulder: Cont meds 20. Cognitive deficits with difficulty processing: Likely secondary to narcotics +/- infection. 21. Leukocytosis  Wound cx with Pseudomonas and Klebsiella on Levaquin  WBCs 11.2 on 7/25 (improving)  Abx per ID  UA neg, Ucx multiple species 22. ABLA  Hb 10.9 on 7/25  Cont to monitor 13. Confusion: Improving  Likely secondary to infection +/- medications  Will cont to monitor, medications also reviewed, pain and sleep medications adjusted. 14. Nocturnal urinary incontinence  Will place condom cath to avoid nighttime awakenings and improve sleep  LOS (Days) 15 A FACE TO FACE EVALUATION WAS PERFORMED  Abdulloh Ullom [name] 11/13/2015 9:53 AM

## 2015-11-14 ENCOUNTER — Inpatient Hospital Stay (HOSPITAL_COMMUNITY): Payer: Medicare Other | Admitting: Physical Therapy

## 2015-11-14 ENCOUNTER — Inpatient Hospital Stay (HOSPITAL_COMMUNITY): Payer: Medicare Other | Admitting: Occupational Therapy

## 2015-11-14 DIAGNOSIS — L24A9 Irritant contact dermatitis due friction or contact with other specified body fluids: Secondary | ICD-10-CM | POA: Insufficient documentation

## 2015-11-14 DIAGNOSIS — T148XXA Other injury of unspecified body region, initial encounter: Secondary | ICD-10-CM | POA: Insufficient documentation

## 2015-11-14 LAB — BASIC METABOLIC PANEL
Anion gap: 9 (ref 5–15)
BUN: 8 mg/dL (ref 6–20)
CO2: 22 mmol/L (ref 22–32)
Calcium: 8.4 mg/dL — ABNORMAL LOW (ref 8.9–10.3)
Chloride: 105 mmol/L (ref 101–111)
Creatinine, Ser: 0.55 mg/dL — ABNORMAL LOW (ref 0.61–1.24)
GFR calc Af Amer: >60 mL/min (ref >60)
GFR calc non Af Amer: >60 mL/min (ref >60)
Glucose, Bld: 104 mg/dL — ABNORMAL HIGH (ref 65–99)
Potassium: 4.2 mmol/L (ref 3.5–5.1)
Sodium: 136 mmol/L (ref 135–145)

## 2015-11-14 LAB — CBC WITH DIFFERENTIAL/PLATELET
Basophils Absolute: 0 10*3/uL (ref 0.0–0.1)
Basophils Relative: 0 %
Eosinophils Absolute: 0.1 10*3/uL (ref 0.0–0.7)
Eosinophils Relative: 2 %
HCT: 33 % — ABNORMAL LOW (ref 39.0–52.0)
Hemoglobin: 10.2 g/dL — ABNORMAL LOW (ref 13.0–17.0)
Lymphocytes Relative: 25 %
Lymphs Abs: 2.1 10*3/uL (ref 0.7–4.0)
MCH: 29.9 pg (ref 26.0–34.0)
MCHC: 30.9 g/dL (ref 30.0–36.0)
MCV: 96.8 fL (ref 78.0–100.0)
Monocytes Absolute: 0.6 10*3/uL (ref 0.1–1.0)
Monocytes Relative: 8 %
Neutro Abs: 5.4 10*3/uL (ref 1.7–7.7)
Neutrophils Relative %: 65 %
Platelets: 386 10*3/uL (ref 150–400)
RBC: 3.41 MIL/uL — ABNORMAL LOW (ref 4.22–5.81)
RDW: 14.5 % (ref 11.5–15.5)
WBC: 8.2 10*3/uL (ref 4.0–10.5)

## 2015-11-14 NOTE — Progress Notes (Signed)
Occupational Therapy Weekly Progress Note  Patient Details  Name: Mitchell [name] MRN: [id number] Date of Birth: [birth year]  Beginning of progress report period: November 07, 2015 End of progress report period: November 14, 2015   Patient has met 4 of 4 short term goals.  Pt is making excellent progress with his use of AE for dressing, following back precautions, functional mobility for transfers, and standing balance. His cognition is also improving well as he is becoming more oriented and day to day memory is improving.  Patient continues to demonstrate the following deficits: decreased memory, decreased standing balance without UE support with slight posterior lean, decreased activity tolerance with standing and ambulation and therefore will continue to benefit from skilled OT intervention to enhance overall performance with BADL.  Patient progressing toward long term goals..  Continue plan of care.  OT Short Term Goals Week 1:  OT Short Term Goal 1 (Week 1): Pt will sit to stand with min-mod A with RW to prepare for LB dressing. OT Short Term Goal 1 - Progress (Week 1): Partly met (pt fluctuates, mod assist at times, +2 at times) OT Short Term Goal 2 (Week 1): Pt will don pants over feet with reacher with min A. OT Short Term Goal 2 - Progress (Week 1): Progressing toward goal OT Short Term Goal 3 (Week 1): Pt will bathe LB with long sponge with mod A. OT Short Term Goal 3 - Progress (Week 1): Progressing toward goal OT Short Term Goal 4 (Week 1): Pt will transfer to [facility] using scoot transfer with min A. OT Short Term Goal 4 - Progress (Week 1): Not met Week 2:  OT Short Term Goal 1 (Week 2): Pt will sit to stand with min assist with RW to prepare for LB dressing  OT Short Term Goal 1 - Progress (Week 2): Met OT Short Term Goal 2 (Week 2): Pt will don pants over feet with reacher with min assist  OT Short Term Goal 2 - Progress (Week 2): Met OT Short Term Goal 3 (Week 2): Pt will bathe LB with  LH sponge with mod assist  OT Short Term Goal 3 - Progress (Week 2): Met OT Short Term Goal 4 (Week 2): Pt will transfer to [facility] using scoot transfer with min assist  OT Short Term Goal 4 - Progress (Week 2): Met Week 3:  OT Short Term Goal 1 (Week 3): STGs = LTGs        Therapy Documentation Precautions: Precautions Precautions: Back Precaution Comments: unable to recall 0/3 back precautions.  Required Braces or Orthoses: Spinal Brace Spinal Brace: Applied in sitting position Restrictions Weight Bearing Restrictions: Yes RLE Weight Bearing: Non weight bearing LLE Weight Bearing: Weight bearing as tolerated Other Position/Activity Restrictions: CAM boot for RLE due to ankle fx, shoe for left foot   Pain: Pain Assessment Pain Assessment: 0-10 Pain Score: 1  Pain Intervention(s): Rest;Relaxation ADL: ADL ADL Comments: Refer to functional navigator  See Function Navigator for Current Functional Status.   Therapy/Group: Individual Therapy  SAGUIER,JULIA 11/14/2015, 1:07 PM

## 2015-11-14 NOTE — Progress Notes (Signed)
[place] PHYSICAL MEDICINE & REHABILITATION     PROGRESS NOTE  Subjective/Complaints:  Pt laying in bed this AM.  He is more alert and oriented.  Wife notes pt slept better with condom cath at night.    ROS: Denies CP, SOB, N/V/D.  Objective: Vital Signs: Blood pressure 133/70, pulse 68, temperature 97.8 F (36.6 C), temperature source Oral, resp. rate 18, height 6\' 3"  (1.905 m), weight 124.8 kg (275 lb 1.6 oz), SpO2 95 %. No results found.  Recent Labs  11/14/15 0751  WBC 8.2  HGB 10.2*  HCT 33.0*  PLT 386    Recent Labs  11/12/15 0729 11/14/15 0751  NA  --  136  K  --  4.2  CL  --  105  GLUCOSE  --  104*  BUN  --  8  CREATININE 0.65 0.55*  CALCIUM  --  8.4*   CBG (last 3)  No results for input(s): GLUCAP in the last 72 hours.  Wt Readings from Last 3 Encounters:  11/10/15 124.8 kg (275 lb 1.6 oz)  10/24/15 122.5 kg (270 lb)  10/16/15 122.5 kg (270 lb)    Physical Exam:  BP 133/70 (BP Location: Right Arm)   Pulse 68   Temp 97.8 F (36.6 C) (Oral)   Resp 18   Ht 6\' 3"  (1.905 m)   Wt 124.8 kg (275 lb 1.6 oz)   SpO2 95%   BMI 34.39 kg/m  Constitutional: He appears well-developed and well-nourished.  HENT: Normocephalic and atraumatic.  Eyes: Conjunctivae are normal. Cardiovascular: Normal rate and regular rhythm.  Respiratory: Effort normal and breath sounds normal. No stridor. No respiratory distress. He has no wheezes.  GI: Soft. Bowel sounds are normal. He exhibits no distension. There is no tenderness.  Musculoskeletal: He exhibits edema. He exhibits no tenderness.  Neurological: He is alert and oriented x2. HOH Motor: B/l UE: 4+/5 grossly proximal to distal RLE: hip flexion, knee extension 4+/5, ankle dorsi/plantarflexion 4-/5 LLE: hip flexion, knee extension 4+/5, ankle dorsi/plantar flexion 4+/5  Skin: Skin is warm and dry. No erythema. Back incision with dressing, improving drainage, now with VAC.  Psychiatric: He has a normal mood and  affect. Thought content normal.    Assessment/Plan: 1. Functional deficits secondary to lumbar radiculopathy L2/3 and kyphosis with L4/5 spondylolisthesis with hx of right foot drop which require 3+ hours per day of interdisciplinary therapy in a comprehensive inpatient rehab setting. Physiatrist is providing close team supervision and 24 hour management of active medical problems listed below. Physiatrist and rehab team continue to assess barriers to discharge/monitor patient progress toward functional and medical goals.  Function:  Bathing Bathing position   Position: Sitting EOB  Bathing parts Body parts bathed by patient: Right arm, Left arm, Chest, Abdomen, Front perineal area, Right upper leg, Left upper leg, Right lower leg, Left lower leg, Buttocks Body parts bathed by helper: Back  Bathing assist Assist Level: Touching or steadying assistance(Pt > 75%), 2 helpers      Upper Body Dressing/Undressing Upper body dressing   What is the patient wearing?: Pull over shirt/dress, Orthosis     Pull over shirt/dress - Perfomed by patient: Thread/unthread right sleeve, Thread/unthread left sleeve, Put head through opening, Pull shirt over trunk Pull over shirt/dress - Perfomed by helper: Pull shirt over trunk Button up shirt - Perfomed by patient: Thread/unthread right sleeve, Thread/unthread left sleeve, Button/unbutton shirt Button up shirt - Perfomed by helper: Pull shirt around back Orthosis activity level: Performed by  helper  Upper body assist Assist Level: Set up   Set up : To apply TLSO, cervical collar  Lower Body Dressing/Undressing Lower body dressing   What is the patient wearing?: Pants, Underwear, [employer], Shoes Underwear - Performed by patient: Thread/unthread right underwear leg, Thread/unthread left underwear leg Underwear - Performed by helper: Pull underwear up/down Pants- Performed by patient: Thread/unthread right pants leg, Thread/unthread left pants leg, Pull  pants up/down Pants- Performed by helper: Pull pants up/down, Thread/unthread right pants leg, Thread/unthread left pants leg           Shoes - Performed by helper: Don/doff right shoe, Don/doff left shoe, Fasten right, Fasten left       TED Hose - Performed by helper: Don/doff right TED hose, Don/doff left TED hose  Lower body assist Assist for lower body dressing: 2 Helpers      Toileting Toileting Toileting activity did not occur: N/A   Toileting steps completed by helper: Adjust clothing prior to toileting, Performs perineal hygiene, Adjust clothing after toileting Toileting Assistive Devices: Grab bar or rail  Toileting assist Assist level: Two helpers   Transfers Chair/bed transfer   Chair/bed transfer method: Lateral scoot Chair/bed transfer assist level: Moderate assist (Pt 50 - 74%/lift or lower) Chair/bed transfer assistive device: Sliding board     Locomotion Ambulation     Max distance: 50 ft Assist level: Touching or steadying assistance (Pt > 75%)   Wheelchair   Type: Manual Max wheelchair distance: 121ft Assist Level: Supervision or verbal cues  Cognition Comprehension Comprehension assist level: Understands basic 75 - 89% of the time/ requires cueing 10 - 24% of the time  Expression Expression assist level: Expresses basic 75 - 89% of the time/requires cueing 10 - 24% of the time. Needs helper to occlude trach/needs to repeat words.  Social Interaction Social Interaction assist level: Interacts appropriately 90% of the time - Needs monitoring or encouragement for participation or interaction.  Problem Solving Problem solving assist level: Solves basic 50 - 74% of the time/requires cueing 25 - 49% of the time  Memory Memory assist level: Recognizes or recalls 50 - 74% of the time/requires cueing 25 - 49% of the time     Medical Problem List and Plan: 1. Gait and cognitive deficits with weakness secondary to lumbar radiculopathy L2/3 and kyphosis with  L4/5 spondylolisthesis with hx of right foot drop  Cont CIR 2. DVT Prophylaxis/Anticoagulation: Pharmaceutical: Lovenox added as 5 days post op.  3. Pain Management:  Ultram qid for now, d/ced on 7/19.   Increased tylenol on 7/24 4. Mood: LCSW to follow for evaluation and support.  5. Neuropsych: This patient is not capable of making decisions on his own behalf.  Klonipin d/ced  Seroquel 12.5 qhs d/ced on 7/26  Trazodone 25mg  PRN, pt medication sensitive  CT head from 7/20 reviewed, no acute changes, small vessel disease  Ammonia normal 6. Skin/Wound Care: Monitor wound for healing. Maintain adequate nutrition and hydration status.   Surgical wound infection. Appreciate Neurosurg and ID recs. (improving)   IV Abx, now on PO Levoquin.    Now with VAC  7. Fluids/Electrolytes/Nutrition: Monitor I/O.   Hyponatremia: Resolved 136 on 7/28 8. HTN: Monitor BP bid. On amlodipine and atenolol.  9. Ureteral stricture: Currently voiding without difficulty Monitor I/O.  No I/O Cath, per Urology due to potential surgery and risk for further injury.  10. Ankle fracture: Xray reviewed on 7/25 with no healing of fracture.  Per Ortho, d/c CAM  boot, WBAT. 11. Depression/Anxiety: On cymbalta 12. GERD: On protonix.  13. Asthma: Encourage IS. On dulera for hyperactive airway.  14. Constipation: Increased miralax to bid. 15. CAD: Cont meds 16. Obesity: Body mass index is 33.75 kg/(m^2) on admission, diet and exercise education, encourage weight loss to increase endurance and promote overall health 17. Right ankle fracture: Cont boot 18. Chronic back pain: With severe central canal stenosis and foraminal narrowing: Cont meds 19. Right RTC tear and DJD right shoulder: Cont meds 20. Cognitive deficits with difficulty processing: Likely secondary to narcotics +/- infection. 21. Leukocytosis: Resolved  Wound cx with Pseudomonas and Klebsiella, Levaquin stopped 7/27  WBCs 8.2 on  7/28  Abx d/ced per ID  UA neg, Ucx multiple species 22. ABLA  Hb 10.2 on 7/28  Cont to monitor 13. Confusion: Improving  Likely secondary to infection +/- medications  Will cont to monitor, medications also reviewed, pain and sleep medications adjusted. 14. Nocturnal urinary incontinence  Cont condom cath to avoid nighttime awakenings and improve sleep  LOS (Days) 16 A FACE TO FACE EVALUATION WAS PERFORMED  Rutilio Yellowhair [name] 11/14/2015 9:25 AM

## 2015-11-14 NOTE — Progress Notes (Signed)
Wife refused pt to be stuck by IV team for labs this AM, "he's sleeping right now."  IV team rescheduled for 8am prior to start of therapy.

## 2015-11-14 NOTE — Progress Notes (Signed)
Physical Therapy Session Note  Patient Details  Name: Mitchell [name] MRN: [id number] Date of Birth: [birth year]  Today's Date: 11/14/2015 PT Individual Time:  -  [id number]- 1130 AND 1515-1559 Calculated individual PT time: 58 min AND 44 min       Short Term Goals: Week 2:  PT Short Term Goal 1 (Week 2): Patient will perform sit<>stand and stand pivot with min A and max cues with RW.  PT Short Term Goal 2 (Week 2): Patient will ambulate 11ft with min A and RW.  PT Short Term Goal 3 (Week 2): Patient will perform bed mobility with min A and use of bed features.  PT Short Term Goal 4 (Week 2): Patient will perform WC mobility consistently with supervision Afor >183ft  PT Short Term Goal 5 (Week 2): Patient will initiate stair training.  Week 3:     Skilled Therapeutic Interventions/Progress Updates:     Patient received sitting in WC and agreeable to PT.   WC mobility for 110ft with supervision A and min A for tight turns in rehab gym around equipment.   Gait training with foot up brace x 79ft with RW. Patient noted to have significant Pronation on RLE.  PT instructed patient in Gait training using PLS AFO with medial upright 54ft x 2. Patient noted to have no increased ankle pronation and improve foot clearance with AFO compared to foot up brace. Patient peformed gain on ramp Ramp for 16ft up and down with min A from PT with AFO and RW. Min cues required to imrpoved anterior weight shift with ascent PT instructed pt's wife in proper gaurding technique for gait using RW x 10 ft. Min cues for proper positioning behing patient and proper safety cues for improved gait pattern and for stand>sit.   Stair training with RLE AFO and BUE support on rails up 6 inch step (4) and down  3 inch steps (8). Mod A from PT with increasing cues for proper technique and foot placement with fatigue.   PT instructed patient in sit<>stand x 5 and x 4 with min and RW. Mod cues for decreased anchoring of LE on chair  and improved anterior weight shift.   Patient returned to room and left sitting in [facility] with call bell in reach.   Session 2:   Patient received supine in bed and agreeable to PT.  Patient performed supine>sit through log roll to L with min A from PT and min cues for safet and proper lE movement.  PT assisted patient to don AFO with mod A.  Patient transported to gym with total A for time management. Stand pivot to nustep with RW and min A from PT. Nustep endurance training with min cues to maintain SPM <45 to reduce excessive fatigue. Resistance kept at level 5 x 10 minutes, and patient need one rest break at 5.5 minutes.  Balance training with alternating foot taps on 4 inch step 2x 8 with BUE support on Rw  PT instructed in gait training for 50ft with RW and min A from PT. Min verbal cues for improved dhip stability and  Increased step length as possible. Patient continues to demonstrate good foot clearance on the LLE with AFO.   Patient returned to room and left sitting in [facility] with call bell in reach and quick release belt in place.         Therapy Documentation Precautions:  Precautions Precautions: Back Precaution Comments: unable to recall 0/3 back precautions.  Required Braces  or Orthoses: Spinal Brace Spinal Brace: Applied in sitting position Restrictions Weight Bearing Restrictions: Yes RLE Weight Bearing: Non weight bearing LLE Weight Bearing: Weight bearing as tolerated Other Position/Activity Restrictions: CAM boot for RLE due to ankle fx, shoe for left foot General:   Vital Signs: Therapy Vitals Temp: 97.8 F (36.6 C) Temp Source: Oral Pulse Rate: 68 Resp: 18 BP: 133/70 Patient Position (if appropriate): Lying Oxygen Therapy SpO2: 92 % O2 Device: Not Delivered   See Function Navigator for Current Functional Status.   Therapy/Group: Individual Therapy  [name] 11/14/2015, 7:59 AM

## 2015-11-14 NOTE — Progress Notes (Signed)
Occupational Therapy Session Note  Patient Details  Name: Mitchell [name] MRN: [id number] Date of Birth: May 15, 1934  Today's Date: 11/14/2015 OT Individual Time: 2694-8546 OT Individual Time Calculation (min): 28 min    Short Term Goals: Week 2:  OT Short Term Goal 1 (Week 2): Pt will sit to stand with min assist with RW to prepare for LB dressing  OT Short Term Goal 1 - Progress (Week 2): Met OT Short Term Goal 2 (Week 2): Pt will don pants over feet with reacher with min assist  OT Short Term Goal 2 - Progress (Week 2): Met OT Short Term Goal 3 (Week 2): Pt will bathe LB with LH sponge with mod assist  OT Short Term Goal 3 - Progress (Week 2): Met OT Short Term Goal 4 (Week 2): Pt will transfer to [facility] using scoot transfer with min assist  OT Short Term Goal 4 - Progress (Week 2): Met  Skilled Therapeutic Interventions/Progress Updates:    Treatment session with focus on medication management. Pt lying to seated at EOB with S, and pt using elevated HOB and bedrails for assist. Pt engaged in variation of The Pillbox Assessment to elicit executive function capabilities. Pt completed x2 simulated medication doses for x1 day with Max Verbal Cues for understanding dosage, timing of dose, and use of pillbox despite previous verbal and visual instructions which pt verbalized understanding of. Pt stated that weekly pillbox with time of day for dose administration would be helpful tool in his home and for his wife. Pt stated that wife usually administers and tracks medications, of which pt stated both he and wife take multiple per day. Pt education provided on usefulness of pillbox in reducing incidence of missed dose and medication interactions.   Pt sit to lying at EOB with mod verbal cues for sequencing, at Mod A overall.   Therapy Documentation Precautions:  Precautions Precautions: Back Precaution Comments: unable to recall 0/3 back precautions.  Required Braces or Orthoses: Spinal  Brace Spinal Brace: Applied in sitting position Restrictions Weight Bearing Restrictions: Yes RLE Weight Bearing: Non weight bearing LLE Weight Bearing: Weight bearing as tolerated Other Position/Activity Restrictions: CAM boot for RLE due to ankle fx, shoe for left foot General:   Vital Signs: Therapy Vitals Temp: 98.2 F (36.8 C) Temp Source: Oral Pulse Rate: 65 Resp: 18 BP: 120/87 Patient Position (if appropriate): Lying Oxygen Therapy SpO2: 98 % O2 Device: Not Delivered Pain: Pain Assessment Pain Score: Asleep ADL: ADL ADL Comments: Refer to functional navigator Exercises:   Other Treatments:    See Function Navigator for Current Functional Status.   Therapy/Group: Individual Therapy  Dierdre Searles 11/14/2015, 3:56 PM

## 2015-11-14 NOTE — Progress Notes (Signed)
Occupational Therapy Session Note  Patient Details  Name: GAY RAPE MRN: [id number] Date of Birth: 22-Apr-1934  Today's Date: 11/14/2015 OT Individual Time: 9758-8325 OT Individual Time Calculation (min): 60 min     Short Term Goals:Week 1:  OT Short Term Goal 1 (Week 1): Pt will sit to stand with min-mod A with RW to prepare for LB dressing. OT Short Term Goal 1 - Progress (Week 1): Partly met (pt fluctuates, mod assist at times, +2 at times) OT Short Term Goal 2 (Week 1): Pt will don pants over feet with reacher with min A. OT Short Term Goal 2 - Progress (Week 1): Progressing toward goal OT Short Term Goal 3 (Week 1): Pt will bathe LB with long sponge with mod A. OT Short Term Goal 3 - Progress (Week 1): Progressing toward goal OT Short Term Goal 4 (Week 1): Pt will transfer to [facility] using scoot transfer with min A. OT Short Term Goal 4 - Progress (Week 1): Not met Week 2:  OT Short Term Goal 1 (Week 2): Pt will sit to stand with min assist with RW to prepare for LB dressing  OT Short Term Goal 2 (Week 2): Pt will don pants over feet with reacher with min assist  OT Short Term Goal 3 (Week 2): Pt will bathe LB with LH sponge with mod assist  OT Short Term Goal 4 (Week 2): Pt will transfer to [facility] using scoot transfer with min assist      Skilled Therapeutic Interventions/Progress Updates:    Pt seen for skilled OT to facilitate functional mobility and activity tolerance with ADL retraining and practice with use of AE. Pt is now WBAT on R foot with CAM boot discharged.  Pt received in bed ready for therapy. Sat to EOB with min A for B/D. Pt is using long sponge and reacher well to wash feet and don pants over feet. Improved sit to stand from EOB with touching A and improved standing balance (no posterior lean) with clothing management over hips. He also practiced ambulation with RW in and out of bathroom to toilet. Pt is tolerating weight through ankle well. Pt was feeling some fatigue  at end of session,but no pain. Pt resting in w/c with spouse in room with him.   Therapy Documentation Precautions:  Precautions Precautions: Back Precaution Comments: unable to recall 0/3 back precautions.  Required Braces or Orthoses: Spinal Brace Spinal Brace: Applied in sitting position Restrictions Weight Bearing Restrictions: Yes RLE Weight Bearing: Non weight bearing LLE Weight Bearing: Weight bearing as tolerated Other Position/Activity Restrictions: CAM boot for RLE due to ankle fx, shoe for left foot    Vital Signs: Therapy Vitals Temp: 97.8 F (36.6 C) Temp Source: Oral Pulse Rate: 68 Resp: 18 BP: 133/70 Patient Position (if appropriate): Lying Oxygen Therapy SpO2: 95 % O2 Device: Not Delivered Pain: No c/o pain  ADL: ADL ADL Comments: Refer to functional navigator  See Function Navigator for Current Functional Status.   Therapy/Group: Individual Therapy  Zyionna Pesce 11/14/2015, 8:52 AM

## 2015-11-15 ENCOUNTER — Inpatient Hospital Stay (HOSPITAL_COMMUNITY): Payer: Medicare Other | Admitting: Physical Therapy

## 2015-11-15 NOTE — Progress Notes (Signed)
Physical Therapy Session Note  Patient Details  Name: Mitchell [name] MRN: [id number] Date of Birth: Oct 02, 1934  Today's Date: 11/15/2015 PT Individual Time: 6384-6659 PT Individual Time Calculation (min): 57 min    Short Term Goals: Week 1:  PT Short Term Goal 1 (Week 1): Patient will consistent perform stand pivot transfer and sit<>stand with Mod A and RW.  PT Short Term Goal 1 - Progress (Week 1): Partly met PT Short Term Goal 2 (Week 1): Patient will performed bed mobility with min A.  PT Short Term Goal 2 - Progress (Week 1): Progressing toward goal PT Short Term Goal 3 (Week 1): Patient will  ambulate 81f with Mod A from PT with RW  PT Short Term Goal 3 - Progress (Week 1): Met PT Short Term Goal 4 (Week 1): Patient will perform car transfer with max A and RW.  PT Short Term Goal 4 - Progress (Week 1): Met Week 2:  PT Short Term Goal 1 (Week 2): Patient will perform sit<>stand and stand pivot with min A and max cues with RW.  PT Short Term Goal 2 (Week 2): Patient will ambulate 515fwith min A and RW.  PT Short Term Goal 3 (Week 2): Patient will perform bed mobility with min A and use of bed features.  PT Short Term Goal 4 (Week 2): Patient will perform WC mobility consistently with supervision Afor >15040fPT Short Term Goal 5 (Week 2): Patient will initiate stair training.   Skilled Therapeutic Interventions/Progress Updates:    Patient received sitting in WC. And agreeable to PT.   [facility] supervision A for 150f66fd min cues for improved use of BUE in turns.   Gait training instructed by PT for 70ft2fft,20fd 30ft w34fRW and L AFO. PT provided min cues for increased step width to improve stability as well as improved AD management in turns.   Variable gait training with RW including side stepping 2x5 ft L and R with RW and Forward backward gait 2x 10ft wi30fW. PT provided mod cues for Ad management and proper LE positioning for improved  success. Patient demonstrated increased difficulty with step to L.   PT instructed patient in sit<>stand transfer training with Min A x 6 progressing to supervision A x 2 from standard seat height and push from arm rests throughout therapy. PT provided min cues for improved anterior weight shift as well as increased use of BLE to control descent into chair. PT instructed patients wife in proper gaurding and cueing for sit<>stand x 2 with min A from wife. PT educated wife in improve cues for safety of transfers.   Balance training:  Standing on level surface with 1 UE support 3 x 45 seconds. Min A - supervision A from PT Standing on level surface without UE support 2x 1 seconds with intermittent single UE support on RW with min A from PT.  Toe taps on 6 inch step with BUE support on RW 2x 5 BLE. PT provided min A with tactile cues for improve L pelvic stability with toe tap on R to allow increased hip flexion/knee to lift foot off step   Patient returned to room and left sitting in [facility] reach.      Therapy Documentation Precautions:  Precautions Precautions: Back Precaution Comments: unable to recall 0/3 back precautions.  Required Braces or Orthoses: Spinal Brace Spinal Brace: Applied in sitting position Restrictions Weight Bearing  Restrictions: Yes RLE Weight Bearing: Non weight bearing LLE Weight Bearing: Weight bearing as tolerated Other Position/Activity Restrictions: CAM boot for RLE due to ankle fx, shoe for left foot General:   Vital Signs: Therapy Vitals Temp: 99 F (37.2 C) Temp Source: Oral Pulse Rate: 66 Resp: 18 BP: (!) 116/56 Patient Position (if appropriate): Sitting Oxygen Therapy SpO2: 100 % O2 Device: Not Delivered Pain: 0/10    See Function Navigator for Current Functional Status.   Therapy/Group: Individual Therapy  [name] 11/15/2015, 2:49 PM

## 2015-11-15 NOTE — Progress Notes (Signed)
Orthopedic Tech Progress Note Patient Details:  Mitchell [name] 1934-06-13 [id number]  Patient ID: Mitchell [name], male   DOB: [birth year], 81 y.o.   MRN: [id number]   [name] 11/15/2015, 9:27 AMCalled [employer] for Right AFO brace.

## 2015-11-15 NOTE — Progress Notes (Signed)
[place] PHYSICAL MEDICINE & REHABILITATION     PROGRESS NOTE  Subjective/Complaints:  Wife states he slept well. Pt still with some HS confusion/doesn't recall events of overnight really.    ROS: Denies CP, SOB, N/V/D.  Objective: Vital Signs: Blood pressure (!) 137/56, pulse 66, temperature 97.7 F (36.5 C), temperature source Oral, resp. rate 18, height 6\' 3"  (1.905 m), weight 124.8 kg (275 lb 1.6 oz), SpO2 95 %. No results found.  Recent Labs  11/14/15 0751  WBC 8.2  HGB 10.2*  HCT 33.0*  PLT 386    Recent Labs  11/14/15 0751  NA 136  K 4.2  CL 105  GLUCOSE 104*  BUN 8  CREATININE 0.55*  CALCIUM 8.4*   CBG (last 3)  No results for input(s): GLUCAP in the last 72 hours.  Wt Readings from Last 3 Encounters:  11/10/15 124.8 kg (275 lb 1.6 oz)  10/24/15 122.5 kg (270 lb)  10/16/15 122.5 kg (270 lb)    Physical Exam:  BP (!) 137/56 (BP Location: Right Arm)   Pulse 66   Temp 97.7 F (36.5 C) (Oral)   Resp 18   Ht 6\' 3"  (1.905 m)   Wt 124.8 kg (275 lb 1.6 oz)   SpO2 95%   BMI 34.39 kg/m  Constitutional: He appears well-developed and well-nourished.  HENT: Normocephalic and atraumatic.  Eyes: Conjunctivae are normal. Cardiovascular: Normal rate and regular rhythm.  Respiratory: Effort normal and breath sounds normal. No stridor. No respiratory distress. He has no wheezes.  GI: Soft. Bowel sounds are normal. He exhibits no distension. There is no tenderness.  Musculoskeletal: He exhibits edema. He exhibits no tenderness.  Neurological: He is alert and oriented x2. Follows simple commands HOH Motor: B/l UE: 4+/5 grossly proximal to distal RLE: hip flexion, knee extension 4+/5, ankle dorsi/plantarflexion 4-/5 LLE: hip flexion, knee extension 4+/5, ankle dorsi/plantar flexion 4+/5  Skin: Skin is warm and dry. No erythema. Back incision with dressing, improving drainage, now with VAC.  Psychiatric: He has a normal mood and affect. Thought content normal.     Assessment/Plan: 1. Functional deficits secondary to lumbar radiculopathy L2/3 and kyphosis with L4/5 spondylolisthesis with hx of right foot drop which require 3+ hours per day of interdisciplinary therapy in a comprehensive inpatient rehab setting. Physiatrist is providing close team supervision and 24 hour management of active medical problems listed below. Physiatrist and rehab team continue to assess barriers to discharge/monitor patient progress toward functional and medical goals.  Function:  Bathing Bathing position   Position: Sitting EOB  Bathing parts Body parts bathed by patient: Right arm, Left arm, Chest, Abdomen, Front perineal area, Right upper leg, Left upper leg, Right lower leg, Left lower leg, Buttocks Body parts bathed by helper: Back  Bathing assist Assist Level: Touching or steadying assistance(Pt > 75%), 2 helpers      Upper Body Dressing/Undressing Upper body dressing   What is the patient wearing?: Pull over shirt/dress, Orthosis     Pull over shirt/dress - Perfomed by patient: Thread/unthread right sleeve, Thread/unthread left sleeve, Put head through opening, Pull shirt over trunk Pull over shirt/dress - Perfomed by helper: Pull shirt over trunk Button up shirt - Perfomed by patient: Thread/unthread right sleeve, Thread/unthread left sleeve, Button/unbutton shirt Button up shirt - Perfomed by helper: Pull shirt around back Orthosis activity level: Performed by helper  Upper body assist Assist Level: Set up   Set up : To apply TLSO, cervical collar  Lower Body Dressing/Undressing Lower  body dressing   What is the patient wearing?: Pants, Underwear, [employer], Shoes Underwear - Performed by patient: Thread/unthread right underwear leg, Thread/unthread left underwear leg Underwear - Performed by helper: Pull underwear up/down Pants- Performed by patient: Thread/unthread right pants leg, Thread/unthread left pants leg, Pull pants up/down Pants- Performed  by helper: Pull pants up/down, Thread/unthread right pants leg, Thread/unthread left pants leg           Shoes - Performed by helper: Don/doff right shoe, Don/doff left shoe, Fasten right, Fasten left       TED Hose - Performed by helper: Don/doff right TED hose, Don/doff left TED hose  Lower body assist Assist for lower body dressing: Touching or steadying assistance (Pt > 75%)      Toileting Toileting Toileting activity did not occur: N/A   Toileting steps completed by helper: Adjust clothing prior to toileting, Performs perineal hygiene, Adjust clothing after toileting Toileting Assistive Devices: Grab bar or rail  Toileting assist Assist level: Two helpers   Transfers Chair/bed transfer   Chair/bed transfer method: Stand pivot Chair/bed transfer assist level: Touching or steadying assistance (Pt > 75%) Chair/bed transfer assistive device: Armrests, Medical sales representative     Max distance: 31ft Assist level: Touching or steadying assistance (Pt > 75%)   Wheelchair   Type: Manual Max wheelchair distance: 150 Assist Level: Touching or steadying assistance (Pt > 75%)  Cognition Comprehension Comprehension assist level: Understands basic 75 - 89% of the time/ requires cueing 10 - 24% of the time  Expression Expression assist level: Expresses basic 75 - 89% of the time/requires cueing 10 - 24% of the time. Needs helper to occlude trach/needs to repeat words.  Social Interaction Social Interaction assist level: Interacts appropriately 90% of the time - Needs monitoring or encouragement for participation or interaction.  Problem Solving Problem solving assist level: Solves basic 50 - 74% of the time/requires cueing 25 - 49% of the time  Memory Memory assist level: Recognizes or recalls 50 - 74% of the time/requires cueing 25 - 49% of the time     Medical Problem List and Plan: 1. Gait and cognitive deficits with weakness secondary to lumbar radiculopathy L2/3  and kyphosis with L4/5 spondylolisthesis with hx of right foot drop  Cont CIR 2. DVT Prophylaxis/Anticoagulation: Pharmaceutical: Lovenox added as 5 days post op.  3. Pain Management:  Ultram qid for now, d/ced on 7/19 due to AMS   Increased tylenol on 7/24--pain appears controlled at present 4. Mood: LCSW to follow for evaluation and support.  5. Neuropsych: This patient is not capable of making decisions on his own behalf.  Klonipin d/ced  Seroquel 12.5 qhs d/ced on 7/26  Trazodone 25mg  PRN, pt medication sensitive  CT head from 7/20 reviewed, no acute changes, small vessel disease  Ammonia normal 6. Skin/Wound Care: Monitor wound for healing. Maintain adequate nutrition and hydration status.   Surgical wound infection. Appreciate Neurosurg and ID recs. (improving)   IV Abx, now on PO Levoquin.    Now with VAC  7. Fluids/Electrolytes/Nutrition: Monitor I/O.   Hyponatremia: Resolved 136 on 7/28 8. HTN: Monitor BP bid. On amlodipine and atenolol.  9. Ureteral stricture: Currently voiding without difficulty Monitor I/O.  No I/O Cath, per Urology due to potential surgery and risk for further injury.   -condom cath HS 10. Ankle fracture: Xray reviewed on 7/25 with no healing of fracture.  Per Ortho, d/c CAM boot, WBAT. 11. Depression/Anxiety: On cymbalta 12.  GERD: On protonix.  13. Asthma: Encourage IS. On dulera for hyperactive airway.  14. Constipation: Increased miralax to bid. 15. CAD: Cont meds 16. Obesity: Body mass index is 33.75 kg/(m^2) on admission, diet and exercise education, encourage weight loss to increase endurance and promote overall health 17. Right ankle fracture: Cont boot 18. Chronic back pain: With severe central canal stenosis and foraminal narrowing: Cont meds 19. Right RTC tear and DJD right shoulder: Cont meds 20. Cognitive deficits with difficulty processing: Likely secondary to narcotics +/- infection. 21. Leukocytosis:  Resolved  Wound cx with Pseudomonas and Klebsiella, Levaquin stopped 7/27  WBCs 8.2 on 7/28  Abx d/ced per ID  UA neg, Ucx multiple species 22. ABLA  Hb 10.2 on 7/28  Cont to monitor 13. Confusion: Improving. Perhaps some underlying baseline dementia too  Likely secondary to infection +/- medications  Will cont to monitor, medications also reviewed, pain and sleep medications adjusted. 14. Nocturnal urinary incontinence  Cont condom cath to avoid nighttime awakenings and improve sleep  LOS (Days) 17 A FACE TO FACE EVALUATION WAS PERFORMED  SWARTZ,ZACHARY T 11/15/2015 8:13 AM

## 2015-11-16 ENCOUNTER — Inpatient Hospital Stay (HOSPITAL_COMMUNITY): Payer: Medicare Other | Admitting: Physical Therapy

## 2015-11-16 NOTE — Plan of Care (Signed)
Problem: SCI BLADDER ELIMINATION Goal: RH STG MANAGE BLADDER WITH ASSISTANCE STG Manage Bladder With mod I Assistance  Outcome: Not Progressing Total assist at [facility] with condom cath

## 2015-11-16 NOTE — Progress Notes (Signed)
Physical Therapy Weekly Progress Note  Patient Details  Name: Mitchell [name] MRN: [id number] Date of Birth: May 09, 1934  Beginning of progress report period: November 06, 2015 End of progress report period: November 16, 2015  Today's Date: 11/16/2015 PT Individual Time: 1004-1101 PT Individual Time Calculation (min): 57 min    Patient has met 4 of 4 short term goals.  Pt has increased his endurance and activity tolerance as noted by his ability to ambulate increased distances with RW & Min A. Per ortho MD, pt has been cleared (on 11/13/15) to ambulate without R CAM boot & WBAT. Pt continues to require Min A due to unsteadiness on feet, decreased balance & decreased righting reactions. Pt with impaired cognition and inability to recall back precautions.   Provided pt with R AFO for improved foot clearance, as pt has history of foot drop.   Patient continues to demonstrate the following deficits: decreased balance & righting reactions, decreased overall strength, impaired cognition, decreased safety awareness, decreased ambulatory ability and therefore will continue to benefit from skilled PT intervention to enhance overall performance with activity tolerance, balance, postural control, ability to compensate for deficits, awareness, coordination and knowledge of precautions. Pt & family will benefit from hands on PT education/training prior to d/c.   Patient progressing toward long term goals..  Continue plan of care.  PT Short Term Goals Week 2:  PT Short Term Goal 1 (Week 2): Patient will ambulate 53f with min A and RW.  PT Short Term Goal 1 - Progress (Week 2): Met PT Short Term Goal 2 (Week 2): Patient will perform bed mobility with min A and use of bed features.  PT Short Term Goal 2 - Progress (Week 2): Met PT Short Term Goal 3 (Week 2): Patient will perform WC mobility consistently with supervision Afor >1575f PT Short Term Goal 3 - Progress (Week 2): Met PT Short Term Goal 4 (Week 2):  Patient will initiate stair training.  PT Short Term Goal 4 - Progress (Week 2): Met PT Short Term Goal 5 (Week 2): Patient will initiate stair training.  Week 3:  PT Short Term Goal 1 (Week 3): STG = LTG due to estimated d/c date.    Skilled Therapeutic Interventions/Progress Updates:    Pt received in w/c & agreeable to PT, noting 3/10 back pain. Pt able to recall he is in hospital when given a choice of 2, recall name of hospital when given first letter of name, but unable to recall correct year or back surgery as reason for admission. Pt transferred sit<>Stand with steady A & attempted to use urinal with Min A & RW for standing balance, but unable to void. Pt transported down to hallway by gym & completed gait training x 90 ft + 160 ft with RW & min A + w/c follow for safety. Pt with occasional losses of balance requiring Min A to correct, as well as cuing for increased step length & width. Pt with decreased ankle valgus with use of R AFO. Provided pt's wife with home measurement sheet & requested she take pictures of bathroom. Pt's wife requested to be checked off to assist pt with transfers. Pt transferred sit>supine on mat table via log rolling, mod A & maximum cuing for technique. Pt's wife able to assist pt with transfer supine>sit with maximum cuing & education from therapist for positioning to log roll. Pt's wife able to don back brace with supervision and assist pt to w/c via stand pivot with  RW. Educated pt & wife on inability to check wife off for transfers as they both require further education on log rolling, positioning for pt & wife's safety, & managing wound vac; both pt & wife in agreement. At end of session pt left sitting in w/c in room with wife present & all needs within reach.   Therapy Documentation Precautions: Precautions Precautions: Back Precaution Comments: unable to recall 0/3 back precautions.  Required Braces or Orthoses: Spinal Brace Spinal Brace: Applied in sitting  position   Pain: Pain Assessment Pain Assessment: 0-10 Pain Score: 3  Pain Location: Back Pain Intervention(s): Ambulation/increased activity   See Function Navigator for Current Functional Status.  Therapy/Group: Individual Therapy  [name] 11/16/2015, 12:10 PM

## 2015-11-16 NOTE — Progress Notes (Signed)
Restless night. Condom cath applied at HS. PRN tylenol given at 2207 and robaxin at 0250. Up to BR at 0330, continent BM. Assisted back to bed, Large,  mushy incontinent stool. Total assist with hygiene and clean up. Mitchell [name] A

## 2015-11-16 NOTE — Progress Notes (Signed)
[place] PHYSICAL MEDICINE & REHABILITATION     PROGRESS NOTE  Subjective/Complaints:  Slept reasonably well. Had to move his bowels early this morning and hasn't really gotten back to sleep since---normal bm consistency   ROS: Denies CP, SOB, N/V/D.  Objective: Vital Signs: Blood pressure (!) 126/56, pulse 69, temperature 98.9 F (37.2 C), temperature source Oral, resp. rate 18, height 6\' 3"  (1.905 m), weight 124.8 kg (275 lb 1.6 oz), SpO2 99 %. No results found.  Recent Labs  11/14/15 0751  WBC 8.2  HGB 10.2*  HCT 33.0*  PLT 386    Recent Labs  11/14/15 0751  NA 136  K 4.2  CL 105  GLUCOSE 104*  BUN 8  CREATININE 0.55*  CALCIUM 8.4*   CBG (last 3)  No results for input(s): GLUCAP in the last 72 hours.  Wt Readings from Last 3 Encounters:  11/10/15 124.8 kg (275 lb 1.6 oz)  10/24/15 122.5 kg (270 lb)  10/16/15 122.5 kg (270 lb)    Physical Exam:  BP (!) 126/56 (BP Location: Left Arm)   Pulse 69   Temp 98.9 F (37.2 C) (Oral)   Resp 18   Ht 6\' 3"  (1.905 m)   Wt 124.8 kg (275 lb 1.6 oz)   SpO2 99%   BMI 34.39 kg/m  Constitutional: He appears well-developed and well-nourished.  HENT: Normocephalic and atraumatic.  Eyes: Conjunctivae are normal. Cardiovascular: Normal rate and regular rhythm.  Respiratory: Effort normal and breath sounds normal. No stridor. No respiratory distress. He has no wheezes.  GI: Soft. Bowel sounds are normal. He exhibits no distension. There is no tenderness.  Musculoskeletal: He exhibits edema. He exhibits no tenderness.  Neurological: He is alert and oriented x2. Follows simple commands HOH Motor: B/l UE: 4+/5 grossly proximal to distal RLE: hip flexion, knee extension 4+/5, ankle dorsi/plantarflexion 4-/5 LLE: hip flexion, knee extension 4+/5, ankle dorsi/plantar flexion 4+/5  Skin: Skin is warm and dry. No erythema. Back incision with dressing, improving drainage, now with VAC.  Psychiatric: He has a normal mood and  affect. Thought content normal.    Assessment/Plan: 1. Functional deficits secondary to lumbar radiculopathy L2/3 and kyphosis with L4/5 spondylolisthesis with hx of right foot drop which require 3+ hours per day of interdisciplinary therapy in a comprehensive inpatient rehab setting. Physiatrist is providing close team supervision and 24 hour management of active medical problems listed below. Physiatrist and rehab team continue to assess barriers to discharge/monitor patient progress toward functional and medical goals.  Function:  Bathing Bathing position   Position: Sitting EOB  Bathing parts Body parts bathed by patient: Right arm, Left arm, Chest, Abdomen, Front perineal area, Right upper leg, Left upper leg, Right lower leg, Left lower leg, Buttocks Body parts bathed by helper: Back  Bathing assist Assist Level: Touching or steadying assistance(Pt > 75%), 2 helpers      Upper Body Dressing/Undressing Upper body dressing   What is the patient wearing?: Pull over shirt/dress, Orthosis     Pull over shirt/dress - Perfomed by patient: Thread/unthread right sleeve, Thread/unthread left sleeve, Put head through opening, Pull shirt over trunk Pull over shirt/dress - Perfomed by helper: Pull shirt over trunk Button up shirt - Perfomed by patient: Thread/unthread right sleeve, Thread/unthread left sleeve, Button/unbutton shirt Button up shirt - Perfomed by helper: Pull shirt around back Orthosis activity level: Performed by helper  Upper body assist Assist Level: Set up   Set up : To apply TLSO, cervical collar  Lower Body Dressing/Undressing Lower body dressing   What is the patient wearing?: Pants, Underwear, [employer], Shoes Underwear - Performed by patient: Thread/unthread right underwear leg, Thread/unthread left underwear leg Underwear - Performed by helper: Pull underwear up/down Pants- Performed by patient: Thread/unthread right pants leg, Thread/unthread left pants leg, Pull  pants up/down Pants- Performed by helper: Pull pants up/down, Thread/unthread right pants leg, Thread/unthread left pants leg           Shoes - Performed by helper: Don/doff right shoe, Don/doff left shoe, Fasten right, Fasten left       TED Hose - Performed by helper: Don/doff right TED hose, Don/doff left TED hose  Lower body assist Assist for lower body dressing: Touching or steadying assistance (Pt > 75%)      Toileting Toileting Toileting activity did not occur: No continent bowel/bladder event   Toileting steps completed by helper: Adjust clothing prior to toileting, Performs perineal hygiene, Adjust clothing after toileting Toileting Assistive Devices: Grab bar or rail  Toileting assist Assist level: Two helpers   Transfers Chair/bed transfer   Chair/bed transfer method: Stand pivot Chair/bed transfer assist level: Touching or steadying assistance (Pt > 75%) Chair/bed transfer assistive device: Armrests, Medical sales representative     Max distance: 59ft Assist level: Touching or steadying assistance (Pt > 75%)   Wheelchair   Type: Manual Max wheelchair distance: 153ft Assist Level: Supervision or verbal cues  Cognition Comprehension Comprehension assist level: Understands basic 75 - 89% of the time/ requires cueing 10 - 24% of the time  Expression Expression assist level: Expresses basic 75 - 89% of the time/requires cueing 10 - 24% of the time. Needs helper to occlude trach/needs to repeat words.  Social Interaction Social Interaction assist level: Interacts appropriately 90% of the time - Needs monitoring or encouragement for participation or interaction.  Problem Solving Problem solving assist level: Solves basic 50 - 74% of the time/requires cueing 25 - 49% of the time  Memory Memory assist level: Recognizes or recalls 50 - 74% of the time/requires cueing 25 - 49% of the time     Medical Problem List and Plan: 1. Gait and cognitive deficits with  weakness secondary to lumbar radiculopathy L2/3 and kyphosis with L4/5 spondylolisthesis with hx of right foot drop  Cont CIR therapies 2. DVT Prophylaxis/Anticoagulation: Pharmaceutical: Lovenox added as 5 days post op.  3. Pain Management:  Ultram qid for now, d/ced on 7/19 due to AMS   Increased tylenol on 7/24--pain appears controlled at present 4. Mood: LCSW to follow for evaluation and support.  5. Neuropsych: This patient is not capable of making decisions on his own behalf.  Klonipin d/ced  Seroquel 12.5 qhs d/ced on 7/26  Trazodone 25mg  PRN, pt medication sensitive  CT head from 7/20 reviewed, no acute changes, small vessel disease  Ammonia normal 6. Skin/Wound Care: Monitor wound for healing. Maintain adequate nutrition and hydration status.   Surgical wound infection. Appreciate Neurosurg and ID recs. (improving)   IV Abx, now on PO Levoquin.    VAC with minimal drainage---DC????? 7. Fluids/Electrolytes/Nutrition: Monitor I/O.   Hyponatremia: Resolved 136 on 7/28 8. HTN: Monitor BP bid. On amlodipine and atenolol.  9. Ureteral stricture: Currently voiding without difficulty Monitor I/O.  No I/O Cath, per Urology due to potential surgery and risk for further injury.   -condom cath HS 10. Ankle fracture: Xray reviewed on 7/25 with no healing of fracture.  Per Ortho, d/c CAM boot, WBAT.  11. Depression/Anxiety: On cymbalta 12. GERD: On protonix.  13. Asthma: Encourage IS. On dulera for hyperactive airway.  14. Constipation: Increased miralax to bid. 15. CAD: Cont meds 16. Obesity: Body mass index is 33.75 kg/(m^2) on admission, diet and exercise education, encourage weight loss to increase endurance and promote overall health 17. Right ankle fracture: Cont boot 18. Chronic back pain: With severe central canal stenosis and foraminal narrowing: Cont meds 19. Right RTC tear and DJD right shoulder: Cont meds 20. Cognitive deficits with difficulty  processing: Likely secondary to narcotics +/- infection. 21. Leukocytosis: Resolved  Wound cx with Pseudomonas and Klebsiella, Levaquin stopped 7/27  WBCs 8.2 on 7/28  Abx d/ced per ID  UA neg, Ucx multiple species 22. ABLA  Hb 10.2 on 7/28  Cont to monitor 13. Confusion: Improving. Perhaps some underlying baseline dementia too  Likely secondary to infection +/- medications  Will cont to monitor, medications also reviewed, pain and sleep medications adjusted. 14. Nocturnal urinary incontinence  Cont condom cath to avoid nighttime awakenings and improve sleep  LOS (Days) 18 A FACE TO FACE EVALUATION WAS PERFORMED  Kaliel Bolds T 11/16/2015 8:06 AM

## 2015-11-16 NOTE — Progress Notes (Signed)
Wound vac leaking today. Reinforced dressing and leak resolved.

## 2015-11-17 ENCOUNTER — Inpatient Hospital Stay (HOSPITAL_COMMUNITY): Payer: Medicare Other | Admitting: Physical Therapy

## 2015-11-17 ENCOUNTER — Inpatient Hospital Stay (HOSPITAL_COMMUNITY): Payer: Medicare Other | Admitting: Occupational Therapy

## 2015-11-17 ENCOUNTER — Inpatient Hospital Stay (HOSPITAL_COMMUNITY): Payer: Medicare Other

## 2015-11-17 DIAGNOSIS — T814XXS Infection following a procedure, sequela: Secondary | ICD-10-CM

## 2015-11-17 MED ORDER — ACETAMINOPHEN 500 MG PO TABS
500.0000 mg | ORAL_TABLET | Freq: Four times a day (QID) | ORAL | Status: DC | PRN
  Administered 2015-11-18 – 2015-11-22 (×14): 500 mg via ORAL
  Filled 2015-11-17 (×15): qty 1

## 2015-11-17 NOTE — Progress Notes (Signed)
Physical Therapy Note  Patient Details  Name: [name] MRN: [id number] Date of Birth: 15-Nov-1934 Today's Date: 11/17/2015  1105-1135, 30 min individual tx Pain: 3/10 low back  Therapeutic exercise performed with LE to increase strength for functional mobility in sitting: 5 x 1 marching, and long arc quad knee ext, R/L.  Gait with RW x 25' on low carpet, with 1 LOB requiring mod asisst to regain balance, stand> sit without reaching back with poor eccentric control. Stand>< sit working on eccentric control.  With max cueing, pt continued to forget to reach back before sitting.Pt has long standing hx of pulling up on a bar to stand, and leaving hands on bar as he sat.    Mitchell [name] 11/17/2015, 11:20 AM

## 2015-11-17 NOTE — Progress Notes (Signed)
[place] PHYSICAL MEDICINE & REHABILITATION     PROGRESS NOTE  Subjective/Complaints:  Pt laying in bed.  He is more alert and oriented this AM.  Wife notes some loose stools with Robaxin.    ROS: Denies CP, SOB, N/V/D.  Objective: Vital Signs: Blood pressure 125/61, pulse 63, temperature 98.1 F (36.7 C), temperature source Oral, resp. rate 20, height 6\' 3"  (1.905 m), weight 124.8 kg (275 lb 1.6 oz), SpO2 96 %. No results found. No results for input(s): WBC, HGB, HCT, PLT in the last 72 hours. No results for input(s): NA, K, CL, GLUCOSE, BUN, CREATININE, CALCIUM in the last 72 hours.  Invalid input(s): CO CBG (last 3)  No results for input(s): GLUCAP in the last 72 hours.  Wt Readings from Last 3 Encounters:  11/10/15 124.8 kg (275 lb 1.6 oz)  10/24/15 122.5 kg (270 lb)  10/16/15 122.5 kg (270 lb)    Physical Exam:  BP 125/61   Pulse 63   Temp 98.1 F (36.7 C) (Oral)   Resp 20   Ht 6\' 3"  (1.905 m)   Wt 124.8 kg (275 lb 1.6 oz)   SpO2 96%   BMI 34.39 kg/m  Constitutional: He appears well-developed and well-nourished.  HENT: Normocephalic and atraumatic.  Eyes: Conjunctivae are normal. Cardiovascular: Normal rate and regular rhythm.  Respiratory: Effort normal and breath sounds normal. No stridor. No respiratory distress. He has no wheezes.  GI: Soft. Bowel sounds are normal. He exhibits no distension. There is no tenderness.  Musculoskeletal: He exhibits edema. He exhibits no tenderness.  Neurological: He is alert and oriented x2.  Follows simple commands HOH Motor: B/l UE: 4+/5 grossly proximal to distal RLE: hip flexion, knee extension 4+/5, ankle dorsi/plantarflexion 4-/5 LLE: hip flexion, knee extension 4+/5, ankle dorsi/plantar flexion 4+/5  Skin: Skin is warm and dry. No erythema. Back incision with dressing, improving drainage, now with VAC.  Psychiatric: He has a normal mood and affect. Thought content normal.    Assessment/Plan: 1. Functional  deficits secondary to lumbar radiculopathy L2/3 and kyphosis with L4/5 spondylolisthesis with hx of right foot drop which require 3+ hours per day of interdisciplinary therapy in a comprehensive inpatient rehab setting. Physiatrist is providing close team supervision and 24 hour management of active medical problems listed below. Physiatrist and rehab team continue to assess barriers to discharge/monitor patient progress toward functional and medical goals.  Function:  Bathing Bathing position   Position: Sitting EOB  Bathing parts Body parts bathed by patient: Right arm, Left arm, Chest, Abdomen, Front perineal area, Right upper leg, Left upper leg, Right lower leg, Left lower leg, Buttocks Body parts bathed by helper: Back  Bathing assist Assist Level: Touching or steadying assistance(Pt > 75%), 2 helpers      Upper Body Dressing/Undressing Upper body dressing   What is the patient wearing?: Pull over shirt/dress, Orthosis     Pull over shirt/dress - Perfomed by patient: Thread/unthread right sleeve, Thread/unthread left sleeve, Put head through opening, Pull shirt over trunk Pull over shirt/dress - Perfomed by helper: Pull shirt over trunk Button up shirt - Perfomed by patient: Thread/unthread right sleeve, Thread/unthread left sleeve, Button/unbutton shirt Button up shirt - Perfomed by helper: Pull shirt around back Orthosis activity level: Performed by helper  Upper body assist Assist Level: Set up   Set up : To apply TLSO, cervical collar  Lower Body Dressing/Undressing Lower body dressing   What is the patient wearing?: Pants, Underwear, [employer], Shoes Underwear -  Performed by patient: Thread/unthread right underwear leg, Thread/unthread left underwear leg Underwear - Performed by helper: Pull underwear up/down Pants- Performed by patient: Thread/unthread right pants leg, Thread/unthread left pants leg, Pull pants up/down Pants- Performed by helper: Pull pants up/down,  Thread/unthread right pants leg, Thread/unthread left pants leg           Shoes - Performed by helper: Don/doff right shoe, Don/doff left shoe, Fasten right, Fasten left       TED Hose - Performed by helper: Don/doff right TED hose, Don/doff left TED hose  Lower body assist Assist for lower body dressing: Touching or steadying assistance (Pt > 75%)      Toileting Toileting Toileting activity did not occur: No continent bowel/bladder event Toileting steps completed by patient: Adjust clothing prior to toileting Toileting steps completed by helper: Adjust clothing prior to toileting, Performs perineal hygiene, Adjust clothing after toileting Toileting Assistive Devices: Grab bar or rail  Toileting assist Assist level: More than reasonable time, Touching or steadying assistance (Pt.75%)   Transfers Chair/bed transfer   Chair/bed transfer method: Ambulatory Chair/bed transfer assist level: Touching or steadying assistance (Pt > 75%) Chair/bed transfer assistive device: Medical sales representative     Max distance: 10 ft Assist level: Touching or steadying assistance (Pt > 75%)   Wheelchair   Type: Manual Max wheelchair distance: 196ft Assist Level: Supervision or verbal cues  Cognition Comprehension Comprehension assist level: Understands basic 75 - 89% of the time/ requires cueing 10 - 24% of the time  Expression Expression assist level: Expresses basic 75 - 89% of the time/requires cueing 10 - 24% of the time. Needs helper to occlude trach/needs to repeat words.  Social Interaction Social Interaction assist level: Interacts appropriately 90% of the time - Needs monitoring or encouragement for participation or interaction.  Problem Solving Problem solving assist level: Solves basic 50 - 74% of the time/requires cueing 25 - 49% of the time  Memory Memory assist level: Recognizes or recalls 50 - 74% of the time/requires cueing 25 - 49% of the time     Medical Problem  List and Plan: 1. Gait and cognitive deficits with weakness secondary to lumbar radiculopathy L2/3 and kyphosis with L4/5 spondylolisthesis with hx of right foot drop  Cont CIR therapies 2. DVT Prophylaxis/Anticoagulation: Pharmaceutical: Lovenox added as 5 days post op.  3. Pain Management:  Ultram qid for now, d/ced on 7/19 due to AMS   Increased tylenol on 7/24, changed to 500mg  4/day PRN 4. Mood: LCSW to follow for evaluation and support.  5. Neuropsych: This patient is not capable of making decisions on his own behalf.  Klonipin d/ced  Seroquel 12.5 qhs d/ced on 7/26  Trazodone 25mg  PRN, pt medication sensitive  CT head from 7/20 reviewed, no acute changes, small vessel disease  Ammonia normal 6. Skin/Wound Care: Monitor wound for healing. Maintain adequate nutrition and hydration status.   Surgical wound infection. Appreciate Neurosurg and ID recs. (improving)   IV Abx, now on PO Levoquin.    VAC with minimal drainage, will consider d/c 7. Fluids/Electrolytes/Nutrition: Monitor I/O.   Hyponatremia: Resolved 136 on 7/28 8. HTN: Monitor BP bid. On amlodipine and atenolol.  9. Ureteral stricture: Currently voiding without difficulty Monitor I/O.  No I/O Cath, per Urology due to potential surgery and risk for further injury.   -condom cath HS 10. Ankle fracture: Xray reviewed on 7/25 with no healing of fracture.  Per Ortho, d/c CAM boot, WBAT. 11. Depression/Anxiety:  Cymbalta d/ced due to ?side effects, although many other medications and infection also concomitant  12. GERD: On protonix.  13. Asthma: Encourage IS. On dulera for hyperactive airway.  14. Constipation: Increased miralax to bid. 15. CAD: Cont meds 16. Obesity: Body mass index is 33.75 kg/(m^2) on admission, diet and exercise education, encourage weight loss to increase endurance and promote overall health 17. Right ankle fracture: Cont boot 18. Chronic back pain: With severe central canal  stenosis and foraminal narrowing: Cont meds 19. Right RTC tear and DJD right shoulder: Cont meds 20. Cognitive deficits with difficulty processing: Likely secondary to narcotics +/- infection. 21. Leukocytosis: Resolved  Wound cx with Pseudomonas and Klebsiella, Levaquin stopped 7/27  WBCs 8.2 on 7/28  Abx d/ced per ID  UA neg, Ucx multiple species 22. ABLA  Hb 10.2 on 7/28  Cont to monitor 13. Confusion: Improving.   ?Baseline dementia   Likely secondary to infection +/- medications  Will cont to monitor, medications also reviewed, pain and sleep medications adjusted. 14. Nocturnal urinary incontinence  Cont condom cath to avoid nighttime awakenings and improve sleep  LOS (Days) 19 A FACE TO FACE EVALUATION WAS PERFORMED  Ankit [name] 11/17/2015 10:20 AM

## 2015-11-17 NOTE — Plan of Care (Signed)
Problem: SCI BLADDER ELIMINATION Goal: RH STG MANAGE BLADDER WITH ASSISTANCE STG Manage Bladder With mod I Assistance  Outcome: Not Progressing Patient requires assist at hs due to incontinent episodes during "middle of the night".   Problem: RH SAFETY Goal: RH STG ADHERE TO SAFETY PRECAUTIONS W/ASSISTANCE/DEVICE STG Adhere to Safety Precautions With Assistance/Device. Supervision  Outcome: Not Progressing Requires min assist for safety.

## 2015-11-17 NOTE — Progress Notes (Signed)
Orthopedic Tech Progress Note Patient Details:  Mitchell [name] November 14, 1934 [id number]  Patient ID: [name], male   DOB: [birth year], 81 y.o.   MRN: [id number]   Hildred Priest 11/17/2015, 10:48 AM Called in advanced brace order; spoke with [facility]

## 2015-11-17 NOTE — Progress Notes (Signed)
Social Work Patient ID: Mitchell [name], male   DOB: [birth year], 81 y.o.   MRN: [id number]   Met with [place] who feels pt needs to stay a few days longer due to meeting goals and medical issues. Spoke with MD he is agreeable to DC 8/5. Team feels he will be able to meet his goals by this date. Wife here and agreeable to this along with pt. Will work on discharge needs. Wife and pt had questions how long he would have th wound vac on. Will check with WOC RN.

## 2015-11-17 NOTE — Progress Notes (Signed)
Physical Therapy Session Note  Patient Details  Name: Mitchell [name] MRN: [id number] Date of Birth: Jan 12, 1935  Today's Date: 11/17/2015 PT Individual Time: 0804-0900 and [id number] PT Individual Time Calculation (min): 56 min and 55 min   Short Term Goals: Week 3:  PT Short Term Goal 1 (Week 3): STG = LTG due to estimated d/c date.   Skilled Therapeutic Interventions/Progress Updates:    Treatment 1: Pt received in bed & agreeable to PT; pt's wife present for session. Discussed need to extend pt's d/c date with pt & wife both agreeable. Discussed orthotic consult for pt to receive AFO. Pt transferred supine>sitting EOB via log rolling with bed rails, steady A & maximum cuing for log rolling technique. PT donned pt's back brace, ted hose, shorts, shoes & R AFO all total A for time management. Pt completed sit<>Stand transfers with min A to allow PT to complete donning pants. Pt transferred to w/c via ambulation x 5 ft with RW & min A; pt transported down to rehab gym total A for time management. Simulated car transfer from small truck height with pt transferring w/c<>car by ambulating 5 ft with RW & min A. Pt unable to transfer BLE in/out of car due to length of pt's LE's and inability to flex knees to place them in car. Pt reports his floor board is much different compared to simulator car; set up time to practice car transfer with wife & real car later today or tomorrow. Transported pt to rehab gym, pt ambulated  x 10 ft + 10 ft w/c<>bed with RW & min A, pt required Mod A on one occasion due to posterior loss of balance. Pt unable to recall proper hand placement for transfers during session & requires constant cuing. Pt transferred sitting EOB>L sidelying with Mod A for BLE and min A for rolling sidelying>supine. Pt able to roll L<>R with supervision but cuing to bend BLE knees for log rolling. Pt required steady A for L sidelying>sitting EOB and extra time as pt with difficulty pushing himself up to  sitting & reports dizziness with task that ceases immediately after. Pt required mod A & maximum cuing to transfer sit>stand from low bed; pt requires cuing for anterior weight shift & hand placement. At end of session pt left sitting in w/c in room with all needs within reach & wife present.   Treatment 2: Pt received in w/c & agreeable to PT, noting 3/10 back pain. Gait training in hallway 100 ft + 100 ft with RW & min A with pt demonstrating minimal heel strike & genu recurvatum. [employer] rights manager) present for orthotics consult. Placed shoe wedge in pt's R shoe and genu recurvatum decreased. Pt transported off of unit & outside to practice car transfer in real car. Pt able to perform car transfer with PT requiring Min A to ambulate to car but Mod A to transfer in/out of car as pt requires assistance to place BLE on running board to allow him to scoot back into seat and place BLE in/out of car. Pt requires cuing for turning to prevent tangling of wound vac cord & utilizes car cane for transfer. Pt & wife return demonstrated transfer with cuing from therapist to not hold to car door as this will move and pt could lose balance. Pt returned to unit via w/c total A & gait training x 50 ft with RW & min A with cuing to increase step length & width but pt with fair carryover. At  end of session pt left sitting in w/c in room with all needs within reach & family present.   Therapy Documentation Precautions:  Precautions Precautions: Back Precaution Comments: unable to recall 0/3 back precautions.  Required Braces or Orthoses: Spinal Brace Spinal Brace: Applied in sitting position Restrictions Weight Bearing Restrictions: Yes RLE Weight Bearing: Non weight bearing LLE Weight Bearing: Weight bearing as tolerated Other Position/Activity Restrictions: CAM boot for RLE due to ankle fx, shoe for left foot  See Function Navigator for Current Functional Status.   Therapy/Group: Individual  Therapy  [name] 11/17/2015, 7:46 AM

## 2015-11-17 NOTE — Progress Notes (Signed)
Occupational Therapy Session Note  Patient Details  Name: Mitchell [name] MRN: [id number] Date of Birth: 06-13-1934  Today's Date: 11/17/2015 OT Individual Time: [id number] OT Individual Time Calculation (min): 60 min     Short Term Goals: Week 3:  OT Short Term Goal 1 (Week 3): STGs = LTGs  Skilled Therapeutic Interventions/Progress Updates:   Pt was up in w/c with wife upon skilled OT arrival. Pt was agreeable to complete BADLs w/c level standing as needed at sink. During UB dressing, pt required Mod A for donning back brace due to UE limitations. Wife demonstrated orthosis donning with supervision with education on ADL sequencing to ensure orthosis was donned during standing tasks. Pts wife Lelon Frohlich) verbalized understanding.  Min A required for washing back thoroughly and Min A required for steadying assistance while pt pulled pants over hips. Pt was able to maintain balance without hand support during LB dressing today.Pt required min vcs for proper use of AE with pt assisting with adhering AFO strap with reacher. 3/3 back precautions recalled with vision cue on wall. Pts TED stockings appeared to be stretched out. Nursing notified and reported that 2 new sets of TED stockings would arrive today. Pt and wife notified. Wife assisted therapist with donning TEDs in room during therapy. At end of session pt left in w/c with wife and all needs within reach.  Therapy Documentation Precautions:  Precautions Precautions: Back Precaution Comments: unable to recall 0/3 back precautions.  Required Braces or Orthoses: Spinal Brace Spinal Brace: Applied in sitting position Restrictions Weight Bearing Restrictions: Yes RLE Weight Bearing: Weight bearing as tolerated LLE Weight Bearing: Weight bearing as tolerated Other Position/Activity Restrictions: CAM boot for RLE due to ankle fx, shoe for left foot General:   Vital Signs:   Pain: Pt reported pain to be manageable with provided rest  breaks/diaphragmatic breathing   Pain Assessment Pain Assessment: 0-10 Pain Score: 3  Pain Type: Acute pain Pain Location: Back Pain Orientation: Mid;Lower Pain Descriptors / Indicators: Aching Pain Frequency: Intermittent Pain Onset: With Activity Patients Stated Pain Goal: 3 Pain Intervention(s): Medication (See eMAR);Repositioned (tylenol 650mg  po) ADL: ADL ADL Comments: Refer to functional navigator :    See Function Navigator for Current Functional Status.   Therapy/Group: Individual Therapy  Kelvyn Schunk A Dontrae Morini 11/17/2015, 12:46 PM

## 2015-11-18 ENCOUNTER — Inpatient Hospital Stay (HOSPITAL_COMMUNITY): Payer: Medicare Other | Admitting: Occupational Therapy

## 2015-11-18 ENCOUNTER — Inpatient Hospital Stay (HOSPITAL_COMMUNITY): Payer: Medicare Other

## 2015-11-18 ENCOUNTER — Inpatient Hospital Stay (HOSPITAL_COMMUNITY): Payer: Medicare Other | Admitting: Physical Therapy

## 2015-11-18 NOTE — Progress Notes (Signed)
Occupational Therapy Session Note  Patient Details  Name: Mitchell RITTHALER MRN: [id number] Date of Birth: May 19, 1934  Today's Date: 11/18/2015 OT Individual Time: 1000-1100 OT Individual Time Calculation (min): 60 min     Short Term Goals: Week 3:  OT Short Term Goal 1 (Week 3): STGs = LTGs  Skilled Therapeutic Interventions/Progress Updates:    Pt sitting EOB upon arrival with wife present.  Pt stated that PT had assisted with washing and dressing.  Pt transferred to w/c and propelled to gym to practice bed mobility on 28" surface.  Pt and wife realized that his mattress at home is very soft and his feet can actually touch the floor when sitting EOB.  Surface lowered to simulate surface at home.  Pt required assistance with lifting and lowering BLE with sit<>supine on mat.  Pt returned to w/c and propelled to ADL apartment and practiced simple RW tasks in kitchen with emphasis on RW safety.  Pt propelled w/c back to room from ADL kitchen and remained in w/c with all needs within reach and wife present.  Therapy Documentation Precautions:  Precautions Precautions: Back Precaution Comments: unable to recall 0/3 back precautions.  Required Braces or Orthoses: Spinal Brace Spinal Brace: Applied in sitting position Restrictions Weight Bearing Restrictions: Yes RLE Weight Bearing: Weight bearing as tolerated LLE Weight Bearing: Weight bearing as tolerated Other Position/Activity Restrictions: CAM boot for RLE due to ankle fx, shoe for left foot Pain:  Pt denied pain   See Function Navigator for Current Functional Status.   Therapy/Group: Individual Therapy  [name] 11/18/2015, 11:29 AM

## 2015-11-18 NOTE — Consult Note (Signed)
WOC follow-up: Discussed plan of care with Dr Melven Sartorius office via phone; he agrees with dressing changes as discussed in the previous progress notes and patient should follow-up with the neurosurgeon after discharge. Please re-consult if further assistance is needed.  Thank-you,  Julien Girt MSN, [place], [place], [place], [place] and [place]

## 2015-11-18 NOTE — Progress Notes (Signed)
Occupational Therapy Session Note  Patient Details  Name: Mitchell [name] MRN: [id number] Date of Birth: 11-14-1934  Today's Date: 11/18/2015 OT Individual Time: 1330-1400 OT Individual Time Calculation (min): 30 min     Short Term Goals:Week 3:  OT Short Term Goal 1 (Week 3): STGs = LTGs  Skilled Therapeutic Interventions/Progress Updates:    Pt seen for OT session focusing on functional mobility, ambulation, ADL re-training, and education. Pt sitting up in w/c upon arrival with wife present. He voiced need for toileting task. Pt ambulated into bathroom with min A sit> stand and steadying assist to ambulate into bathroom. VCs provided for sequencing/ RW management in bathroom. He required total  A for toileting needs as pt fearful of falling and not wanting to let go of RW despite therapist's assist.  He ambulated out of bathroom and required seated rest break prior to ambulating to sink to wash hands. Discussed with pt and pt's wife planned upcoming d/c as well as home layout, specifically in the bathroom. Appears pt has all needed bathroom DME including elevated toilet, BSC, and grab bars. Pt and pt's wife voiced feeling comfortable with upcoming d/c, though are looking forward to remainder of time on CIR to cont to practice for d/c. He requested return to supine at end of session. VCs provided for log rolling technique and mod A required for management of LEs onto bed. Pt left in supine with all needs in reach and wife present.   Therapy Documentation Precautions:  Precautions Precautions: Back Precaution Comments: unable to recall 0/3 back precautions.  Required Braces or Orthoses: Spinal Brace Spinal Brace: Applied in sitting position Restrictions Weight Bearing Restrictions: Yes RLE Weight Bearing: Weight bearing as tolerated LLE Weight Bearing: Weight bearing as tolerated Other Position/Activity Restrictions: CAM boot for RLE due to ankle fx, shoe for left foot General:   Vital  Signs: Therapy Vitals Temp: 98 F (36.7 C) Temp Source: Oral Pulse Rate: 66 Resp: 17 BP: (!) 121/54 Patient Position (if appropriate): Lying Oxygen Therapy SpO2: 97 % O2 Device: Not Delivered Pain:   ADL: ADL ADL Comments: Refer to functional navigator Exercises:   Other Treatments:    See Function Navigator for Current Functional Status.   Therapy/Group: Individual Therapy  [name], Stephan Draughn C 11/18/2015, 7:30 AM

## 2015-11-18 NOTE — Progress Notes (Signed)
Physical Therapy Session Note  Patient Details  Name: Mitchell [name] MRN: [id number] Date of Birth: 1934/12/04  Today's Date: 11/18/2015 PT Individual Time: 1530-1610 PT Individual Time Calculation (min): 40 min    Short Term Goals: Week 3:  PT Short Term Goal 1 (Week 3): STG = LTG due to estimated d/c date.   Skilled Therapeutic Interventions/Progress Updates:    Patient in bed upon arrival, reporting urgent need to urinate. Transferred supine > sitting EOB with supervision using rail with cues for technique and donned lumbar corset sitting EOB and B shoes and AFO with total A. Performed sit <> stand using RW and patient positioned and held urinal with steady assist for standing balance. Patient ambulated to sink using RW to wash hands in standing before backing up to wheelchair with min A. Patient ambulated up/down ramp using RW 2 x 40 ft with min A and increased time with seated rest between trials. Patient propelled wheelchair using BUE 2 x 75 ft with supervision and extra time. Patient's wife present at end of session and patient left sitting in wheelchair with wife and needs in reach.   Therapy Documentation Precautions:  Precautions Precautions: Back Precaution Comments: unable to recall 0/3 back precautions.  Required Braces or Orthoses: Spinal Brace Spinal Brace: Applied in sitting position Restrictions Weight Bearing Restrictions: Yes RLE Weight Bearing: Weight bearing as tolerated LLE Weight Bearing: Weight bearing as tolerated Other Position/Activity Restrictions: CAM boot for RLE due to ankle fx, shoe for left foot Pain: Pain Assessment Pain Assessment: No/denies pain    See Function Navigator for Current Functional Status.   Therapy/Group: Individual Therapy  Laretta Alstrom 11/18/2015, 4:13 PM

## 2015-11-18 NOTE — Progress Notes (Signed)
[facility] PHYSICAL MEDICINE & REHABILITATION     PROGRESS NOTE  Subjective/Complaints:  Pt laying in bed this AM.  He slept well overnight.  He is more alert and interactive this AM.     ROS: Denies CP, SOB, N/V/D.  Objective: Vital Signs: Blood pressure (!) 121/54, pulse 66, temperature 98 F (36.7 C), temperature source Oral, resp. rate 17, height 6\' 3"  (1.905 m), weight 124.8 kg (275 lb 1.6 oz), SpO2 97 %. No results found. No results for input(s): WBC, HGB, HCT, PLT in the last 72 hours. No results for input(s): NA, K, CL, GLUCOSE, BUN, CREATININE, CALCIUM in the last 72 hours.  Invalid input(s): CO CBG (last 3)  No results for input(s): GLUCAP in the last 72 hours.  Wt Readings from Last 3 Encounters:  11/10/15 124.8 kg (275 lb 1.6 oz)  10/24/15 122.5 kg (270 lb)  10/16/15 122.5 kg (270 lb)    Physical Exam:  BP (!) 121/54 (BP Location: Left Arm)   Pulse 66   Temp 98 F (36.7 C) (Oral)   Resp 17   Ht 6\' 3"  (1.905 m)   Wt 124.8 kg (275 lb 1.6 oz)   SpO2 97%   BMI 34.39 kg/m  Constitutional: He appears well-developed and well-nourished.  HENT: Normocephalic and atraumatic.  Eyes: Conjunctivae are normal. Cardiovascular: Normal rate and regular rhythm.  Respiratory: Effort normal and breath sounds normal. No stridor. No respiratory distress. He has no wheezes.  GI: Soft. Bowel sounds are normal. He exhibits no distension. There is no tenderness.  Musculoskeletal: He exhibits edema. He exhibits no tenderness.  Neurological: He is alert and oriented x1.  Follows simple commands HOH Motor: B/l UE: 4+/5 grossly proximal to distal RLE: hip flexion, knee extension 4+/5, ankle dorsi/plantarflexion 4-/5 LLE: hip flexion, knee extension 4+/5, ankle dorsi/plantar flexion 4+/5  Skin: Skin is warm and dry. No erythema. Back incision with dressing, improving drainage, with VAC.  Psychiatric: He has a normal mood and affect. Thought content normal.     Assessment/Plan: 1. Functional deficits secondary to lumbar radiculopathy L2/3 and kyphosis with L4/5 spondylolisthesis with hx of right foot drop which require 3+ hours per day of interdisciplinary therapy in a comprehensive inpatient rehab setting. Physiatrist is providing close team supervision and 24 hour management of active medical problems listed below. Physiatrist and rehab team continue to assess barriers to discharge/monitor patient progress toward functional and medical goals.  Function:  Bathing Bathing position   Position: Wheelchair/chair at sink  Bathing parts Body parts bathed by patient: Right arm, Left arm, Chest, Abdomen, Front perineal area, Buttocks, Right upper leg, Left upper leg, Right lower leg, Left lower leg Body parts bathed by helper: Back  Bathing assist Assist Level: Touching or steadying assistance(Pt > 75%)      Upper Body Dressing/Undressing Upper body dressing   What is the patient wearing?: Pull over shirt/dress, Orthosis     Pull over shirt/dress - Perfomed by patient: Thread/unthread right sleeve, Thread/unthread left sleeve, Put head through opening, Pull shirt over trunk Pull over shirt/dress - Perfomed by helper: Pull shirt over trunk Button up shirt - Perfomed by patient: Thread/unthread right sleeve, Thread/unthread left sleeve, Button/unbutton shirt Button up shirt - Perfomed by helper: Pull shirt around back Orthosis activity level: Performed by helper  Upper body assist Assist Level: Set up   Set up : To obtain clothing/put away  Lower Body Dressing/Undressing Lower body dressing   What is the patient wearing?: Pants, Underwear, [employer],  AFO, Shoes Underwear - Performed by patient: Thread/unthread right underwear leg, Thread/unthread left underwear leg, Pull underwear up/down Underwear - Performed by helper: Pull underwear up/down Pants- Performed by patient: Thread/unthread right pants leg, Thread/unthread left pants leg, Pull  pants up/down Pants- Performed by helper: Pull pants up/down, Thread/unthread right pants leg, Thread/unthread left pants leg           Shoes - Performed by helper: Don/doff right shoe, Don/doff left shoe AFO - Performed by patient: Don/doff right AFO     TED Hose - Performed by helper: Don/doff right TED hose, Don/doff left TED hose  Lower body assist Assist for lower body dressing: Touching or steadying assistance (Pt > 75%)      Toileting Toileting Toileting activity did not occur: No continent bowel/bladder event Toileting steps completed by patient: Adjust clothing prior to toileting, Performs perineal hygiene Toileting steps completed by helper: Adjust clothing prior to toileting, Performs perineal hygiene, Adjust clothing after toileting Toileting Assistive Devices: Grab bar or rail  Toileting assist Assist level: More than reasonable time, Touching or steadying assistance (Pt.75%)   Transfers Chair/bed transfer   Chair/bed transfer method: Ambulatory Chair/bed transfer assist level: Touching or steadying assistance (Pt > 75%) Chair/bed transfer assistive device: Environmental consultant, Orthosis     Locomotion Ambulation     Max distance: 100 ft  Assist level: Touching or steadying assistance (Pt > 75%)   Wheelchair   Type: Manual Max wheelchair distance: 164ft Assist Level: Supervision or verbal cues  Cognition Comprehension Comprehension assist level: Understands basic 90% of the time/cues < 10% of the time (hard of hearing )  Expression Expression assist level: Expresses basic 75 - 89% of the time/requires cueing 10 - 24% of the time. Needs helper to occlude trach/needs to repeat words.  Social Interaction Social Interaction assist level: Interacts appropriately 90% of the time - Needs monitoring or encouragement for participation or interaction.  Problem Solving Problem solving assist level: Solves basic 75 - 89% of the time/requires cueing 10 - 24% of the time  Memory Memory  assist level: Recognizes or recalls 50 - 74% of the time/requires cueing 25 - 49% of the time     Medical Problem List and Plan: 1. Gait and cognitive deficits with weakness secondary to lumbar radiculopathy L2/3 and kyphosis with L4/5 spondylolisthesis with hx of right foot drop  Cont CIR therapies 2. DVT Prophylaxis/Anticoagulation: Pharmaceutical: Lovenox added as 5 days post op.  3. Pain Management:  Ultram qid for now, d/ced on 7/19 due to AMS   Increased tylenol on 7/24, changed to 500mg  4/day PRN 4. Mood: LCSW to follow for evaluation and support.  5. Neuropsych: This patient is not capable of making decisions on his own behalf.  Klonipin d/ced  Seroquel 12.5 qhs d/ced on 7/26  Trazodone 25mg  PRN, pt medication sensitive  CT head from 7/20 reviewed, no acute changes, small vessel disease  Ammonia normal 6. Skin/Wound Care: Monitor wound for healing. Maintain adequate nutrition and hydration status.   Surgical wound infection. Appreciate Neurosurg and ID recs. (improving)   IV Abx, now on PO Levoquin.    VAC with minimal drainage, will plan to d/c 7. Fluids/Electrolytes/Nutrition: Monitor I/O.   Hyponatremia: Resolved 136 on 7/28 8. HTN: Monitor BP bid. On amlodipine and atenolol.  9. Ureteral stricture: Currently voiding without difficulty Monitor I/O.  No I/O Cath, per Urology due to potential surgery and risk for further injury.   -condom cath HS 10. Ankle fracture: Xray reviewed on 7/25  with no healing of fracture.  Per Ortho, d/c CAM boot, WBAT. 11. Depression/Anxiety: Cymbalta d/ced due to ?side effects, although many other medications and infection also concomitant  12. GERD: On protonix.  13. Asthma: Encourage IS. On dulera for hyperactive airway.  14. Constipation: Increased miralax to bid. 15. CAD: Cont meds 16. Obesity: Body mass index is 33.75 kg/(m^2) on admission, diet and exercise education, encourage weight loss to increase endurance  and promote overall health 17. Right ankle fracture: Cont boot 18. Chronic back pain: With severe central canal stenosis and foraminal narrowing: Cont meds 19. Right RTC tear and DJD right shoulder: Cont meds 20. Cognitive deficits with difficulty processing: Likely secondary to narcotics +/- infection. 21. Leukocytosis: Resolved  Wound cx with Pseudomonas and Klebsiella, Levaquin stopped 7/27  WBCs 8.2 on 7/28  Abx d/ced per ID  UA neg, Ucx multiple species 22. ABLA  Hb 10.2 on 7/28  Cont to monitor 13. Confusion: Improving.   ?Baseline dementia   Likely secondary to infection +/- medications  Will cont to monitor, medications also reviewed, pain and sleep medications adjusted. 14. Nocturnal urinary incontinence  Cont condom cath to avoid nighttime awakenings and improve sleep  LOS (Days) 20 A FACE TO FACE EVALUATION WAS PERFORMED  Nohelia Valenza [name] 11/18/2015 9:24 AM

## 2015-11-18 NOTE — Consult Note (Addendum)
WOC follow-up: Pt had incisional Vac dressing applied to post-op back incision last week.  This has been in place for 5 days and is due to be removed.  Pt plans to discharge later this week and incisional Vacs are not approved through insurance after discharge.  Assessed back wounds and attempted to call neurosurgery office to discuss plan of care; left message for call back.  There are 2 full thickness wounds over previous surgical site; located at 12:00 o'clock and 6:00 o'clock on the wound: .8X.1X.2cm and 2X.2X.2cm.  Both are 50% red, 50% yellow, with scant amt yellow drainage, no odor. Applied Silver post-op dressing to provide antimicrobial benefits and absorb drainage to promote healing.  These should be changed Q 5 days or PRN strike through drainage. 3 extra dressings given to the patient in his room for use/take home. Discussed plan of care with patient and wife at bedside.  If there is an increase in drainage or change in appearance, then they should call neurosurgery; Dr Melven Sartorius office.  They state they will obtain a follow-up appointment with him after discharge so he can continue to follow the wound post-op. Please re-consult if further assistance is needed.  Thank-you,  Julien Girt MSN, [place], [place], [place], [place]

## 2015-11-18 NOTE — Progress Notes (Signed)
Physical Therapy Session Note  Patient Details  Name: Mitchell [name] MRN: [id number] Date of Birth: May 17, 1934  Today's Date: 11/18/2015 PT Individual Time: 0905-1000 PT Individual Time Calculation (min): 55 min    Short Term Goals: Week 1:  PT Short Term Goal 1 (Week 1): Patient will consistent perform stand pivot transfer and sit<>stand with Mod A and RW.  PT Short Term Goal 1 - Progress (Week 1): Partly met PT Short Term Goal 2 (Week 1): Patient will performed bed mobility with min A.  PT Short Term Goal 2 - Progress (Week 1): Progressing toward goal PT Short Term Goal 3 (Week 1): Patient will  ambulate 26f with Mod A from PT with RW  PT Short Term Goal 3 - Progress (Week 1): Met PT Short Term Goal 4 (Week 1): Patient will perform car transfer with max A and RW.  PT Short Term Goal 4 - Progress (Week 1): Met  Skilled Therapeutic Interventions/Progress Updates:    Pt received in bed & agreeable to PT, noting 3/10 back pain. Pt & wife performed bed mobility, supine>log rolling>sitting EOB with moderate cuing to bend B knees up and roll as one unit from PT. Pt able to complete sit<>Stand transfers with steady A/supervision with maximum cuing for hand placement as pt unable to recall technique. Pt required max A to don old underwear, thread new underwear & pants & complete donning them with pt in standing. Discussed home set up as pt returned home measurement sheet. Pt has very narrow bathrooms, and doorways to both bathrooms are 24" wide. Educated pt & wife on need to ambulate laterally to access bathrooms. Practiced this task with pt ambulating sideways between poles and pt able to do so with min A with 1 loss of balance posteriorly. Educated pt & wife on reduced safety of this as wife is unable to stand behind pt & provide assistance if pt were to lose balance & wife agreeable. Pt and wife plan to use BSC upon d/c. Educated pt & wife on HHPT following d/c as well. Discussed pt receiving his  personal AFO this afternoon, as well as need to perform skin checks to reduce risk of skin breakdown & wife voiced understanding. At end of session pt left sitting on EOB with all needs within reach & wife present to supervise.   Therapy Documentation Precautions:  Precautions Precautions: Back Precaution Comments: unable to recall 0/3 back precautions.  Required Braces or Orthoses: Spinal Brace Spinal Brace: Applied in sitting position Restrictions Weight Bearing Restrictions: Yes RLE Weight Bearing: Weight bearing as tolerated LLE Weight Bearing: Weight bearing as tolerated Other Position/Activity Restrictions: CAM boot for RLE due to ankle fx, shoe for left foot  Pain: Pain Assessment Pain Assessment: 0-10 Pain Score: 3  Pain Location: Back Pain Intervention(s): Distraction   See Function Navigator for Current Functional Status.   Therapy/Group: Individual Therapy  VWaunita Schooner8/04/2015, 12:33 PM

## 2015-11-19 ENCOUNTER — Inpatient Hospital Stay (HOSPITAL_COMMUNITY): Payer: Medicare Other

## 2015-11-19 ENCOUNTER — Inpatient Hospital Stay (HOSPITAL_COMMUNITY): Payer: Medicare Other | Admitting: Physical Therapy

## 2015-11-19 LAB — CREATININE, SERUM
Creatinine, Ser: 0.58 mg/dL — ABNORMAL LOW (ref 0.61–1.24)
GFR calc Af Amer: >60 mL/min (ref >60)
GFR calc non Af Amer: >60 mL/min (ref >60)

## 2015-11-19 NOTE — Progress Notes (Signed)
[place] PHYSICAL MEDICINE & REHABILITATION     PROGRESS NOTE  Subjective/Complaints:  Pt laying in bed this AM.  He is easily arousable.  Oriented x1, but wife insists he was AOx3 30 minutes prior.  He states he slept well.    ROS: Denies CP, SOB, N/V/D.  Objective: Vital Signs: Blood pressure 130/67, pulse 62, temperature 97.8 F (36.6 C), temperature source Oral, resp. rate 16, height 6\' 3"  (1.905 m), weight 124.8 kg (275 lb 1.6 oz), SpO2 96 %. No results found. No results for input(s): WBC, HGB, HCT, PLT in the last 72 hours.  Recent Labs  11/19/15 0637  CREATININE 0.58*   CBG (last 3)  No results for input(s): GLUCAP in the last 72 hours.  Wt Readings from Last 3 Encounters:  11/10/15 124.8 kg (275 lb 1.6 oz)  10/24/15 122.5 kg (270 lb)  10/16/15 122.5 kg (270 lb)    Physical Exam:  BP 130/67   Pulse 62   Temp 97.8 F (36.6 C) (Oral)   Resp 16   Ht 6\' 3"  (1.905 m)   Wt 124.8 kg (275 lb 1.6 oz)   SpO2 96%   BMI 34.39 kg/m  Constitutional: He appears well-developed and well-nourished.  HENT: Normocephalic and atraumatic.  Eyes: Conjunctivae are normal. Cardiovascular: Normal rate and regular rhythm.  Respiratory: Effort normal and breath sounds normal. No stridor. No respiratory distress. He has no wheezes.  GI: Soft. Bowel sounds are normal. He exhibits no distension. There is no tenderness.  Musculoskeletal: He exhibits min edema. He exhibits no tenderness.  Neurological: He is alert and oriented x1.  Follows simple commands HOH Motor: B/l UE: 4+/5 grossly proximal to distal RLE: hip flexion, knee extension 4+/5, ankle dorsi/plantarflexion 4-/5 LLE: hip flexion, knee extension 4+/5, ankle dorsi/plantar flexion 4+/5  Skin: Skin is warm and dry. No erythema. Back incision with surgical dressing back on c/d/i.  Psychiatric: He has a normal mood and affect. Thought content normal.    Assessment/Plan: 1. Functional deficits secondary to lumbar  radiculopathy L2/3 and kyphosis with L4/5 spondylolisthesis with hx of right foot drop which require 3+ hours per day of interdisciplinary therapy in a comprehensive inpatient rehab setting. Physiatrist is providing close team supervision and 24 hour management of active medical problems listed below. Physiatrist and rehab team continue to assess barriers to discharge/monitor patient progress toward functional and medical goals.  Function:  Bathing Bathing position   Position: Wheelchair/chair at sink  Bathing parts Body parts bathed by patient: Right arm, Left arm, Chest, Abdomen, Front perineal area, Buttocks, Right upper leg, Left upper leg, Right lower leg, Left lower leg Body parts bathed by helper: Back  Bathing assist Assist Level: Touching or steadying assistance(Pt > 75%)      Upper Body Dressing/Undressing Upper body dressing   What is the patient wearing?: Pull over shirt/dress, Orthosis     Pull over shirt/dress - Perfomed by patient: Thread/unthread right sleeve, Thread/unthread left sleeve, Put head through opening, Pull shirt over trunk Pull over shirt/dress - Perfomed by helper: Pull shirt over trunk Button up shirt - Perfomed by patient: Thread/unthread right sleeve, Thread/unthread left sleeve, Button/unbutton shirt Button up shirt - Perfomed by helper: Pull shirt around back Orthosis activity level: Performed by helper  Upper body assist Assist Level: Set up   Set up : To obtain clothing/put away  Lower Body Dressing/Undressing Lower body dressing   What is the patient wearing?: Pants, Underwear, Ted Hose, AFO, Shoes Underwear - Performed by  patient: Thread/unthread right underwear leg, Thread/unthread left underwear leg, Pull underwear up/down Underwear - Performed by helper: Pull underwear up/down Pants- Performed by patient: Thread/unthread right pants leg, Thread/unthread left pants leg, Pull pants up/down Pants- Performed by helper: Pull pants up/down,  Thread/unthread right pants leg, Thread/unthread left pants leg           Shoes - Performed by helper: Don/doff right shoe, Don/doff left shoe AFO - Performed by patient: Don/doff right AFO     TED Hose - Performed by helper: Don/doff right TED hose, Don/doff left TED hose  Lower body assist Assist for lower body dressing: Touching or steadying assistance (Pt > 75%)      Toileting Toileting Toileting activity did not occur: No continent bowel/bladder event Toileting steps completed by patient: Adjust clothing prior to toileting, Performs perineal hygiene Toileting steps completed by helper: Adjust clothing after toileting Toileting Assistive Devices: Grab bar or rail  Toileting assist Assist level: Touching or steadying assistance (Pt.75%)   Transfers Chair/bed transfer   Chair/bed transfer method: Ambulatory Chair/bed transfer assist level: Touching or steadying assistance (Pt > 75%) Chair/bed transfer assistive device: Walker, Orthosis, Armrests     Locomotion Ambulation     Max distance: 40 ft Assist level: Touching or steadying assistance (Pt > 75%)   Wheelchair   Type: Manual Max wheelchair distance: 75 ft Assist Level: Supervision or verbal cues  Cognition Comprehension Comprehension assist level: Understands basic 75 - 89% of the time/ requires cueing 10 - 24% of the time  Expression Expression assist level: Expresses basic 75 - 89% of the time/requires cueing 10 - 24% of the time. Needs helper to occlude trach/needs to repeat words.  Social Interaction Social Interaction assist level: Interacts appropriately 90% of the time - Needs monitoring or encouragement for participation or interaction.  Problem Solving Problem solving assist level: Solves basic 50 - 74% of the time/requires cueing 25 - 49% of the time  Memory Memory assist level: Recognizes or recalls less than 25% of the time/requires cueing greater than 75% of the time     Medical Problem List and  Plan: 1. Gait and cognitive deficits with weakness secondary to lumbar radiculopathy L2/3 and kyphosis with L4/5 spondylolisthesis with hx of right foot drop  Cont CIR therapies 2. DVT Prophylaxis/Anticoagulation: Pharmaceutical: Lovenox added as 5 days post op.  3. Pain Management:  Ultram qid for now, d/ced on 7/19 due to AMS   Increased tylenol on 7/24, changed to 500mg  4/day PRN 4. Mood: LCSW to follow for evaluation and support.  5. Neuropsych: This patient is not capable of making decisions on his own behalf.  Klonipin d/ced  Seroquel 12.5 qhs d/ced on 7/26  Trazodone 25mg  PRN, pt medication sensitive  CT head from 7/20 reviewed, no acute changes, small vessel disease  Ammonia normal 6. Skin/Wound Care: Monitor wound for healing. Maintain adequate nutrition and hydration status.   Surgical wound infection. Appreciate Neurosurg and ID recs. (improving)   Cont surgical dressing 7. Fluids/Electrolytes/Nutrition: Monitor I/O.   Hyponatremia: Resolved 136 on 7/28 8. HTN: Monitor BP bid. On amlodipine and atenolol.  9. Ureteral stricture: Currently voiding without difficulty Monitor I/O.  No I/O Cath, per Urology due to potential surgery and risk for further injury.   -condom cath HS 10. Ankle fracture: Xray reviewed on 7/25 with no healing of fracture.  Per Ortho, d/c CAM boot, WBAT. 11. Depression/Anxiety: Cymbalta d/ced due to ?side effects, although many other medications and infection also concomitant  12.  GERD: On protonix.  13. Asthma: Encourage IS. On dulera for hyperactive airway.  14. Constipation: Increased miralax to bid. 15. CAD: Cont meds 16. Obesity: Body mass index is 33.75 kg/(m^2) on admission, diet and exercise education, encourage weight loss to increase endurance and promote overall health 17. Right ankle fracture: Cont boot 18. Chronic back pain: With severe central canal stenosis and foraminal narrowing: Cont meds 19. Right RTC tear and  DJD right shoulder: Cont meds 20. Cognitive deficits with difficulty processing: Likely secondary to narcotics +/- infection. 21. Leukocytosis: Resolved  Wound cx with Pseudomonas and Klebsiella, Levaquin stopped 7/27  WBCs 8.2 on 7/28  Abx d/ced per ID  UA neg, Ucx multiple species 22. ABLA  Hb 10.2 on 7/28  Cont to monitor 13. Confusion: Improving.   ?Baseline dementia   Likely secondary to infection +/- medications  Will cont to monitor, medications also reviewed, pain and sleep medications adjusted. 14. Nocturnal urinary incontinence  Cont condom cath to avoid nighttime awakenings and improve sleep  LOS (Days) 21 A FACE TO FACE EVALUATION WAS PERFORMED  Mitchell [name] 11/19/2015 9:07 AM

## 2015-11-19 NOTE — Progress Notes (Signed)
Physical Therapy Session Note  Patient Details  Name: Mitchell [name] MRN: [id number] Date of Birth: 12/23/1934  Today's Date: 11/19/2015 PT Individual Time: [id number] PT Individual Time Calculation (min): 68 min    Short Term Goals: Week 3:  PT Short Term Goal 1 (Week 3): STG = LTG due to estimated d/c date.   Skilled Therapeutic Interventions/Progress Updates:    Pt received in w/c & agreeable to PT, noting 3/10 back pain & RN made aware. [employer] rights manager) present & pt trialed PLS AFO & new AFO. Gait training x 100 ft + 100 ft + 100 ft with RW & Min A for balance. Pt continues to pronate RLE but with less genu recurvatum with new AFO. Determined that pt will benefit from new AFO, T strap, and heel wedge. Educated pt & wife on performing skin checks to RLE, how to adjust wear time, and how to don brace. Pt noted fatigue after 3 gait trials. Discussed HHPT versus OPPT with pt & wife. This therapist recommends that pt receive HHPT then transition to OPPT later to ensure pt is safe in home and to provide recommendations in home environment. Pt's wife would rather pt attend OPPT. Pt requesting to go back to bed & pt & wife performed w/c>bed transfer via stand pivot with RW & supervision from therapist. Educated pt & wife to always use RW for transfers & ambulation. Pt able to transfer sit>supine with Mod A from wife to raise BLE onto bed. Pt continues to require maximum cuing for sequencing to log roll & maintain back precautions. Pt rolled in bed with steady A with bed rails to doff brace as pt's wife forgot to doff it prior to transferring sit>supine. Pt performed BLE adduction squeezes with pillow. At end of session pt left in bed with all needs within reach, BLE elevated on pillows & wife present.   Therapy Documentation Precautions:  Precautions Precautions: Back Precaution Comments: unable to recall 0/3 back precautions.  Required Braces or Orthoses: Spinal Brace Spinal Brace:  Applied in sitting position Restrictions Weight Bearing Restrictions: Yes RLE Weight Bearing: Weight bearing as tolerated LLE Weight Bearing: Weight bearing as tolerated Other Position/Activity Restrictions: CAM boot for RLE due to ankle fx, shoe for left foot  Pain: Pain Assessment Pain Assessment: 0-10 Pain Score: 3  Pain Location: Back Pain Intervention(s): RN made aware   See Function Navigator for Current Functional Status.   Therapy/Group: Individual Therapy  [name] 11/19/2015, 2:07 PM

## 2015-11-19 NOTE — Progress Notes (Signed)
Social Work Elease Hashimoto, LCSW Social Worker Signed   Patient Care Conference Date of Service: 11/19/2015  2:57 PM      Hide copied text Hover for attribution information Inpatient RehabilitationTeam Conference and Plan of Care Update Date: 11/19/2015   Time: 10:50 AM      Patient Name: Mitchell [name]      Medical Record Number: [id number]  Date of Birth: 21-Aug-1934 Sex: Male         Room/Bed: 4W12C/4W12C-01 Payor Info: Payor: MEDICARE / Plan: MEDICARE PART A AND B / Product Type: *No Product type* /     Admitting Diagnosis: Lumbar Fusion  Admit Date/Time:  10/29/2015  6:03 PM Admission Comments: No comment available    Primary Diagnosis:  Intervertebral disc disorder with radiculopathy of lumbar region Principal Problem: Intervertebral disc disorder with radiculopathy of lumbar region       Patient Active Problem List    Diagnosis Date Noted  . Wound drainage    . Nocturnal enuresis    . Fracture    . Confusion    . Sleep disturbance    . Agitation    . Surgical wound infection    . Debility    . Postoperative confusion    . Surgical wound dehiscence    . Hyponatremia    . Leukocytosis    . Acute blood loss anemia    . Abnormality of gait    . Intervertebral disc disorder with radiculopathy of lumbar region 10/29/2015  . Benign essential HTN    . Adjustment disorder with mixed anxiety and depressed mood    . Gastroesophageal reflux disease without esophagitis    . Asthma    . Chronic back pain    . Surgery, other elective    . Coronary artery disease involving native coronary artery of native heart without angina pectoris    . Atrial flutter ([place])    . Obesity    . Ureteral stricture    . Right foot drop    . Ankle fracture, right    . Osteoarthritis of spine with radiculopathy, lumbosacral region    . Rotator cuff tear    . Primary osteoarthritis of right shoulder    . Post-operative pain    . Cognitive deficits    . Lumbar stenosis with neurogenic  claudication 10/24/2015  . Status post laminectomy with spinal fusion 10/24/2015  . Atrial flutter with RVR- s/p TEE CV Feb 2013 05/25/2011  . Anticoagulant causing adverse effect in therapeutic use 05/25/2011  . LV dysfunction, EF 45% in AF- >55% May 2013 in NSR 05/25/2011  . CHF, acute, mild secondary to EF 40-45% an a. flutter with RVR 05/25/2011  . Erectile dysfunction 05/16/2011  . Low testosterone 05/16/2011  . BPH (benign prostatic hyperplasia) 05/16/2011  . History of recurrent urinary tract infection 05/16/2011  . Spinal stenosis 05/16/2011  . HTN (hypertension), 09/17/2010  . Hyperlipidemia 09/17/2010  . CAD- BMS to PLA 2001, 70% mid LAD, 50-60% prox. LCX, 60% PDA- Low risk Myoview Jan 2013 09/17/2010  . Morbid obesity ([place]) 09/17/2010  . Sleep apnea, intolerant to cpap 09/17/2010  . Pituitary microadenoma with hyperprolactinemia ([place]) 09/17/2010  . Asthma 09/17/2010  . Osteoarthritis 09/17/2010  . History of melanoma in situ 09/17/2010  . Hx of adenomatous colonic polyps 09/17/2010      Expected Discharge Date: Expected Discharge Date: 11/22/15   Team Members Present: Physician leading conference: Dr. Delice Lesch Social Worker Present: Ovidio Kin, LCSW Nurse Present: Pryor Montes  Ishmael Holter, RN PT Present: Lavone Nian, Dustin Folks, PT OT Present: Simonne Come, OT SLP Present: Gunnar Fusi, SLP PPS Coordinator present : Ileana Ladd, PT       Current Status/Progress Goal Weekly Team Focus  Medical   Gait and cognitive deficits with weakness secondary to lumbar radiculopathy L2/3 and kyphosis with L4/5 spondylolisthesis with hx of right foot drop and confusion  Improve mobility, cognition likely at baseline, improve transfers, safety  See above   Bowel/Bladder   cont x2 with accidents. condom cath HS. LBM: 7/31  cont x 2  promote regular BM, and monitor for incontinence    Swallow/Nutrition/ Hydration             ADL's   Min assist bathing and dressing,  intermittent mod assist with LB dressing and assist to don TLSO,   min A with LB self care, supervision with ADL transfers  ADL retraining with AE, pt/family education, dynamic standing balance/endurance, functional transfers   Mobility   mod A for bed mobility (sit>supine) in regular bed, min A for sit<>Stand & stand pivot transfers with RW, min A for ambulation with RW up to 160 ft, unable to recall back precautions & basic safety techniques  min A for standing balance with LRAD, min A for ambulation with LRAD, min A for transfers  family training/education & d/c planning, gait training, stair training, balance, endurance, safety awareness   Communication             Safety/Cognition/ Behavioral Observations           Pain   tylenol for back pain. Wife said Robaxin makes him sick and she doesnt want him to have that anymore   <3  aseess and treat qshift and PRN    Skin   low back incision with minimal drainage to the silver post op dressing   no further skin breakdown/infection while on rehab   assess q shif and PRN        *See Care Plan and progress notes for long and short-term goals.   Barriers to Discharge: Wound drainage, safety, transfesr, ABLA, confusion, ankle fracture, nocturnal incontinence   Possible Resolutions to Barriers:  Abx completed, cognition likely at baseline, therapies, condom cath at night   Discharge Planning/Teaching Needs:  Wife here daily and participating in therapies with pt. Extended stay due to reaching goals and medical issues. Wound Vac DC yesterday 8/1.      Team Discussion:  Progressing toward his goals,much clearer cognitively Wife here doing hands on care. AFO for foot drop provides stability for leg and foot. Condom cath at night does well doing the day.medically stable-chronic medical issues. Vac DC yesterday.  Revisions to Treatment Plan:  Dc 8/5    Continued Need for Acute Rehabilitation Level of Care: The patient requires daily medical management  by a physician with specialized training in physical medicine and rehabilitation for the following conditions: Daily direction of a multidisciplinary physical rehabilitation program to ensure safe treatment while eliciting the highest outcome that is of practical value to the patient.: Yes Daily medical management of patient stability for increased activity during participation in an intensive rehabilitation regime.: Yes Daily analysis of laboratory values and/or radiology reports with any subsequent need for medication adjustment of medical intervention for : Post surgical problems;Neurological problems;Wound care problems;Mood/behavior problems;Urological problems   Elease Hashimoto 11/19/2015, 2:57 PM       Patient ID: Mitchell [name], male   DOB: [birth year], 81 y.o.  MRN: [id number]

## 2015-11-19 NOTE — Patient Care Conference (Signed)
Inpatient RehabilitationTeam Conference and Plan of Care Update Date: 11/19/2015   Time: 10:50 AM    Patient Name: Mitchell [name]      Medical Record Number: [id number]  Date of Birth: 1934-08-31 Sex: Male         Room/Bed: 4W12C/4W12C-01 Payor Info: Payor: MEDICARE / Plan: MEDICARE PART A AND B / Product Type: *No Product type* /    Admitting Diagnosis: Lumbar Fusion  Admit Date/Time:  10/29/2015  6:03 PM Admission Comments: No comment available   Primary Diagnosis:  Intervertebral disc disorder with radiculopathy of lumbar region Principal Problem: Intervertebral disc disorder with radiculopathy of lumbar region  Patient Active Problem List   Diagnosis Date Noted  . Wound drainage   . Nocturnal enuresis   . Fracture   . Confusion   . Sleep disturbance   . Agitation   . Surgical wound infection   . Debility   . Postoperative confusion   . Surgical wound dehiscence   . Hyponatremia   . Leukocytosis   . Acute blood loss anemia   . Abnormality of gait   . Intervertebral disc disorder with radiculopathy of lumbar region 10/29/2015  . Benign essential HTN   . Adjustment disorder with mixed anxiety and depressed mood   . Gastroesophageal reflux disease without esophagitis   . Asthma   . Chronic back pain   . Surgery, other elective   . Coronary artery disease involving native coronary artery of native heart without angina pectoris   . Atrial flutter ([place])   . Obesity   . Ureteral stricture   . Right foot drop   . Ankle fracture, right   . Osteoarthritis of spine with radiculopathy, lumbosacral region   . Rotator cuff tear   . Primary osteoarthritis of right shoulder   . Post-operative pain   . Cognitive deficits   . Lumbar stenosis with neurogenic claudication 10/24/2015  . Status post laminectomy with spinal fusion 10/24/2015  . Atrial flutter with RVR- s/p TEE CV Feb 2013 05/25/2011  . Anticoagulant causing adverse effect in therapeutic use 05/25/2011  . LV  dysfunction, EF 45% in AF- >55% May 2013 in NSR 05/25/2011  . CHF, acute, mild secondary to EF 40-45% an a. flutter with RVR 05/25/2011  . Erectile dysfunction 05/16/2011  . Low testosterone 05/16/2011  . BPH (benign prostatic hyperplasia) 05/16/2011  . History of recurrent urinary tract infection 05/16/2011  . Spinal stenosis 05/16/2011  . HTN (hypertension), 09/17/2010  . Hyperlipidemia 09/17/2010  . CAD- BMS to PLA 2001, 70% mid LAD, 50-60% prox. LCX, 60% PDA- Low risk Myoview Jan 2013 09/17/2010  . Morbid obesity ([place]) 09/17/2010  . Sleep apnea, intolerant to cpap 09/17/2010  . Pituitary microadenoma with hyperprolactinemia ([place]) 09/17/2010  . Asthma 09/17/2010  . Osteoarthritis 09/17/2010  . History of melanoma in situ 09/17/2010  . Hx of adenomatous colonic polyps 09/17/2010    Expected Discharge Date: Expected Discharge Date: 11/22/15  Team Members Present: Physician leading conference: Dr. Delice Lesch Social Worker Present: Ovidio Kin, LCSW Nurse Present: Junius Creamer, RN PT Present: Lavone Nian, Dustin Folks, PT OT Present: Simonne Come, OT SLP Present: Gunnar Fusi, SLP PPS Coordinator present : Ileana Ladd, PT     Current Status/Progress Goal Weekly Team Focus  Medical   Gait and cognitive deficits with weakness secondary to lumbar radiculopathy L2/3 and kyphosis with L4/5 spondylolisthesis with hx of right foot drop and confusion  Improve mobility, cognition likely at baseline, improve transfers, safety  See above  Bowel/Bladder   cont x2 with accidents. condom cath HS. LBM: 7/31  cont x 2  promote regular BM, and monitor for incontinence    Swallow/Nutrition/ Hydration             ADL's   Min assist bathing and dressing, intermittent mod assist with LB dressing and assist to don TLSO,   min A with LB self care, supervision with ADL transfers  ADL retraining with AE, pt/family education, dynamic standing balance/endurance, functional transfers    Mobility   mod A for bed mobility (sit>supine) in regular bed, min A for sit<>Stand & stand pivot transfers with RW, min A for ambulation with RW up to 160 ft, unable to recall back precautions & basic safety techniques  min A for standing balance with LRAD, min A for ambulation with LRAD, min A for transfers  family training/education & d/c planning, gait training, stair training, balance, endurance, safety awareness   Communication             Safety/Cognition/ Behavioral Observations            Pain   tylenol for back pain. Wife said Robaxin makes him sick and she doesnt want him to have that anymore   <3  aseess and treat qshift and PRN    Skin   low back incision with minimal drainage to the silver post op dressing   no further skin breakdown/infection while on rehab   assess q shif and PRN       *See Care Plan and progress notes for long and short-term goals.  Barriers to Discharge: Wound drainage, safety, transfesr, ABLA, confusion, ankle fracture, nocturnal incontinence    Possible Resolutions to Barriers:  Abx completed, cognition likely at baseline, therapies, condom cath at night    Discharge Planning/Teaching Needs:  Wife here daily and participating in therapies with pt. Extended stay due to reaching goals and medical issues. Wound Vac DC yesterday 8/1.      Team Discussion:  Progressing toward his goals,much clearer cognitively Wife here doing hands on care. AFO for foot drop provides stability for leg and foot. Condom cath at night does well doing the day.medically stable-chronic medical issues. Vac DC yesterday.  Revisions to Treatment Plan:  Dc 8/5   Continued Need for Acute Rehabilitation Level of Care: The patient requires daily medical management by a physician with specialized training in physical medicine and rehabilitation for the following conditions: Daily direction of a multidisciplinary physical rehabilitation program to ensure safe treatment while  eliciting the highest outcome that is of practical value to the patient.: Yes Daily medical management of patient stability for increased activity during participation in an intensive rehabilitation regime.: Yes Daily analysis of laboratory values and/or radiology reports with any subsequent need for medication adjustment of medical intervention for : Post surgical problems;Neurological problems;Wound care problems;Mood/behavior problems;Urological problems  Elease Hashimoto 11/19/2015, 2:57 PM

## 2015-11-19 NOTE — Plan of Care (Signed)
Problem: SCI BLADDER ELIMINATION Goal: RH STG MANAGE BLADDER WITH ASSISTANCE STG Manage Bladder With mod I Assistance  Outcome: Not Progressing Requires condom cath at night

## 2015-11-19 NOTE — Progress Notes (Signed)
Occupational Therapy Note  Patient Details  Name: FINNIGAN ORBIN MRN: [id number] Date of Birth: [birth year]  Today's Date: 11/19/2015 OT Individual Time: 1100-1200 OT Individual Time Calculation (min): 60 min    Pt denied pain; although he stated his hands were aching after w/c mobility tasks Individual Therapy  Pt resting in w/c upon arrival with wife present.  Pt propelled w/c to therapy gym with min A.  Pt issued w/c gloves to facilitate efficiency with propelling w/c.  Pt appreciative.  Pt engaged in dynamic standing tasks including removing and replacing cards on card board.  Pt also engaged in retrieving horseshoes from elevated surface and tossing at target.  Pt retrieved with his LUE and tossed with RUE. Pt stood X 5 with extended rest breaks between tasks.  Pt performed all standing tasks at close supervision level.  Pt propelled w/c back to room and remained in w/c with all needs within reach and wife present.   Leotis Shames [facility] 11/19/2015, 12:03 PM

## 2015-11-19 NOTE — Progress Notes (Signed)
Social Work Patient ID: Mitchell [name], male   DOB: March 06, 1935, 81 y.o.   MRN: [id number]  Met with pt and wife to discuss team conference progress toward his goals and discharge still 8/5. Wife beginning family training this week. Pt clearer mentally also which is helping. Discussed follow up therapies he would like to go to the PT he has seen before wife to get the number for this worker to Follow up with.

## 2015-11-19 NOTE — Progress Notes (Signed)
Physical Therapy Session Note  Patient Details  Name: Mitchell [name] MRN: [id number] Date of Birth: 1934/10/16  Today's Date: 11/19/2015 PT Individual Time: 0915-1028 PT Individual Time Calculation (min): 73 min    Short Term Goals: Week 1:  PT Short Term Goal 1 (Week 1): Patient will consistent perform stand pivot transfer and sit<>stand with Mod A and RW.  PT Short Term Goal 1 - Progress (Week 1): Partly met PT Short Term Goal 2 (Week 1): Patient will performed bed mobility with min A.  PT Short Term Goal 2 - Progress (Week 1): Progressing toward goal PT Short Term Goal 3 (Week 1): Patient will  ambulate 62f with Mod A from PT with RW  PT Short Term Goal 3 - Progress (Week 1): Met PT Short Term Goal 4 (Week 1): Patient will perform car transfer with max A and RW.  PT Short Term Goal 4 - Progress (Week 1): Met Week 2:  PT Short Term Goal 1 (Week 2): Patient will ambulate 540fwith min A and RW.  PT Short Term Goal 1 - Progress (Week 2): Met PT Short Term Goal 2 (Week 2): Patient will perform bed mobility with min A and use of bed features.  PT Short Term Goal 2 - Progress (Week 2): Met PT Short Term Goal 3 (Week 2): Patient will perform WC mobility consistently with supervision Afor >15059fPT Short Term Goal 3 - Progress (Week 2): Met PT Short Term Goal 4 (Week 2): Patient will initiate stair training.  PT Short Term Goal 4 - Progress (Week 2): Met PT Short Term Goal 5 (Week 2): Patient will initiate stair training.  Week 3:  PT Short Term Goal 1 (Week 3): STG = LTG due to estimated d/c date.   Skilled Therapeutic Interventions/Progress Updates:   Patient received supine in bed and agreeable to PT.  Patient performed supine<>sit with min A from PT for coming to sit on EOB and Mod A from PT for sit to Supine. PT educated Wife in proper gaurding technique and how to effectively and safely preform bed mobility while maintaining back precautions Mod cues for proper positioning for wife  in front of patient and decreased use of bed features by patient to simulate transfer at home.   Sit<>stand x 3 at EOB from elevated height with minA progressing to supervision A from PT to don Lower body clothing. PT provided min cues for improved use of BUE to push from seat surface to increase safety and success of transfer without locking LE on EOB.   WC mobility for 150f29fth supervision A. And mod cues for increased use of LUE to prevent veer to L as well as improved shoulder ROM to allow greater use of momentum through hall.    Blocked practice stand pivot Transfer training with  RW and Min A from PT x 6 and min a from wife x4. PT provided min cues for UE positioning to push from sitting surface and for proper positioning and hand placement for wife.  Block practice sit<>stand with RW from bed at 28 inches. PT provided min- progressing to Supervision A with min cues for UE placement to control descent.   Gait training for 70ft34f 100ft 83f min A from PT cues for increased step length and step width for normalized gait pattern and increased stabillity.   PT instructed patient in ramp training 2x 10 ft for ascent and descent with min A from PT and second bout with  min A from wife.  PT provided mod verbal cues to wife for safe positioning with gaurding husband as as well proper cues for safety on ramp including AD management and step length.     Therapy Documentation Precautions:  Precautions Precautions: Back Precaution Comments: unable to recall 0/3 back precautions.  Required Braces or Orthoses: Spinal Brace Spinal Brace: Applied in sitting position Restrictions Weight Bearing Restrictions: Yes RLE Weight Bearing: Weight bearing as tolerated LLE Weight Bearing: Weight bearing as tolerated Other Position/Activity Restrictions: CAM boot for RLE due to ankle fx, shoe for left foot Vital Signs: Therapy Vitals Pulse Rate: 62 BP: 130/67 Pain:   0/10   See Function Navigator for  Current Functional Status.   Therapy/Group: Individual Therapy  [name] 11/19/2015, 12:25 PM

## 2015-11-20 ENCOUNTER — Inpatient Hospital Stay (HOSPITAL_COMMUNITY): Payer: Medicare Other | Admitting: Physical Therapy

## 2015-11-20 ENCOUNTER — Inpatient Hospital Stay (HOSPITAL_COMMUNITY): Payer: Medicare Other

## 2015-11-20 LAB — BASIC METABOLIC PANEL
Anion gap: 7 (ref 5–15)
BUN: 11 mg/dL (ref 6–20)
CO2: 24 mmol/L (ref 22–32)
Calcium: 8.3 mg/dL — ABNORMAL LOW (ref 8.9–10.3)
Chloride: 105 mmol/L (ref 101–111)
Creatinine, Ser: 0.59 mg/dL — ABNORMAL LOW (ref 0.61–1.24)
GFR calc Af Amer: >60 mL/min (ref >60)
GFR calc non Af Amer: >60 mL/min (ref >60)
Glucose, Bld: 116 mg/dL — ABNORMAL HIGH (ref 65–99)
Potassium: 3.7 mmol/L (ref 3.5–5.1)
Sodium: 136 mmol/L (ref 135–145)

## 2015-11-20 LAB — CBC WITH DIFFERENTIAL/PLATELET
Basophils Absolute: 0 10*3/uL (ref 0.0–0.1)
Basophils Relative: 0 %
Eosinophils Absolute: 0.2 10*3/uL (ref 0.0–0.7)
Eosinophils Relative: 2 %
HCT: 33.7 % — ABNORMAL LOW (ref 39.0–52.0)
Hemoglobin: 10.5 g/dL — ABNORMAL LOW (ref 13.0–17.0)
Lymphocytes Relative: 25 %
Lymphs Abs: 1.8 10*3/uL (ref 0.7–4.0)
MCH: 30.3 pg (ref 26.0–34.0)
MCHC: 31.2 g/dL (ref 30.0–36.0)
MCV: 97.4 fL (ref 78.0–100.0)
Monocytes Absolute: 0.8 10*3/uL (ref 0.1–1.0)
Monocytes Relative: 11 %
Neutro Abs: 4.5 10*3/uL (ref 1.7–7.7)
Neutrophils Relative %: 62 %
Platelets: 345 10*3/uL (ref 150–400)
RBC: 3.46 MIL/uL — ABNORMAL LOW (ref 4.22–5.81)
RDW: 14.7 % (ref 11.5–15.5)
WBC: 7.3 10*3/uL (ref 4.0–10.5)

## 2015-11-20 MED ORDER — CEPHALEXIN 250 MG PO CAPS: 500.0000 mg | ORAL_CAPSULE | Freq: Three times a day (TID) | ORAL | Status: DC

## 2015-11-20 MED ORDER — CIPROFLOXACIN HCL 500 MG PO TABS
500.0000 mg | ORAL_TABLET | Freq: Two times a day (BID) | ORAL | Status: DC
  Administered 2015-11-20 – 2015-11-22 (×5): 500 mg via ORAL
  Filled 2015-11-20 (×5): qty 1

## 2015-11-20 NOTE — Progress Notes (Signed)
Checked on patient in gym--he had clear drainage on clothing. Post op dressing removed-area under wet and macerated appearing.  Proximal incision with 1 cm of yellow fibro-necrotic eschar with pin point area that has a trickle of clear fluid. Wound dressed and rechecked 45 minutes later. When supine incision is dry and no fluid expressed. When seated at edge of bed--positive drainage and with attempts at culture able to probe wound approx 3 cm depth with copious serous to blood tinged serous drainage expressed.  Culture sent and CBC/BMET ordered. NS contacted to evaluate and for input. Will resume cipro.

## 2015-11-20 NOTE — Progress Notes (Signed)
New inferior wound dehiscence with some serosanguenous drainage earlier today.  Not erythematous, no active drainage.  Cannot express any fluid.  Patient is afebrile.  Would restart antibiotics and continue current wound care.  Should not affect discharge plans.

## 2015-11-20 NOTE — Progress Notes (Signed)
Occupational Therapy Session Note  Patient Details  Name: Mitchell [name] MRN: [id number] Date of Birth: [birth year]  Today's Date: 11/20/2015 OT Individual Time: 1100-1200 OT Individual Time Calculation (min): 60 min     Short Term Goals: Week 3:  OT Short Term Goal 1 (Week 3): STGs = LTGs  Skilled Therapeutic Interventions/Progress Updates:    Pt resting in bed upon arrival with wife present.  Pt's wife assisted pt with bed mobility, donning LSO, and stand pivot transfer to w/c.  Pt propelled w/c to therapy gym and initially engaged in dynamic standing task on compliant surface (foam mat) requiring min A and rest breaks X 2 (pt's legs begin to buckle).  Pt transitioned to folding towel using BUE while standing on non-compliant surface.  Pt required rest break X 1 while folding five towels.  Pt required min A for standing.  Pt continues to require max verbal cues for sequencing when sitting back into w/c.  A reminder sign was placed on his walker to assist with sitting task.  Pt propelled w/c back to room and remained in w/c with wife present.   Therapy Documentation Precautions:  Precautions Precautions: Back Precaution Comments: unable to recall 0/3 back precautions.  Required Braces or Orthoses: Spinal Brace Spinal Brace: Applied in sitting position Restrictions Weight Bearing Restrictions: Yes RLE Weight Bearing: Weight bearing as tolerated LLE Weight Bearing: Weight bearing as tolerated Other Position/Activity Restrictions: CAM boot for RLE due to ankle fx, shoe for left foot Pain:  Pt denied pain this morning ADL: ADL ADL Comments: Refer to functional navigator Exercises:   Other Treatments:    See Function Navigator for Current Functional Status.   Therapy/Group: Individual Therapy  [name] 11/20/2015, 12:06 PM

## 2015-11-20 NOTE — Progress Notes (Signed)
Social Work Patient ID: Mitchell [name], male   DOB: Feb 01, 1935, 81 y.o.   MRN: [id number]  Met with pt and wife to discuss follow up therapies, both would like to go back to [facility] since he likes [facility] worked with before. Have made referral and they will contact wife to schedule an appointment. Aware since not getting home health will not have OT services. Pt wants to get out of the house and out to therapies. Wife finishing her family training and Feels ready for discharge Sat.

## 2015-11-20 NOTE — Progress Notes (Signed)
[place] PHYSICAL MEDICINE & REHABILITATION     PROGRESS NOTE  Subjective/Complaints:  Pt slept well overnight.  He appears more awake and alert, but still confused.    ROS: Denies CP, SOB, N/V/D.  Objective: Vital Signs: Blood pressure 103/68, pulse 70, temperature 98.4 F (36.9 C), temperature source Axillary, resp. rate 16, height 6\' 3"  (1.905 m), weight 127 kg (280 lb), SpO2 95 %. No results found. No results for input(s): WBC, HGB, HCT, PLT in the last 72 hours.  Recent Labs  11/19/15 0637  CREATININE 0.58*   CBG (last 3)  No results for input(s): GLUCAP in the last 72 hours.  Wt Readings from Last 3 Encounters:  11/19/15 127 kg (280 lb)  10/24/15 122.5 kg (270 lb)  10/16/15 122.5 kg (270 lb)    Physical Exam:  BP 103/68   Pulse 70   Temp 98.4 F (36.9 C) (Axillary)   Resp 16   Ht 6\' 3"  (1.905 m)   Wt 127 kg (280 lb)   SpO2 95%   BMI 35.00 kg/m  Constitutional: He appears well-developed and well-nourished.  HENT: Normocephalic and atraumatic.  Eyes: Conjunctivae are normal. Cardiovascular: Normal rate and regular rhythm.  Respiratory: Effort normal and breath sounds normal. No stridor. No respiratory distress. He has no wheezes.  GI: Soft. Bowel sounds are normal. He exhibits no distension. There is no tenderness.  Musculoskeletal: He exhibits min edema. He exhibits no tenderness.  Neurological: He is alert and oriented x2.  Follows simple commands HOH Motor: B/l UE: 4+/5 grossly proximal to distal RLE: hip flexion, knee extension 5/5, ankle dorsi/plantarflexion 4-/5 LLE: hip flexion, knee extension 5/5, ankle dorsi/plantar flexion 5/5  Skin: Skin is warm and dry. No erythema. Back incision with surgical dressing on c/d/i, some drainage.  Psychiatric: He has a normal mood and affect. Thought content normal.    Assessment/Plan: 1. Functional deficits secondary to lumbar radiculopathy L2/3 and kyphosis with L4/5 spondylolisthesis with hx of right foot  drop which require 3+ hours per day of interdisciplinary therapy in a comprehensive inpatient rehab setting. Physiatrist is providing close team supervision and 24 hour management of active medical problems listed below. Physiatrist and rehab team continue to assess barriers to discharge/monitor patient progress toward functional and medical goals.  Function:  Bathing Bathing position   Position: Wheelchair/chair at sink  Bathing parts Body parts bathed by patient: Right arm, Left arm, Chest, Abdomen, Front perineal area, Buttocks, Right upper leg, Left upper leg, Right lower leg, Left lower leg Body parts bathed by helper: Back  Bathing assist Assist Level: Touching or steadying assistance(Pt > 75%)      Upper Body Dressing/Undressing Upper body dressing   What is the patient wearing?: Pull over shirt/dress, Orthosis     Pull over shirt/dress - Perfomed by patient: Thread/unthread right sleeve, Thread/unthread left sleeve, Put head through opening, Pull shirt over trunk Pull over shirt/dress - Perfomed by helper: Pull shirt over trunk Button up shirt - Perfomed by patient: Thread/unthread right sleeve, Thread/unthread left sleeve, Button/unbutton shirt Button up shirt - Perfomed by helper: Pull shirt around back Orthosis activity level: Performed by helper  Upper body assist Assist Level: Set up   Set up : To obtain clothing/put away  Lower Body Dressing/Undressing Lower body dressing   What is the patient wearing?: Pants, Underwear, Ted Hose, AFO, Shoes Underwear - Performed by patient: Thread/unthread right underwear leg, Thread/unthread left underwear leg, Pull underwear up/down Underwear - Performed by helper: Pull underwear up/down  Pants- Performed by patient: Thread/unthread right pants leg, Thread/unthread left pants leg, Pull pants up/down Pants- Performed by helper: Pull pants up/down, Thread/unthread right pants leg, Thread/unthread left pants leg           Shoes -  Performed by helper: Don/doff right shoe, Don/doff left shoe AFO - Performed by patient: Don/doff right AFO     TED Hose - Performed by helper: Don/doff right TED hose, Don/doff left TED hose  Lower body assist Assist for lower body dressing: Touching or steadying assistance (Pt > 75%)      Toileting Toileting Toileting activity did not occur: No continent bowel/bladder event Toileting steps completed by patient: Adjust clothing prior to toileting, Performs perineal hygiene Toileting steps completed by helper: Adjust clothing after toileting Toileting Assistive Devices: Grab bar or rail  Toileting assist Assist level: Touching or steadying assistance (Pt.75%)   Transfers Chair/bed transfer   Chair/bed transfer method: Stand pivot Chair/bed transfer assist level: Touching or steadying assistance (Pt > 75%) Chair/bed transfer assistive device: Medical sales representative     Max distance: 100 ft Assist level: Touching or steadying assistance (Pt > 75%)   Wheelchair   Type: Manual Max wheelchair distance: 153ft Assist Level: Supervision or verbal cues  Cognition Comprehension Comprehension assist level: Understands basic 75 - 89% of the time/ requires cueing 10 - 24% of the time  Expression Expression assist level: Expresses basic 75 - 89% of the time/requires cueing 10 - 24% of the time. Needs helper to occlude trach/needs to repeat words.  Social Interaction Social Interaction assist level: Interacts appropriately 90% of the time - Needs monitoring or encouragement for participation or interaction.  Problem Solving Problem solving assist level: Solves basic 50 - 74% of the time/requires cueing 25 - 49% of the time  Memory Memory assist level: Recognizes or recalls 50 - 74% of the time/requires cueing 25 - 49% of the time     Medical Problem List and Plan: 1. Gait and cognitive deficits with weakness secondary to lumbar radiculopathy L2/3 and kyphosis with L4/5  spondylolisthesis with hx of right foot drop  Cont CIR therapies 2. DVT Prophylaxis/Anticoagulation: Pharmaceutical: Lovenox added as 5 days post op.  3. Pain Management:  Ultram qid for now, d/ced on 7/19 due to AMS   Increased tylenol on 7/24, changed to 500mg  4/day PRN 4. Mood: LCSW to follow for evaluation and support.  5. Neuropsych: This patient is not capable of making decisions on his own behalf.  Klonipin d/ced  Seroquel 12.5 qhs d/ced on 7/26  Trazodone 25mg  PRN, pt medication sensitive  CT head from 7/20 reviewed, no acute changes, small vessel disease  Ammonia normal 6. Skin/Wound Care: Monitor wound for healing. Maintain adequate nutrition and hydration status.   Surgical wound infection. Appreciate Neurosurg and ID recs. (improving)   Cont surgical dressing 7. Fluids/Electrolytes/Nutrition: Monitor I/O.   Hyponatremia: Resolved 136 on 7/28 8. HTN: Monitor BP bid. On amlodipine and atenolol.  9. Ureteral stricture: Currently voiding without difficulty Monitor I/O.  No I/O Cath, per Urology due to potential surgery and risk for further injury.   -condom cath HS 10. Ankle fracture: Xray reviewed on 7/25 with no healing of fracture.  Per Ortho, d/c CAM boot, WBAT. 11. Depression/Anxiety: Cymbalta d/ced due to ?side effects, although many other medications and infection also concomitant  12. GERD: On protonix.  13. Asthma: Encourage IS. On dulera for hyperactive airway.  14. Constipation: Increased miralax to bid. 15. CAD:  Cont meds 16. Obesity: Body mass index is 33.75 kg/(m^2) on admission, diet and exercise education, encourage weight loss to increase endurance and promote overall health 17. Right ankle fracture: Cont boot 18. Chronic back pain: With severe central canal stenosis and foraminal narrowing: Cont meds 19. Right RTC tear and DJD right shoulder: Cont meds 20. Cognitive deficits with difficulty processing: Likely secondary to narcotics +/-  infection. 21. Leukocytosis: Resolved  Wound cx with Pseudomonas and Klebsiella, Levaquin stopped 7/27  WBCs 8.2 on 7/28  Abx d/ced per ID  UA neg, Ucx multiple species 22. ABLA  Hb 10.2 on 7/28  Cont to monitor 13. Confusion: Improving.   ?Baseline dementia   Likely secondary to infection +/- medications  Will cont to monitor, medications also reviewed, pain and sleep medications adjusted. 14. Nocturnal urinary incontinence  Cont condom cath to avoid nighttime awakenings and improve sleep  LOS (Days) 22 A FACE TO FACE EVALUATION WAS PERFORMED  Ankit [name] 11/20/2015 10:03 AM

## 2015-11-20 NOTE — Progress Notes (Signed)
Physical Therapy Session Note  Patient Details  Name: Mitchell [name] MRN: [id number] Date of Birth: Aug 05, 1934  Today's Date: 11/20/2015 PT Individual Time: 1301-1430 AND [phone] PT Individual Time Calculation (min): 89 min AND 60 min     Short Term Goals: Week 2:  PT Short Term Goal 1 (Week 2): Patient will ambulate 7f with min A and RW.  PT Short Term Goal 1 - Progress (Week 2): Met PT Short Term Goal 2 (Week 2): Patient will perform bed mobility with min A and use of bed features.  PT Short Term Goal 2 - Progress (Week 2): Met PT Short Term Goal 3 (Week 2): Patient will perform WC mobility consistently with supervision Afor >1564f PT Short Term Goal 3 - Progress (Week 2): Met PT Short Term Goal 4 (Week 2): Patient will initiate stair training.  PT Short Term Goal 4 - Progress (Week 2): Met PT Short Term Goal 5 (Week 2): Patient will initiate stair training.  Week 3:  PT Short Term Goal 1 (Week 3): STG = LTG due to estimated d/c date.     Skilled Therapeutic Interventions/Progress Updates:     [facility]  PT assisted patient to don ted hose and R AFO and B shoes with max A.   PT transported patient to rehab gym with total A for time management.   Gait training with RW for 3062f 4 and 53f60f2. PT adjusted walker to fit patient and Compared rubber RW feet to plastic for safety and comfort. Min A provided through gait training with cues for increased step width and step height. PT noted increased unsteadiness with rubber feet on RW during turns due to increase friction.  PT instructed patient in stair training x 12 steps 4 6 inch and 8 3 inch. Mod A on 4 6 inch steps with cues or UE positioning  As well as proper step to gait pattern. Min A from Pt required for navigation of 3 inch step.    Following stair training PT noted excessive drainage for wound dressing on lowere back. PA notified and changed dressing. Significant serosanginous  drainage noted as PA dressed wound. Patient performed sit<>stand x 3 with min A while PA present and scooting to edge of chair with supervision A to allow better access to wound.   Nustep training x 10 minutes level 3-level6 with one rest break due to shoulder pain. PT provided mod cues for increased SPM >35 as well as proper foot positioning to prevent slipping.   Patient returned to room and [facility]   Session 2  Patient received sitting in [name] for PT.  Orthotist present for fitting of AFO with lateral upright and medial T strap for improved ankle stability. Gait training with AFO and RW for 90ft24f and min A from PT. Min cues for increased step width and proper step length using heel toe advancement technique. PT noted decreased pronation and decreased L knee genu recurvatum following fitting for AFO.   PT also instructed patient in Gait  Training on carpeted surface for 25ft 74fwith 2 bouts min A from PT and 2 bouts with min A from wife. Cues to wife for improved positioning with sit<>stand as well as gaurding in turns and min cues to patient to improve foot clearance with increased knee flexion.   Pt educated by PT in [place]  Including mini squats, hip abduction, standing HS curl, sit<>stand and LAQ. PT provided mod cues for improved technique to increase strengthening aspects of movement and for proper UE support on counter.   PT instructed patient and wife with bed mobility including supine<>sit x3 through log roll. PT instructed wife in proper technique for mod A with lifting BLE. Min cues also provided to encourage improved knee alignment to improved back positioning.   Patient left supine in bed with call bell in reach      Therapy Documentation Precautions:  Precautions Precautions: Back Precaution Comments: unable to recall 0/3 back precautions.  Required Braces or Orthoses: Spinal Brace Spinal Brace: Applied in sitting  position Restrictions Weight Bearing Restrictions: Yes RLE Weight Bearing: Weight bearing as tolerated LLE Weight Bearing: Weight bearing as tolerated Other Position/Activity Restrictions: CAM boot for RLE due to ankle fx, shoe for left foot    Pain: 0/10    See Function Navigator for Current Functional Status.   Therapy/Group: Individual Therapy  Mitchell [name] 11/20/2015, 5:32 PM

## 2015-11-21 ENCOUNTER — Inpatient Hospital Stay (HOSPITAL_COMMUNITY): Payer: Medicare Other | Admitting: Physical Therapy

## 2015-11-21 ENCOUNTER — Inpatient Hospital Stay (HOSPITAL_COMMUNITY): Payer: Medicare Other | Admitting: Occupational Therapy

## 2015-11-21 DIAGNOSIS — R030 Elevated blood-pressure reading, without diagnosis of hypertension: Secondary | ICD-10-CM

## 2015-11-21 DIAGNOSIS — R0989 Other specified symptoms and signs involving the circulatory and respiratory systems: Secondary | ICD-10-CM

## 2015-11-21 DIAGNOSIS — Z8739 Personal history of other diseases of the musculoskeletal system and connective tissue: Secondary | ICD-10-CM

## 2015-11-21 LAB — URINALYSIS, ROUTINE W REFLEX MICROSCOPIC
Bilirubin Urine: NEGATIVE
Glucose, UA: NEGATIVE mg/dL
Hgb urine dipstick: NEGATIVE
Ketones, ur: NEGATIVE mg/dL
Leukocytes, UA: NEGATIVE
Nitrite: NEGATIVE
Protein, ur: NEGATIVE mg/dL
Specific Gravity, Urine: 1.021 (ref 1.005–1.030)
pH: 5.5 (ref 5.0–8.0)

## 2015-11-21 MED ORDER — CLONAZEPAM 0.5 MG PO TABS: 0.2500 mg | ORAL_TABLET | Freq: Two times a day (BID) | ORAL | 0 refills | Status: DC | PRN

## 2015-11-21 MED ORDER — DOCUSATE SODIUM 100 MG PO CAPS: 100.0000 mg | ORAL_CAPSULE | Freq: Two times a day (BID) | ORAL | 0 refills | Status: AC

## 2015-11-21 MED ORDER — POLYETHYLENE GLYCOL 3350 17 G PO PACK
17.0000 g | PACK | Freq: Every day | ORAL | Status: DC | PRN
  Administered 2015-11-21: 17 g via ORAL
  Filled 2015-11-21: qty 1

## 2015-11-21 MED ORDER — CIPROFLOXACIN HCL 500 MG PO TABS: 500.0000 mg | ORAL_TABLET | Freq: Two times a day (BID) | ORAL | 1 refills | Status: DC

## 2015-11-21 MED ORDER — ATENOLOL 25 MG PO TABS: 12.5000 mg | ORAL_TABLET | Freq: Two times a day (BID) | ORAL | 5 refills | Status: DC

## 2015-11-21 MED ORDER — PANTOPRAZOLE SODIUM 40 MG PO TBEC: 40.0000 mg | DELAYED_RELEASE_TABLET | Freq: Every day | ORAL | 3 refills | Status: DC | PRN

## 2015-11-21 NOTE — Plan of Care (Signed)
Problem: RH Dressing Goal: LTG Patient will perform lower body dressing w/assist (OT) LTG: Patient will perform lower body dressing with assist, with/without cues in positioning using equipment (OT)  Outcome: Not Met (add Reason) Pt requires increased assist for donning/ tying B shoes and donning R AFO  Problem: RH Tub/Shower Transfers Goal: LTG Patient will perform tub/shower transfers w/assist (OT) LTG: Patient will perform tub/shower transfers with assist, with/without cues using equipment (OT)  Outcome: Not Met (add Reason) Goal not addressed as pt not cleared medically to shower due to surgical drainage. Also, pt unable to access home bathroom with heavy duty RW.

## 2015-11-21 NOTE — Progress Notes (Signed)
Occupational Therapy Session Note  Patient Details  Name: Mitchell [name] MRN: [id number] Date of Birth: 08-31-1934  Today's Date: 11/21/2015 OT Individual Time: [id number] OT Individual Time Calculation (min): 60 min     Short Term Goals:Week 3:  OT Short Term Goal 1 (Week 3): STGs = LTGs  Skilled Therapeutic Interventions/Progress Updates:    Pt seen for OT ADL bathing/dressing session. Pt in supine upon arrival with wife present. He declined showering task as he will not be showering at home and instead will be bathing from EOB. He transferred to EOB with min A and mod VCs for log rolling technique on flat bed to simulate home environment.  Bathing/dressing completed EOB with reacher and LH sponge used to assist with LB bathing/dressing in order to maintain spinal precautions. Pt able to recall 2/3 spinal precautions. He stood with min A from slightly elevated bed and pulled pants up with steadying assist. Increased assist required to don B shoes and R AFO. Pt reports that wife assisted with this PTA. He completed grooming tasks standing at sink, requiring UE support on sink for balance.  He ambulated throughout room using RW with min A to bathroom with VCs for RW management. Steadying assist and increased time required for pt to complete clothing management and extended seated rest break provided on toilet. Pt able to stand from elevated toilet (BSC placed over toilet) with supervision to RW.  He ambulated back to w/c, left with all needs in reach, and wife present.  Throughout session, educated regarding importance of participation with ADLs, benefits of mobility, spinal precautions, continuum of care, and d/c planing.   Therapy Documentation Precautions:  Precautions Precautions: Back Precaution Comments: unable to recall 0/3 back precautions.  Required Braces or Orthoses: Spinal Brace Spinal Brace: Applied in sitting position Restrictions Weight Bearing Restrictions: Yes RLE Weight  Bearing: Weight bearing as tolerated LLE Weight Bearing: Weight bearing as tolerated Other Position/Activity Restrictions: CAM boot for RLE due to ankle fx, shoe for left foot Pain: Pain Assessment Pain Assessment: 0-10 Pain Score: 2  Pain Type: Surgical pain Pain Location: Back Pain Orientation: Lower;Mid Pain Descriptors / Indicators: Aching Patients Stated Pain Goal: 1 Pain Intervention(s): Ambulation/increased activity ADL: ADL ADL Comments: Refer to functional navigator  See Function Navigator for Current Functional Status.   Therapy/Group: Individual Therapy  [name], Demari Gales C 11/21/2015, 7:11 AM

## 2015-11-21 NOTE — Discharge Instructions (Signed)
Inpatient Rehab Discharge Instructions  CALI GOLSON Discharge date and time:    Activities/Precautions/ Functional Status: Activity: no lifting, driving, or strenuous exercise till cleared by MD.  Diet: regular diet Wound Care: keep wound clean and dry   Functional status:  ___ No restrictions     ___ Walk up steps independently ___ 24/7 supervision/assistance   ___ Walk up steps with assistance ___ Intermittent supervision/assistance  ___ Bathe/dress independently ___ Walk with walker     ___ Bathe/dress with assistance ___ Walk Independently    ___ Shower independently ___ Walk with assistance    ___ Shower with assistance _X__ No alcohol     ___ Return to work/school ________  Special Instructions:    COMMUNITY REFERRALS UPON DISCHARGE:    HOME HEALTH: [facility]   PHONE: [phone]     Medical Equipment/Items [place]   [phone]    My questions have been answered and I understand these instructions. I will adhere to these goals and the provided educational materials after my discharge from the hospital.  Patient/Caregiver Signature _______________________________ Date __________  Clinician Signature _______________________________________ Date __________  Please bring this form and your medication list with you to all your follow-up doctor's appointments.

## 2015-11-21 NOTE — Progress Notes (Signed)
Social Work Patient ID: Mitchell [name], male   DOB: November 22, 1934, 81 y.o.   MRN: [id number]  Met with pt and wife after talking with Pam-PA who reports he will need a HHRN for his wound and monitoring Spoke with wife and pt regarding preference of agencies and they have chosen [facility]. Will make referral and let [facility] PT know of the change. Has equipment in room. Work toward discharge Plan for tomorrow.

## 2015-11-21 NOTE — Progress Notes (Signed)
Occupational Therapy Discharge Summary  Patient Details  Name: Mitchell [name] MRN: [id number] Date of Birth: 1934/10/15   Patient has met 6 of 8 long term goals due to improved activity tolerance, improved balance, postural control and improved coordination.  Patient to discharge at [facility] Supervision- [place] level.  Patient's care partner is independent to provide the necessary physical and cognitive assistance at discharge.   Pt currently completing bathing/dressing seated on EOB as pt unsure if home bathroom is accessible with heavy duty RW. Pt's wife has been present throughout rehab stay and has demonstrated ability and willingness to provide needed assistance at home.  Pt continues to require education and VCs for adherence to spinal precautions during functional tasks.  Reasons goals not met: Pt did not meet shower goal as pt medically not allowed to shower while on rehab due to surgical site drainage. Also, pt does not believe heavy duty RW will fit into home bathroom there for goal not addressed at this time.   Recommendation:  Patient will benefit from Home health therapy in order to cont to increase independence with ADLs and reduce caregiver burden   Equipment: No equipment provided. Pt has all needed equipment.   Reasons for discharge: treatment goals met and discharge from hospital  Patient/family agrees with progress made and goals achieved: Yes  OT Discharge Precautions/Restrictions  Precautions Precautions: Back Precaution Comments: Recalled 2/3 spinal precautions independently. Requires VCs for adherence during functional tasks.  Required Braces or Orthoses: Spinal Brace;Other Brace/Splint Spinal Brace: Applied in sitting position;Lumbar corset Other Brace/Splint: R AFO Restrictions Weight Bearing Restrictions: Yes RLE Weight Bearing: Weight bearing as tolerated LLE Weight Bearing: Weight bearing as tolerated ADL ADL ADL Comments: Refer to functional  navigator Vision/Perception  Vision- History Baseline Vision/History: Wears glasses Wears Glasses: At all times Patient Visual Report: No change from baseline Vision- Assessment Vision Assessment?: No apparent visual deficits  Cognition Orientation Level: Oriented X4 Memory: Impaired Memory Impairment: Retrieval deficit Awareness: Impaired Problem Solving: Impaired Safety/Judgment: Impaired Comments: decreased safety due to baseline memory deficits.  Sensation Sensation Light Touch: Appears Intact Hot/Cold: Appears Intact Proprioception: Appears Intact Coordination Gross Motor Movements are Fluid and Coordinated: No Fine Motor Movements are Fluid and Coordinated: Yes Coordination and Movement Description: decreased hip flexion strength limits LE coordination.   Motor  Motor Motor: Other (comment) Motor - Skilled Clinical Observations: Generalized weakness.  Motor - Discharge Observations: Generalized weakness Mobility     Trunk/Postural Assessment  Cervical Assessment Cervical Assessment: Within Functional Limits Thoracic Assessment Thoracic Assessment: Exceptions to [facility] (Spinal precautions; kyphotic) Lumbar Assessment Lumbar Assessment: Exceptions to [facility] (Spinal precautions; posterior pelvic tilt) Postural Control Postural Control: Deficits on evaluation (Flexed posture overall)  Balance Balance Balance Assessed: Yes Static Sitting Balance Static Sitting - Balance Support: Feet unsupported Static Sitting - Level of Assistance: 6: Modified independent (Device/Increase time) Static Sitting - Comment/# of Minutes: Sitting EOB Dynamic Sitting Balance Dynamic Sitting - Balance Support: Feet supported Dynamic Sitting - Level of Assistance: 6: Modified independent (Device/Increase time) Sitting balance - Comments: Sitting EOB to complete bathing/dressing Static Standing Balance Static Standing - Balance Support: Bilateral upper extremity supported Static Standing -  Level of Assistance: 5: Stand by assistance Static Standing - Comment/# of Minutes: Standing at RW; min A required without UE support Dynamic Standing Balance Dynamic Standing - Balance Support: During functional activity Dynamic Standing - Level of Assistance: 4: Min assist (with RW) Dynamic Standing - Comments: Standing to complete LB dressing/ toileting tasks Extremity/Trunk Assessment  RUE Assessment RUE Assessment: Exceptions to WFL (90 degree shoulder flexion due premorbid condition.  All other joints WFL) LUE Assessment LUE Assessment: Within Functional Limits   See Function Navigator for Current Functional Status.  Mitchell [name] 11/21/2015, 8:02 AM

## 2015-11-21 NOTE — Progress Notes (Signed)
Center PHYSICAL MEDICINE & REHABILITATION     PROGRESS NOTE  Subjective/Complaints:  Pt slept well overnight.  He is looking forward to going home soon.   ROS: Denies CP, SOB, N/V/D.  Objective: Vital Signs: Blood pressure (!) 112/50, pulse 60, temperature 98.4 F (36.9 C), temperature source Oral, resp. rate 16, height 6\' 3"  (1.905 m), weight 127 kg (280 lb), SpO2 96 %. No results found.  Recent Labs  11/20/15 1032  WBC 7.3  HGB 10.5*  HCT 33.7*  PLT 345    Recent Labs  11/19/15 0637 11/20/15 1032  NA  --  136  K  --  3.7  CL  --  105  GLUCOSE  --  116*  BUN  --  11  CREATININE 0.58* 0.59*  CALCIUM  --  8.3*   CBG (last 3)  No results for input(s): GLUCAP in the last 72 hours.  Wt Readings from Last 3 Encounters:  11/19/15 127 kg (280 lb)  10/24/15 122.5 kg (270 lb)  10/16/15 122.5 kg (270 lb)    Physical Exam:  BP (!) 112/50   Pulse 60   Temp 98.4 F (36.9 C) (Oral)   Resp 16   Ht 6\' 3"  (1.905 m)   Wt 127 kg (280 lb)   SpO2 96%   BMI 35.00 kg/m  Constitutional: He appears well-developed and well-nourished.  HENT: Normocephalic and atraumatic.  Eyes: Conjunctivae are normal. Cardiovascular: Normal rate and regular rhythm.  Respiratory: Effort normal and breath sounds normal. No stridor. No respiratory distress. He has no wheezes.  GI: Soft. Bowel sounds are normal. He exhibits no distension. There is no tenderness.  Musculoskeletal: He exhibits min edema. He exhibits no tenderness.  Neurological: He is alert and oriented x2.  Follows simple commands HOH Motor: B/l UE: 4+/5 grossly proximal to distal RLE: hip flexion, knee extension 5/5, ankle dorsi/plantarflexion 4-/5 (baseline) LLE: hip flexion, knee extension 5/5, ankle dorsi/plantar flexion 5/5  Skin: Skin is warm and dry. No erythema. Back incision with surgical dressing on c/d/i (recently dressed).  Psychiatric: He has a normal mood and affect. Thought content normal.     Assessment/Plan: 1. Functional deficits secondary to lumbar radiculopathy L2/3 and kyphosis with L4/5 spondylolisthesis with hx of right foot drop which require 3+ hours per day of interdisciplinary therapy in a comprehensive inpatient rehab setting. Physiatrist is providing close team supervision and 24 hour management of active medical problems listed below. Physiatrist and rehab team continue to assess barriers to discharge/monitor patient progress toward functional and medical goals.  Function:  Bathing Bathing position   Position: Wheelchair/chair at sink  Bathing parts Body parts bathed by patient: Right arm, Left arm, Chest, Abdomen, Front perineal area, Buttocks, Right upper leg, Left upper leg, Right lower leg, Left lower leg Body parts bathed by helper: Back  Bathing assist Assist Level: Touching or steadying assistance(Pt > 75%)      Upper Body Dressing/Undressing Upper body dressing   What is the patient wearing?: Pull over shirt/dress, Orthosis     Pull over shirt/dress - Perfomed by patient: Thread/unthread right sleeve, Thread/unthread left sleeve, Put head through opening, Pull shirt over trunk Pull over shirt/dress - Perfomed by helper: Pull shirt over trunk Button up shirt - Perfomed by patient: Thread/unthread right sleeve, Thread/unthread left sleeve, Button/unbutton shirt Button up shirt - Perfomed by helper: Pull shirt around back Orthosis activity level: Performed by helper  Upper body assist Assist Level: Set up   Set up : To  obtain clothing/put away  Lower Body Dressing/Undressing Lower body dressing   What is the patient wearing?: Pants, Underwear, Ted Hose, AFO, Shoes Underwear - Performed by patient: Thread/unthread right underwear leg, Thread/unthread left underwear leg, Pull underwear up/down Underwear - Performed by helper: Pull underwear up/down Pants- Performed by patient: Thread/unthread right pants leg, Thread/unthread left pants leg, Pull  pants up/down Pants- Performed by helper: Pull pants up/down, Thread/unthread right pants leg, Thread/unthread left pants leg           Shoes - Performed by helper: Don/doff right shoe, Don/doff left shoe AFO - Performed by patient: Don/doff right AFO     TED Hose - Performed by helper: Don/doff right TED hose, Don/doff left TED hose  Lower body assist Assist for lower body dressing: Touching or steadying assistance (Pt > 75%)      Toileting Toileting Toileting activity did not occur: No continent bowel/bladder event Toileting steps completed by patient: Adjust clothing prior to toileting, Performs perineal hygiene Toileting steps completed by helper: Adjust clothing after toileting Toileting Assistive Devices: Grab bar or rail  Toileting assist Assist level: Touching or steadying assistance (Pt.75%)   Transfers Chair/bed transfer   Chair/bed transfer method: Stand pivot Chair/bed transfer assist level: Touching or steadying assistance (Pt > 75%) Chair/bed transfer assistive device: Armrests, Medical sales representative     Max distance: 72ft Assist level: Touching or steadying assistance (Pt > 75%)   Wheelchair   Type: Manual Max wheelchair distance: 150 Assist Level: Supervision or verbal cues  Cognition Comprehension Comprehension assist level: Understands basic 75 - 89% of the time/ requires cueing 10 - 24% of the time  Expression Expression assist level: Expresses basic 75 - 89% of the time/requires cueing 10 - 24% of the time. Needs helper to occlude trach/needs to repeat words.  Social Interaction Social Interaction assist level: Interacts appropriately 90% of the time - Needs monitoring or encouragement for participation or interaction.  Problem Solving Problem solving assist level: Solves basic 50 - 74% of the time/requires cueing 25 - 49% of the time  Memory Memory assist level: Recognizes or recalls 50 - 74% of the time/requires cueing 25 - 49% of the time      Medical Problem List and Plan: 1. Gait and cognitive deficits with weakness secondary to lumbar radiculopathy L2/3 and kyphosis with L4/5 spondylolisthesis with hx of right foot drop  Cont CIR therapies  Plan to d/c tomorrow.  Will see pt in 1-2 weeks for transitional care management, post-discharge 2. DVT Prophylaxis/Anticoagulation: Pharmaceutical: Lovenox added as 5 days post op.  3. Pain Management:  Ultram qid for now, d/ced on 7/19 due to AMS   Increased tylenol on 7/24, changed to 500mg  4/day PRN 4. Mood: LCSW to follow for evaluation and support.  5. Neuropsych: This patient is not capable of making decisions on his own behalf.  Klonipin d/ced  Seroquel 12.5 qhs d/ced on 7/26  Trazodone 25mg  PRN, pt medication sensitive  CT head from 7/20 reviewed, no acute changes, small vessel disease  Ammonia normal 6. Skin/Wound Care: Monitor wound for healing. Maintain adequate nutrition and hydration status.   Surgical wound infection. Appreciate Neurosurg and ID recs. Increased drainage noted 8/3, Neurosurg evaluated, cultures pending, restarted abx. 7. Fluids/Electrolytes/Nutrition: Monitor I/O.   Hyponatremia: Resolved 136 on 7/28 8. HTN: Monitor BP bid. On atenolol.   Will d/c Norvasc 9. Ureteral stricture: Currently voiding without difficulty Monitor I/O.  No I/O Cath, per Urology due to potential  surgery and risk for further injury.   -condom cath HS 10. Ankle fracture: Xray reviewed on 7/25 with no healing of fracture.  Per Ortho, d/c CAM boot, WBAT. 11. Depression/Anxiety: Cymbalta d/ced due to ?side effects, although many other medications and infection also concomitant  12. GERD: On protonix.  13. Asthma: Encourage IS. On dulera for hyperactive airway.  14. Constipation: Increased miralax to bid. 15. CAD: Cont meds 16. Obesity: Body mass index is 33.75 kg/(m^2) on admission, diet and exercise education, encourage weight loss to increase endurance  and promote overall health 17. Right ankle fracture: Cont boot 18. Chronic back pain: With severe central canal stenosis and foraminal narrowing: Cont meds 19. Right RTC tear and DJD right shoulder: Cont meds 20. Cognitive deficits with difficulty processing: Likely secondary to narcotics +/- infection. 21. Leukocytosis: Resolved  Wound cx with Pseudomonas and Klebsiella, Levaquin stopped 7/27  WBCs 8.2 on 7/28  Abx d/ced per ID  UA neg, Ucx multiple species 22. ABLA  Hb 10.2 on 7/28  Cont to monitor 13. Confusion: Improving.   ?Baseline dementia   Likely secondary to infection +/- medications  Will cont to monitor, medications also reviewed, pain and sleep medications adjusted. 14. Nocturnal urinary incontinence  Cont condom cath to avoid nighttime awakenings and improve sleep  LOS (Days) 23 A FACE TO FACE EVALUATION WAS PERFORMED  Ardie Dragoo [name] 11/21/2015 8:48 AM

## 2015-11-21 NOTE — Progress Notes (Signed)
Occupational Therapy Session Note  Patient Details  Name: Mitchell [name] MRN: [id number] Date of Birth: July 02, 1934  Today's Date: 11/21/2015 OT Individual Time: 1400-1430 OT Individual Time Calculation (min): 30 min     Short Term Goals: Week 2:  OT Short Term Goal 1 (Week 2): Pt will sit to stand with min assist with RW to prepare for LB dressing  OT Short Term Goal 1 - Progress (Week 2): Met OT Short Term Goal 2 (Week 2): Pt will don pants over feet with reacher with min assist  OT Short Term Goal 2 - Progress (Week 2): Met OT Short Term Goal 3 (Week 2): Pt will bathe LB with LH sponge with mod assist  OT Short Term Goal 3 - Progress (Week 2): Met OT Short Term Goal 4 (Week 2): Pt will transfer to [name] using scoot transfer with min assist  OT Short Term Goal 4 - Progress (Week 2): Met Week 3:  OT Short Term Goal 1 (Week 3): STGs = LTGs  Skilled Therapeutic Interventions/Progress Updates:    1:1 continued family education with pt and wife on bed mobility 2x/ Perform bed mobility in his regular hospital bed without rails and flat and in the gym with bed height at 26 inches. Pt required min to mod instructional cues to maintain precautions but wife is able to provide this without cues from therapist. Continued to address activity tolerance and LE strengthening with propelling w/c with UEs and LEs and ability to maintain straight LE raises while therapist pushing chair. Pt did complain of some discomfort in back of left knee but relief with seated rest breaks.  Therapy Documentation Precautions:  Precautions Precautions: Back Precaution Comments: Recalled 2/3 spinal precautions independently. Requires VCs for adherence during functional tasks.  Required Braces or Orthoses: Spinal Brace, Other Brace/Splint Spinal Brace: Applied in sitting position, Lumbar corset Other Brace/Splint: R AFO Restrictions Weight Bearing Restrictions: Yes RLE Weight Bearing: Weight bearing as tolerated LLE  Weight Bearing: Weight bearing as tolerated Other Position/Activity Restrictions: CAM boot for RLE due to ankle fx, shoe for left foot    Vital Signs: Therapy Vitals Temp: 97.5 F (36.4 C) Temp Source: Oral Pulse Rate: 61 Resp: 20 BP: 101/68 Oxygen Therapy SpO2: 98 % O2 Device: Not Delivered Pain: Pain Assessment Pain Assessment: 0-10 Pain Score: 2  Pain Type: Surgical pain Pain Location: Back Pain Orientation: Mid Pain Descriptors / Indicators: Aching Pain Intervention(s): Medication (See eMAR) ADL: ADL ADL Comments: Refer to functional navigator  See Function Navigator for Current Functional Status.   Therapy/Group: Individual Therapy  [name] 11/21/2015, 3:21 PM

## 2015-11-21 NOTE — Discharge Summary (Signed)
Physician Discharge Summary  Patient ID: MICKEY NELAN MRN: [id number] DOB/AGE: Nov 03, 1934 81 y.o.  Admit date: 10/29/2015 Discharge date: 11/22/2015  Discharge Diagnoses:  Principal Problem:   Intervertebral disc disorder with radiculopathy of lumbar region Active Problems:   Morbid obesity (HCC)   Ankle fracture, right   Hyponatremia   Leukocytosis   Acute blood loss anemia   Debility   Surgical wound dehiscence   Surgical wound infection   Agitation   Sleep disturbance   Labile blood pressure   History of right foot drop   Subacute confusional state   Discharged Condition:  Stable  Significant Diagnostic Studies: Dg Ankle Complete Right  Result Date: 11/11/2015 CLINICAL DATA:  Followup of right ankle fracture. EXAM: RIGHT ANKLE - COMPLETE 3+ VIEW COMPARISON:  09/17/2015 FINDINGS: diffuse soft tissue swelling is increased. No significant healing across the previously described distal fibular fracture. Base of fifth metatarsal and talar dome intact. Vascular calcifications. Tibiotalar osteoarthritis. Possible remote medial malleolar avulsion fracture. IMPRESSION: No significant healing of distal fibular fracture since 09/17/2015. Increase in diffuse soft tissue swelling. Electronically Signed   By: Abigail Miyamoto M.D.   On: 11/11/2015 19:51  Ct Head Wo Contrast  Result Date: 11/06/2015 CLINICAL DATA:  Lumbar surgery yesterday/ sudden onset memory loss EXAM: CT HEAD WITHOUT CONTRAST TECHNIQUE: Contiguous axial images were obtained from the base of the skull through the vertex without intravenous contrast. COMPARISON:  05/09/2013 FINDINGS: Periventricular white matter changes are consistent with small vessel disease. There is mild central cortical atrophy. There is no intra or extra-axial fluid collection or mass lesion. The basilar cisterns and ventricles have a normal appearance. There is no CT evidence for acute infarction or hemorrhage. Bone windows are unremarkable. IMPRESSION: 1.  The atrophy and small vessel disease. 2.  No evidence for acute intracranial abnormality. Electronically Signed   By: Nolon Nations M.D.   On: 11/06/2015 16:26   Labs:  Basic Metabolic Panel: BMP Latest Ref Rng & Units 11/20/2015 11/19/2015 11/14/2015  Glucose 65 - 99 mg/dL 116(H) - 104(H)  BUN 6 - 20 mg/dL 11 - 8  Creatinine 0.61 - 1.24 mg/dL 0.59(L) 0.58(L) 0.55(L)  Sodium 135 - 145 mmol/L 136 - 136  Potassium 3.5 - 5.1 mmol/L 3.7 - 4.2  Chloride 101 - 111 mmol/L 105 - 105  CO2 22 - 32 mmol/L 24 - 22  Calcium 8.9 - 10.3 mg/dL 8.3(L) - 8.4(L)    CBC: CBC Latest Ref Rng & Units 11/20/2015 11/14/2015 11/11/2015  WBC 4.0 - 10.5 K/uL 7.3 8.2 11.2(H)  Hemoglobin 13.0 - 17.0 g/dL 10.5(L) 10.2(L) 10.9(L)  Hematocrit 39.0 - 52.0 % 33.7(L) 33.0(L) 35.0(L)  Platelets 150 - 400 K/uL 345 386 510(H)    CBG: No results for input(s): GLUCAP in the last 168 hours.  Today's Vitals   11/22/15 0101 11/22/15 0201 11/22/15 0448 11/22/15 0649  BP:   127/67   Pulse:   87   Resp:   18   Temp:   99 F (37.2 C)   TempSrc:   Oral   SpO2:   91%   Weight:      Height:      PainSc: 3  Asleep  3    Brief HPI:   Jacarie (Red)  W [name] is a 81 y.o. male with history of CAD with LVD, A flutter, melanoma, ureteral stricture, obesity, Right foot drop X 10 years, right ankle fracture a month ago, L4/5 decompression, back pain with radiation to BLE and weakness  due to HNP L2/3 with severe central canal stenosis and worsening of left foraminal narrowing at L4/5 with kyphosis and spondylosis. He elected to undergo redo decompression with fusion L4/5 adn L5/S1 with L2/3 laminectomy by Dr. Vertell Limber on 10/24/15.  Post op has been limited due to recent fall with right ankle fracture, confusion with difficulty processing, decreased standing balance and limited ability to walk.  CIR was recommended for follow up therapy.   Hospital Course: Mitchell [name] was admitted to rehab 10/29/2015 for inpatient therapies to consist of  PT, ST and OT at least three hours five days a week. Past admission physiatrist, therapy team and rehab RN have worked together to provide customized collaborative inpatient rehab. He continued to have issues with confusion and sundowning with agitation.  Narcotics were discontinue and low dose ultram was added to help manage pain without improvement. Ultimately, ultram was discontinued and low dose Seroquel was added to help manage sleep wake disruption with tylenol alone for pain management. Family reported issues with confusion prior to surgery and his cognition has greatly improved with resolution of agitation at nights/ sundowning. Lytes have been monitored with serial checks and hyponatremia has resolved.   He did develop copious serosanguinous  drainage on 07/16 from back incision and was found to have rise in WBC to 15.2. Wound culture was positive for Klebsiella pneumo and abundant pseudomonas aeruginosa and he was started on IV antibiotics for treatment.   Dr. Vertell Limber was consulted for input and recommended frequent dressing changes as well as input from ID.  Dr. Linus Salmons recommended po Levaquin X 7 days as no other signs of infection noted and clearing of mental status.   Incisional wound VAC was placed for 5 days with minimal drainage therefore was discontinued on 08/01.  He has developed recurrence in serosanguinous drainage with dehiscence at proximal aspect as well as superfical dehiscence at inferior aspect since 08/03.  NS recommended local care with frequent dressing changes and resumption of antibiotics for wound coverage. Patient has been afebrile and repeat CBC shows WBC to be stable.  Wound culture shows normal flora and repeat UCS shows no growth.  HHRN was arranged for wound monitoring and dressing changes.   His blood pressures were monitored on bid basis and were trending down therefore Norvasc was discontinued on 08/4.  Urine culture of 7/13 showed multiple species. He continues to have  frequency with nocturia as well as incontinence and was found to have retention with PVRs ranging from 169-466 cc. Patient and family have refused in and out caths for bladder decompression  despite education about concerns of UTIs.   Dr. Erlinda Hong was consulted for follow up on right ankle fracture and recommended discontinuation of boot with WBAT as patient with clinically healed fracture. He was fitted with R-AFO with improvement in gait quality and stability.  He has made steady progress with improvement in cognition and resolution of agitation. He is currently at  [facility] assist level. His wife has been present for multiple therapy sessions and is able to provide care needed.  He will continue to receive follow up HHPT, [place] and HHS by [facility] at home after discharge.    Rehab course: During patient's stay in rehab weekly team conferences were held to monitor patient's progress, set goals and discuss barriers to discharge. At admission, patient required max assist with basic self care tasks and mobility. He has had improvement in activity tolerance, balance, postural control, as well as ability to compensate for deficits.  He is has had improvement in functional use BLE,  Improvement in cognition as well as improvemen in safety awareness. He is able to complete bathing and dressing at EOB with use of AE and supervision to min assist to maintain back precautions. He requires min assist for transfers and to ambulate 31' with RW.  Wife has been supportive and present for multiple therapy sessions. She has been educated on all aspects of mobility as well as wound care and will provide supervision after discharge.    Disposition: 01-Home or Self Care  Diet: Regular.   Special Instructions: 1. Cleanse incision with normal saline. Paint with betadine and cover with ABD pad. Change dressing at least four time a day or more frequently to prevent maceration. 2. Needs 24 hours supervision. Continue brace at edge of bed  and back precautions.  3. Contact MD if you develop increase in drainage or purulent drainage, redness, swelling, increase in painor if you develop fever or chills.  4.  DUE TO RISK OF INJURY, PLEASE REMOVE GUNS, KNIVES, OR ANY OTHER POTENTIALLY DANGEROUS OBJECTS AND HAZARDOUS MATERIALS FROM THE HOME.     Medication List    STOP taking these medications   amLODipine 5 MG tablet Commonly known as:  NORVASC   DULoxetine 30 MG capsule Commonly known as:  CYMBALTA   HYDROcodone-acetaminophen 5-325 MG tablet Commonly known as:  NORCO/VICODIN   meloxicam 15 MG tablet Commonly known as:  MOBIC   traMADol 50 MG tablet Commonly known as:  ULTRAM     TAKE these medications   acetaminophen 325 MG tablet Commonly known as:  TYLENOL Take 2 tablets (650 mg total) by mouth 3 (three) times daily as needed for mild pain or moderate pain. What changed:  medication strength  how much to take  when to take this  reasons to take this   atenolol 25 MG tablet Commonly known as:  TENORMIN Take 0.5 tablets (12.5 mg total) by mouth 2 (two) times daily. What changed:  See the new instructions.   budesonide-formoterol 160-4.5 MCG/ACT inhaler Commonly known as:  SYMBICORT Inhale 2 puffs into the lungs 2 (two) times daily. What changed:  when to take this  reasons to take this   calcium carbonate 600 MG Tabs tablet Commonly known as:  OS-CAL Take 600 mg by mouth 2 (two) times daily with a meal. Reported on 05/29/2015   carboxymethylcellulose 0.5 % Soln Commonly known as:  REFRESH PLUS Place 1 drop into both eyes 3 (three) times daily as needed (dry eyes).   ciprofloxacin 500 MG tablet Commonly known as:  CIPRO Take 1 tablet (500 mg total) by mouth 2 (two) times daily. antibiotics   docusate sodium 100 MG capsule Commonly known as:  COLACE Take 1 capsule (100 mg total) by mouth 2 (two) times daily.   furosemide 40 MG tablet Commonly known as:  LASIX Take 0.5 tablets (20 mg total)  by mouth every other day. Please make appointment for refills.   hydrocortisone cream 1 % Apply 1 application topically daily as needed for itching.   pantoprazole 40 MG tablet Commonly known as:  PROTONIX Take 1 tablet (40 mg total) by mouth daily as needed (indigestion). What changed:  See the new instructions.   polyethylene glycol packet Commonly known as:  MIRALAX / GLYCOLAX Take 17 g by mouth at bedtime.   ZETIA 10 MG tablet Generic drug:  ezetimibe TAKE 1 TABLET BY MOUTH ONCE DAILY.      Follow-up Information  Delice Lesch MD.   Why:  office will call you with follow up appointment Contact information: [address] [place], [place]       Peggyann Shoals, MD Follow up on 12/01/2015.   Specialty:  Neurosurgery Why:  Appointment @ 11:45 am Contact information: [address] [place] [address] [phone]        Elby Showers, MD Follow up on 12/02/2015.   Specialty:  Internal Medicine Why:  Appointment @ 12:00 Contact information: [phone] [phone]           Signed: Bary Leriche 11/24/2015, 1:14 PM

## 2015-11-21 NOTE — Progress Notes (Signed)
Social Work  Discharge Note  The overall goal for the admission was met for:   Discharge location: Yes-HOME WITH WIFE WHO CAN PROVIDE 24 HR SUPERVISION/MIN ASSIST LEVEL  Length of Stay: Yes-24 DAYS  Discharge activity level: Yes-SUPERVISION/MIN ASSIST LEVEL  Home/community participation: Yes  Services provided included: MD, RD, PT, OT, RN, CM, TR, Pharmacy and SW  Financial Services: Medicare and Private Insurance: [place]  Follow-up services arranged: Home Health: [facility]-PT & RN, DME: [place] and Patient/Family has no preference for HH/DME agencies  Comments (or additional information):WIFE HAS BEEN HERE DAILY AND PARTICIPATED IN [employer] [facility]. UPSET WOUND HAS GOTTEN WORSE DUE TO HAD WANTED TO DO OP THERAPY. AGREEABLE TO HOME HEALTH UNTIL WOUND MORE HEALED AND THEN TRANSITION TO OPPT.   Patient/Family verbalized understanding of follow-up arrangements: Yes  Individual responsible for coordination of the follow-up plan: ANN-WIFE  Confirmed correct DME delivered: Elease Hashimoto 11/21/2015    Elease Hashimoto

## 2015-11-21 NOTE — Progress Notes (Addendum)
Physical Therapy Discharge Summary  Patient Details  Name: Mitchell [name] MRN: [id number] Date of Birth: 08/11/1934  Today's Date: 11/21/2015 PT Individual Time:  4142-3953 AND [phone]  Calculated PT individual time: 59 min AND 44 min    Patient has met 10 of 12 long term goals due to improved activity tolerance, improved balance, improved postural control, increased strength, increased range of motion, improved attention and improved awareness.  Patient to discharge at an ambulatory level [place].   Patient's care partner is independent to provide the necessary physical and cognitive assistance at discharge.  Reasons goals not met: increased knee pain and continued hip strength deficits prevents patient from increased distance with ambulation and requires continued assistance with sit>supine transfer.   Recommendation:  Patient will benefit from ongoing skilled PT services in home health setting to continue to advance safe functional mobility, address ongoing impairments in Strength, balance, gait, endurance, safety, memory, and minimize fall risk.  Equipment: Bariatric RW LLE AFO   Reasons for discharge: treatment goals met and discharge from hospital  Patient/family agrees with progress made and goals achieved: Yes   Treatment:  Session 1:  Patient received sitting in WC and agreeable to PT.  Patient performed grad day assessment; see below for results.   Patient instructed in car transfer with min A to lift LLE. Patient able to negotiate R LE with use of BUE.   Gait over uneven surface for 10 ft with min A and well as min cues for AD management.   Bed mobility for sit<>supine x 2 with min A-mod A for sit>supine and supervision A for supine>sit through log roll. PT provided moderate multimodal cues for improved technique and increased activation of hip musculature to reduce need for assistance into sidelying.   Session 2:  Patient received sitting up in [facility] and agreeable to  PT.   Patient performed WC mobility for 114f without cues or assistance form PT  Gait training for 537fwith RW and Min A. Only min cues needed for improved step length, but patient noted to have decreased stance time on LLE due to new onset of medial knee pain.   Sit<>stand transfer training x 8 with supervision A and min cues from PT for improved anterior weight shift, decreased bracing of BLE on chair and to reach back to improve safet of stand>sit.   Standing balance toss horse shoes to target at 1574fn 1 UE support. 8 x3  With minA-steady A from PT for improved safety.  Patient returned to room and [facility] and all needs met.    PT Discharge Precautions/RestrictionsPrecautions Precautions: Back Precaution Comments: Recalled 2/3 spinal precautions independently. Requires VCs for adherence during functional tasks.  Required Braces or Orthoses: Spinal Brace;Other Brace/Splint Spinal Brace: Applied in sitting position;Lumbar corset Other Brace/Splint: R AFO Restrictions Weight Bearing Restrictions: Yes RLE Weight Bearing: Weight bearing as tolerated LLE Weight Bearing: Weight bearing as tolerated Vital Signs Therapy Vitals Pulse Rate: 60 Oxygen Therapy SpO2: 91 % O2 Device: Not Delivered Pain Pain Assessment Pain Assessment: 0-10 Pain Score: 3  Pain Type: Surgical pain Pain Location: Back Pain Orientation: Mid Pain Descriptors / Indicators: Aching Patients Stated Pain Goal: 1 Pain Intervention(s): Medication (See eMAR) Vision/Perception     Cognition Orientation Level: Oriented X4 Memory: Impaired Memory Impairment: Retrieval deficit Awareness: Impaired Problem Solving: Impaired Safety/Judgment: Impaired Comments: decreased safety due to baseline memory deficits.  Sensation Sensation Light Touch: Appears Intact Hot/Cold: Appears  Intact Proprioception: Appears Intact Coordination Gross Motor Movements are Fluid and Coordinated:  No Fine Motor Movements are Fluid and Coordinated: Yes Coordination and Movement Description: decreased hip flexion strength limits LE coordination.   Motor  Motor Motor: Other (comment) Motor - Skilled Clinical Observations: Generalized weakness.  Motor - Discharge Observations: Generalized weakness  Mobility Bed Mobility Bed Mobility: Rolling Right;Rolling Left;Supine to Sit;Sit to Supine Rolling Right: 5: Supervision Rolling Right Details: Verbal cues for technique;Verbal cues for precautions/safety Rolling Left: 5: Supervision Rolling Left Details: Verbal cues for precautions/safety;Verbal cues for technique Supine to Sit: 5: Supervision Supine to Sit Details: Verbal cues for technique;Verbal cues for precautions/safety Sit to Supine: 4: Min assist Sit to Supine - Details: Verbal cues for technique;Verbal cues for precautions/safety;Manual facilitation for placement Transfers Transfers: Yes Sit to Stand: 5: Supervision Sit to Stand Details: Verbal cues for technique;Verbal cues for precautions/safety Stand to Sit: 5: Supervision Stand to Sit Details (indicate cue type and reason): Verbal cues for precautions/safety Stand Pivot Transfers: 4: Min assist Stand Pivot Transfer Details: Verbal cues for precautions/safety;Verbal cues for technique Locomotion  Ambulation Ambulation/Gait Assistance: 4: Min assist Ambulation Distance (Feet): 125 Feet Assistive device: Rolling walker Gait Gait: Yes Gait Pattern: Impaired Gait Pattern: Decreased step length - right;Decreased step length - left;Trendelenburg Stairs / Additional Locomotion Stairs: Yes Number of Stairs: 8 Height of Stairs: 3 Architect: Yes Wheelchair Assistance: 5: Investment banker, operational Details: Verbal cues for Marketing executive: Both upper extremities Wheelchair Parts Management: Needs assistance Distance: 142f  Trunk/Postural Assessment  Cervical  Assessment Cervical Assessment: Within Functional Limits Thoracic Assessment Thoracic Assessment: Within Functional Limits Lumbar Assessment Lumbar Assessment: Exceptions to [facility]Edward [facility] HospitalPostural Control Postural Control: Deficits on evaluation (posterior lean in sitting)  Balance Balance Balance Assessed: Yes Static Sitting Balance Static Sitting - Balance Support: Feet unsupported Static Sitting - Level of Assistance: 6: Modified independent (Device/Increase time) Static Sitting - Comment/# of Minutes: Sitting EOB Dynamic Sitting Balance Dynamic Sitting - Balance Support: Feet supported Dynamic Sitting - Level of Assistance: 6: Modified independent (Device/Increase time) Sitting balance - Comments: Sitting EOB to complete bathing/dressing Static Standing Balance Static Standing - Balance Support: Bilateral upper extremity supported Static Standing - Level of Assistance: 5: Stand by assistance (with RW ) Static Standing - Comment/# of Minutes: Standing at RW; min A required without UE support Dynamic Standing Balance Dynamic Standing - Balance Support: During functional activity Dynamic Standing - Level of Assistance: 4: Min assist (with RW) Dynamic Standing - Comments: Standing to complete LB dressing/ toileting tasks Extremity Assessment  RUE Assessment RUE Assessment: Exceptions to WFL (90 degree shoulder flexion due premorbid condition.  All other joints WFL) LUE Assessment LUE Assessment: Within Functional Limits RLE Assessment RLE Assessment: Exceptions to WCoteau Des Prairies HospitalRLE AROM (degrees) RLE Overall AROM Comments: patient able to perform hip flexion to ~105 degrees  RLE Strength RLE Overall Strength Comments: 3+/5 hip flexion. knee extension 5/5. all other motions 4+/5.  LLE Assessment LLE Assessment: Within Functional Limits LLE AROM (degrees) LLE Overall AROM Comments: Patient able to flex hip to ~100 degrees  LLE Strength LLE Overall Strength Comments: 3+/5 hip flexion. knee  extension 5/5. all other motions 4+/5.    See Function Navigator for Current Functional Status.  ALorie Phenix8/07/2015, 12:33 PM

## 2015-11-21 NOTE — Consult Note (Addendum)
WOC follow-up: Dr Cyndy Freeze of the neurosurgery team assessed post-op spinal incision wounds yesterday and ordered dry gauze dressings 4-5 times a day to control drainage.  Requested to reassess wounds by the primary team today; refer to previous progress notes from 8/1. There are 2 full thickness wounds over previous surgical site; located at 12:00 o'clock and 6:00 o'clock on the wound: .8X.1X.2cm lower and 2X.2X4cm. upper.  The upper wound is significantly deeper since the previous assessment and has mod amt yellow drainage when surrounding skin is pressed; reviewed plan care with patient and wife at bedside with dry dressing as ordered by neurosurgery and they deny further questions.  If there is an increase in drainage or change in appearance, then they should call Dr Melven Sartorius office.  They state they will obtain a follow-up appointment with him after discharge so he can continue to follow the wound post-op. Please re-consult if further assistance is needed.  Thank-you,  Julien Girt MSN, [place], [place], [place], [place]

## 2015-11-21 NOTE — Plan of Care (Signed)
Problem: RH Bed Mobility Goal: LTG Patient will perform bed mobility with assist (PT) LTG: Patient will perform bed mobility with assistance, with/without cues (PT).  Outcome: Adequate for Discharge Patient required min A for sit>supine due to continued LE weakness.   Problem: RH Ambulation Goal: LTG Patient will ambulate in controlled environment (PT) LTG: Patient will ambulate in a controlled environment, # of feet with assistance (PT).  Outcome: Not Met (add Reason) Increased knee pain prevents ambulation >133f.

## 2015-11-22 DIAGNOSIS — Z8739 Personal history of other diseases of the musculoskeletal system and connective tissue: Secondary | ICD-10-CM

## 2015-11-22 LAB — AEROBIC CULTURE W GRAM STAIN (SUPERFICIAL SPECIMEN): Culture: NORMAL

## 2015-11-22 NOTE — Progress Notes (Signed)
11/22/15 0930 nursing Patient discharged to home per wheelchair accompanied by NT and wife. Patient had a constipate episode before discharged but was able to go after suppository; Per wife he has miralax at home. RN advised wife to give it as needed to avoid straining and constipation. No other questions.

## 2015-11-22 NOTE — Progress Notes (Signed)
[facility] PHYSICAL MEDICINE & REHABILITATION     PROGRESS NOTE  Subjective/Complaints:  Pt laying in bed.  He remains disoriented, but remembers that he is to go home today.    ROS: Denies CP, SOB, N/V/D.  Objective: Vital Signs: Blood pressure 127/67, pulse 87, temperature 99 F (37.2 C), temperature source Oral, resp. rate 18, height 6\' 3"  (1.905 m), weight 127 kg (280 lb), SpO2 91 %. No results found.  Recent Labs  11/20/15 1032  WBC 7.3  HGB 10.5*  HCT 33.7*  PLT 345    Recent Labs  11/20/15 1032  NA 136  K 3.7  CL 105  GLUCOSE 116*  BUN 11  CREATININE 0.59*  CALCIUM 8.3*   CBG (last 3)  No results for input(s): GLUCAP in the last 72 hours.  Wt Readings from Last 3 Encounters:  11/19/15 127 kg (280 lb)  10/24/15 122.5 kg (270 lb)  10/16/15 122.5 kg (270 lb)    Physical Exam:  BP 127/67 (BP Location: Left Arm)   Pulse 87   Temp 99 F (37.2 C) (Oral)   Resp 18   Ht 6\' 3"  (1.905 m)   Wt 127 kg (280 lb)   SpO2 91%   BMI 35.00 kg/m  Constitutional: He appears well-developed and well-nourished.  HENT: Normocephalic and atraumatic.  Eyes: Conjunctivae are normal. Cardiovascular: Normal rate and regular rhythm.  Respiratory: Effort normal and breath sounds normal. No stridor. No respiratory distress. He has no wheezes.  GI: Soft. Bowel sounds are normal. He exhibits no distension. There is no tenderness.  Musculoskeletal: He exhibits min edema. He exhibits no tenderness.  Neurological: He is alert and oriented x1 (fluctuates).  Follows simple commands HOH Motor: B/l UE: 4+/5 grossly proximal to distal RLE: hip flexion, knee extension 5/5, ankle dorsi/plantarflexion 4-/5 (baseline) LLE: hip flexion, knee extension 5/5, ankle dorsi/plantar flexion 5/5  Skin: Skin is warm and dry. No erythema. Back incision with surgical dressing mild drainage.  Psychiatric: He has a normal mood and affect. Thought content normal.    Assessment/Plan: 1. Functional  deficits secondary to lumbar radiculopathy L2/3 and kyphosis with L4/5 spondylolisthesis with hx of right foot drop which require 3+ hours per day of interdisciplinary therapy in a comprehensive inpatient rehab setting. Physiatrist is providing close team supervision and 24 hour management of active medical problems listed below. Physiatrist and rehab team continue to assess barriers to discharge/monitor patient progress toward functional and medical goals.  Function:  Bathing Bathing position   Position: Sitting EOB  Bathing parts Body parts bathed by patient: Right arm, Left arm, Chest, Abdomen, Front perineal area, Buttocks, Right upper leg, Left upper leg, Right lower leg, Left lower leg Body parts bathed by helper: Back  Bathing assist Assist Level: Touching or steadying assistance(Pt > 75%)      Upper Body Dressing/Undressing Upper body dressing   What is the patient wearing?: Pull over shirt/dress, Orthosis     Pull over shirt/dress - Perfomed by patient: Thread/unthread right sleeve, Thread/unthread left sleeve, Put head through opening, Pull shirt over trunk Pull over shirt/dress - Perfomed by helper: Pull shirt over trunk Button up shirt - Perfomed by patient: Thread/unthread right sleeve, Thread/unthread left sleeve, Button/unbutton shirt Button up shirt - Perfomed by helper: Pull shirt around back Orthosis activity level: Performed by patient  Upper body assist Assist Level: Set up, Supervision or verbal cues   Set up : To obtain clothing/put away  Lower Body Dressing/Undressing Lower body dressing  What is the patient wearing?: Pants, Underwear, Ted Hose, AFO, Shoes Underwear - Performed by patient: Thread/unthread right underwear leg, Thread/unthread left underwear leg, Pull underwear up/down Underwear - Performed by helper: Pull underwear up/down Pants- Performed by patient: Thread/unthread right pants leg, Thread/unthread left pants leg, Pull pants up/down Pants-  Performed by helper: Pull pants up/down, Thread/unthread right pants leg, Thread/unthread left pants leg           Shoes - Performed by helper: Don/doff right shoe, Don/doff left shoe, Fasten right, Fasten left AFO - Performed by patient: Don/doff right AFO AFO - Performed by helper: Don/doff right AFO   TED Hose - Performed by helper: Don/doff right TED hose, Don/doff left TED hose  Lower body assist Assist for lower body dressing: Touching or steadying assistance (Pt > 75%)      Toileting Toileting Toileting activity did not occur: No continent bowel/bladder event Toileting steps completed by patient: Adjust clothing prior to toileting, Performs perineal hygiene, Adjust clothing after toileting Toileting steps completed by helper: Adjust clothing after toileting Toileting Assistive Devices: Other (comment) (RW)  Toileting assist Assist level: Touching or steadying assistance (Pt.75%)   Transfers Chair/bed transfer   Chair/bed transfer method: Stand pivot Chair/bed transfer assist level: Touching or steadying assistance (Pt > 75%) Chair/bed transfer assistive device: Armrests, Medical sales representative     Max distance: 177ft Assist level: Touching or steadying assistance (Pt > 75%)   Wheelchair   Type: Manual Max wheelchair distance: 120fg Assist Level: No help, No cues, assistive device, takes more than reasonable amount of time  Cognition Comprehension Comprehension assist level: Understands basic 75 - 89% of the time/ requires cueing 10 - 24% of the time  Expression Expression assist level: Expresses basic 75 - 89% of the time/requires cueing 10 - 24% of the time. Needs helper to occlude trach/needs to repeat words.  Social Interaction Social Interaction assist level: Interacts appropriately 90% of the time - Needs monitoring or encouragement for participation or interaction.  Problem Solving Problem solving assist level: Solves basic 75 - 89% of the  time/requires cueing 10 - 24% of the time  Memory Memory assist level: Recognizes or recalls 50 - 74% of the time/requires cueing 25 - 49% of the time     Medical Problem List and Plan: 1. Gait and cognitive deficits with weakness secondary to lumbar radiculopathy L2/3 and kyphosis with L4/5 spondylolisthesis with hx of right foot drop  D/c today  Will see pt in 1-2 weeks for transitional care management, post-discharge 2. DVT Prophylaxis/Anticoagulation: Pharmaceutical: Lovenox added as 5 days post op.  3. Pain Management:  Ultram qid for now, d/ced on 7/19 due to AMS   Increased tylenol on 7/24, changed to 500mg  4/day PRN 4. Mood: LCSW to follow for evaluation and support.  5. Neuropsych: This patient is not capable of making decisions on his own behalf.  Klonipin d/ced  Seroquel 12.5 qhs d/ced on 7/26  Trazodone 25mg  PRN, pt medication sensitive  CT head from 7/20 reviewed, no acute changes, small vessel disease  Ammonia normal 6. Skin/Wound Care: Monitor wound for healing. Maintain adequate nutrition and hydration status.   Surgical wound infection. Appreciate Neurosurg and ID recs. Increased drainage noted 8/3, Neurosurg evaluated, cultures remain pending, restarted abx. 7. Fluids/Electrolytes/Nutrition: Monitor I/O.   Hyponatremia: Resolved 136 on 7/28 8. HTN: Monitor BP bid. On atenolol.   D/ced Norvasc 9. Ureteral stricture: Currently voiding without difficulty Monitor I/O.  No I/O Cath, per  Urology due to potential surgery and risk for further injury.   -condom cath HS 10. Ankle fracture: Xray reviewed on 7/25 with no healing of fracture.  Per Ortho, d/c CAM boot, WBAT. 11. Depression/Anxiety: Cymbalta d/ced due to ?side effects, although many other medications and infection also concomitant  12. GERD: On protonix.  13. Asthma: Encourage IS. On dulera for hyperactive airway.  14. Constipation: Increased miralax to bid. 15. CAD: Cont meds 16.  Obesity: Body mass index is 33.75 kg/(m^2) on admission, diet and exercise education, encourage weight loss to increase endurance and promote overall health 17. Right ankle fracture: Cont boot 18. Chronic back pain: With severe central canal stenosis and foraminal narrowing: Cont meds 19. Right RTC tear and DJD right shoulder: Cont meds 20. Cognitive deficits with difficulty processing: Likely secondary to narcotics +/- infection. 21. Leukocytosis: Resolved  Wound cx with Pseudomonas and Klebsiella, Levaquin stopped 7/27  WBCs 8.2 on 7/28  Abx d/ced per ID  UA neg, Ucx multiple species 22. ABLA  Hb 10.2 on 7/28  Cont to monitor 13. Confusion: Improving.   ?Baseline dementia   Likely secondary to infection +/- medications  Will cont to monitor, medications also reviewed, pain and sleep medications adjusted. 14. Nocturnal urinary incontinence  Cont condom cath to avoid nighttime awakenings and improve sleep  LOS (Days) 24 A FACE TO FACE EVALUATION WAS PERFORMED  Ankit [name] 11/22/2015 8:30 AM

## 2015-11-23 DIAGNOSIS — S82891D Other fracture of right lower leg, subsequent encounter for closed fracture with routine healing: Secondary | ICD-10-CM | POA: Diagnosis not present

## 2015-11-23 DIAGNOSIS — B965 Pseudomonas (aeruginosa) (mallei) (pseudomallei) as the cause of diseases classified elsewhere: Secondary | ICD-10-CM | POA: Diagnosis not present

## 2015-11-23 DIAGNOSIS — T814XXA Infection following a procedure, initial encounter: Secondary | ICD-10-CM | POA: Diagnosis not present

## 2015-11-23 DIAGNOSIS — M4800 Spinal stenosis, site unspecified: Secondary | ICD-10-CM | POA: Diagnosis not present

## 2015-11-23 DIAGNOSIS — B961 Klebsiella pneumoniae [K. pneumoniae] as the cause of diseases classified elsewhere: Secondary | ICD-10-CM | POA: Diagnosis not present

## 2015-11-23 DIAGNOSIS — M5116 Intervertebral disc disorders with radiculopathy, lumbar region: Secondary | ICD-10-CM | POA: Diagnosis not present

## 2015-11-23 LAB — URINE CULTURE: Culture: NO GROWTH

## 2015-11-24 DIAGNOSIS — F05 Delirium due to known physiological condition: Secondary | ICD-10-CM

## 2015-11-24 DIAGNOSIS — R410 Disorientation, unspecified: Secondary | ICD-10-CM

## 2015-11-25 ENCOUNTER — Telehealth: Payer: Self-pay

## 2015-11-25 NOTE — Telephone Encounter (Signed)
Called to get verbal to continue nursing care.  Gave verbal.

## 2015-11-26 DIAGNOSIS — T814XXA Infection following a procedure, initial encounter: Secondary | ICD-10-CM | POA: Diagnosis not present

## 2015-11-26 DIAGNOSIS — M5116 Intervertebral disc disorders with radiculopathy, lumbar region: Secondary | ICD-10-CM | POA: Diagnosis not present

## 2015-11-26 DIAGNOSIS — M4800 Spinal stenosis, site unspecified: Secondary | ICD-10-CM | POA: Diagnosis not present

## 2015-11-26 DIAGNOSIS — B965 Pseudomonas (aeruginosa) (mallei) (pseudomallei) as the cause of diseases classified elsewhere: Secondary | ICD-10-CM | POA: Diagnosis not present

## 2015-11-26 DIAGNOSIS — S82891D Other fracture of right lower leg, subsequent encounter for closed fracture with routine healing: Secondary | ICD-10-CM | POA: Diagnosis not present

## 2015-11-26 DIAGNOSIS — B961 Klebsiella pneumoniae [K. pneumoniae] as the cause of diseases classified elsewhere: Secondary | ICD-10-CM | POA: Diagnosis not present

## 2015-11-26 NOTE — Patient Care Conference (Signed)
Inpatient RehabilitationTeam Conference and Plan of Care Update Date: 11/12/2015   Time: 2:00 PM    Patient Name: Mitchell [name]      Medical Record Number: [id number]  Date of Birth: June 26, 1934 Sex: Male         Room/Bed: 4W12C/4W12C-01 Payor Info: Payor: MEDICARE / Plan: MEDICARE PART A AND B / Product Type: *No Product type* /    Admitting Diagnosis: Lumbar Fusion  Admit Date/Time:  10/29/2015  6:03 PM Admission Comments: No comment available   Primary Diagnosis:  Intervertebral disc disorder with radiculopathy of lumbar region Principal Problem: Intervertebral disc disorder with radiculopathy of lumbar region  Patient Active Problem List   Diagnosis Date Noted  . Subacute confusional state 11/24/2015  . History of right foot drop 11/21/2015  . Labile blood pressure   . Wound drainage   . Nocturnal enuresis   . Fracture   . Confusion   . Sleep disturbance   . Agitation   . Surgical wound infection   . Debility   . Postoperative confusion   . Surgical wound dehiscence   . Hyponatremia   . Leukocytosis   . Acute blood loss anemia   . Abnormality of gait   . Intervertebral disc disorder with radiculopathy of lumbar region 10/29/2015  . Benign essential HTN   . Adjustment disorder with mixed anxiety and depressed mood   . Gastroesophageal reflux disease without esophagitis   . Asthma   . Chronic back pain   . Surgery, other elective   . Coronary artery disease involving native coronary artery of native heart without angina pectoris   . Atrial flutter ([place])   . Obesity   . Ureteral stricture   . Right foot drop   . Ankle fracture, right   . Osteoarthritis of spine with radiculopathy, lumbosacral region   . Rotator cuff tear   . Primary osteoarthritis of right shoulder   . Post-operative pain   . Cognitive deficits   . Lumbar stenosis with neurogenic claudication 10/24/2015  . Status post laminectomy with spinal fusion 10/24/2015  . Atrial flutter with RVR- s/p TEE  CV Feb 2013 05/25/2011  . Anticoagulant causing adverse effect in therapeutic use 05/25/2011  . LV dysfunction, EF 45% in AF- >55% May 2013 in NSR 05/25/2011  . CHF, acute, mild secondary to EF 40-45% an a. flutter with RVR 05/25/2011  . Erectile dysfunction 05/16/2011  . Low testosterone 05/16/2011  . BPH (benign prostatic hyperplasia) 05/16/2011  . History of recurrent urinary tract infection 05/16/2011  . Spinal stenosis 05/16/2011  . HTN (hypertension), 09/17/2010  . Hyperlipidemia 09/17/2010  . CAD- BMS to PLA 2001, 70% mid LAD, 50-60% prox. LCX, 60% PDA- Low risk Myoview Jan 2013 09/17/2010  . Morbid obesity ([place]) 09/17/2010  . Sleep apnea, intolerant to cpap 09/17/2010  . Pituitary microadenoma with hyperprolactinemia ([place]) 09/17/2010  . Asthma 09/17/2010  . Osteoarthritis 09/17/2010  . History of melanoma in situ 09/17/2010  . Hx of adenomatous colonic polyps 09/17/2010    Expected Discharge Date: Expected Discharge Date: 11/22/15  Team Members Present: Physician leading conference: Dr. Delice Lesch Social Worker Present: Ovidio Kin, LCSW Nurse Present: Heather Roberts, RN PT Present: Barrie Folk, PT;Victoria Sabra Heck, PT OT Present: Simonne Come, Artemio Aly, OT SLP Present: Gunnar Fusi, SLP PPS Coordinator present : Daiva Nakayama, RN, CRRN     Current Status/Progress Goal Weekly Team Focus  Medical     Cognitive deficits, impeding his progress in therapies. HX-Right foot drop-AFO Lumbar radiculopathy  L2/3 and kyphosis with L4/5   Improve mobility, transfers, and cognition-decrease pain meds   see above  Bowel/Bladder     cont bowel and bladder-wearing condom cath at night for sleep   cont B & B    monitor history of bladder issues needs surgery according to wife  Swallow/Nutrition/ Hydration             ADL's     max with LB self care and min-mod with stand and transfers   min with self care and transfers and ADL's   ADL retraiining with AE-pt and family  education with functional mobility  Mobility     ambulates max 20-40 ft. Very confused and impulsive.  Poor endurance   min level for ambulation and supervision for sitting and standing balance   pt/family education for safety, awareness, gait training and endurance  Communication             Safety/Cognition/ Behavioral Observations            Pain     pain managed   less than 3   monitor MD decreasing due to confusion  Skin     lumbar incision draining-WOC involved   WOC advising on dressing changes and may place wound vac on   assess q shift and monitor addressing wound issues     *See Care Plan and progress notes for long and short-term goals.  Barriers to Discharge:   surgical wound and infections-wound vac placed, pain and confusion   Possible Resolutions to Barriers:    IV antibiotics, wound vac, Neuro-surg consult and ID consult-decrease pain meds for confusion   Discharge Planning/Teaching Needs:    Home with wife who is here daily and participating in therapies. Question will be whether wife can mange him at home. She wants too take him home, needs at least min level     Team Discussion:  IV antibiotics and wound vac placed, confusion possible some pre-morbid mental issues. Cam boot being addressed via ortho MD. WB needs to be clarified via MD also. If pt confusion lessened can do better in therapies and wife may be able to manage him at home.  Revisions to Treatment Plan:  Medical issues being addressed-complex      Elease Hashimoto 11/26/2015, 1:52 PM

## 2015-11-27 ENCOUNTER — Other Ambulatory Visit: Payer: Medicare Other | Admitting: Internal Medicine

## 2015-11-27 ENCOUNTER — Telehealth: Payer: Self-pay | Admitting: *Deleted

## 2015-11-27 NOTE — Telephone Encounter (Signed)
Physical therapist from [email or web address]  called asking for verbal orders for home health physical therapy 2week1, 3week2, and 2week5. Verbal order given per office protocol

## 2015-11-28 DIAGNOSIS — B965 Pseudomonas (aeruginosa) (mallei) (pseudomallei) as the cause of diseases classified elsewhere: Secondary | ICD-10-CM | POA: Diagnosis not present

## 2015-11-28 DIAGNOSIS — S82891D Other fracture of right lower leg, subsequent encounter for closed fracture with routine healing: Secondary | ICD-10-CM | POA: Diagnosis not present

## 2015-11-28 DIAGNOSIS — T814XXA Infection following a procedure, initial encounter: Secondary | ICD-10-CM | POA: Diagnosis not present

## 2015-11-28 DIAGNOSIS — B961 Klebsiella pneumoniae [K. pneumoniae] as the cause of diseases classified elsewhere: Secondary | ICD-10-CM | POA: Diagnosis not present

## 2015-11-28 DIAGNOSIS — M4800 Spinal stenosis, site unspecified: Secondary | ICD-10-CM | POA: Diagnosis not present

## 2015-11-28 DIAGNOSIS — M5116 Intervertebral disc disorders with radiculopathy, lumbar region: Secondary | ICD-10-CM | POA: Diagnosis not present

## 2015-12-01 ENCOUNTER — Other Ambulatory Visit: Payer: Medicare Other | Admitting: Internal Medicine

## 2015-12-01 DIAGNOSIS — B961 Klebsiella pneumoniae [K. pneumoniae] as the cause of diseases classified elsewhere: Secondary | ICD-10-CM | POA: Diagnosis not present

## 2015-12-01 DIAGNOSIS — E119 Type 2 diabetes mellitus without complications: Secondary | ICD-10-CM

## 2015-12-01 DIAGNOSIS — S82891D Other fracture of right lower leg, subsequent encounter for closed fracture with routine healing: Secondary | ICD-10-CM | POA: Diagnosis not present

## 2015-12-01 DIAGNOSIS — M4806 Spinal stenosis, lumbar region: Secondary | ICD-10-CM | POA: Diagnosis not present

## 2015-12-01 DIAGNOSIS — M4800 Spinal stenosis, site unspecified: Secondary | ICD-10-CM | POA: Diagnosis not present

## 2015-12-01 DIAGNOSIS — E785 Hyperlipidemia, unspecified: Secondary | ICD-10-CM | POA: Diagnosis not present

## 2015-12-01 DIAGNOSIS — M545 Low back pain: Secondary | ICD-10-CM | POA: Diagnosis not present

## 2015-12-01 DIAGNOSIS — M5116 Intervertebral disc disorders with radiculopathy, lumbar region: Secondary | ICD-10-CM | POA: Diagnosis not present

## 2015-12-01 DIAGNOSIS — B965 Pseudomonas (aeruginosa) (mallei) (pseudomallei) as the cause of diseases classified elsewhere: Secondary | ICD-10-CM | POA: Diagnosis not present

## 2015-12-01 DIAGNOSIS — M5126 Other intervertebral disc displacement, lumbar region: Secondary | ICD-10-CM | POA: Diagnosis not present

## 2015-12-01 DIAGNOSIS — T814XXA Infection following a procedure, initial encounter: Secondary | ICD-10-CM | POA: Diagnosis not present

## 2015-12-01 DIAGNOSIS — M5416 Radiculopathy, lumbar region: Secondary | ICD-10-CM | POA: Diagnosis not present

## 2015-12-01 LAB — LIPID PANEL
Cholesterol: 163 mg/dL (ref 125–200)
HDL: 38 mg/dL — ABNORMAL LOW (ref >=40)
LDL Cholesterol: 99 mg/dL (ref <130)
Total CHOL/HDL Ratio: 4.3 Ratio (ref <=5.0)
Triglycerides: 132 mg/dL (ref <150)
VLDL: 26 mg/dL (ref <30)

## 2015-12-02 ENCOUNTER — Encounter: Payer: Self-pay | Admitting: Internal Medicine

## 2015-12-02 ENCOUNTER — Ambulatory Visit (INDEPENDENT_AMBULATORY_CARE_PROVIDER_SITE_OTHER): Payer: Medicare Other | Admitting: Internal Medicine

## 2015-12-02 ENCOUNTER — Ambulatory Visit: Payer: Medicare Other | Admitting: Internal Medicine

## 2015-12-02 VITALS — BP 104/68 | HR 68 | Temp 98.4°F

## 2015-12-02 DIAGNOSIS — Z09 Encounter for follow-up examination after completed treatment for conditions other than malignant neoplasm: Secondary | ICD-10-CM

## 2015-12-02 DIAGNOSIS — I519 Heart disease, unspecified: Secondary | ICD-10-CM | POA: Diagnosis not present

## 2015-12-02 DIAGNOSIS — Z87440 Personal history of urinary (tract) infections: Secondary | ICD-10-CM

## 2015-12-02 DIAGNOSIS — IMO0001 Reserved for inherently not codable concepts without codable children: Secondary | ICD-10-CM

## 2015-12-02 DIAGNOSIS — T814XXD Infection following a procedure, subsequent encounter: Secondary | ICD-10-CM | POA: Diagnosis not present

## 2015-12-02 DIAGNOSIS — E785 Hyperlipidemia, unspecified: Secondary | ICD-10-CM | POA: Diagnosis not present

## 2015-12-02 DIAGNOSIS — Z0000 Encounter for general adult medical examination without abnormal findings: Secondary | ICD-10-CM

## 2015-12-02 DIAGNOSIS — S99911A Unspecified injury of right ankle, initial encounter: Secondary | ICD-10-CM | POA: Diagnosis not present

## 2015-12-02 LAB — HEMOGLOBIN A1C
Hgb A1c MFr Bld: 4.8 % (ref <5.7)
Mean Plasma Glucose: 91 mg/dL

## 2015-12-02 LAB — POCT URINALYSIS DIPSTICK
Bilirubin, UA: NEGATIVE
Blood, UA: NEGATIVE
Glucose, UA: NEGATIVE
Ketones, UA: NEGATIVE
Leukocytes, UA: NEGATIVE
Nitrite, UA: NEGATIVE
Protein, UA: NEGATIVE
Spec Grav, UA: 1.015
Urobilinogen, UA: 0.2
pH, UA: 5

## 2015-12-03 DIAGNOSIS — M4800 Spinal stenosis, site unspecified: Secondary | ICD-10-CM | POA: Diagnosis not present

## 2015-12-03 DIAGNOSIS — S82891D Other fracture of right lower leg, subsequent encounter for closed fracture with routine healing: Secondary | ICD-10-CM | POA: Diagnosis not present

## 2015-12-03 DIAGNOSIS — M5116 Intervertebral disc disorders with radiculopathy, lumbar region: Secondary | ICD-10-CM | POA: Diagnosis not present

## 2015-12-03 DIAGNOSIS — T814XXA Infection following a procedure, initial encounter: Secondary | ICD-10-CM | POA: Diagnosis not present

## 2015-12-03 DIAGNOSIS — B965 Pseudomonas (aeruginosa) (mallei) (pseudomallei) as the cause of diseases classified elsewhere: Secondary | ICD-10-CM | POA: Diagnosis not present

## 2015-12-03 DIAGNOSIS — B961 Klebsiella pneumoniae [K. pneumoniae] as the cause of diseases classified elsewhere: Secondary | ICD-10-CM | POA: Diagnosis not present

## 2015-12-04 ENCOUNTER — Encounter: Payer: Medicare Other | Admitting: Physical Medicine & Rehabilitation

## 2015-12-04 DIAGNOSIS — S82891D Other fracture of right lower leg, subsequent encounter for closed fracture with routine healing: Secondary | ICD-10-CM | POA: Diagnosis not present

## 2015-12-04 DIAGNOSIS — M4800 Spinal stenosis, site unspecified: Secondary | ICD-10-CM | POA: Diagnosis not present

## 2015-12-04 DIAGNOSIS — T814XXA Infection following a procedure, initial encounter: Secondary | ICD-10-CM | POA: Diagnosis not present

## 2015-12-04 DIAGNOSIS — B961 Klebsiella pneumoniae [K. pneumoniae] as the cause of diseases classified elsewhere: Secondary | ICD-10-CM | POA: Diagnosis not present

## 2015-12-04 DIAGNOSIS — B965 Pseudomonas (aeruginosa) (mallei) (pseudomallei) as the cause of diseases classified elsewhere: Secondary | ICD-10-CM | POA: Diagnosis not present

## 2015-12-04 DIAGNOSIS — M5116 Intervertebral disc disorders with radiculopathy, lumbar region: Secondary | ICD-10-CM | POA: Diagnosis not present

## 2015-12-05 DIAGNOSIS — B965 Pseudomonas (aeruginosa) (mallei) (pseudomallei) as the cause of diseases classified elsewhere: Secondary | ICD-10-CM | POA: Diagnosis not present

## 2015-12-05 DIAGNOSIS — T814XXA Infection following a procedure, initial encounter: Secondary | ICD-10-CM | POA: Diagnosis not present

## 2015-12-05 DIAGNOSIS — M5116 Intervertebral disc disorders with radiculopathy, lumbar region: Secondary | ICD-10-CM | POA: Diagnosis not present

## 2015-12-05 DIAGNOSIS — B961 Klebsiella pneumoniae [K. pneumoniae] as the cause of diseases classified elsewhere: Secondary | ICD-10-CM | POA: Diagnosis not present

## 2015-12-05 DIAGNOSIS — S82891D Other fracture of right lower leg, subsequent encounter for closed fracture with routine healing: Secondary | ICD-10-CM | POA: Diagnosis not present

## 2015-12-05 DIAGNOSIS — M4800 Spinal stenosis, site unspecified: Secondary | ICD-10-CM | POA: Diagnosis not present

## 2015-12-08 DIAGNOSIS — M5116 Intervertebral disc disorders with radiculopathy, lumbar region: Secondary | ICD-10-CM | POA: Diagnosis not present

## 2015-12-08 DIAGNOSIS — T814XXA Infection following a procedure, initial encounter: Secondary | ICD-10-CM | POA: Diagnosis not present

## 2015-12-08 DIAGNOSIS — M4800 Spinal stenosis, site unspecified: Secondary | ICD-10-CM | POA: Diagnosis not present

## 2015-12-08 DIAGNOSIS — S82891D Other fracture of right lower leg, subsequent encounter for closed fracture with routine healing: Secondary | ICD-10-CM | POA: Diagnosis not present

## 2015-12-08 DIAGNOSIS — B965 Pseudomonas (aeruginosa) (mallei) (pseudomallei) as the cause of diseases classified elsewhere: Secondary | ICD-10-CM | POA: Diagnosis not present

## 2015-12-08 DIAGNOSIS — B961 Klebsiella pneumoniae [K. pneumoniae] as the cause of diseases classified elsewhere: Secondary | ICD-10-CM | POA: Diagnosis not present

## 2015-12-09 DIAGNOSIS — B965 Pseudomonas (aeruginosa) (mallei) (pseudomallei) as the cause of diseases classified elsewhere: Secondary | ICD-10-CM | POA: Diagnosis not present

## 2015-12-09 DIAGNOSIS — B961 Klebsiella pneumoniae [K. pneumoniae] as the cause of diseases classified elsewhere: Secondary | ICD-10-CM | POA: Diagnosis not present

## 2015-12-09 DIAGNOSIS — S82891D Other fracture of right lower leg, subsequent encounter for closed fracture with routine healing: Secondary | ICD-10-CM | POA: Diagnosis not present

## 2015-12-09 DIAGNOSIS — M5116 Intervertebral disc disorders with radiculopathy, lumbar region: Secondary | ICD-10-CM | POA: Diagnosis not present

## 2015-12-09 DIAGNOSIS — T814XXA Infection following a procedure, initial encounter: Secondary | ICD-10-CM | POA: Diagnosis not present

## 2015-12-09 DIAGNOSIS — M4800 Spinal stenosis, site unspecified: Secondary | ICD-10-CM | POA: Diagnosis not present

## 2015-12-10 DIAGNOSIS — M5116 Intervertebral disc disorders with radiculopathy, lumbar region: Secondary | ICD-10-CM | POA: Diagnosis not present

## 2015-12-10 DIAGNOSIS — S82891D Other fracture of right lower leg, subsequent encounter for closed fracture with routine healing: Secondary | ICD-10-CM | POA: Diagnosis not present

## 2015-12-10 DIAGNOSIS — T814XXA Infection following a procedure, initial encounter: Secondary | ICD-10-CM | POA: Diagnosis not present

## 2015-12-10 DIAGNOSIS — B961 Klebsiella pneumoniae [K. pneumoniae] as the cause of diseases classified elsewhere: Secondary | ICD-10-CM | POA: Diagnosis not present

## 2015-12-10 DIAGNOSIS — B965 Pseudomonas (aeruginosa) (mallei) (pseudomallei) as the cause of diseases classified elsewhere: Secondary | ICD-10-CM | POA: Diagnosis not present

## 2015-12-10 DIAGNOSIS — M4800 Spinal stenosis, site unspecified: Secondary | ICD-10-CM | POA: Diagnosis not present

## 2015-12-11 DIAGNOSIS — S82891D Other fracture of right lower leg, subsequent encounter for closed fracture with routine healing: Secondary | ICD-10-CM | POA: Diagnosis not present

## 2015-12-11 DIAGNOSIS — T814XXA Infection following a procedure, initial encounter: Secondary | ICD-10-CM | POA: Diagnosis not present

## 2015-12-11 DIAGNOSIS — B961 Klebsiella pneumoniae [K. pneumoniae] as the cause of diseases classified elsewhere: Secondary | ICD-10-CM | POA: Diagnosis not present

## 2015-12-11 DIAGNOSIS — M4800 Spinal stenosis, site unspecified: Secondary | ICD-10-CM | POA: Diagnosis not present

## 2015-12-11 DIAGNOSIS — B965 Pseudomonas (aeruginosa) (mallei) (pseudomallei) as the cause of diseases classified elsewhere: Secondary | ICD-10-CM | POA: Diagnosis not present

## 2015-12-11 DIAGNOSIS — M5116 Intervertebral disc disorders with radiculopathy, lumbar region: Secondary | ICD-10-CM | POA: Diagnosis not present

## 2015-12-12 DIAGNOSIS — M5116 Intervertebral disc disorders with radiculopathy, lumbar region: Secondary | ICD-10-CM | POA: Diagnosis not present

## 2015-12-12 DIAGNOSIS — B965 Pseudomonas (aeruginosa) (mallei) (pseudomallei) as the cause of diseases classified elsewhere: Secondary | ICD-10-CM | POA: Diagnosis not present

## 2015-12-12 DIAGNOSIS — B961 Klebsiella pneumoniae [K. pneumoniae] as the cause of diseases classified elsewhere: Secondary | ICD-10-CM | POA: Diagnosis not present

## 2015-12-12 DIAGNOSIS — M4800 Spinal stenosis, site unspecified: Secondary | ICD-10-CM | POA: Diagnosis not present

## 2015-12-12 DIAGNOSIS — T814XXA Infection following a procedure, initial encounter: Secondary | ICD-10-CM | POA: Diagnosis not present

## 2015-12-12 DIAGNOSIS — S82891D Other fracture of right lower leg, subsequent encounter for closed fracture with routine healing: Secondary | ICD-10-CM | POA: Diagnosis not present

## 2015-12-15 DIAGNOSIS — B961 Klebsiella pneumoniae [K. pneumoniae] as the cause of diseases classified elsewhere: Secondary | ICD-10-CM | POA: Diagnosis not present

## 2015-12-15 DIAGNOSIS — M4800 Spinal stenosis, site unspecified: Secondary | ICD-10-CM | POA: Diagnosis not present

## 2015-12-15 DIAGNOSIS — S82891D Other fracture of right lower leg, subsequent encounter for closed fracture with routine healing: Secondary | ICD-10-CM | POA: Diagnosis not present

## 2015-12-15 DIAGNOSIS — B965 Pseudomonas (aeruginosa) (mallei) (pseudomallei) as the cause of diseases classified elsewhere: Secondary | ICD-10-CM | POA: Diagnosis not present

## 2015-12-15 DIAGNOSIS — S8261XD Displaced fracture of lateral malleolus of right fibula, subsequent encounter for closed fracture with routine healing: Secondary | ICD-10-CM | POA: Diagnosis not present

## 2015-12-15 DIAGNOSIS — M5116 Intervertebral disc disorders with radiculopathy, lumbar region: Secondary | ICD-10-CM | POA: Diagnosis not present

## 2015-12-15 DIAGNOSIS — T814XXA Infection following a procedure, initial encounter: Secondary | ICD-10-CM | POA: Diagnosis not present

## 2015-12-16 ENCOUNTER — Telehealth: Payer: Self-pay

## 2015-12-16 DIAGNOSIS — S82891D Other fracture of right lower leg, subsequent encounter for closed fracture with routine healing: Secondary | ICD-10-CM | POA: Diagnosis not present

## 2015-12-16 DIAGNOSIS — T814XXA Infection following a procedure, initial encounter: Secondary | ICD-10-CM | POA: Diagnosis not present

## 2015-12-16 DIAGNOSIS — M4800 Spinal stenosis, site unspecified: Secondary | ICD-10-CM | POA: Diagnosis not present

## 2015-12-16 DIAGNOSIS — B965 Pseudomonas (aeruginosa) (mallei) (pseudomallei) as the cause of diseases classified elsewhere: Secondary | ICD-10-CM | POA: Diagnosis not present

## 2015-12-16 DIAGNOSIS — M5116 Intervertebral disc disorders with radiculopathy, lumbar region: Secondary | ICD-10-CM | POA: Diagnosis not present

## 2015-12-16 DIAGNOSIS — B961 Klebsiella pneumoniae [K. pneumoniae] as the cause of diseases classified elsewhere: Secondary | ICD-10-CM | POA: Diagnosis not present

## 2015-12-16 NOTE — Telephone Encounter (Signed)
RN, Tammy called to let Dr. Renold Genta know they will be referring patient to the wound care center.

## 2015-12-17 DIAGNOSIS — B965 Pseudomonas (aeruginosa) (mallei) (pseudomallei) as the cause of diseases classified elsewhere: Secondary | ICD-10-CM | POA: Diagnosis not present

## 2015-12-17 DIAGNOSIS — S82891D Other fracture of right lower leg, subsequent encounter for closed fracture with routine healing: Secondary | ICD-10-CM | POA: Diagnosis not present

## 2015-12-17 DIAGNOSIS — M5116 Intervertebral disc disorders with radiculopathy, lumbar region: Secondary | ICD-10-CM | POA: Diagnosis not present

## 2015-12-17 DIAGNOSIS — B961 Klebsiella pneumoniae [K. pneumoniae] as the cause of diseases classified elsewhere: Secondary | ICD-10-CM | POA: Diagnosis not present

## 2015-12-17 DIAGNOSIS — M4800 Spinal stenosis, site unspecified: Secondary | ICD-10-CM | POA: Diagnosis not present

## 2015-12-17 DIAGNOSIS — T814XXA Infection following a procedure, initial encounter: Secondary | ICD-10-CM | POA: Diagnosis not present

## 2015-12-17 NOTE — Progress Notes (Signed)
Subjective:    Patient ID: Mitchell [name], male    DOB: 05-02-1934, 81 y.o.   MRN: [id number]  HPI 81 year old White Male with severe lumbar stenosis who underwent lumbar surgery by Dr. Vertell Limber July 7 with subsequent admission to rehabilitation unit at [facility] on July 12 in today for hospital follow-up. He underwent decompression  L2-L3 with right microdiscectomy, redo/ decompression/ fusion L4-5, L5-S1 and instrumentation L2-S1 with posterior arthrodesis. He was discharged August 5. He has of part of his incision which is open and draining. It has to be cleaned and dressed daily. Family has been doing this with some help from home health nurse. Rehabilitation course was complicated by some confusion likely related to being in the hospital and medication. This has cleared. He is somewhat emotional today. Prior to his surgery we has started him on Cymbalta for back pain and he should be continuing that nail. I think it will help cause he's mildly depressed that he's not making much progress.  He also has a healing fractured ankle that occurred May 31st. We will make sure he gets seen by orthopedist Erlinda Hong) for follow-up of that. This happened when he had a fall here outside my office after of calling to say he was suffering from an episode of hives and needed to be seen. While getting out of the car, he fell due to gait instability from his back pain. This was prior to his surgery. This was a right distal fibular fracture. It was x-rayed on July 25 and showed no significant healing.  He is accompanied by his wife today and his daughter who is a Animal nutritionist. She has questions about how the wound should be cleaned. We went over this today. I have given them some Betadine swabs to use. He was seen by infectious disease consultant in the hospital for the surgical site infection.  He had Gram stain on August 3 of area that was open. He had rare white blood cell present, rare gram variable rod and rare  gram-positive rod. Culture grew normal skin flora. He also had an aerobic culture of his surgical site incision on July 16 that showed moderate white blood cells, mainly polys, few gram-negative rods, rare gram positive cocci in clusters. Abundant Pseudomonas aeruginosa grew along with moderate Klebsiella pneumoniae. Organisms were sensitive to Cipro which he was treated with.  Urine dipstick today is unremarkable except for cloudy appearance. No nitrite or LE present. No blood present.  He has a history of urethral stricture and has had urinary tract infections in the past. Dr. Roni Bread is his urologist here in town. He was scheduled to have some urology surgery at [facility] but that has been postponed.  He has a history of morbid obesity, hyperlipidemia, essential hypertension, history of asthma, GE reflux, coronary disease followed by Dr. Gwenlyn Found, history of atrial flutter, history of right foot drop, sleep apnea intolerant of C-Pap, pituitary microadenoma with hyperprolactinemia followed by Dr. Chalmers Cater  He is a cattleman. Married with 2 adult children. Does not smoke or consume alcohol.  Review of Systems as above     Objective:   Physical Exam Tearful in the office today when talking about his recovery being slow. He is mildly depressed. Chest is clear to auscultation. Cardiac exam regular rate and rhythm normal S1 and S2. He is alert cooperative and oriented. One plus lower extremity edema.       Assessment & Plan:  Depression  Status post multilevel lumbar  surgery for severe spinal stenosis and herniated disc L2-L3  Hypertension  Hyperlipidemia  History of coronary disease  History of atrial flutter  History of urethral stricture and recurrent urinary infections  Hyperprolactinemia due to pituitary microadenoma  Morbid obesity  History of sleep apnea  Plan: He may need to be referred to wound care center if wound is not healing in the next couple of  weeks. It will be slow to heal and muscle belly and from the inside out. He will return here in 4 weeks. He will start Cymbalta once again 30 mg daily for depression. Will remain on tramadol for pain.

## 2015-12-18 DIAGNOSIS — B965 Pseudomonas (aeruginosa) (mallei) (pseudomallei) as the cause of diseases classified elsewhere: Secondary | ICD-10-CM | POA: Diagnosis not present

## 2015-12-18 DIAGNOSIS — T814XXA Infection following a procedure, initial encounter: Secondary | ICD-10-CM | POA: Diagnosis not present

## 2015-12-18 DIAGNOSIS — B961 Klebsiella pneumoniae [K. pneumoniae] as the cause of diseases classified elsewhere: Secondary | ICD-10-CM | POA: Diagnosis not present

## 2015-12-18 DIAGNOSIS — M5116 Intervertebral disc disorders with radiculopathy, lumbar region: Secondary | ICD-10-CM | POA: Diagnosis not present

## 2015-12-18 DIAGNOSIS — M4800 Spinal stenosis, site unspecified: Secondary | ICD-10-CM | POA: Diagnosis not present

## 2015-12-18 DIAGNOSIS — S82891D Other fracture of right lower leg, subsequent encounter for closed fracture with routine healing: Secondary | ICD-10-CM | POA: Diagnosis not present

## 2015-12-21 DIAGNOSIS — M5116 Intervertebral disc disorders with radiculopathy, lumbar region: Secondary | ICD-10-CM | POA: Diagnosis not present

## 2015-12-21 DIAGNOSIS — S82891D Other fracture of right lower leg, subsequent encounter for closed fracture with routine healing: Secondary | ICD-10-CM | POA: Diagnosis not present

## 2015-12-21 DIAGNOSIS — M4800 Spinal stenosis, site unspecified: Secondary | ICD-10-CM | POA: Diagnosis not present

## 2015-12-21 DIAGNOSIS — B961 Klebsiella pneumoniae [K. pneumoniae] as the cause of diseases classified elsewhere: Secondary | ICD-10-CM | POA: Diagnosis not present

## 2015-12-21 DIAGNOSIS — B965 Pseudomonas (aeruginosa) (mallei) (pseudomallei) as the cause of diseases classified elsewhere: Secondary | ICD-10-CM | POA: Diagnosis not present

## 2015-12-21 DIAGNOSIS — T814XXA Infection following a procedure, initial encounter: Secondary | ICD-10-CM | POA: Diagnosis not present

## 2015-12-22 DIAGNOSIS — M4800 Spinal stenosis, site unspecified: Secondary | ICD-10-CM | POA: Diagnosis not present

## 2015-12-22 DIAGNOSIS — T814XXA Infection following a procedure, initial encounter: Secondary | ICD-10-CM | POA: Diagnosis not present

## 2015-12-22 DIAGNOSIS — M5116 Intervertebral disc disorders with radiculopathy, lumbar region: Secondary | ICD-10-CM | POA: Diagnosis not present

## 2015-12-22 DIAGNOSIS — S82891D Other fracture of right lower leg, subsequent encounter for closed fracture with routine healing: Secondary | ICD-10-CM | POA: Diagnosis not present

## 2015-12-22 DIAGNOSIS — B961 Klebsiella pneumoniae [K. pneumoniae] as the cause of diseases classified elsewhere: Secondary | ICD-10-CM | POA: Diagnosis not present

## 2015-12-22 DIAGNOSIS — B965 Pseudomonas (aeruginosa) (mallei) (pseudomallei) as the cause of diseases classified elsewhere: Secondary | ICD-10-CM | POA: Diagnosis not present

## 2015-12-25 DIAGNOSIS — B965 Pseudomonas (aeruginosa) (mallei) (pseudomallei) as the cause of diseases classified elsewhere: Secondary | ICD-10-CM | POA: Diagnosis not present

## 2015-12-25 DIAGNOSIS — B961 Klebsiella pneumoniae [K. pneumoniae] as the cause of diseases classified elsewhere: Secondary | ICD-10-CM | POA: Diagnosis not present

## 2015-12-25 DIAGNOSIS — S82891D Other fracture of right lower leg, subsequent encounter for closed fracture with routine healing: Secondary | ICD-10-CM | POA: Diagnosis not present

## 2015-12-25 DIAGNOSIS — M4800 Spinal stenosis, site unspecified: Secondary | ICD-10-CM | POA: Diagnosis not present

## 2015-12-25 DIAGNOSIS — T814XXA Infection following a procedure, initial encounter: Secondary | ICD-10-CM | POA: Diagnosis not present

## 2015-12-25 DIAGNOSIS — M5116 Intervertebral disc disorders with radiculopathy, lumbar region: Secondary | ICD-10-CM | POA: Diagnosis not present

## 2015-12-29 DIAGNOSIS — M5116 Intervertebral disc disorders with radiculopathy, lumbar region: Secondary | ICD-10-CM | POA: Diagnosis not present

## 2015-12-29 DIAGNOSIS — S82891D Other fracture of right lower leg, subsequent encounter for closed fracture with routine healing: Secondary | ICD-10-CM | POA: Diagnosis not present

## 2015-12-29 DIAGNOSIS — M4800 Spinal stenosis, site unspecified: Secondary | ICD-10-CM | POA: Diagnosis not present

## 2015-12-29 DIAGNOSIS — B961 Klebsiella pneumoniae [K. pneumoniae] as the cause of diseases classified elsewhere: Secondary | ICD-10-CM | POA: Diagnosis not present

## 2015-12-29 DIAGNOSIS — T814XXA Infection following a procedure, initial encounter: Secondary | ICD-10-CM | POA: Diagnosis not present

## 2015-12-29 DIAGNOSIS — B965 Pseudomonas (aeruginosa) (mallei) (pseudomallei) as the cause of diseases classified elsewhere: Secondary | ICD-10-CM | POA: Diagnosis not present

## 2015-12-30 DIAGNOSIS — B961 Klebsiella pneumoniae [K. pneumoniae] as the cause of diseases classified elsewhere: Secondary | ICD-10-CM | POA: Diagnosis not present

## 2015-12-30 DIAGNOSIS — M5116 Intervertebral disc disorders with radiculopathy, lumbar region: Secondary | ICD-10-CM | POA: Diagnosis not present

## 2015-12-30 DIAGNOSIS — M4800 Spinal stenosis, site unspecified: Secondary | ICD-10-CM | POA: Diagnosis not present

## 2015-12-30 DIAGNOSIS — B965 Pseudomonas (aeruginosa) (mallei) (pseudomallei) as the cause of diseases classified elsewhere: Secondary | ICD-10-CM | POA: Diagnosis not present

## 2015-12-30 DIAGNOSIS — S82891D Other fracture of right lower leg, subsequent encounter for closed fracture with routine healing: Secondary | ICD-10-CM | POA: Diagnosis not present

## 2015-12-30 DIAGNOSIS — T814XXA Infection following a procedure, initial encounter: Secondary | ICD-10-CM | POA: Diagnosis not present

## 2015-12-31 ENCOUNTER — Encounter: Payer: Medicare Other | Attending: Internal Medicine | Admitting: Internal Medicine

## 2015-12-31 DIAGNOSIS — I10 Essential (primary) hypertension: Secondary | ICD-10-CM | POA: Diagnosis not present

## 2015-12-31 DIAGNOSIS — M1990 Unspecified osteoarthritis, unspecified site: Secondary | ICD-10-CM | POA: Diagnosis not present

## 2015-12-31 DIAGNOSIS — L98429 Non-pressure chronic ulcer of back with unspecified severity: Secondary | ICD-10-CM | POA: Diagnosis not present

## 2015-12-31 DIAGNOSIS — T8131XD Disruption of external operation (surgical) wound, not elsewhere classified, subsequent encounter: Secondary | ICD-10-CM | POA: Diagnosis not present

## 2015-12-31 DIAGNOSIS — J45909 Unspecified asthma, uncomplicated: Secondary | ICD-10-CM | POA: Insufficient documentation

## 2015-12-31 DIAGNOSIS — T8131XA Disruption of external operation (surgical) wound, not elsewhere classified, initial encounter: Secondary | ICD-10-CM | POA: Diagnosis not present

## 2015-12-31 DIAGNOSIS — Y839 Surgical procedure, unspecified as the cause of abnormal reaction of the patient, or of later complication, without mention of misadventure at the time of the procedure: Secondary | ICD-10-CM | POA: Diagnosis not present

## 2015-12-31 DIAGNOSIS — M4806 Spinal stenosis, lumbar region: Secondary | ICD-10-CM | POA: Insufficient documentation

## 2016-01-01 ENCOUNTER — Other Ambulatory Visit
Admission: RE | Admit: 2016-01-01 | Discharge: 2016-01-01 | Disposition: A | Discharge status: Home / Self Care | Payer: Medicare Other | Source: Ambulatory Visit | Attending: Internal Medicine | Admitting: Internal Medicine

## 2016-01-01 DIAGNOSIS — L089 Local infection of the skin and subcutaneous tissue, unspecified: Secondary | ICD-10-CM | POA: Diagnosis not present

## 2016-01-01 NOTE — Progress Notes (Signed)
LAQUENTIN, PREGLER ([id number]) Visit Report for 12/31/2015 Chief Complaint Document Details Patient Name: Mitchell [name], Mitchell [name] Date of Service: 12/31/2015 8:00 AM Medical Record Patient Account Number: [id number] Number: Treating RN: Montey Hora 04/12/1935 (81 y.o. Other Clinician: Date of Birth/Sex: Male) Treating Hugo Lybrand Primary Care Physician: Tedra Senegal Physician/Extender: G Referring Physician: Maryan Char in Treatment: 0 Information Obtained from: Patient Chief Complaint 12/31/15; 81 year old man admitted to clinic for review of his surgical wound in his lumbar spine area Electronic Signature(s) Signed: 01/01/2016 7:42:49 AM By: Linton Ham MD Entered By: Linton Ham on 12/31/2015 20:47:08 Mitchell [name] ([id number]) -------------------------------------------------------------------------------- HPI Details Patient Name: Mitchell [name] Date of Service: 12/31/2015 8:00 AM Medical Record Patient Account Number: [id number] Number: Treating RN: Montey Hora 06/10/1934 (81 y.o. Other Clinician: Date of Birth/Sex: Male) Treating Mionna Advincula Primary Care Physician: Tedra Senegal Physician/Extender: G Referring Physician: Maryan Char in Treatment: 0 History of Present Illness HPI Description: 12/31/15; this is a patient to was increasingly incapacitated over the last year with severe lumbar spinal stenosis and radiculopathy at L4-L5 L5-S1 with a large disc herniation at L2-L3. He had several spinal injections over the last year with no relief in his pain. On July 7 17 he had a redo decompression with fusion at L4-L5 L5-S1 and a laminectomy and microdiscectomy of L2-L3 with pedicle screw fixation L2-S1. Is not really clear to me at the time of this dictation as to the exact course of this wound. However he was discharged to rehabilitation at [name] and it was very clear at the end of the stay here that there was an  open area for which she had a wound VAC for a period of time. A culture on 10/29/15 showed both Pseudomonas and Klebsiella. He was discharged on ciprofloxacin and he still remains on that currently. He has home health going into his home now and doing iodoform packing. He has a follow-up with his neurosurgeon Dr. Vertell Limber tomorrow. There is still moderate amount of drainage. However the patient denies fever or chills or excessive pain. He is working hard with physical therapy in order to gain ambulatory status. He has a right foot drop for which he uses an AFO brace. As far as I can tell he has not had any advanced imaging of the low back although he apparently a has had plain x-rays in the neurosurgeon's office. No recent cultures Electronic Signature(s) Signed: 01/01/2016 7:42:49 AM By: Linton Ham MD Entered By: Linton Ham on 12/31/2015 20:55:20 Mitchell [name] ([id number]) -------------------------------------------------------------------------------- Physical Exam Details Patient Name: Mitchell [name] Date of Service: 12/31/2015 8:00 AM Medical Record Patient Account Number: [id number] Number: Treating RN: Montey Hora 1934-05-05 (81 y.o. Other Clinician: Date of Birth/Sex: Male) Treating Sharmin Foulk Primary Care Physician: Tedra Senegal Physician/Extender: G Referring Physician: Maryan Char in Treatment: 0 Constitutional Patient is hypertensive.. Pulse regular and within target range for patient.[name] Respirations regular, non-labored and within target range.. Temperature is normal and within the target range for the patient.. The patient appears well. Eyes Conjunctivae clear. No discharge.[name] Respiratory Respiratory effort is easy and symmetric bilaterally. Rate is normal at rest and on room air.. Bilateral breath sounds are clear and equal in all lobes with no wheezes, rales or rhonchi.. Cardiovascular Heart rhythm and rate regular, without murmur or  gallop.[name] Neurological Knee jerks are present. He has 4 out of 5 hip abductor weakness bilaterally. Psychiatric No evidence of depression, anxiety, or agitation. Calm, cooperative, and communicative.  Appropriate interactions and affect.. Notes Wound exam; the patient has a very small orifice to his wound which is roughly at the L3 level. However this probes superiorly at 7.6 cm. Serous liquid drainage noted. There is no palpable soft tissue tenderness. Reviewing the home health notes shows that there measurements about 2 weeks ago were 7.8 cm therefore certainly not closing rapidly. Electronic Signature(s) Signed: 01/01/2016 7:42:49 AM By: Linton Ham MD Entered By: Linton Ham on 12/31/2015 20:59:47 Mitchell [name] ([id number]) -------------------------------------------------------------------------------- Physician Orders Details Patient Name: Mitchell [name] Date of Service: 12/31/2015 8:00 AM Medical Record Patient Account Number: [id number] Number: Treating RN: Montey Hora 02-11-1935 (81 y.o. Other Clinician: Date of Birth/Sex: Male) Treating Lyndal Reggio Primary Care Physician: Tedra Senegal Physician/Extender: G Referring Physician: Maryan Char in Treatment: 0 Verbal / Phone Orders: Yes Clinician: Montey Hora Read Back and Verified: Yes Diagnosis Coding Wound Cleansing Wound #1 Midline Back o Clean wound with Normal Saline. o May Shower, gently pat wound dry prior to applying new dressing. Anesthetic Wound #1 Midline Back o Topical Lidocaine 4% cream applied to wound bed prior to debridement Primary Wound Dressing Wound #1 Midline Back o Aquacel Ag - aquacel ag rope - HHRN to provide this to patient Secondary Dressing Wound #1 Midline Back o ABD pad - secure with tape, may use xtrasorb if needed for drainage Dressing Change Frequency Wound #1 Midline Back o Change dressing every day. Follow-up Appointments Wound #1  Midline Back o Return Appointment in 1 week. Home Health Wound #1 Midline Back o [place] Visits - [place] Nurse may visit PRN to address patientos wound care needs. o FACE TO FACE ENCOUNTER: MEDICARE and MEDICAID PATIENTS: I certify that this patient is under my care and that I had a face-to-face encounter that meets the physician face-to-face encounter requirements with this patient on this date. The encounter with the patient was in whole or in part for the following MEDICAL CONDITION: (primary reason for [place]) Mitchell [name], Mitchell [name] ([id number]) MEDICAL NECESSITY: I certify, that based on my findings, NURSING services are a medically necessary home health service. HOME BOUND STATUS: I certify that my clinical findings support that this patient is homebound (i.e., Due to illness or injury, pt requires aid of supportive devices such as crutches, cane, wheelchairs, walkers, the use of special transportation or the assistance of another person to leave their place of residence. There is a normal inability to leave the home and doing so requires considerable and taxing effort. Other absences are for medical reasons / religious services and are infrequent or of short duration when for other reasons). o If current dressing causes regression in wound condition, may D/C ordered dressing product/s and apply Normal Saline Moist Dressing daily until next [place] / Other MD appointment. [place] of regression in wound condition at [phone]. o Please direct any NON-WOUND related issues/requests for orders to patient's Primary Care Physician Laboratory o Bacteria identified in Wound by Culture (MICRO) oooo LOINC Code: [id number] Convenience Name: Wound culture routine Electronic Signature(s) Signed: 12/31/2015 5:51:34 PM By: Montey Hora Signed: 01/01/2016 7:42:49 AM By: Linton Ham MD Entered By: Montey Hora  on 12/31/2015 17:32:54 Mitchell [name], Mitchell [name] ([id number]) -------------------------------------------------------------------------------- Problem List Details Patient Name: Mitchell [name] Date of Service: 12/31/2015 8:00 AM Medical Record Patient Account Number: [id number] Number: Treating RN: Montey Hora [birth year] (81 y.o. Other Clinician: Date of Birth/Sex: Male) Treating Kehinde Totzke, [place] Primary Care Physician: Renold Genta,  MARY Physician/Extender: G Referring Physician: Maryan Char in Treatment: 0 Active Problems ICD-10 Encounter Code Description Active Date Diagnosis T81.31XD Disruption of external operation (surgical) wound, not 12/31/2015 Yes elsewhere classified, subsequent encounter M48.06 Spinal stenosis, lumbar region 12/31/2015 Yes L98.429 Non-pressure chronic ulcer of back with unspecified 12/31/2015 Yes severity Inactive Problems Resolved Problems Electronic Signature(s) Signed: 01/01/2016 7:42:49 AM By: Linton Ham MD Entered By: Linton Ham on 12/31/2015 20:46:13 Mitchell [name] ([id number]) -------------------------------------------------------------------------------- Progress Note Details Patient Name: Mitchell [name] Date of Service: 12/31/2015 8:00 AM Medical Record Patient Account Number: [id number] Number: Treating RN: Montey Hora 1934-11-30 (81 y.o. Other Clinician: Date of Birth/Sex: Male) Treating Truth Barot Primary Care Physician: Tedra Senegal Physician/Extender: G Referring Physician: Maryan Char in Treatment: 0 Subjective Chief Complaint Information obtained from Patient 12/31/15; 81 year old man admitted to clinic for review of his surgical wound in his lumbar spine area History of Present Illness (HPI) 12/31/15; this is a patient to was increasingly incapacitated over the last year with severe lumbar spinal stenosis and radiculopathy at L4-L5 L5-S1 with a large disc herniation at L2-L3. He  had several spinal injections over the last year with no relief in his pain. On July 7 17 he had a redo decompression with fusion at L4-L5 L5-S1 and a laminectomy and microdiscectomy of L2-L3 with pedicle screw fixation L2-S1. Is not really clear to me at the time of this dictation as to the exact course of this wound. However he was discharged to rehabilitation at [facility] and it was very clear at the end of the stay here that there was an open area for which she had a wound VAC for a period of time. A culture on 10/29/15 showed both Pseudomonas and Klebsiella. He was discharged on ciprofloxacin and he still remains on that currently. He has home health going into his home now and doing iodoform packing. He has a follow-up with his neurosurgeon Dr. Vertell Limber tomorrow. There is still moderate amount of drainage. However the patient denies fever or chills or excessive pain. He is working hard with physical therapy in order to gain ambulatory status. He has a right foot drop for which he uses an AFO brace. As far as I can tell he has not had any advanced imaging of the low back although he apparently a has had plain x-rays in the neurosurgeon's office. No recent cultures Wound History Patient presents with 1 open wound that has been present for approximately July 7. Patient has been treating wound in the following manner: packing strip. Laboratory tests have not been performed in the last month. Patient reportedly has not tested positive for an antibiotic resistant organism. Patient reportedly has not tested positive for osteomyelitis. Patient reportedly has not had testing performed to evaluate circulation in the legs. Patient History Information obtained from Patient. Allergies codeine, Statins-Hmg-Coa Reductase Inhibitors, 5-Alfa Reductase Inhibitor, Azasteroids Social History Mitchell [name], Mitchell [name]. ([id number]) Never smoker, Marital Status - Married, Alcohol Use - Never, Drug Use - No History,  Caffeine Use - Never. Medical History Eyes Patient has history of Cataracts Respiratory Patient has history of Asthma Cardiovascular Patient has history of Arrhythmia, Hypertension Musculoskeletal Patient has history of Osteoarthritis Oncologic Denies history of Received Chemotherapy, Received Radiation Psychiatric Denies history of Anorexia/bulimia, Confinement Anxiety Hospitalization/Surgery History - 10/24/2015, [place], back surgery. Medical And Surgical History Notes Cardiovascular stent Genitourinary urethral strictures Review of Systems (ROS) Constitutional Symptoms (General Health) The patient has no complaints or symptoms. Eyes Complains or has symptoms of  Glasses / Contacts - glasses. Ear/Nose/Mouth/Throat The patient has no complaints or symptoms. Hematologic/Lymphatic The patient has no complaints or symptoms. Respiratory The patient has no complaints or symptoms. Cardiovascular The patient has no complaints or symptoms. Gastrointestinal The patient has no complaints or symptoms. Endocrine The patient has no complaints or symptoms. Genitourinary The patient has no complaints or symptoms. Immunological The patient has no complaints or symptoms. Integumentary (Skin) The patient has no complaints or symptoms. Musculoskeletal The patient has no complaints or symptoms. Neurologic The patient has no complaints or symptoms. Mitchell [name], Mitchell [name] ([id number]) Oncologic The patient has no complaints or symptoms. Psychiatric The patient has no complaints or symptoms. Objective Constitutional Patient is hypertensive.. Pulse regular and within target range for patient.[name] Respirations regular, non-labored and within target range.. Temperature is normal and within the target range for the patient.. The patient appears well. Vitals Time Taken: 8:12 AM, Height: 75 in, Source: Stated, Weight: 272 lbs, Source: Stated, BMI: 34, Temperature: 97.7 F, Pulse: 115 bpm,  Respiratory Rate: 18 breaths/min, Blood Pressure: 142/99 mmHg. Eyes Conjunctivae clear. No discharge.[name] Respiratory Respiratory effort is easy and symmetric bilaterally. Rate is normal at rest and on room air.. Bilateral breath sounds are clear and equal in all lobes with no wheezes, rales or rhonchi.. Cardiovascular Heart rhythm and rate regular, without murmur or gallop.[name] Neurological Knee jerks are present. He has 4 out of 5 hip abductor weakness bilaterally. Psychiatric No evidence of depression, anxiety, or agitation. Calm, cooperative, and communicative. Appropriate interactions and affect.. General Notes: Wound exam; the patient has a very small orifice to his wound which is roughly at the L3 level. However this probes superiorly at 7.6 cm. Serous liquid drainage noted. There is no palpable soft tissue tenderness. Reviewing the home health notes shows that there measurements about 2 weeks ago were 7.8 cm therefore certainly not closing rapidly. Integumentary (Hair, Skin) Wound #1 status is Open. Original cause of wound was Surgical Injury. The wound is located on the Midline Back. The wound measures 1.1cm length x 0.4cm width x 7.6cm depth; 0.346cm^2 area and 2.626cm^3 volume. The wound is limited to skin breakdown. There is no tunneling or undermining noted. There is a large amount of purulent drainage noted. The wound margin is flat and intact. There is large (67-100%) red granulation within the wound bed. There is no necrotic tissue within the wound bed. The periwound skin Mitchell [name], [name] ([id number]) appearance exhibited: Moist. The periwound skin appearance did not exhibit: Callus, Crepitus, Excoriation, Fluctuance, Friable, Induration, Localized Edema, Rash, Scarring, Dry/Scaly, Maceration, Atrophie Blanche, Cyanosis, Ecchymosis, Hemosiderin Staining, Mottled, Pallor, Rubor, Erythema. Periwound temperature was noted as No Abnormality. Assessment Active  Problems ICD-10 T81.31XD - Disruption of external operation (surgical) wound, not elsewhere classified, subsequent encounter M48.06 - Spinal stenosis, lumbar region L98.429 - Non-pressure chronic ulcer of back with unspecified severity Plan Wound Cleansing: Wound #1 Midline Back: Clean wound with Normal Saline. May Shower, gently pat wound dry prior to applying new dressing. Anesthetic: Wound #1 Midline Back: Topical Lidocaine 4% cream applied to wound bed prior to debridement Primary Wound Dressing: Wound #1 Midline Back: Aquacel Ag - aquacel ag rope - HHRN to provide this to patient Secondary Dressing: Wound #1 Midline Back: ABD pad - secure with tape, may use xtrasorb if needed for drainage Dressing Change Frequency: Wound #1 Midline Back: Change dressing every day. Follow-up Appointments: Wound #1 Midline Back: Return Appointment in 1 week. Home Health: Wound #1 Midline Back: [place] Visits - [place]  Home Health Nurse may visit PRN to address patient s wound care needs. FACE TO FACE ENCOUNTER: MEDICARE and MEDICAID PATIENTS: I certify that this patient is under my care and that I had a face-to-face encounter that meets the physician face-to-face encounter Mitchell [name], Mitchell [name]. ([id number]) requirements with this patient on this date. The encounter with the patient was in whole or in part for the following MEDICAL CONDITION: (primary reason for [place]) MEDICAL NECESSITY: I certify, that based on my findings, NURSING services are a medically necessary home health service. HOME BOUND STATUS: I certify that my clinical findings support that this patient is homebound (i.e., Due to illness or injury, pt requires aid of supportive devices such as crutches, cane, wheelchairs, walkers, the use of special transportation or the assistance of another person to leave their place of residence. There is a normal inability to leave the home and doing so requires  considerable and taxing effort. Other absences are for medical reasons / religious services and are infrequent or of short duration when for other reasons). If current dressing causes regression in wound condition, may D/C ordered dressing product/s and apply Normal Saline Moist Dressing daily until next [place] / Other MD appointment. [place] of regression in wound condition at [phone]. Please direct any NON-WOUND related issues/requests for orders to patient's Primary Care Physician Laboratory ordered were: Wound culture routine #1 I have done a deep culture of the base of this wound. As mentioned he is still on Cipro #2 I changed the packing to silver alginate rope. #3 his daughter who is present along with his wife asked about CT scanning and I asked them to bring this up with Dr. Vertell Limber tomorrow. I think it is probably indicated at this point Electronic Signature(s) Signed: 01/01/2016 7:42:49 AM By: Linton Ham MD Entered By: Linton Ham on 12/31/2015 21:01:18 Mitchell [name], Mitchell [name] ([id number]) -------------------------------------------------------------------------------- ROS/PFSH Details Patient Name: Mitchell [name] Date of Service: 12/31/2015 8:00 AM Medical Record Patient Account Number: [id number] Number: Treating RN: Montey Hora 09/30/1934 (81 y.o. Other Clinician: Date of Birth/Sex: Male) Treating Orien Mayhall, [place] Primary Care Physician: Tedra Senegal Physician/Extender: G Referring Physician: Maryan Char in Treatment: 0 Information Obtained From Patient Wound History Do you currently have one or more open woundso Yes How many open wounds do you currently haveo 1 Approximately how long have you had your woundso July 7 How have you been treating your wound(s) until nowo packing strip Has your wound(s) ever healed and then re-openedo No Have you had any lab work done in the past montho No Have you tested  positive for an antibiotic resistant organism (MRSA, VRE)o No Have you tested positive for osteomyelitis (bone infection)o No Have you had any tests for circulation on your legso No Eyes Complaints and Symptoms: Positive for: Glasses / Contacts - glasses Medical History: Positive for: Cataracts Psychiatric Complaints and Symptoms: No Complaints or Symptoms Complaints and Symptoms: Negative for: Anxiety; Claustrophobia Medical History: Negative for: Anorexia/bulimia; Confinement Anxiety Constitutional Symptoms (General Health) Complaints and Symptoms: No Complaints or Symptoms Ear/Nose/Mouth/Throat Mitchell [name], [place]. ([id number]) Complaints and Symptoms: No Complaints or Symptoms Hematologic/Lymphatic Complaints and Symptoms: No Complaints or Symptoms Respiratory Complaints and Symptoms: No Complaints or Symptoms Medical History: Positive for: Asthma Cardiovascular Complaints and Symptoms: No Complaints or Symptoms Medical History: Positive for: Arrhythmia; Hypertension Past Medical History Notes: stent Gastrointestinal Complaints and Symptoms: No Complaints or Symptoms Endocrine Complaints and Symptoms: No Complaints or Symptoms Genitourinary Complaints and Symptoms: No  Complaints or Symptoms Medical History: Past Medical History Notes: urethral strictures Immunological Complaints and Symptoms: No Complaints or Symptoms Integumentary (Skin) Mitchell [name], [name] ([id number]) Complaints and Symptoms: No Complaints or Symptoms Musculoskeletal Complaints and Symptoms: No Complaints or Symptoms Medical History: Positive for: Osteoarthritis Neurologic Complaints and Symptoms: No Complaints or Symptoms Oncologic Complaints and Symptoms: No Complaints or Symptoms Medical History: Negative for: Received Chemotherapy; Received Radiation HBO Extended History Items Eyes: Cataracts Immunizations Pneumococcal Vaccine: Received Pneumococcal Vaccination:  Yes Immunization Notes: up to date Hospitalization / Surgery History Name of Hospital Purpose of Hospitalization/Surgery Date [facility] back surgery 10/24/2015 Family and Social History Never smoker; Marital Status - Married; Alcohol Use: Never; Drug Use: No History; Caffeine Use: Never; Financial Concerns: No; Food, Clothing or Shelter Needs: No; Support System Lacking: No; Transportation Concerns: No; Advanced Directives: No; Patient does not want information on Advanced Directives Electronic Signature(s) Signed: 12/31/2015 5:51:34 PM By: Montey Hora Signed: 01/01/2016 7:42:49 AM By: Linton Ham MD Entered By: Montey Hora on 12/31/2015 08:25:16 Mitchell [name], Mitchell [name] ([id number]) -------------------------------------------------------------------------------- SuperBill Details Patient Name: Mitchell [name] Date of Service: 12/31/2015 Medical Record Patient Account Number: [id number] Number: Treating RN: Montey Hora [birth year] (81 y.o. Other Clinician: Date of Birth/Sex: Male) Treating Regnald Bowens, [place] Primary Care Physician: Tedra Senegal Physician/Extender: G Referring Physician: Maryan Char in Treatment: 0 Diagnosis Coding ICD-10 Codes Code Description Disruption of external operation (surgical) wound, not elsewhere classified, subsequent T81.31XD encounter M48.06 Spinal stenosis, lumbar region L98.429 Non-pressure chronic ulcer of back with unspecified severity Facility Procedures CPT4 Code: [id number] Description: 99214 - WOUND CARE VISIT-LEV 4 EST PT Modifier: Quantity: 1 Physician Procedures CPT4: Description Modifier Quantity Code [id number] WC PHYS LEVEL 3 o NEW PT 1 ICD-10 Description Diagnosis T81.31XD Disruption of external operation (surgical) wound, not elsewhere classified, subsequent encounter Electronic Signature(s) Signed: 01/01/2016 7:42:49 AM By: Linton Ham MD Entered By: Linton Ham on 12/31/2015 21:01:56

## 2016-01-01 NOTE — Progress Notes (Addendum)
Mitchell [name], Mitchell [name] ([id number]) Visit Report for 12/31/2015 Allergy List Details Patient Name: Mitchell [name], Mitchell [name] Date of Service: 12/31/2015 8:00 AM Medical Record Number: [id number] Patient Account Number: [id number] Date of Birth/Sex: 06-25-1934 (81 y.o. Male) Treating RN: Montey Hora Primary Care Physician: Tedra Senegal Other Clinician: Referring Physician: Erline Levine Treating Physician/Extender: Mitchell [name] Weeks in Treatment: 0 Allergies Active Allergies codeine Statins-Hmg-Coa Reductase Inhibitors 5-Alfa Reductase Inhibitor, Azasteroids Allergy Notes Electronic Signature(s) Signed: 12/31/2015 5:51:34 PM By: Montey Hora Entered By: Montey Hora on 12/31/2015 08:16:55 Mitchell [name] ([id number]) -------------------------------------------------------------------------------- Arrival Information Details Patient Name: Mitchell [name] Date of Service: 12/31/2015 8:00 AM Medical Record Number: [id number] Patient Account Number: [id number] Date of Birth/Sex: Aug 13, 1934 (81 y.o. Male) Treating RN: Montey Hora Primary Care Physician: Tedra Senegal Other Clinician: Referring Physician: Erline Levine Treating Physician/Extender: Mitchell [name] in Treatment: 0 Visit Information Patient Arrived: Wheel Chair Arrival Time: 08:11 Accompanied By: spouse Transfer Assistance: Manual Patient Identification Verified: Yes Secondary Verification Process Yes Completed: Electronic Signature(s) Signed: 12/31/2015 5:51:34 PM By: Montey Hora Entered By: Montey Hora on 12/31/2015 08:12:09 Mitchell [name] ([id number]) -------------------------------------------------------------------------------- Clinic Level of Care Assessment Details Patient Name: Mitchell [name] Date of Service: 12/31/2015 8:00 AM Medical Record Number: [id number] Patient Account Number: [id number] Date of Birth/Sex: 1934-10-09 (81 y.o. Male) Treating RN: Montey Hora Primary Care  Physician: Tedra Senegal Other Clinician: Referring Physician: Erline Levine Treating Physician/Extender: Mitchell [name] in Treatment: 0 Clinic Level of Care Assessment Items TOOL 2 Quantity Score []  - Use when only an EandM is performed on the INITIAL visit 0 ASSESSMENTS - Nursing Assessment / Reassessment X - General Physical Exam (combine w/ comprehensive assessment (listed just 1 20 below) when performed on new pt. evals) X - Comprehensive Assessment (HX, ROS, Risk Assessments, Wounds Hx, etc.) 1 25 ASSESSMENTS - Wound and Skin Assessment / Reassessment X - Simple Wound Assessment / Reassessment - one wound 1 5 []  - Complex Wound Assessment / Reassessment - multiple wounds 0 []  - Dermatologic / Skin Assessment (not related to wound area) 0 ASSESSMENTS - Ostomy and/or Continence Assessment and Care []  - Incontinence Assessment and Management 0 []  - Ostomy Care Assessment and Management (repouching, etc.) 0 PROCESS - Coordination of Care X - Simple Patient / Family Education for ongoing care 1 15 []  - Complex (extensive) Patient / Family Education for ongoing care 0 X - Staff obtains Programmer, systems, Records, Test Results / Process Orders 1 10 []  - Staff telephones HHA, Nursing Homes / Clarify orders / etc 0 []  - Routine Transfer to another Facility (non-emergent condition) 0 []  - Routine Hospital Admission (non-emergent condition) 0 X - New Admissions / Biomedical engineer / Ordering NPWT, Apligraf, etc. 1 15 []  - Emergency Hospital Admission (emergent condition) 0 X - Simple Discharge Coordination 1 10 Mitchell [name], [name] ([id number]) []  - Complex (extensive) Discharge Coordination 0 PROCESS - Special Needs []  - Pediatric / Minor Patient Management 0 []  - Isolation Patient Management 0 []  - Hearing / Language / Visual special needs 0 []  - Assessment of Community assistance (transportation, D/C planning, etc.) 0 []  - Additional assistance / Altered mentation 0 []  - Support  Surface(s) Assessment (bed, cushion, seat, etc.) 0 INTERVENTIONS - Wound Cleansing / Measurement X - Wound Imaging (photographs - any number of wounds) 1 5 []  - Wound Tracing (instead of photographs) 0 X - Simple Wound Measurement - one wound 1 5 []  - Complex Wound Measurement - multiple wounds 0 X - Simple  Wound Cleansing - one wound 1 5 []  - Complex Wound Cleansing - multiple wounds 0 INTERVENTIONS - Wound Dressings X - Small Wound Dressing one or multiple wounds 1 10 []  - Medium Wound Dressing one or multiple wounds 0 []  - Large Wound Dressing one or multiple wounds 0 []  - Application of Medications - injection 0 INTERVENTIONS - Miscellaneous []  - External ear exam 0 X - Specimen Collection (cultures, biopsies, blood, body fluids, etc.) 1 5 []  - Specimen(s) / Culture(s) sent or taken to Lab for analysis 0 []  - Patient Transfer (multiple staff / Harrel Lemon Lift / Similar devices) 0 []  - Simple Staple / Suture removal (25 or less) 0 []  - Complex Staple / Suture removal (26 or more) 0 Mitchell [name], [name] ([id number]) []  - Hypo / Hyperglycemic Management (close monitor of Blood Glucose) 0 []  - Ankle / Brachial Index (ABI) - do not check if billed separately 0 Has the patient been seen at the hospital within the last three years: Yes Total Score: 130 Level Of Care: New/Established - Level 4 Electronic Signature(s) Signed: 12/31/2015 5:51:34 PM By: Montey Hora Entered By: Montey Hora on 12/31/2015 09:00:57 Mitchell [name] ([id number]) -------------------------------------------------------------------------------- Encounter Discharge Information Details Patient Name: Mitchell [name] Date of Service: 12/31/2015 8:00 AM Medical Record Number: [id number] Patient Account Number: [id number] Date of Birth/Sex: June 04, 1934 (81 y.o. Male) Treating RN: Montey Hora Primary Care Physician: Tedra Senegal Other Clinician: Referring Physician: Erline Levine Treating Physician/Extender: Mitchell [name] in Treatment: 0 Encounter Discharge Information Items Discharge Pain Level: 0 Discharge Condition: Stable Ambulatory Status: Wheelchair Discharge Destination: Home Transportation: Private Auto Accompanied By: spouse Schedule Follow-up Appointment: Yes Medication Reconciliation completed and provided to Patient/Care No Carlyn Lemke: Provided on Clinical Summary of Care: 12/31/2015 Form Type Recipient Paper Patient HD Electronic Signature(s) Signed: 12/31/2015 9:28:23 AM By: Ruthine Dose Entered By: Ruthine Dose on 12/31/2015 09:28:23 Mitchell [name] ([id number]) -------------------------------------------------------------------------------- Multi Wound Chart Details Patient Name: Mitchell [name] Date of Service: 12/31/2015 8:00 AM Medical Record Number: [id number] Patient Account Number: [id number] Date of Birth/Sex: 09-Apr-1935 (81 y.o. Male) Treating RN: Montey Hora Primary Care Physician: Tedra Senegal Other Clinician: Referring Physician: Erline Levine Treating Physician/Extender: Mitchell [name] Weeks in Treatment: 0 Vital Signs Height(in): 75 Pulse(bpm): 115 Weight(lbs): 272 Blood Pressure 142/99 (mmHg): Body Mass Index(BMI): 34 Temperature(F): 97.7 Respiratory Rate 18 (breaths/min): Photos: [N/A:N/A] Wound Location: Back - Midline N/A N/A Wounding Event: Surgical Injury N/A N/A Primary Etiology: Open Surgical Wound N/A N/A Comorbid History: Cataracts, Asthma, N/A N/A Arrhythmia, Hypertension, Osteoarthritis Date Acquired: 10/24/2015 N/A N/A Weeks of Treatment: 0 N/A N/A Wound Status: Open N/A N/A Measurements L x W x D 1.1x0.4x7.6 N/A N/A (cm) Area (cm) : 0.346 N/A N/A Volume (cm) : 2.626 N/A N/A Classification: Full Thickness Without N/A N/A Exposed Support Structures Exudate Amount: Large N/A N/A Exudate Type: Purulent N/A N/A Exudate Color: yellow, brown, green N/A N/A Wound Margin: Flat and Intact N/A N/A Granulation  Amount: Large (67-100%) N/A N/A Granulation Quality: Red N/A N/A Necrotic Amount: None Present (0%) N/A N/A Mitchell [name], [name] ([id number]) Exposed Structures: Fascia: No N/A N/A Fat: No Tendon: No Muscle: No Joint: No Bone: No Limited to Skin Breakdown Epithelialization: None N/A N/A Periwound Skin Texture: Edema: No N/A N/A Excoriation: No Induration: No Callus: No Crepitus: No Fluctuance: No Friable: No Rash: No Scarring: No Periwound Skin Moist: Yes N/A N/A Moisture: Maceration: No Dry/Scaly: No Periwound Skin Color: Atrophie Blanche: No N/A N/A Cyanosis: No Ecchymosis: No  Erythema: No Hemosiderin Staining: No Mottled: No Pallor: No Rubor: No Temperature: No Abnormality N/A N/A Tenderness on No N/A N/A Palpation: Wound Preparation: Ulcer Cleansing: N/A N/A Rinsed/Irrigated with Saline Topical Anesthetic Applied: None Treatment Notes Electronic Signature(s) Signed: 12/31/2015 5:51:34 PM By: Montey Hora Entered By: Montey Hora on 12/31/2015 08:49:39 Mitchell [name], Mitchell [name] ([id number]) -------------------------------------------------------------------------------- [place] Details Patient Name: Mitchell [name] Date of Service: 12/31/2015 8:00 AM Medical Record Number: [id number] Patient Account Number: [id number] Date of Birth/Sex: [birth year] (81 y.o. Male) Treating RN: Montey Hora Primary Care Physician: Tedra Senegal Other Clinician: Referring Physician: Erline Levine Treating Physician/Extender: Mitchell [name] in Treatment: 0 Active Inactive Abuse / Safety / Falls / Self Care Management Nursing Diagnoses: Impaired physical mobility Potential for falls Goals: Patient will remain injury free Date Initiated: 12/31/2015 Goal Status: Active Interventions: Assess fall risk on admission and as needed Notes: Orientation to the Wound Care Program Nursing Diagnoses: Knowledge deficit related to the wound healing center  program Goals: Patient/caregiver will verbalize understanding of the [facility] Date Initiated: 12/31/2015 Goal Status: Active Interventions: Provide education on orientation to the wound center Notes: Wound/Skin Impairment Nursing Diagnoses: Impaired tissue integrity Goals: Patient/caregiver will verbalize understanding of skin care regimen Mitchell [name], Mitchell [name] ([id number]) Date Initiated: 12/31/2015 Goal Status: Active Ulcer/skin breakdown will have a volume reduction of 30% by week 4 Date Initiated: 12/31/2015 Goal Status: Active Ulcer/skin breakdown will have a volume reduction of 50% by week 8 Date Initiated: 12/31/2015 Goal Status: Active Ulcer/skin breakdown will have a volume reduction of 80% by week 12 Date Initiated: 12/31/2015 Goal Status: Active Ulcer/skin breakdown will heal within 14 weeks Date Initiated: 12/31/2015 Goal Status: Active Interventions: Assess patient/caregiver ability to obtain necessary supplies Assess patient/caregiver ability to perform ulcer/skin care regimen upon admission and as needed Assess ulceration(s) every visit Notes: Electronic Signature(s) Signed: 12/31/2015 5:51:34 PM By: Montey Hora Entered By: Montey Hora on 12/31/2015 08:49:22 Mitchell [name] ([id number]) -------------------------------------------------------------------------------- Pain Assessment Details Patient Name: Mitchell [name] Date of Service: 12/31/2015 8:00 AM Medical Record Number: [id number] Patient Account Number: [id number] Date of Birth/Sex: 10/22/1934 (81 y.o. Male) Treating RN: Montey Hora Primary Care Physician: Tedra Senegal Other Clinician: Referring Physician: Erline Levine Treating Physician/Extender: Mitchell [name] Weeks in Treatment: 0 Active Problems Location of Pain Severity and Description of Pain Patient Has Paino No Site Locations Pain Management and Medication Current Pain Management: Notes Topical or injectable  lidocaine is offered to patient for acute pain when surgical debridement is performed. If needed, Patient is instructed to use over the counter pain medication for the following 24-48 hours after debridement. Wound care MDs do not prescribed pain medications. Patient has chronic pain or uncontrolled pain. Patient has been instructed to make an appointment with their Primary Care Physician for pain management. Electronic Signature(s) Signed: 12/31/2015 5:51:34 PM By: Montey Hora Entered By: Montey Hora on 12/31/2015 08:12:20 Mitchell [name] ([id number]) -------------------------------------------------------------------------------- Patient/Caregiver Education Details Patient Name: Mitchell [name] Date of Service: 12/31/2015 8:00 AM Medical Record Number: [id number] Patient Account Number: [id number] Date of Birth/Gender: Apr 10, 1935 (81 y.o. Male) Treating RN: Montey Hora Primary Care Physician: Tedra Senegal Other Clinician: Referring Physician: Erline Levine Treating Physician/Extender: Mitchell [name] in Treatment: 0 Education Assessment Education Provided To: Patient and Caregiver Education Topics Provided Wound/Skin Impairment: Handouts: Other: wound care as ordered Methods: Demonstration, Explain/Verbal Responses: State content correctly Electronic Signature(s) Signed: 12/31/2015 5:51:34 PM By: Montey Hora Entered By: Montey Hora on 12/31/2015 08:50:25 Mitchell [name], Mitchell  Viona [name] ([id number]) -------------------------------------------------------------------------------- Wound Assessment Details Patient Name: Mitchell [name], Mitchell [name] Date of Service: 12/31/2015 8:00 AM Medical Record Number: [id number] Patient Account Number: [id number] Date of Birth/Sex: August 13, 1934 (81 y.o. Male) Treating RN: Montey Hora Primary Care Physician: Tedra Senegal Other Clinician: Referring Physician: Erline Levine Treating Physician/Extender: Mitchell [name] Weeks in Treatment:  0 Wound Status Wound Number: 1 Primary Open Surgical Wound Etiology: Wound Location: Back - Midline Wound Open Wounding Event: Surgical Injury Status: Date Acquired: 10/24/2015 Comorbid Cataracts, Asthma, Arrhythmia, Weeks Of Treatment: 0 History: Hypertension, Osteoarthritis Clustered Wound: No Photos Wound Measurements Length: (cm) 1.1 Width: (cm) 0.4 Depth: (cm) 7.6 Area: (cm) 0.346 Volume: (cm) 2.626 % Reduction in Area: % Reduction in Volume: Epithelialization: None Tunneling: No Undermining: No Wound Description Full Thickness Without Exposed Classification: Support Structures Wound Margin: Flat and Intact Exudate Large Amount: Exudate Type: Purulent Exudate Color: yellow, brown, green Foul Odor After Cleansing: No Wound Bed Granulation Amount: Large (67-100%) Exposed Structure Granulation Quality: Red Fascia Exposed: No Necrotic Amount: None Present (0%) Fat Layer Exposed: No Mitchell [name], [name] ([id number]) Tendon Exposed: No Muscle Exposed: No Joint Exposed: No Bone Exposed: No Limited to Skin Breakdown Periwound Skin Texture Texture Color No Abnormalities Noted: No No Abnormalities Noted: No Callus: No Atrophie Blanche: No Crepitus: No Cyanosis: No Excoriation: No Ecchymosis: No Fluctuance: No Erythema: No Friable: No Hemosiderin Staining: No Induration: No Mottled: No Localized Edema: No Pallor: No Rash: No Rubor: No Scarring: No Temperature / Pain Moisture Temperature: No Abnormality No Abnormalities Noted: No Dry / Scaly: No Maceration: No Moist: Yes Wound Preparation Ulcer Cleansing: Rinsed/Irrigated with Saline Topical Anesthetic Applied: None Treatment Notes Wound #1 (Midline Back) 1. Cleansed with: Clean wound with Normal Saline 4. Dressing Applied: Aquacel Ag Other dressing (specify in notes) 5. Secondary Dressing Applied ABD Pad 7. Secured with Tape Notes Manufacturing systems engineer) Signed: 12/31/2015  5:51:34 PM By: Montey Hora Entered By: Montey Hora on 12/31/2015 08:35:00 LEAVY, FULFORD ([id number]) MOROCCO, EISENBEIS ([id number]) -------------------------------------------------------------------------------- [place] Details Patient Name: Mitchell [name] Date of Service: 12/31/2015 8:00 AM Medical Record Number: [id number] Patient Account Number: [id number] Date of Birth/Sex: 03-Oct-1934 (81 y.o. Male) Treating RN: Montey Hora Primary Care Physician: Tedra Senegal Other Clinician: Referring Physician: Erline Levine Treating Physician/Extender: Mitchell [name] in Treatment: 0 Vital Signs Time Taken: 08:12 Temperature (F): 97.7 Height (in): 75 Pulse (bpm): 115 Source: Stated Respiratory Rate (breaths/min): 18 Weight (lbs): 272 Blood Pressure (mmHg): 142/99 Source: Stated Reference Range: 80 - 120 mg / dl Body Mass Index (BMI): 34 Electronic Signature(s) Signed: 12/31/2015 5:51:34 PM By: Montey Hora Entered By: Montey Hora on 12/31/2015 08:15:18

## 2016-01-01 NOTE — Progress Notes (Signed)
Mitchell, [name] ([id number]) Visit Report for 12/31/2015 Abuse/Suicide Risk Screen Details Patient Name: Mitchell [name], Mitchell [name] Date of Service: 12/31/2015 8:00 AM Medical Record Patient Account Number: [id number] Number: Treating RN: Montey Hora Feb 16, 1935 (81 y.o. Other Clinician: Date of Birth/Sex: Male) Treating ROBSON, MICHAEL Primary Care Physician: Tedra Senegal Physician/Extender: G Referring Physician: Maryan Char in Treatment: 0 Abuse/Suicide Risk Screen Items Answer ABUSE/SUICIDE RISK SCREEN: Has anyone close to you tried to hurt or harm you recentlyo No Do you feel uncomfortable with anyone in your familyo No Has anyone forced you do things that you didnot want to doo No Do you have any thoughts of harming yourselfo No Patient displays signs or symptoms of abuse and/or neglect. No Electronic Signature(s) Signed: 12/31/2015 5:51:34 PM By: Montey Hora Entered By: Montey Hora on 12/31/2015 08:17:11 Mitchell [name] ([id number]) -------------------------------------------------------------------------------- Activities of Daily Living Details Patient Name: Mitchell [name] Date of Service: 12/31/2015 8:00 AM Medical Record Patient Account Number: [id number] Number: Treating RN: Montey Hora 10/08/1934 (81 y.o. Other Clinician: Date of Birth/Sex: Male) Treating ROBSON, Selz Primary Care Physician: Tedra Senegal Physician/Extender: G Referring Physician: Maryan Char in Treatment: 0 Activities of Daily Living Items Answer Activities of Daily Living (Please select one for each item) Drive Automobile Not Able Take Medications Completely Able Use Telephone Completely Able Care for Appearance Completely Able Use Toilet Completely Able Bath / Shower Need Assistance Dress Self Need Assistance Feed Self Completely Able Walk Need Assistance Get In / Out Bed Need Assistance Housework Need Assistance Prepare Meals Need  Assistance Handle Money Completely Able Shop for Self Need Assistance Electronic Signature(s) Signed: 12/31/2015 5:51:34 PM By: Montey Hora Entered By: Montey Hora on 12/31/2015 08:17:47 Mitchell [name] ([id number]) -------------------------------------------------------------------------------- Education Assessment Details Patient Name: Mitchell [name] Date of Service: 12/31/2015 8:00 AM Medical Record Patient Account Number: [id number] Number: Treating RN: Montey Hora 1934/07/16 (81 y.o. Other Clinician: Date of Birth/Sex: Male) Treating ROBSON, MICHAEL Primary Care Physician: Tedra Senegal Physician/Extender: G Referring Physician: Maryan Char in Treatment: 0 Primary Learner Assessed: Caregiver Reason Patient is not Primary Learner: wound location Learning Preferences/Education Level/Primary Language Learning Preference: Explanation, Demonstration Highest Education Level: College or Above Preferred Language: English Cognitive Barrier Assessment/Beliefs Language Barrier: No Translator Needed: No Memory Deficit: No Emotional Barrier: No Cultural/Religious Beliefs Affecting Medical No Care: Physical Barrier Assessment Impaired Vision: No Impaired Hearing: No Decreased Hand dexterity: No Knowledge/Comprehension Assessment Knowledge Level: Medium Comprehension Level: Medium Ability to understand written Medium instructions: Ability to understand verbal Medium instructions: Motivation Assessment Anxiety Level: Calm Cooperation: Cooperative Education Importance: Acknowledges Need Interest in Health Problems: Asks Questions Perception: Coherent Willingness to Engage in Self- Medium Management Activities: Medium HELIOS, GELTZ ([id number]) Readiness to Engage in Self- Management Activities: Electronic Signature(s) Signed: 12/31/2015 5:51:34 PM By: Montey Hora Entered By: Montey Hora on 12/31/2015 08:18:17 ABU, SLINGLUFF  ([id number]) -------------------------------------------------------------------------------- Fall Risk Assessment Details Patient Name: Mitchell [name] Date of Service: 12/31/2015 8:00 AM Medical Record Patient Account Number: [id number] Number: Treating RN: Montey Hora [birth year] (81 y.o. Other Clinician: Date of Birth/Sex: Male) Treating ROBSON, MICHAEL Primary Care Physician: Tedra Senegal Physician/Extender: G Referring Physician: Maryan Char in Treatment: 0 Fall Risk Assessment Items Have you had 2 or more falls in the last 12 monthso 0 Yes Have you had any fall that resulted in injury in the last 12 monthso 0 Yes FALL RISK ASSESSMENT: History of falling - immediate or within 3 months 25 Yes Secondary diagnosis  0 No Ambulatory aid None/bed rest/wheelchair/nurse 0 Yes Crutches/cane/walker 15 Yes Furniture 0 No IV Access/Saline Lock 0 No Gait/Training Normal/bed rest/immobile 0 No Weak 10 Yes Impaired 0 No Mental Status Oriented to own ability 0 Yes Electronic Signature(s) Signed: 12/31/2015 5:51:34 PM By: Montey Hora Entered By: Montey Hora on 12/31/2015 08:18:38 Mitchell [name] ([id number]) -------------------------------------------------------------------------------- Nutrition Risk Assessment Details Patient Name: Mitchell [name] Date of Service: 12/31/2015 8:00 AM Medical Record Patient Account Number: [id number] Number: Treating RN: Montey Hora [birth year] (81 y.o. Other Clinician: Date of Birth/Sex: Male) Treating ROBSON, MICHAEL Primary Care Physician: Tedra Senegal Physician/Extender: G Referring Physician: Maryan Char in Treatment: 0 Height (in): 75 Weight (lbs): 272 Body Mass Index (BMI): 34 Nutrition Risk Assessment Items NUTRITION RISK SCREEN: I have an illness or condition that made me change the kind and/or 0 No amount of food I eat I eat fewer than two meals per day 0 No I eat few fruits and  vegetables, or milk products 0 No I have three or more drinks of beer, liquor or wine almost every day 0 No I have tooth or mouth problems that make it hard for me to eat 0 No I don't always have enough money to buy the food I need 0 No I eat alone most of the time 0 No I take three or more different prescribed or over-the-counter drugs a 1 Yes day Without wanting to, I have lost or gained 10 pounds in the last six 0 No months I am not always physically able to shop, cook and/or feed myself 0 No Nutrition Protocols Good Risk Protocol 0 No interventions needed Moderate Risk Protocol Electronic Signature(s) Signed: 12/31/2015 5:51:34 PM By: Montey Hora Entered By: Montey Hora on 12/31/2015 [id number]

## 2016-01-02 DIAGNOSIS — T814XXA Infection following a procedure, initial encounter: Secondary | ICD-10-CM | POA: Diagnosis not present

## 2016-01-02 DIAGNOSIS — B961 Klebsiella pneumoniae [K. pneumoniae] as the cause of diseases classified elsewhere: Secondary | ICD-10-CM | POA: Diagnosis not present

## 2016-01-02 DIAGNOSIS — M5116 Intervertebral disc disorders with radiculopathy, lumbar region: Secondary | ICD-10-CM | POA: Diagnosis not present

## 2016-01-02 DIAGNOSIS — S82891D Other fracture of right lower leg, subsequent encounter for closed fracture with routine healing: Secondary | ICD-10-CM | POA: Diagnosis not present

## 2016-01-02 DIAGNOSIS — B965 Pseudomonas (aeruginosa) (mallei) (pseudomallei) as the cause of diseases classified elsewhere: Secondary | ICD-10-CM | POA: Diagnosis not present

## 2016-01-02 DIAGNOSIS — M4800 Spinal stenosis, site unspecified: Secondary | ICD-10-CM | POA: Diagnosis not present

## 2016-01-04 LAB — AEROBIC CULTURE W GRAM STAIN (SUPERFICIAL SPECIMEN): Culture: NO GROWTH

## 2016-01-05 ENCOUNTER — Ambulatory Visit (INDEPENDENT_AMBULATORY_CARE_PROVIDER_SITE_OTHER): Payer: Medicare Other | Admitting: Internal Medicine

## 2016-01-05 ENCOUNTER — Encounter: Payer: Self-pay | Admitting: Internal Medicine

## 2016-01-05 VITALS — BP 138/88 | Temp 98.0°F | Wt 272.0 lb

## 2016-01-05 DIAGNOSIS — Z8709 Personal history of other diseases of the respiratory system: Secondary | ICD-10-CM

## 2016-01-05 DIAGNOSIS — B965 Pseudomonas (aeruginosa) (mallei) (pseudomallei) as the cause of diseases classified elsewhere: Secondary | ICD-10-CM | POA: Diagnosis not present

## 2016-01-05 DIAGNOSIS — I2510 Atherosclerotic heart disease of native coronary artery without angina pectoris: Secondary | ICD-10-CM | POA: Diagnosis not present

## 2016-01-05 DIAGNOSIS — I519 Heart disease, unspecified: Secondary | ICD-10-CM | POA: Diagnosis not present

## 2016-01-05 DIAGNOSIS — Z23 Encounter for immunization: Secondary | ICD-10-CM

## 2016-01-05 DIAGNOSIS — G4733 Obstructive sleep apnea (adult) (pediatric): Secondary | ICD-10-CM | POA: Diagnosis not present

## 2016-01-05 DIAGNOSIS — I10 Essential (primary) hypertension: Secondary | ICD-10-CM | POA: Diagnosis not present

## 2016-01-05 DIAGNOSIS — F329 Major depressive disorder, single episode, unspecified: Secondary | ICD-10-CM

## 2016-01-05 DIAGNOSIS — IMO0001 Reserved for inherently not codable concepts without codable children: Secondary | ICD-10-CM

## 2016-01-05 DIAGNOSIS — M5116 Intervertebral disc disorders with radiculopathy, lumbar region: Secondary | ICD-10-CM | POA: Diagnosis not present

## 2016-01-05 DIAGNOSIS — T814XXD Infection following a procedure, subsequent encounter: Secondary | ICD-10-CM

## 2016-01-05 DIAGNOSIS — F32A Depression, unspecified: Secondary | ICD-10-CM

## 2016-01-05 DIAGNOSIS — T814XXA Infection following a procedure, initial encounter: Secondary | ICD-10-CM | POA: Diagnosis not present

## 2016-01-05 DIAGNOSIS — S82891D Other fracture of right lower leg, subsequent encounter for closed fracture with routine healing: Secondary | ICD-10-CM | POA: Diagnosis not present

## 2016-01-05 DIAGNOSIS — B961 Klebsiella pneumoniae [K. pneumoniae] as the cause of diseases classified elsewhere: Secondary | ICD-10-CM | POA: Diagnosis not present

## 2016-01-05 DIAGNOSIS — M4800 Spinal stenosis, site unspecified: Secondary | ICD-10-CM | POA: Diagnosis not present

## 2016-01-05 NOTE — Patient Instructions (Signed)
Continue follow-up at wound care center. Restart amlodipine for high blood pressure. Use Ventolin inhaler as rescue inhaler in [place] daily. Flu vaccine given. Return in 3 months.

## 2016-01-05 NOTE — Progress Notes (Signed)
   Subjective:    Patient ID: Mitchell [name], male    DOB: 1934-12-28, 81 y.o.   MRN: [id number]  HPI 81 year old male s/p Lumbar surgery by Dr. Vertell Limber in July. He has an open wound at surgical site which is being packed daily. He recently visited the wound care center at [facility] and saw Dr. Dellia Nims. Patient has no back or leg pain. He has a right foot drop and is wearing a brace. He has a motorized scooter that he ambulates on. He had a right malleolar fracture in May. He has seen Orthopedist at [facility] recently in follow-up of that injury. He was told it was healing.  When he was in the hospital and not very mobile his blood pressure was low. Amlodipine and Lasix were discontinued. Diastolic blood pressure is elevated today at 130/88. He will restart amlodipine.  He was wheezing some this morning according to his wife. She has both albuterol and Dulera inhalers. She is questioning which one to use for an acute event. That would be albuterol. Ruthe Mannan would be a maintenance inhaler. Apparently there are conditioner when out over the weekend.    Review of Systems see above. Less depressed on Cymbalta. He will continue that.     Objective:   Physical Exam Skin warm and dry. Open wound noted lumbar area. This was repacked today here in the office. Chest is clear to auscultation without any rales or wheezing. Cardiac exam regular rate and rhythm normal S1 and S2. Affect is much brighter than at last visit.       Assessment & Plan:  Depression-improved on Cymbalta  Status post lumbar surgery with open incisional wound that is packed daily and being followed at [facility]. Recent culture had no significant growth  History of asthma  History of urethral stricture  Morbid obesity  Essential hypertension-restart amlodipine. Hold Lasix for now.  Coronary artery disease-No chest pain  Plan: Flu vaccine given. Return in 3 months. Monitor blood pressure at home.

## 2016-01-06 DIAGNOSIS — B961 Klebsiella pneumoniae [K. pneumoniae] as the cause of diseases classified elsewhere: Secondary | ICD-10-CM | POA: Diagnosis not present

## 2016-01-06 DIAGNOSIS — M5116 Intervertebral disc disorders with radiculopathy, lumbar region: Secondary | ICD-10-CM | POA: Diagnosis not present

## 2016-01-06 DIAGNOSIS — B965 Pseudomonas (aeruginosa) (mallei) (pseudomallei) as the cause of diseases classified elsewhere: Secondary | ICD-10-CM | POA: Diagnosis not present

## 2016-01-06 DIAGNOSIS — T814XXA Infection following a procedure, initial encounter: Secondary | ICD-10-CM | POA: Diagnosis not present

## 2016-01-06 DIAGNOSIS — S82891D Other fracture of right lower leg, subsequent encounter for closed fracture with routine healing: Secondary | ICD-10-CM | POA: Diagnosis not present

## 2016-01-06 DIAGNOSIS — M4800 Spinal stenosis, site unspecified: Secondary | ICD-10-CM | POA: Diagnosis not present

## 2016-01-07 DIAGNOSIS — B965 Pseudomonas (aeruginosa) (mallei) (pseudomallei) as the cause of diseases classified elsewhere: Secondary | ICD-10-CM | POA: Diagnosis not present

## 2016-01-07 DIAGNOSIS — M4800 Spinal stenosis, site unspecified: Secondary | ICD-10-CM | POA: Diagnosis not present

## 2016-01-07 DIAGNOSIS — M5116 Intervertebral disc disorders with radiculopathy, lumbar region: Secondary | ICD-10-CM | POA: Diagnosis not present

## 2016-01-07 DIAGNOSIS — S82891D Other fracture of right lower leg, subsequent encounter for closed fracture with routine healing: Secondary | ICD-10-CM | POA: Diagnosis not present

## 2016-01-07 DIAGNOSIS — T814XXA Infection following a procedure, initial encounter: Secondary | ICD-10-CM | POA: Diagnosis not present

## 2016-01-07 DIAGNOSIS — B961 Klebsiella pneumoniae [K. pneumoniae] as the cause of diseases classified elsewhere: Secondary | ICD-10-CM | POA: Diagnosis not present

## 2016-01-08 DIAGNOSIS — M5116 Intervertebral disc disorders with radiculopathy, lumbar region: Secondary | ICD-10-CM | POA: Diagnosis not present

## 2016-01-08 DIAGNOSIS — T814XXA Infection following a procedure, initial encounter: Secondary | ICD-10-CM | POA: Diagnosis not present

## 2016-01-08 DIAGNOSIS — M4800 Spinal stenosis, site unspecified: Secondary | ICD-10-CM | POA: Diagnosis not present

## 2016-01-08 DIAGNOSIS — S82891D Other fracture of right lower leg, subsequent encounter for closed fracture with routine healing: Secondary | ICD-10-CM | POA: Diagnosis not present

## 2016-01-08 DIAGNOSIS — B961 Klebsiella pneumoniae [K. pneumoniae] as the cause of diseases classified elsewhere: Secondary | ICD-10-CM | POA: Diagnosis not present

## 2016-01-08 DIAGNOSIS — B965 Pseudomonas (aeruginosa) (mallei) (pseudomallei) as the cause of diseases classified elsewhere: Secondary | ICD-10-CM | POA: Diagnosis not present

## 2016-01-12 DIAGNOSIS — B961 Klebsiella pneumoniae [K. pneumoniae] as the cause of diseases classified elsewhere: Secondary | ICD-10-CM | POA: Diagnosis not present

## 2016-01-12 DIAGNOSIS — S82891D Other fracture of right lower leg, subsequent encounter for closed fracture with routine healing: Secondary | ICD-10-CM | POA: Diagnosis not present

## 2016-01-12 DIAGNOSIS — T814XXA Infection following a procedure, initial encounter: Secondary | ICD-10-CM | POA: Diagnosis not present

## 2016-01-12 DIAGNOSIS — M5116 Intervertebral disc disorders with radiculopathy, lumbar region: Secondary | ICD-10-CM | POA: Diagnosis not present

## 2016-01-12 DIAGNOSIS — M4800 Spinal stenosis, site unspecified: Secondary | ICD-10-CM | POA: Diagnosis not present

## 2016-01-12 DIAGNOSIS — B965 Pseudomonas (aeruginosa) (mallei) (pseudomallei) as the cause of diseases classified elsewhere: Secondary | ICD-10-CM | POA: Diagnosis not present

## 2016-01-13 DIAGNOSIS — B961 Klebsiella pneumoniae [K. pneumoniae] as the cause of diseases classified elsewhere: Secondary | ICD-10-CM | POA: Diagnosis not present

## 2016-01-13 DIAGNOSIS — M4800 Spinal stenosis, site unspecified: Secondary | ICD-10-CM | POA: Diagnosis not present

## 2016-01-13 DIAGNOSIS — M5116 Intervertebral disc disorders with radiculopathy, lumbar region: Secondary | ICD-10-CM | POA: Diagnosis not present

## 2016-01-13 DIAGNOSIS — B965 Pseudomonas (aeruginosa) (mallei) (pseudomallei) as the cause of diseases classified elsewhere: Secondary | ICD-10-CM | POA: Diagnosis not present

## 2016-01-13 DIAGNOSIS — T814XXA Infection following a procedure, initial encounter: Secondary | ICD-10-CM | POA: Diagnosis not present

## 2016-01-13 DIAGNOSIS — S82891D Other fracture of right lower leg, subsequent encounter for closed fracture with routine healing: Secondary | ICD-10-CM | POA: Diagnosis not present

## 2016-01-14 ENCOUNTER — Encounter: Payer: Medicare Other | Admitting: Internal Medicine

## 2016-01-14 ENCOUNTER — Other Ambulatory Visit
Admission: RE | Admit: 2016-01-14 | Discharge: 2016-01-14 | Disposition: A | Discharge status: Home / Self Care | Payer: Medicare Other | Source: Ambulatory Visit | Attending: Internal Medicine | Admitting: Internal Medicine

## 2016-01-14 DIAGNOSIS — S82891D Other fracture of right lower leg, subsequent encounter for closed fracture with routine healing: Secondary | ICD-10-CM | POA: Diagnosis not present

## 2016-01-14 DIAGNOSIS — J45909 Unspecified asthma, uncomplicated: Secondary | ICD-10-CM | POA: Diagnosis not present

## 2016-01-14 DIAGNOSIS — T814XXA Infection following a procedure, initial encounter: Secondary | ICD-10-CM | POA: Diagnosis not present

## 2016-01-14 DIAGNOSIS — B961 Klebsiella pneumoniae [K. pneumoniae] as the cause of diseases classified elsewhere: Secondary | ICD-10-CM | POA: Diagnosis not present

## 2016-01-14 DIAGNOSIS — L089 Local infection of the skin and subcutaneous tissue, unspecified: Secondary | ICD-10-CM | POA: Insufficient documentation

## 2016-01-14 DIAGNOSIS — B965 Pseudomonas (aeruginosa) (mallei) (pseudomallei) as the cause of diseases classified elsewhere: Secondary | ICD-10-CM | POA: Diagnosis not present

## 2016-01-14 DIAGNOSIS — M1990 Unspecified osteoarthritis, unspecified site: Secondary | ICD-10-CM | POA: Diagnosis not present

## 2016-01-14 DIAGNOSIS — I10 Essential (primary) hypertension: Secondary | ICD-10-CM | POA: Diagnosis not present

## 2016-01-14 DIAGNOSIS — L98429 Non-pressure chronic ulcer of back with unspecified severity: Secondary | ICD-10-CM | POA: Diagnosis not present

## 2016-01-14 DIAGNOSIS — M5116 Intervertebral disc disorders with radiculopathy, lumbar region: Secondary | ICD-10-CM | POA: Diagnosis not present

## 2016-01-14 DIAGNOSIS — M4806 Spinal stenosis, lumbar region: Secondary | ICD-10-CM | POA: Diagnosis not present

## 2016-01-14 DIAGNOSIS — M4800 Spinal stenosis, site unspecified: Secondary | ICD-10-CM | POA: Diagnosis not present

## 2016-01-14 DIAGNOSIS — T8131XD Disruption of external operation (surgical) wound, not elsewhere classified, subsequent encounter: Secondary | ICD-10-CM | POA: Diagnosis not present

## 2016-01-16 DIAGNOSIS — B961 Klebsiella pneumoniae [K. pneumoniae] as the cause of diseases classified elsewhere: Secondary | ICD-10-CM | POA: Diagnosis not present

## 2016-01-16 DIAGNOSIS — M4800 Spinal stenosis, site unspecified: Secondary | ICD-10-CM | POA: Diagnosis not present

## 2016-01-16 DIAGNOSIS — T814XXA Infection following a procedure, initial encounter: Secondary | ICD-10-CM | POA: Diagnosis not present

## 2016-01-16 DIAGNOSIS — M5116 Intervertebral disc disorders with radiculopathy, lumbar region: Secondary | ICD-10-CM | POA: Diagnosis not present

## 2016-01-16 DIAGNOSIS — B965 Pseudomonas (aeruginosa) (mallei) (pseudomallei) as the cause of diseases classified elsewhere: Secondary | ICD-10-CM | POA: Diagnosis not present

## 2016-01-16 DIAGNOSIS — S82891D Other fracture of right lower leg, subsequent encounter for closed fracture with routine healing: Secondary | ICD-10-CM | POA: Diagnosis not present

## 2016-01-17 LAB — AEROBIC CULTURE W GRAM STAIN (SUPERFICIAL SPECIMEN)
Culture: NO GROWTH
Gram Stain: NONE SEEN

## 2016-01-19 ENCOUNTER — Ambulatory Visit (INDEPENDENT_AMBULATORY_CARE_PROVIDER_SITE_OTHER): Payer: Medicare Other | Admitting: Internal Medicine

## 2016-01-19 ENCOUNTER — Encounter: Payer: Self-pay | Admitting: Internal Medicine

## 2016-01-19 ENCOUNTER — Telehealth: Payer: Self-pay | Admitting: *Deleted

## 2016-01-19 VITALS — BP 108/78 | HR 124 | Temp 98.8°F

## 2016-01-19 DIAGNOSIS — R609 Edema, unspecified: Secondary | ICD-10-CM

## 2016-01-19 DIAGNOSIS — I2510 Atherosclerotic heart disease of native coronary artery without angina pectoris: Secondary | ICD-10-CM

## 2016-01-19 DIAGNOSIS — J9801 Acute bronchospasm: Secondary | ICD-10-CM | POA: Diagnosis not present

## 2016-01-19 DIAGNOSIS — Z8709 Personal history of other diseases of the respiratory system: Secondary | ICD-10-CM | POA: Diagnosis not present

## 2016-01-19 MED ORDER — PREDNISONE 10 MG PO TABS: ORAL_TABLET | ORAL | 0 refills | Status: DC

## 2016-01-19 MED ORDER — AZITHROMYCIN 250 MG PO TABS: ORAL_TABLET | ORAL | 0 refills | Status: DC

## 2016-01-19 NOTE — Telephone Encounter (Signed)
Ann called stating Pt is wheezing and coughing and is wanting him to be seen. Please advise.

## 2016-01-19 NOTE — Telephone Encounter (Signed)
See today

## 2016-01-19 NOTE — Patient Instructions (Addendum)
Zithromax Z pak 2 po day one followed by one po days 2-5. Sterapred DS 10 mg 6 day dosepack. Have CXR tomorrow. Use inhalers as directed.

## 2016-01-19 NOTE — Telephone Encounter (Signed)
Spoke with Lelon Frohlich, wife and provided appointment for today, 10/2 @ 4:30.  Confirmed.

## 2016-01-20 ENCOUNTER — Ambulatory Visit
Admission: RE | Admit: 2016-01-20 | Discharge: 2016-01-20 | Disposition: A | Discharge status: Home / Self Care | Payer: Medicare Other | Source: Ambulatory Visit | Attending: Internal Medicine | Admitting: Internal Medicine

## 2016-01-20 ENCOUNTER — Other Ambulatory Visit: Payer: Self-pay | Admitting: *Deleted

## 2016-01-20 ENCOUNTER — Telehealth: Payer: Self-pay | Admitting: *Deleted

## 2016-01-20 DIAGNOSIS — B961 Klebsiella pneumoniae [K. pneumoniae] as the cause of diseases classified elsewhere: Secondary | ICD-10-CM | POA: Diagnosis not present

## 2016-01-20 DIAGNOSIS — M5116 Intervertebral disc disorders with radiculopathy, lumbar region: Secondary | ICD-10-CM | POA: Diagnosis not present

## 2016-01-20 DIAGNOSIS — R05 Cough: Secondary | ICD-10-CM | POA: Diagnosis not present

## 2016-01-20 DIAGNOSIS — T814XXA Infection following a procedure, initial encounter: Secondary | ICD-10-CM | POA: Diagnosis not present

## 2016-01-20 DIAGNOSIS — J9801 Acute bronchospasm: Secondary | ICD-10-CM

## 2016-01-20 DIAGNOSIS — B965 Pseudomonas (aeruginosa) (mallei) (pseudomallei) as the cause of diseases classified elsewhere: Secondary | ICD-10-CM | POA: Diagnosis not present

## 2016-01-20 DIAGNOSIS — S82891D Other fracture of right lower leg, subsequent encounter for closed fracture with routine healing: Secondary | ICD-10-CM | POA: Diagnosis not present

## 2016-01-20 DIAGNOSIS — M4800 Spinal stenosis, site unspecified: Secondary | ICD-10-CM | POA: Diagnosis not present

## 2016-01-20 NOTE — Telephone Encounter (Signed)
Called to inform Pt and wife of the results of Pts CXR. Lvm informing them of results, also informed them that Dr. Renold Genta wants to see Pt back in 2wks, and for them to get another CXR done before that appt (orders already in chart).

## 2016-01-20 NOTE — Progress Notes (Signed)
   Subjective:    Patient ID: Mitchell [name], male    DOB: 10-17-1934, 81 y.o.   MRN: [id number]  HPI  Is here today regarding cough and wheezing. Wife says he's had cough since last visit. He has been on Cipro for a surgical site infection. He continues to see Dr. Quentin Cornwall for wound care management. He will see Dr. Vertell Limber in follow-up Wednesday, October 4.  Wife reports that his feet have been swelling and she restarted his Lasix.  No fever or shaking chills. He has a history of asthma. No shortness of breath here in the office today. He looks fine. No acute distress.    Review of Systems seems less depressed and affect is brighter.     Objective:   Physical Exam Skin is warm and dry. Nodes none. TMs are clear. Pharynx is clear. Neck is supple. Chest clear to auscultation without rales or wheezing.       Assessment & Plan:  Asthmatic bronchitis  Dependent edema  Plan: He'll have chest x-ray to see if he has element of congestive heart failure or pneumonia. They'll continue Lasix.  Zithromax Z-PAK take 2 tablets day one followed by 1 tablet days 2 through 5. Sterapred DS 10 mg six-day Dosepak. Use Symbicort 160 mg every 12 hours and Xopenex inhaler 4 times daily.  Wound care management per Dr. Dellia Nims  Follow-up with Dr. Vertell Limber Wednesday, October 4

## 2016-01-21 DIAGNOSIS — T814XXA Infection following a procedure, initial encounter: Secondary | ICD-10-CM | POA: Diagnosis not present

## 2016-01-21 DIAGNOSIS — B965 Pseudomonas (aeruginosa) (mallei) (pseudomallei) as the cause of diseases classified elsewhere: Secondary | ICD-10-CM | POA: Diagnosis not present

## 2016-01-21 DIAGNOSIS — Z6835 Body mass index (BMI) 35.0-35.9, adult: Secondary | ICD-10-CM | POA: Diagnosis not present

## 2016-01-21 DIAGNOSIS — M4800 Spinal stenosis, site unspecified: Secondary | ICD-10-CM | POA: Diagnosis not present

## 2016-01-21 DIAGNOSIS — T8189XD Other complications of procedures, not elsewhere classified, subsequent encounter: Secondary | ICD-10-CM | POA: Diagnosis not present

## 2016-01-21 DIAGNOSIS — I10 Essential (primary) hypertension: Secondary | ICD-10-CM | POA: Diagnosis not present

## 2016-01-21 DIAGNOSIS — M5116 Intervertebral disc disorders with radiculopathy, lumbar region: Secondary | ICD-10-CM | POA: Diagnosis not present

## 2016-01-21 DIAGNOSIS — B961 Klebsiella pneumoniae [K. pneumoniae] as the cause of diseases classified elsewhere: Secondary | ICD-10-CM | POA: Diagnosis not present

## 2016-01-21 DIAGNOSIS — M5416 Radiculopathy, lumbar region: Secondary | ICD-10-CM | POA: Diagnosis not present

## 2016-01-21 DIAGNOSIS — S82891D Other fracture of right lower leg, subsequent encounter for closed fracture with routine healing: Secondary | ICD-10-CM | POA: Diagnosis not present

## 2016-01-21 DIAGNOSIS — M545 Low back pain: Secondary | ICD-10-CM | POA: Diagnosis not present

## 2016-01-22 ENCOUNTER — Other Ambulatory Visit: Payer: Self-pay | Admitting: *Deleted

## 2016-01-22 DIAGNOSIS — T814XXD Infection following a procedure, subsequent encounter: Secondary | ICD-10-CM | POA: Diagnosis not present

## 2016-01-22 DIAGNOSIS — M5116 Intervertebral disc disorders with radiculopathy, lumbar region: Secondary | ICD-10-CM | POA: Diagnosis not present

## 2016-01-22 DIAGNOSIS — B965 Pseudomonas (aeruginosa) (mallei) (pseudomallei) as the cause of diseases classified elsewhere: Secondary | ICD-10-CM | POA: Diagnosis not present

## 2016-01-22 DIAGNOSIS — B961 Klebsiella pneumoniae [K. pneumoniae] as the cause of diseases classified elsewhere: Secondary | ICD-10-CM | POA: Diagnosis not present

## 2016-01-22 DIAGNOSIS — J9801 Acute bronchospasm: Secondary | ICD-10-CM

## 2016-01-22 DIAGNOSIS — M21371 Foot drop, right foot: Secondary | ICD-10-CM | POA: Diagnosis not present

## 2016-01-22 DIAGNOSIS — M4316 Spondylolisthesis, lumbar region: Secondary | ICD-10-CM | POA: Diagnosis not present

## 2016-01-23 DIAGNOSIS — B961 Klebsiella pneumoniae [K. pneumoniae] as the cause of diseases classified elsewhere: Secondary | ICD-10-CM | POA: Diagnosis not present

## 2016-01-23 DIAGNOSIS — M21371 Foot drop, right foot: Secondary | ICD-10-CM | POA: Diagnosis not present

## 2016-01-23 DIAGNOSIS — B965 Pseudomonas (aeruginosa) (mallei) (pseudomallei) as the cause of diseases classified elsewhere: Secondary | ICD-10-CM | POA: Diagnosis not present

## 2016-01-23 DIAGNOSIS — M5116 Intervertebral disc disorders with radiculopathy, lumbar region: Secondary | ICD-10-CM | POA: Diagnosis not present

## 2016-01-23 DIAGNOSIS — T814XXD Infection following a procedure, subsequent encounter: Secondary | ICD-10-CM | POA: Diagnosis not present

## 2016-01-23 DIAGNOSIS — M4316 Spondylolisthesis, lumbar region: Secondary | ICD-10-CM | POA: Diagnosis not present

## 2016-01-23 NOTE — Progress Notes (Signed)
Mitchell [name], Mitchell [name] ([id number]) Visit Report for 01/14/2016 Arrival Information Details Patient Name: Mitchell [name], Mitchell [name] Date of Service: 01/14/2016 10:00 AM Medical Record Number: [id number] Patient Account Number: [id number] Date of Birth/Sex: 1934/07/26 (81 y.o. Male) Treating RN: Montey Hora Primary Care Physician: Tedra Senegal Other Clinician: Referring Physician: Tedra Senegal Treating Physician/Extender: Tito Dine in Treatment: 2 Visit Information History Since Last Visit Added or deleted any medications: No Patient Arrived: Wheel Chair Any new allergies or adverse reactions: No Arrival Time: 10:03 Had a fall or experienced change in No activities of daily living that may affect Accompanied By: spouse risk of falls: Transfer Assistance: None Signs or symptoms of abuse/neglect since last No Patient Identification Verified: Yes visito Secondary Verification Process Yes Hospitalized since last visit: No Completed: Pain Present Now: No Electronic Signature(s) Signed: 01/15/2016 4:40:32 PM By: Montey Hora Entered By: Montey Hora on 01/14/2016 10:03:40 Mitchell [name] ([id number]) -------------------------------------------------------------------------------- Clinic Level of Care Assessment Details Patient Name: Mitchell [name] Date of Service: 01/14/2016 10:00 AM Medical Record Number: [id number] Patient Account Number: [id number] Date of Birth/Sex: 01-Sep-1934 (81 y.o. Male) Treating RN: Montey Hora Primary Care Physician: Tedra Senegal Other Clinician: Referring Physician: Tedra Senegal Treating Physician/Extender: Tito Dine in Treatment: 2 Clinic Level of Care Assessment Items TOOL 4 Quantity Score []  - Use when only an EandM is performed on FOLLOW-UP visit 0 ASSESSMENTS - Nursing Assessment / Reassessment X - Reassessment of Co-morbidities (includes updates in patient status) 1 10 X - Reassessment of Adherence to Treatment Plan 1  5 ASSESSMENTS - Wound and Skin Assessment / Reassessment X - Simple Wound Assessment / Reassessment - one wound 1 5 []  - Complex Wound Assessment / Reassessment - multiple wounds 0 []  - Dermatologic / Skin Assessment (not related to wound area) 0 ASSESSMENTS - Focused Assessment []  - Circumferential Edema Measurements - multi extremities 0 []  - Nutritional Assessment / Counseling / Intervention 0 []  - Lower Extremity Assessment (monofilament, tuning fork, pulses) 0 []  - Peripheral Arterial Disease Assessment (using hand held doppler) 0 ASSESSMENTS - Ostomy and/or Continence Assessment and Care []  - Incontinence Assessment and Management 0 []  - Ostomy Care Assessment and Management (repouching, etc.) 0 PROCESS - Coordination of Care X - Simple Patient / Family Education for ongoing care 1 15 []  - Complex (extensive) Patient / Family Education for ongoing care 0 []  - Staff obtains Programmer, systems, Records, Test Results / Process Orders 0 []  - Staff telephones HHA, Nursing Homes / Clarify orders / etc 0 []  - Routine Transfer to another Facility (non-emergent condition) 0 Mitchell [name], [name] ([id number]) []  - Routine Hospital Admission (non-emergent condition) 0 []  - New Admissions / Biomedical engineer / Ordering NPWT, Apligraf, etc. 0 []  - Emergency Hospital Admission (emergent condition) 0 X - Simple Discharge Coordination 1 10 []  - Complex (extensive) Discharge Coordination 0 PROCESS - Special Needs []  - Pediatric / Minor Patient Management 0 []  - Isolation Patient Management 0 []  - Hearing / Language / Visual special needs 0 []  - Assessment of Community assistance (transportation, D/C planning, etc.) 0 []  - Additional assistance / Altered mentation 0 []  - Support Surface(s) Assessment (bed, cushion, seat, etc.) 0 INTERVENTIONS - Wound Cleansing / Measurement X - Simple Wound Cleansing - one wound 1 5 []  - Complex Wound Cleansing - multiple wounds 0 X - Wound Imaging (photographs - any  number of wounds) 1 5 []  - Wound Tracing (instead of photographs) 0 X - Simple Wound Measurement - one wound  1 5 []  - Complex Wound Measurement - multiple wounds 0 INTERVENTIONS - Wound Dressings X - Small Wound Dressing one or multiple wounds 1 10 []  - Medium Wound Dressing one or multiple wounds 0 []  - Large Wound Dressing one or multiple wounds 0 X - Application of Medications - topical 1 5 []  - Application of Medications - injection 0 INTERVENTIONS - Miscellaneous []  - External ear exam 0 Mitchell [name], [name] ([id number]) X - Specimen Collection (cultures, biopsies, blood, body fluids, etc.) 1 5 []  - Specimen(s) / Culture(s) sent or taken to Lab for analysis 0 []  - Patient Transfer (multiple staff / Harrel Lemon Lift / Similar devices) 0 []  - Simple Staple / Suture removal (25 or less) 0 []  - Complex Staple / Suture removal (26 or more) 0 []  - Hypo / Hyperglycemic Management (close monitor of Blood Glucose) 0 []  - Ankle / Brachial Index (ABI) - do not check if billed separately 0 X - Vital Signs 1 5 Has the patient been seen at the hospital within the last three years: Yes Total Score: 85 Level Of Care: New/Established - Level 3 Electronic Signature(s) Signed: 01/15/2016 4:40:32 PM By: Montey Hora Entered By: Montey Hora on 01/15/2016 12:43:30 Mitchell [name] ([id number]) -------------------------------------------------------------------------------- Encounter Discharge Information Details Patient Name: Mitchell [name] Date of Service: 01/14/2016 10:00 AM Medical Record Number: [id number] Patient Account Number: [id number] Date of Birth/Sex: 14-Dec-1934 (81 y.o. Male) Treating RN: Montey Hora Primary Care Physician: Tedra Senegal Other Clinician: Referring Physician: Tedra Senegal Treating Physician/Extender: Tito Dine in Treatment: 2 Encounter Discharge Information Items Discharge Pain Level: 0 Discharge Condition: Stable Ambulatory Status:  Wheelchair Discharge Destination: Home Transportation: Private Auto Accompanied By: spouse Schedule Follow-up Appointment: Yes Medication Reconciliation completed and provided to Patient/Care No Mitchell [name]: Provided on Clinical Summary of Care: 01/14/2016 Form Type Recipient Paper Patient HD Electronic Signature(s) Signed: 01/15/2016 12:45:46 PM By: Montey Hora Previous Signature: 01/14/2016 11:07:37 AM Version By: Ruthine Dose Entered By: Montey Hora on 01/15/2016 12:45:46 Mitchell [name] ([id number]) -------------------------------------------------------------------------------- Multi Wound Chart Details Patient Name: Mitchell [name] Date of Service: 01/14/2016 10:00 AM Medical Record Number: [id number] Patient Account Number: [id number] Date of Birth/Sex: Mar 08, 1935 (81 y.o. Male) Treating RN: Montey Hora Primary Care Physician: Tedra Senegal Other Clinician: Referring Physician: Tedra Senegal Treating Physician/Extender: Ricard Dillon Weeks in Treatment: 2 Vital Signs Height(in): 75 Pulse(bpm): 121 Weight(lbs): 272 Blood Pressure 125/83 (mmHg): Body Mass Index(BMI): 34 Temperature(F): 97.8 Respiratory Rate 18 (breaths/min): Photos: [N/A:N/A] Wound Location: Back - Midline N/A N/A Wounding Event: Surgical Injury N/A N/A Primary Etiology: Open Surgical Wound N/A N/A Comorbid History: Cataracts, Asthma, N/A N/A Arrhythmia, Hypertension, Osteoarthritis Date Acquired: 10/24/2015 N/A N/A Weeks of Treatment: 2 N/A N/A Wound Status: Open N/A N/A Measurements L x W x D 0.9x0.5x6.6 N/A N/A (cm) Area (cm) : 0.353 N/A N/A Volume (cm) : 2.333 N/A N/A % Reduction in Area: -2.00% N/A N/A % Reduction in Volume: 11.20% N/A N/A Classification: Full Thickness Without N/A N/A Exposed Support Structures Exudate Amount: Large N/A N/A Exudate Type: Purulent N/A N/A Exudate Color: yellow, brown, green N/A N/A Wound Margin: Flat and Intact N/A N/A Granulation  Amount: Large (67-100%) N/A N/A Mitchell [name], [name] ([id number]) Granulation Quality: Red N/A N/A Necrotic Amount: None Present (0%) N/A N/A Exposed Structures: Fascia: No N/A N/A Fat: No Tendon: No Muscle: No Joint: No Bone: No Limited to Skin Breakdown Epithelialization: None N/A N/A Periwound Skin Texture: Edema: No N/A N/A Excoriation: No Induration: No Callus: No  Crepitus: No Fluctuance: No Friable: No Rash: No Scarring: No Periwound Skin Moist: Yes N/A N/A Moisture: Maceration: No Dry/Scaly: No Periwound Skin Color: Atrophie Blanche: No N/A N/A Cyanosis: No Ecchymosis: No Erythema: No Hemosiderin Staining: No Mottled: No Pallor: No Rubor: No Temperature: No Abnormality N/A N/A Tenderness on No N/A N/A Palpation: Wound Preparation: Ulcer Cleansing: N/A N/A Rinsed/Irrigated with Saline Topical Anesthetic Applied: None Treatment Notes Electronic Signature(s) Signed: 01/15/2016 4:40:32 PM By: Montey Hora Entered By: Montey Hora on 01/14/2016 10:12:48 Mitchell [name] ([id number]) -------------------------------------------------------------------------------- [place] Details Patient Name: Mitchell [name] Date of Service: 01/14/2016 10:00 AM Medical Record Number: [id number] Patient Account Number: [id number] Date of Birth/Sex: 1934-08-12 (81 y.o. Male) Treating RN: Montey Hora Primary Care Physician: Tedra Senegal Other Clinician: Referring Physician: Tedra Senegal Treating Physician/Extender: Tito Dine in Treatment: 2 Active Inactive Abuse / Safety / Falls / Self Care Management Nursing Diagnoses: Impaired physical mobility Potential for falls Goals: Patient will remain injury free Date Initiated: 12/31/2015 Goal Status: Active Interventions: Assess fall risk on admission and as needed Notes: Orientation to the Wound Care Program Nursing Diagnoses: Knowledge deficit related to the wound healing center  program Goals: Patient/caregiver will verbalize understanding of the [place] Program Date Initiated: 12/31/2015 Goal Status: Active Interventions: Provide education on orientation to the wound center Notes: Wound/Skin Impairment Nursing Diagnoses: Impaired tissue integrity Goals: Patient/caregiver will verbalize understanding of skin care regimen Mitchell [name], Mitchell [name] ([id number]) Date Initiated: 12/31/2015 Goal Status: Active Ulcer/skin breakdown will have a volume reduction of 30% by week 4 Date Initiated: 12/31/2015 Goal Status: Active Ulcer/skin breakdown will have a volume reduction of 50% by week 8 Date Initiated: 12/31/2015 Goal Status: Active Ulcer/skin breakdown will have a volume reduction of 80% by week 12 Date Initiated: 12/31/2015 Goal Status: Active Ulcer/skin breakdown will heal within 14 weeks Date Initiated: 12/31/2015 Goal Status: Active Interventions: Assess patient/caregiver ability to obtain necessary supplies Assess patient/caregiver ability to perform ulcer/skin care regimen upon admission and as needed Assess ulceration(s) every visit Notes: Electronic Signature(s) Signed: 01/15/2016 4:40:32 PM By: Montey Hora Entered By: Montey Hora on 01/14/2016 10:12:38 Mitchell [name] ([id number]) -------------------------------------------------------------------------------- Pain Assessment Details Patient Name: Mitchell [name] Date of Service: 01/14/2016 10:00 AM Medical Record Number: [id number] Patient Account Number: [id number] Date of Birth/Sex: 1934/04/29 (81 y.o. Male) Treating RN: Montey Hora Primary Care Physician: Tedra Senegal Other Clinician: Referring Physician: Tedra Senegal Treating Physician/Extender: Ricard Dillon Weeks in Treatment: 2 Active Problems Location of Pain Severity and Description of Pain Patient Has Paino No Site Locations Pain Management and Medication Current Pain Management: Notes Topical or injectable  lidocaine is offered to patient for acute pain when surgical debridement is performed. If needed, Patient is instructed to use over the counter pain medication for the following 24-48 hours after debridement. Wound care MDs do not prescribed pain medications. Patient has chronic pain or uncontrolled pain. Patient has been instructed to make an appointment with their Primary Care Physician for pain management. Electronic Signature(s) Signed: 01/15/2016 4:40:32 PM By: Montey Hora Entered By: Montey Hora on 01/14/2016 10:03:51 Mitchell [name] ([id number]) -------------------------------------------------------------------------------- Patient/Caregiver Education Details Patient Name: Mitchell [name] Date of Service: 01/14/2016 10:00 AM Medical Record Number: [id number] Patient Account Number: [id number] Date of Birth/Gender: [birth year] (80 y.o. Male) Treating RN: Montey Hora Primary Care Physician: Tedra Senegal Other Clinician: Referring Physician: Tedra Senegal Treating Physician/Extender: Tito Dine in Treatment: 2 Education Assessment Education Provided To: Caregiver Education Topics Provided Basic Hygiene:  Handouts: Other: skin care of peri wound skin Methods: Explain/Verbal Responses: State content correctly Electronic Signature(s) Signed: 01/15/2016 4:40:32 PM By: Montey Hora Entered By: Montey Hora on 01/15/2016 12:46:41 Mitchell [name], Mitchell [name] ([id number]) -------------------------------------------------------------------------------- Wound Assessment Details Patient Name: Mitchell [name] Date of Service: 01/14/2016 10:00 AM Medical Record Number: [id number] Patient Account Number: [id number] Date of Birth/Sex: Mar 24, 1935 (81 y.o. Male) Treating RN: Montey Hora Primary Care Physician: Tedra Senegal Other Clinician: Referring Physician: Tedra Senegal Treating Physician/Extender: Tito Dine in Treatment: 2 Wound Status Wound Number: 1  Primary Open Surgical Wound Etiology: Wound Location: Midline Back Wound Open Wounding Event: Surgical Injury Status: Date Acquired: 10/24/2015 Comorbid Cataracts, Asthma, Arrhythmia, Weeks Of Treatment: 2 History: Hypertension, Osteoarthritis Clustered Wound: No Photos Wound Measurements Length: (cm) 0.9 Width: (cm) 0.5 Depth: (cm) 7.2 Area: (cm) 0.353 Volume: (cm) 2.545 % Reduction in Area: -2% % Reduction in Volume: 3.1% Epithelialization: None Tunneling: No Undermining: No Wound Description Full Thickness Without Exposed Classification: Support Structures Wound Margin: Flat and Intact Exudate Large Amount: Exudate Type: Purulent Exudate Color: yellow, brown, green Foul Odor After Cleansing: No Wound Bed Granulation Amount: Large (67-100%) Exposed Structure Granulation Quality: Red Fascia Exposed: No Necrotic Amount: None Present (0%) Fat Layer Exposed: No Mitchell [name], [name] ([id number]) Tendon Exposed: No Muscle Exposed: No Joint Exposed: No Bone Exposed: No Limited to Skin Breakdown Periwound Skin Texture Texture Color No Abnormalities Noted: No No Abnormalities Noted: No Callus: No Atrophie Blanche: No Crepitus: No Cyanosis: No Excoriation: No Ecchymosis: No Fluctuance: No Erythema: No Friable: No Hemosiderin Staining: No Induration: No Mottled: No Localized Edema: No Pallor: No Rash: No Rubor: No Scarring: No Temperature / Pain Moisture Temperature: No Abnormality No Abnormalities Noted: No Dry / Scaly: No Maceration: No Moist: Yes Wound Preparation Ulcer Cleansing: Rinsed/Irrigated with Saline Topical Anesthetic Applied: None Treatment Notes Wound #1 (Midline Back) 1. Cleansed with: Clean wound with Normal Saline 3. Peri-wound Care: Antifungal cream 4. Dressing Applied: Aquacel Ag Other dressing (specify in notes) 5. Secondary Dressing Applied ABD Pad 7. Secured with Paper tape Notes Nurse, mental health) Signed: 01/15/2016 4:40:32 PM By: Maryclare Bean, Mitchell [name] ([id number]) Entered By: Montey Hora on 01/14/2016 10:42:57 Mitchell [name], Mitchell [name] ([id number]) -------------------------------------------------------------------------------- Vitals Details Patient Name: Mitchell [name] Date of Service: 01/14/2016 10:00 AM Medical Record Number: [id number] Patient Account Number: [id number] Date of Birth/Sex: 06-26-1934 (81 y.o. Male) Treating RN: Montey Hora Primary Care Physician: Tedra Senegal Other Clinician: Referring Physician: Tedra Senegal Treating Physician/Extender: Tito Dine in Treatment: 2 Vital Signs Time Taken: 10:03 Temperature (F): 97.8 Height (in): 75 Pulse (bpm): 121 Weight (lbs): 272 Respiratory Rate (breaths/min): 18 Body Mass Index (BMI): 34 Blood Pressure (mmHg): 125/83 Reference Range: 80 - 120 mg / dl Electronic Signature(s) Signed: 01/15/2016 4:40:32 PM By: Montey Hora Entered By: Montey Hora on 01/14/2016 10:05:00

## 2016-01-23 NOTE — Progress Notes (Signed)
KELON, CONKIN ([id number]) Visit Report for 01/14/2016 Chief Complaint Document Details Patient Name: Mitchell [name], Mitchell [name] Date of Service: 01/14/2016 10:00 AM Medical Record Patient Account Number: [id number] Number: Treating RN: Montey Hora 1934/06/21 (81 y.o. Other Clinician: Date of Birth/Sex: Male) Treating Sharda Keddy Primary Care Physician: Tedra Senegal Physician/Extender: G Referring Physician: Assunta Gambles in Treatment: 2 Information Obtained from: Patient Chief Complaint 12/31/15; 81 year old man admitted to clinic for review of his surgical wound in his lumbar spine area Electronic Signature(s) Signed: 01/23/2016 8:10:29 AM By: Linton Ham MD Entered By: Linton Ham on 01/14/2016 10:57:11 Mitchell [name] ([id number]) -------------------------------------------------------------------------------- HPI Details Patient Name: Mitchell [name] Date of Service: 01/14/2016 10:00 AM Medical Record Patient Account Number: [id number] Number: Treating RN: Montey Hora Mar 16, 1935 (81 y.o. Other Clinician: Date of Birth/Sex: Male) Treating Keiden Deskin Primary Care Physician: Tedra Senegal Physician/Extender: G Referring Physician: Tedra Senegal Weeks in Treatment: 2 History of Present Illness HPI Description: 12/31/15; this is a patient to was increasingly incapacitated over the last year with severe lumbar spinal stenosis and radiculopathy at L4-L5 L5-S1 with a large disc herniation at L2-L3. He had several spinal injections over the last year with no relief in his pain. On July 7 17 he had a redo decompression with fusion at L4-L5 L5-S1 and a laminectomy and microdiscectomy of L2-L3 with pedicle screw fixation L2-S1. Is not really clear to me at the time of this dictation as to the exact course of this wound. However he was discharged to rehabilitation at [facility] and it was very clear at the end of the stay here that there was an  open area for which she had a wound VAC for a period of time. A culture on 10/29/15 showed both Pseudomonas and Klebsiella. He was discharged on ciprofloxacin and he still remains on that currently. He has home health going into his home now and doing iodoform packing. He has a follow-up with his neurosurgeon Dr. Vertell Limber tomorrow. There is still moderate amount of drainage. However the patient denies fever or chills or excessive pain. He is working hard with physical therapy in order to gain ambulatory status. He has a right foot drop for which he uses an AFO brace. As far as I can tell he has not had any advanced imaging of the low back although he apparently a has had plain x-rays in the neurosurgeon's office. No recent cultures. 01/14/16; culture from 2 weeks ago was negative. He went to sees Dr. Vertell Limber last week who is still was reluctant to proceed with any more imaging studies, they follow with him on 02/18/2023. They're using Aquacel Ag packing but still having copious amounts of clear yellowish drainage. This is being changed once a day. He has not been systemically unwell, no pain no fever and really no complaints other than the periwound is itchy Electronic Signature(s) Signed: 01/23/2016 8:10:29 AM By: Linton Ham MD Entered By: Linton Ham on 01/14/2016 10:58:32 Mitchell [name] ([id number]) -------------------------------------------------------------------------------- Physical Exam Details Patient Name: Mitchell [name] Date of Service: 01/14/2016 10:00 AM Medical Record Patient Account Number: [id number] Number: Treating RN: Montey Hora 1935-02-04 (81 y.o. Other Clinician: Date of Birth/Sex: Male) Treating Kaine Mcquillen Primary Care Physician: Tedra Senegal Physician/Extender: G Referring Physician: Tedra Senegal Weeks in Treatment: 2 Constitutional Sitting or standing Blood Pressure is within target range for patient.[name] Respirations regular, non-labored  and within target range.. Temperature is normal and within the target range for the patient.. Patient's appearance is  neat and clean. Appears in no acute distress. Well nourished and well developed.. Eyes Conjunctivae clear. No discharge.[name] Respiratory Respiratory effort is easy and symmetric bilaterally. Rate is normal at rest and on room air.. Integumentary (Hair, Skin) The periwound is erythematous with some satellite lesions compatible with candidal skin infection. Psychiatric No evidence of depression, anxiety, or agitation. Calm, cooperative, and communicative. Appropriate interactions and affect.. Notes Wound exam; the patient has a small orifice at roughly the L3 level. I measured this now at 7 cm. Culture of the deep recesses of this wound is done. There is no palpable surrounding tenderness no crepitus however the skin looks like it could have a periwound candidal skin Electronic Signature(s) Signed: 01/23/2016 8:10:29 AM By: Linton Ham MD Entered By: Linton Ham on 01/14/2016 11:00:54 Mitchell [name] ([id number]) -------------------------------------------------------------------------------- Physician Orders Details Patient Name: Mitchell [name] Date of Service: 01/14/2016 10:00 AM Medical Record Patient Account Number: [id number] Number: Treating RN: Montey Hora 1934-12-29 (81 y.o. Other Clinician: Date of Birth/Sex: Male) Treating Jayvion Stefanski Primary Care Physician: Tedra Senegal Physician/Extender: G Referring Physician: Assunta Gambles in Treatment: 2 Verbal / Phone Orders: Yes Clinician: Dorthy, Joanna Read Back and Verified: Yes Diagnosis Coding Wound Cleansing Wound #1 Midline Back o Clean wound with Normal Saline. o May Shower, gently pat wound dry prior to applying new dressing. Anesthetic Wound #1 Midline Back o Topical Lidocaine 4% cream applied to wound bed prior to debridement Primary Wound Dressing Wound #1  Midline Back o Aquacel Ag - aquacel ag rope - HHRN to provide this to patient Secondary Dressing Wound #1 Midline Back o XtraSorb - HHRN please provide xtrasorb to patient - may use ABD pad OVER xtrasorb for added protection but xtrasorb will protect periwound skin from moisture Dressing Change Frequency Wound #1 Midline Back o Change dressing every day. Follow-up Appointments Wound #1 Midline Back o Return Appointment in 1 week. Home Health Wound #1 Midline Back o [place] Visits - [place] Nurse may visit PRN to address patientos wound care needs. o FACE TO FACE ENCOUNTER: MEDICARE and MEDICAID PATIENTS: I certify that this patient is under my care and that I had a face-to-face encounter that meets the physician face-to-face encounter requirements with this patient on this date. The encounter with the patient was in [place], [place] OP[place]ARD. ([id number]) whole or in part for the following MEDICAL CONDITION: (primary reason for [place]) MEDICAL NECESSITY: I certify, that based on my findings, NURSING services are a medically necessary home health service. HOME BOUND STATUS: I certify that my clinical findings support that this patient is homebound (i.e., Due to illness or injury, pt requires aid of supportive devices such as crutches, cane, wheelchairs, walkers, the use of special transportation or the assistance of another person to leave their place of residence. There is a normal inability to leave the home and doing so requires considerable and taxing effort. Other absences are for medical reasons / religious services and are infrequent or of short duration when for other reasons). o If current dressing causes regression in wound condition, may D/C ordered dressing product/s and apply Normal Saline Moist Dressing daily until next [place] / Other MD appointment. [place] of regression in wound condition at  [phone]. o Please direct any NON-WOUND related issues/requests for orders to patient's Primary Care Physician Laboratory o Bacteria identified in Wound by Culture (MICRO) - back oooo LOINC Code: [id number] oooo Convenience Name: Wound culture routine Electronic  Signature(s) Signed: 01/15/2016 4:40:32 PM By: Montey Hora Signed: 01/23/2016 8:10:29 AM By: Linton Ham MD Entered By: Montey Hora on 01/14/2016 10:45:41 ANTWINE, MCKENSIE ([id number]) -------------------------------------------------------------------------------- Problem List Details Patient Name: Mitchell [name] Date of Service: 01/14/2016 10:00 AM Medical Record Patient Account Number: [id number] Number: Treating RN: Montey Hora 1934/08/29 (81 y.o. 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 Other Clinician: Date of Birth/Sex: Male) Treating Carrolyn Hilmes Primary Care Physician: Tedra Senegal Physician/Extender: G Referring Physician: Assunta Gambles in Treatment: 2 Subjective Chief Complaint Information obtained from Patient 12/31/15; 81 year old man admitted to clinic for review of his surgical wound in his lumbar spine area History of Present Illness (HPI) 12/31/15; this is a patient to was increasingly incapacitated over the last year with severe lumbar spinal stenosis and radiculopathy at L4-L5 L5-S1 with a large disc herniation at L2-L3. He had several spinal injections over the last year with no relief in his pain. On July 7 17 he had a redo decompression with fusion at L4-L5 L5-S1 and a laminectomy and microdiscectomy of L2-L3 with pedicle screw fixation L2-S1. Is not really clear to me at the time of this dictation as to the exact course of this wound. However he was discharged to rehabilitation at [facility] and it was very clear at the end of the stay here that there was an open area for which she had a wound VAC for a period of time. A culture on 10/29/15 showed both Pseudomonas and Klebsiella. He was discharged on ciprofloxacin and he still remains on that currently. He has home health going into his home now and doing iodoform packing. He has a follow-up with his neurosurgeon Dr. Vertell Limber tomorrow. There is still moderate amount of drainage. However the patient denies fever or chills or excessive pain. He is working hard with physical therapy in order to gain ambulatory status. He has a right foot drop for which he uses an AFO brace. As far as I can tell he has not had any advanced imaging of the low back although he apparently a has had plain x-rays in the neurosurgeon's office. No recent cultures. 01/14/16; culture from 2 weeks ago was negative. He went to sees Dr. Vertell Limber last week who is still was reluctant to proceed with any more imaging studies, they follow with him on 23-Jan-2023. They're using Aquacel Ag packing but still having copious amounts of clear yellowish drainage. This is being changed once a day. He has  not been systemically unwell, no pain no fever and really no complaints other than the periwound is itchy Objective Constitutional Sitting or standing Blood Pressure is within target range for patient.[name] Respirations regular, non-labored and within target range.. Temperature is normal and within the target range for the patient.. Patient's appearance is neat and clean. Appears in no acute distress. Well  nourished and well developed.Constance Goltz, Emiliano Dyer ([id number]) Vitals Time Taken: 10:03 AM, Height: 75 in, Weight: 272 lbs, BMI: 34, Temperature: 97.8 F, Pulse: 121 bpm, Respiratory Rate: 18 breaths/min, Blood Pressure: 125/83 mmHg. Eyes Conjunctivae clear. No discharge.[name] Respiratory Respiratory effort is easy and symmetric bilaterally. Rate is normal at rest and on room air.[name] Psychiatric No evidence of depression, anxiety, or agitation. Calm, cooperative, and communicative. Appropriate interactions and affect.. General Notes: Wound exam; the patient has a small orifice at roughly the L3 level. I measured this now at 7 cm. Culture of the deep recesses of this wound is done. There is no palpable surrounding tenderness no crepitus however the skin looks like it could have a periwound candidal skin Integumentary (Hair, Skin) The periwound is erythematous with some satellite lesions compatible with candidal skin infection. Wound #1 status is Open. Original cause of wound was Surgical Injury. The wound is located on the Midline Back. The wound measures 0.9cm length x 0.5cm width x 7.2cm depth; 0.353cm^2 area and 2.545cm^3 volume. The wound is limited to skin breakdown. There is no tunneling or undermining noted. There is a large amount of purulent drainage noted. The wound margin is flat and intact. There is large (67-100%) red granulation within the wound bed. There is no necrotic tissue within the wound bed. The periwound skin appearance exhibited: Moist. The periwound skin appearance did not  exhibit: Callus, Crepitus, Excoriation, Fluctuance, Friable, Induration, Localized Edema, Rash, Scarring, Dry/Scaly, Maceration, Atrophie Blanche, Cyanosis, Ecchymosis, Hemosiderin Staining, Mottled, Pallor, Rubor, Erythema. Periwound temperature was noted as No Abnormality. Assessment Active Problems ICD-10 T81.31XD - Disruption of external operation (surgical) wound, not elsewhere classified, subsequent encounter M48.06 - Spinal stenosis, lumbar region L98.429 - Non-pressure chronic ulcer of back with unspecified severity Corniel, Deverick W. ([id number]) Plan Wound Cleansing: Wound #1 Midline Back: Clean wound with Normal Saline. May Shower, gently pat wound dry prior to applying new dressing. Anesthetic: Wound #1 Midline Back: Topical Lidocaine 4% cream applied to wound bed prior to debridement Primary Wound Dressing: Wound #1 Midline Back: Aquacel Ag - aquacel ag rope - HHRN to provide this to patient Secondary Dressing: Wound #1 Midline Back: XtraSorb - HHRN please provide xtrasorb to patient - may use ABD pad OVER xtrasorb for added protection but xtrasorb will protect periwound skin from moisture Dressing Change Frequency: Wound #1 Midline Back: Change dressing every day. Follow-up Appointments: Wound #1 Midline Back: Return Appointment in 1 week. Home Health: Wound #1 Midline Back: Continue Home Health Visits - [facility] Nurse may visit PRN to address patient s wound care needs. FACE TO FACE ENCOUNTER: MEDICARE and MEDICAID PATIENTS: I certify that this patient is under my care and that I had a face-to-face encounter that meets the physician face-to-face encounter requirements with this patient on this date. The encounter with the patient was in whole or in part for the following MEDICAL CONDITION: (primary reason for [place]) MEDICAL NECESSITY: I certify, that based on my findings, NURSING services are a medically necessary home health service.  HOME BOUND STATUS: I certify that my clinical findings support that this patient is homebound (i.e., Due to illness or injury, pt requires aid of supportive devices such as crutches, cane, wheelchairs, walkers, the use of special transportation or the assistance of another person to leave their place of residence. There is a normal inability to leave the home and doing so requires considerable and taxing effort. Other absences are for medical reasons / religious services and are infrequent  or of short duration when for other reasons). If current dressing causes regression in wound condition, may D/C ordered dressing product/s and apply Normal Saline Moist Dressing daily until next [place] / Other MD appointment. [place] of regression in wound condition at [phone]. Please direct any NON-WOUND related issues/requests for orders to patient's Primary Care Physician Laboratory ordered were: Wound culture routine - back Yip, Kareen W. ([id number]) #1 I'm going to change the dressing frequency to twice daily. There are medical people in his family that can help with this including a daughter who is of that who accompanied him today and they stepdaughter who is a nurse #2 I continue to think this area is going to need to be imaged whether that's with a CT scan or an MRI all leave to Dr. Vertell Limber. This wound area is probably an exit site for something and as such isn't going to heal until the amount of drainage is reduced. #3 for sacral completeness I have done a Doppler deep culture of this but have not provided any empiric antibiotics. #4 will follow up in this in 2 weeks Electronic Signature(s) Signed: 01/23/2016 8:10:29 AM By: Linton Ham MD Entered By: Linton Ham on 01/14/2016 11:02:21 JABORI, WIESS ([id number]) -------------------------------------------------------------------------------- SuperBill Details Patient Name: Mitchell [name]  Date of Service: 01/14/2016 Medical Record Patient Account Number: [id number] Number: Treating RN: Montey Hora 1934/09/12 (81 y.o. 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

## 2016-01-26 DIAGNOSIS — M21371 Foot drop, right foot: Secondary | ICD-10-CM | POA: Diagnosis not present

## 2016-01-26 DIAGNOSIS — B965 Pseudomonas (aeruginosa) (mallei) (pseudomallei) as the cause of diseases classified elsewhere: Secondary | ICD-10-CM | POA: Diagnosis not present

## 2016-01-26 DIAGNOSIS — T814XXD Infection following a procedure, subsequent encounter: Secondary | ICD-10-CM | POA: Diagnosis not present

## 2016-01-26 DIAGNOSIS — M4316 Spondylolisthesis, lumbar region: Secondary | ICD-10-CM | POA: Diagnosis not present

## 2016-01-26 DIAGNOSIS — B961 Klebsiella pneumoniae [K. pneumoniae] as the cause of diseases classified elsewhere: Secondary | ICD-10-CM | POA: Diagnosis not present

## 2016-01-26 DIAGNOSIS — M5116 Intervertebral disc disorders with radiculopathy, lumbar region: Secondary | ICD-10-CM | POA: Diagnosis not present

## 2016-01-28 ENCOUNTER — Encounter: Payer: Medicare Other | Attending: Physician Assistant | Admitting: Internal Medicine

## 2016-01-28 DIAGNOSIS — M5116 Intervertebral disc disorders with radiculopathy, lumbar region: Secondary | ICD-10-CM | POA: Diagnosis not present

## 2016-01-28 DIAGNOSIS — T8131XD Disruption of external operation (surgical) wound, not elsewhere classified, subsequent encounter: Secondary | ICD-10-CM | POA: Diagnosis not present

## 2016-01-28 DIAGNOSIS — M48061 Spinal stenosis, lumbar region without neurogenic claudication: Secondary | ICD-10-CM | POA: Insufficient documentation

## 2016-01-28 DIAGNOSIS — M4316 Spondylolisthesis, lumbar region: Secondary | ICD-10-CM | POA: Diagnosis not present

## 2016-01-28 DIAGNOSIS — Y839 Surgical procedure, unspecified as the cause of abnormal reaction of the patient, or of later complication, without mention of misadventure at the time of the procedure: Secondary | ICD-10-CM | POA: Insufficient documentation

## 2016-01-28 DIAGNOSIS — M21371 Foot drop, right foot: Secondary | ICD-10-CM | POA: Diagnosis not present

## 2016-01-28 DIAGNOSIS — I10 Essential (primary) hypertension: Secondary | ICD-10-CM | POA: Insufficient documentation

## 2016-01-28 DIAGNOSIS — T8131XA Disruption of external operation (surgical) wound, not elsewhere classified, initial encounter: Secondary | ICD-10-CM | POA: Diagnosis not present

## 2016-01-28 DIAGNOSIS — B965 Pseudomonas (aeruginosa) (mallei) (pseudomallei) as the cause of diseases classified elsewhere: Secondary | ICD-10-CM | POA: Diagnosis not present

## 2016-01-28 DIAGNOSIS — B961 Klebsiella pneumoniae [K. pneumoniae] as the cause of diseases classified elsewhere: Secondary | ICD-10-CM | POA: Diagnosis not present

## 2016-01-28 DIAGNOSIS — J45909 Unspecified asthma, uncomplicated: Secondary | ICD-10-CM | POA: Diagnosis not present

## 2016-01-28 DIAGNOSIS — M1990 Unspecified osteoarthritis, unspecified site: Secondary | ICD-10-CM | POA: Diagnosis not present

## 2016-01-28 DIAGNOSIS — L98429 Non-pressure chronic ulcer of back with unspecified severity: Secondary | ICD-10-CM | POA: Diagnosis not present

## 2016-01-28 DIAGNOSIS — T814XXD Infection following a procedure, subsequent encounter: Secondary | ICD-10-CM | POA: Diagnosis not present

## 2016-01-29 ENCOUNTER — Other Ambulatory Visit: Payer: Self-pay | Admitting: Internal Medicine

## 2016-01-29 DIAGNOSIS — M4316 Spondylolisthesis, lumbar region: Secondary | ICD-10-CM | POA: Diagnosis not present

## 2016-01-29 DIAGNOSIS — B965 Pseudomonas (aeruginosa) (mallei) (pseudomallei) as the cause of diseases classified elsewhere: Secondary | ICD-10-CM | POA: Diagnosis not present

## 2016-01-29 DIAGNOSIS — B961 Klebsiella pneumoniae [K. pneumoniae] as the cause of diseases classified elsewhere: Secondary | ICD-10-CM | POA: Diagnosis not present

## 2016-01-29 DIAGNOSIS — M5116 Intervertebral disc disorders with radiculopathy, lumbar region: Secondary | ICD-10-CM | POA: Diagnosis not present

## 2016-01-29 DIAGNOSIS — M21371 Foot drop, right foot: Secondary | ICD-10-CM | POA: Diagnosis not present

## 2016-01-29 DIAGNOSIS — T814XXD Infection following a procedure, subsequent encounter: Secondary | ICD-10-CM | POA: Diagnosis not present

## 2016-01-29 NOTE — Progress Notes (Signed)
Mitchell, [name] ([id number]) Visit Report for 01/28/2016 Chief Complaint Document Details Patient Name: Mitchell [name] Date of Service: 01/28/2016 10:45 AM Medical Record Number: [id number] Patient Account Number: [id number] Date of Birth/Sex: 14-Dec-1934 (81 y.o. Male) Treating RN: Montey Hora Primary Care Physician: Tedra Senegal Other Clinician: Referring Physician: Tedra Senegal Treating Physician/Extender: Sharalyn Ink in Treatment: 4 Information Obtained from: Patient Chief Complaint 01/28/16 81 year old man admitted to clinic for review of his surgical wound in his lumbar spine area Electronic Signature(s) Signed: 01/28/2016 4:52:13 PM By: Worthy Keeler PA-C Entered By: Worthy Keeler on 01/28/2016 13:06:46 Mitchell, [name] ([id number]) -------------------------------------------------------------------------------- HPI Details Patient Name: Mitchell [name] Date of Service: 01/28/2016 10:45 AM Medical Record Number: [id number] Patient Account Number: [id number] Date of Birth/Sex: [birth year] (81 y.o. Male) Treating RN: Montey Hora Primary Care Physician: Tedra Senegal Other Clinician: Referring Physician: Tedra Senegal Treating Physician/Extender: Melburn Hake, Narada Uzzle Weeks in Treatment: 4 History of Present Illness HPI Description: 12/31/15; this is a patient to was increasingly incapacitated over the last year with severe lumbar spinal stenosis and radiculopathy at L4-L5 L5-S1 with a large disc herniation at L2-L3. He had several spinal injections over the last year with no relief in his pain. On July 7 17 he had a redo decompression with fusion at L4-L5 L5-S1 and a laminectomy and microdiscectomy of L2-L3 with pedicle screw fixation L2-S1. Is not really clear to me at the time of this dictation as to the exact course of this wound. However he was discharged to rehabilitation at [facility] and it was very clear at the end of the stay here that there was an  open area for which she had a wound VAC for a period of time. A culture on 10/29/15 showed both Pseudomonas and Klebsiella. He was discharged on ciprofloxacin and he still remains on that currently. He has home health going into his home now and doing iodoform packing. He has a follow-up with his neurosurgeon Dr. Vertell Limber tomorrow. There is still moderate amount of drainage. However the patient denies fever or chills or excessive pain. He is working hard with physical therapy in order to gain ambulatory status. He has a right foot drop for which he uses an AFO brace. As far as I can tell he has not had any advanced imaging of the low back although he apparently a has had plain x-rays in the neurosurgeon's office. No recent cultures. 01/14/16; culture from 2 weeks ago was negative. He went to sees Dr. Vertell Limber last week who is still was reluctant to proceed with any more imaging studies, they follow with him on 02-17-23. They're using Aquacel Ag packing but still having copious amounts of clear yellowish drainage. This is being changed once a day. He has not been systemically unwell, no pain no fever and really no complaints other than the periwound is itchy 01/28/16 at this point in time patient has had a follow-up visit with Dr. Melven Sartorius neurosurgeon who is still not recommending any further advanced imaging studies at this point in time. We have been using Aquacel Ag packing although Dr. Vertell Limber didn't want him not to pack this as tightly. This is being changed daily. He has no signs or symptoms of systemic infection and his main issue is that he has a rash and itching around the periwound region. They have been using over-the-counter antifungal cream nothing prescribed up to this point. Fortunately he is not having a significant amount of pain at this point in  time and rates his discomfort to be a 1-2 out of 10 Electronic Signature(s) Signed: 01/28/2016 4:52:13 PM By: Worthy Keeler PA-C Entered By:  Worthy Keeler on 01/28/2016 13:08:21 Mitchell, [name] ([id number]) -------------------------------------------------------------------------------- Physical Exam Details Patient Name: Mitchell [name] Date of Service: 01/28/2016 10:45 AM Medical Record Number: [id number] Patient Account Number: [id number] Date of Birth/Sex: [birth year] (81 y.o. Male) Treating RN: Montey Hora Primary Care Physician: Tedra Senegal Other Clinician: Referring Physician: Tedra Senegal Treating Physician/Extender: Melburn Hake, Dana Debo Weeks in Treatment: 4 Constitutional Well-nourished and well-hydrated in no acute distress. Respiratory normal breathing without difficulty. Psychiatric this patient is able to make decisions and demonstrates good insight into disease process. Alert and Oriented x 3. pleasant and cooperative. Notes Foortunately patiient's wound culture came back negative for any organisms at this point in time. This is good news. Subsequently he does seem to have some periwound fungal infection likely. This wound still tracts deeply toward the cephalad region. Electronic Signature(s) Signed: 01/28/2016 4:52:13 PM By: Worthy Keeler PA-C Entered By: Worthy Keeler on 01/28/2016 13:09:29 Mitchell, [name] ([id number]) -------------------------------------------------------------------------------- Physician Orders Details Patient Name: Mitchell [name] Date of Service: 01/28/2016 10:45 AM Medical Record Number: [id number] Patient Account Number: [id number] Date of Birth/Sex: [birth year] (81 y.o. Male) Treating RN: Cornell Barman Primary Care Physician: Tedra Senegal Other Clinician: Referring Physician: Tedra Senegal Treating Physician/Extender: Sharalyn Ink in Treatment: 4 Verbal / Phone Orders: Yes Clinician: Cornell Barman Read Back and Verified: Yes Diagnosis Coding Wound Cleansing Wound #1 Midline Back o Clean wound with Normal Saline. o May Shower, gently pat wound dry prior  to applying new dressing. Anesthetic Wound #1 Midline Back o Topical Lidocaine 4% cream applied to wound bed prior to debridement Primary Wound Dressing Wound #1 Midline Back o Aquacel Ag - aquacel ag rope - HHRN to provide this to patient Secondary Dressing Wound #1 Midline Back o XtraSorb - HHRN please provide xtrasorb to patient (or equal) - may use ABD pad OVER xtrasorb for added protection but xtrasorb will protect periwound skin from moisture o Boardered Foam Dressing - Allevyn Life or Equal to protect surrounding skin from tape damage. Dressing Change Frequency Wound #1 Midline Back o Change dressing every day. Follow-up Appointments Wound #1 Midline Back o Return Appointment in 1 week. Home Health Wound #1 Midline Back o Continue Home Health Visits - [facility] (twice weekly) o Home Health Nurse may visit PRN to address patientos wound care needs. o FACE TO FACE ENCOUNTER: MEDICARE and MEDICAID PATIENTS: I certify that this patient is under my care and that I had a face-to-face encounter that meets the physician face-to-face encounter requirements with this patient on this date. The encounter with the patient was in whole or in part for the following MEDICAL CONDITION: (primary reason for [place]) Mitchell, [name] ([id number]) MEDICAL NECESSITY: I certify, that based on my findings, NURSING services are a medically necessary home health service. HOME BOUND STATUS: I certify that my clinical findings support that this patient is homebound (i.e., Due to illness or injury, pt requires aid of supportive devices such as crutches, cane, wheelchairs, walkers, the use of special transportation or the assistance of another person to leave their place of residence. There is a normal inability to leave the home and doing so requires considerable and taxing effort. Other absences are for medical reasons / religious services and are infrequent or of short  duration when for other reasons). o If current dressing causes regression in  wound condition, may D/C ordered dressing product/s and apply Normal Saline Moist Dressing daily until next [place] / Other MD appointment. [place] of regression in wound condition at [phone]. o Please direct any NON-WOUND related issues/requests for orders to patient's Primary Care Physician Medications-please add to medication list. Wound #1 Midline Back o Other: - mycolog prescription (TCA in clinic) Patient Medications Allergies: codeine, Statins-Hmg-Coa Reductase Inhibitors, 5-Alfa Reductase Inhibitor, Azasteroids Notifications Medication Indication Start End nystatin-triamcinolone 01/28/2016 DOSE topical 100,000 unit/gram-%-0.1 unit/gram-% ointment - ointment topical apply a thin film to the periwound area daily with dressing changes. Allow to dry before applying dressing Electronic Signature(s) Signed: 01/28/2016 1:36:40 PM By: Gretta Cool RN, BSN, Kim RN, BSN Signed: 01/28/2016 4:52:13 PM By: Worthy Keeler PA-C Entered By: Gretta Cool RN, BSN, Kim on 01/28/2016 12:05:52 Mitchell, [name] ([id number]) -------------------------------------------------------------------------------- Prescription 01/28/2016 Patient Name: Mitchell [name] Physician: Worthy Keeler PA-C Date of Birth: Oct 18, 1934 NPI#: [id number] Sex: Jerilynn Mages DEA#: [id number] Phone #: [phone] License #: Patient Address: [place], [place] [address] [facility] [address] [place], [place] [address] [phone] Allergies codeine Statins-Hmg-Coa Reductase Inhibitors 5-Alfa Reductase Inhibitor, Azasteroids Medication Medication: Route: Strength: Form: nystatin-triamcinolone 100,000 topical 100,000 unit/gram-%-0.1 ointment unit/gram-0.1 % topical unit/gram-% ointment Class: TOPICAL  ANTIFUNGALS Dose: Frequency / Time: Indication: ointment topical apply a thin film to the periwound area daily with dressing changes. Allow to dry before applying dressing Number of Refills: Number of Units: 0 Thirty (30) Gram(s) Generic Substitution: Start Date: End Date: One Time Use: Substitution Permitted [id number] No Note to Pharmacy: Signature(s): Date(s): Mitchell, [name] ([id number]) Electronic Signature(s) Signed: 01/28/2016 4:52:13 PM By: Worthy Keeler PA-C Entered By: Worthy Keeler on 01/28/2016 13:13:46 Mitchell [name] ([id number]) --------------------------------------------------------------------------------  Problem List Details Patient Name: Mitchell [name] Date of Service: 01/28/2016 10:45 AM Medical Record Number: [id number] Patient Account Number: [id number] Date of Birth/Sex: [birth year] (81 y.o. 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 Male) Treating RN: Montey Hora Primary Care Physician: Tedra Senegal Other Clinician: Referring Physician: Tedra Senegal Treating Physician/Extender: Melburn Hake, Damira Kem Weeks in Treatment: 4 Subjective Chief Complaint Information obtained from Patient 01/28/16 81 year old man admitted to clinic for review of his surgical wound in his lumbar spine area History of Present Illness (HPI) 12/31/15; this is a patient to was increasingly incapacitated over the last year with severe lumbar spinal stenosis and radiculopathy at L4-L5 L5-S1 with a large disc herniation at L2-L3. He had several spinal injections over the last year with no relief in his pain. On July 7 17 he had a redo decompression with fusion at L4-L5 L5-S1 and a laminectomy and microdiscectomy of L2-L3 with pedicle screw fixation L2-S1. Is not really clear to me at the time of this  dictation as to the exact course of this wound. However he was discharged to rehabilitation at [facility] and it was very clear at the end of the stay here that there was an open area for which she had a wound VAC for a period of time. A culture on 10/29/15 showed both Pseudomonas and Klebsiella. He was discharged on ciprofloxacin and he still remains on that currently. He has home health going into his home now and doing iodoform packing. He has a follow-up with his neurosurgeon Dr. Vertell Limber tomorrow. There is still moderate amount of drainage. However the patient denies fever or chills or excessive pain. He is working hard with physical therapy in order to gain ambulatory status. He has a right foot drop for which he uses an AFO brace. As far as I can tell he has not had any advanced imaging of the low back although he apparently a has had plain x-rays in the neurosurgeon's office. No recent cultures. 01/14/16; culture from 2 weeks ago was negative. He went to sees Dr. Vertell Limber last week who is still was reluctant to proceed with any more imaging studies, they follow with him on 01-22-2023. They're using Aquacel Ag packing but  still having copious amounts of clear yellowish drainage. This is being changed once a day. He has not been systemically unwell, no pain no fever and really no complaints other than the periwound is itchy 01/28/16 at this point in time patient has had a follow-up visit with Dr. Melven Sartorius neurosurgeon who is still not recommending any further advanced imaging studies at this point in time. We have been using Aquacel Ag packing although Dr. Vertell Limber didn't want him not to pack this as tightly. This is being changed daily. He has no signs or symptoms of systemic infection and his main issue is that he has a rash and itching around the periwound region. They have been using over-the-counter antifungal cream nothing prescribed up to this point. Fortunately he is not having a significant amount of pain at this point in time and rates his discomfort to be a 1-2 out of 10 Mitchell [name], [name] ([id number]) Objective Constitutional Well-nourished and well-hydrated in no acute distress. Vitals Time Taken: 10:53 AM, Height: 75 in, Weight: 272 lbs, BMI: 34, Temperature: 97.8 F, Pulse: 126 bpm, Respiratory Rate: 18 breaths/min, Blood Pressure: 125/86 mmHg. Respiratory normal breathing without difficulty. Psychiatric this patient is able to make decisions and demonstrates good insight into disease process. Alert and Oriented x 3. pleasant and cooperative. General Notes: Foortunately patiient's wound culture came back negative for any organisms at this point in time. This is good news. Subsequently he does seem to have some periwound fungal infection likely. This wound still tracts deeply toward the cephalad region. Integumentary (Hair, Skin) Wound #1 status is Open. Original cause of wound was Surgical Injury. The wound is located on the Midline Back. The wound measures 0.9cm length x 0.5cm width x 7cm depth; 0.353cm^2 area and 2.474cm^3 volume. The wound is limited to skin breakdown. There is a large amount of  purulent drainage noted. The wound margin is flat and intact. There is large (67-100%) red granulation within the wound bed. There is no necrotic tissue within the wound bed. The periwound skin appearance exhibited: Moist. The periwound skin appearance did not exhibit: Callus, Crepitus, Excoriation, Fluctuance, Friable, Induration, Localized Edema, Rash, Scarring, Dry/Scaly, Maceration, Atrophie Blanche, Cyanosis, Ecchymosis, Hemosiderin Staining, Mottled, Pallor, Rubor, Erythema. Periwound temperature was noted as No Abnormality. Assessment Active Problems ICD-10  T81.31XD - Disruption of external operation (surgical) wound, not elsewhere classified, subsequent encounter M48.06 - Spinal stenosis, lumbar region L98.429 - Non-pressure chronic ulcer of back with unspecified severity [name], [name] ([id number]) Plan Wound Cleansing: Wound #1 Midline Back: Clean wound with Normal Saline. May Shower, gently pat wound dry prior to applying new dressing. Anesthetic: Wound #1 Midline Back: Topical Lidocaine 4% cream applied to wound bed prior to debridement Primary Wound Dressing: Wound #1 Midline Back: Aquacel Ag - aquacel ag rope - HHRN to provide this to patient Secondary Dressing: Wound #1 Midline Back: XtraSorb - HHRN please provide xtrasorb to patient (or equal) - may use ABD pad OVER xtrasorb for added protection but xtrasorb will protect periwound skin from moisture Boardered Foam Dressing - Allevyn Life or Equal to protect surrounding skin from tape damage. Dressing Change Frequency: Wound #1 Midline Back: Change dressing every day. Follow-up Appointments: Wound #1 Midline Back: Return Appointment in 1 week. Home Health: Wound #1 Midline Back: Continue Home Health Visits - [facility] (twice weekly) Home Health Nurse may visit PRN to address patient s wound care needs. FACE TO FACE ENCOUNTER: MEDICARE and MEDICAID PATIENTS: I certify that this patient is under my care and that  I had a face-to-face encounter that meets the physician face-to-face encounter requirements with this patient on this date. The encounter with the patient was in whole or in part for the following MEDICAL CONDITION: (primary reason for [place]) MEDICAL NECESSITY: I certify, that based on my findings, NURSING services are a medically necessary home health service. HOME BOUND STATUS: I certify that my clinical findings support that this patient is homebound (i.e., Due to illness or injury, pt requires aid of supportive devices such as crutches, cane, wheelchairs, walkers, the use of special transportation or the assistance of another person to leave their place of residence. There is a normal inability to leave the home and doing so requires considerable and taxing effort. Other absences are for medical reasons / religious services and are infrequent or of short duration when for other reasons). If current dressing causes regression in wound condition, may D/C ordered dressing product/s and apply Normal Saline Moist Dressing daily until next [place] / Other MD appointment. [place] of regression in wound condition at [phone]. Please direct any NON-WOUND related issues/requests for orders to patient's Primary Care Physician Medications-please add to medication list.: Wound #1 Midline Back: Other: - mycolog prescription (TCA in clinic) The following medication(s) was prescribed: nystatin-triamcinolone topical 100,000 unit/gram-%-0.1 unit/gram-% ointment ointment topical apply a thin film to the periwound area daily with dressing changes. Allow to dry before applying dressing starting 01/28/2016 Mitchell, [name]. ([id number]) Follow-Up Appointments: A follow-up appointment should be scheduled. Medication Reconciliation completed and provided to Patient/Care Provider. A Patient Clinical Summary of Care was provided to HD At this point in time under  recommend that we go ahead and proceed with continuing with the Aquacel Ag packing which I did explain I would recommend not to pack too tightly. Subsequently I also recommended that we try a different cream for him and I'm going to prescribe triamcinolone/nystatin cream (Mycolog). Hopefullythis will help to alleviate the rash were efffectively. we will see him for reevaluation in 2 weeks to see where things stand at that point in time. If anything worsens in the interim he will contact the office and let me know otherwise it sounds like at this point in time Dr. Vertell Limber is not recommending any further surgery which  the patient is okay with this as he would prefer not to have any surgery going forward as he had a rough time this time. Electronic Signature(s) Signed: 01/28/2016 4:52:13 PM By: Worthy Keeler PA-C Entered By: Worthy Keeler on 01/28/2016 13:11:26 Mitchell, [name] ([id number]) -------------------------------------------------------------------------------- SuperBill Details Patient Name: Mitchell [name] Date of Service: 01/28/2016 Medical Record Number: [id number] Patient Account Number: [id number] Date of Birth/Sex: 1935-02-13 (81 y.o. 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

## 2016-01-29 NOTE — Progress Notes (Signed)
Mitchell [name] ([id number]) Visit Report for 01/28/2016 Arrival Information Details Patient Name: Mitchell [name], Mitchell [name] Date of Service: 01/28/2016 10:45 AM Medical Record Number: [id number] Patient Account Number: [id number] Date of Birth/Sex: 1934-07-14 (81 y.o. Male) Treating RN: Cornell Barman Primary Care Physician: Tedra Senegal Other Clinician: Referring Physician: Tedra Senegal Treating Physician/Extender: Melburn Hake, HOYT Weeks in Treatment: 4 Visit Information History Since Last Visit Added or deleted any medications: No Patient Arrived: Wheel Chair Any new allergies or adverse reactions: No Arrival Time: 10:52 Had a fall or experienced change in No activities of daily living that may affect Accompanied By: wife risk of falls: Transfer Assistance: None Signs or symptoms of abuse/neglect since last No Patient Identification Verified: Yes visito Secondary Verification Process Yes Hospitalized since last visit: No Completed: Has Dressing in Place as Prescribed: Yes Pain Present Now: No Electronic Signature(s) Signed: 01/28/2016 11:44:35 AM By: Gretta Cool, RN, BSN, Kim RN, BSN Entered By: Gretta Cool, RN, BSN, Kim on 01/28/2016 10:52:51 Mitchell [name] ([id number]) -------------------------------------------------------------------------------- Clinic Level of Care Assessment Details Patient Name: Mitchell [name] Date of Service: 01/28/2016 10:45 AM Medical Record Number: [id number] Patient Account Number: [id number] Date of Birth/Sex: 1935-01-01 (81 y.o. Male) Treating RN: Cornell Barman Primary Care Physician: Tedra Senegal Other Clinician: Referring Physician: Tedra Senegal Treating Physician/Extender: Melburn Hake, HOYT Weeks in Treatment: 4 Clinic Level of Care Assessment Items TOOL 4 Quantity Score []  - Use when only an EandM is performed on FOLLOW-UP visit 0 ASSESSMENTS - Nursing Assessment / Reassessment []  - Reassessment of Co-morbidities (includes updates in patient status) 0 X -  Reassessment of Adherence to Treatment Plan 1 5 ASSESSMENTS - Wound and Skin Assessment / Reassessment X - Simple Wound Assessment / Reassessment - one wound 1 5 []  - Complex Wound Assessment / Reassessment - multiple wounds 0 []  - Dermatologic / Skin Assessment (not related to wound area) 0 ASSESSMENTS - Focused Assessment []  - Circumferential Edema Measurements - multi extremities 0 []  - Nutritional Assessment / Counseling / Intervention 0 []  - Lower Extremity Assessment (monofilament, tuning fork, pulses) 0 []  - Peripheral Arterial Disease Assessment (using hand held doppler) 0 ASSESSMENTS - Ostomy and/or Continence Assessment and Care []  - Incontinence Assessment and Management 0 []  - Ostomy Care Assessment and Management (repouching, etc.) 0 PROCESS - Coordination of Care X - Simple Patient / Family Education for ongoing care 1 15 []  - Complex (extensive) Patient / Family Education for ongoing care 0 X - Staff obtains Programmer, systems, Records, Test Results / Process Orders 1 10 []  - Staff telephones HHA, Nursing Homes / Clarify orders / etc 0 []  - Routine Transfer to another Facility (non-emergent condition) 0 [place], [place]. ([id number]) []  - Routine Hospital Admission (non-emergent condition) 0 []  - New Admissions / Biomedical engineer / Ordering NPWT, Apligraf, etc. 0 []  - Emergency Hospital Admission (emergent condition) 0 X - Simple Discharge Coordination 1 10 []  - Complex (extensive) Discharge Coordination 0 PROCESS - Special Needs []  - Pediatric / Minor Patient Management 0 []  - Isolation Patient Management 0 []  - Hearing / Language / Visual special needs 0 []  - Assessment of Community assistance (transportation, D/C planning, etc.) 0 []  - Additional assistance / Altered mentation 0 []  - Support Surface(s) Assessment (bed, cushion, seat, etc.) 0 INTERVENTIONS - Wound Cleansing / Measurement X - Simple Wound Cleansing - one wound 1 5 []  - Complex Wound Cleansing - multiple  wounds 0 X - Wound Imaging (photographs - any number of wounds) 1 5 []  - Wound  Tracing (instead of photographs) 0 X - Simple Wound Measurement - one wound 1 5 []  - Complex Wound Measurement - multiple wounds 0 INTERVENTIONS - Wound Dressings X - Small Wound Dressing one or multiple wounds 1 10 []  - Medium Wound Dressing one or multiple wounds 0 []  - Large Wound Dressing one or multiple wounds 0 X - Application of Medications - topical 1 5 []  - Application of Medications - injection 0 INTERVENTIONS - Miscellaneous []  - External ear exam 0 Lyness, Rodrigus W. ([id number]) []  - Specimen Collection (cultures, biopsies, blood, body fluids, etc.) 0 []  - Specimen(s) / Culture(s) sent or taken to Lab for analysis 0 []  - Patient Transfer (multiple staff / Harrel Lemon Lift / Similar devices) 0 []  - Simple Staple / Suture removal (25 or less) 0 []  - Complex Staple / Suture removal (26 or more) 0 []  - Hypo / Hyperglycemic Management (close monitor of Blood Glucose) 0 []  - Ankle / Brachial Index (ABI) - do not check if billed separately 0 X - Vital Signs 1 5 Has the patient been seen at the hospital within the last three years: Yes Total Score: 80 Level Of Care: New/Established - Level 3 Electronic Signature(s) Signed: 01/28/2016 11:44:35 AM By: Gretta Cool, RN, BSN, Kim RN, BSN Entered By: Gretta Cool, RN, BSN, Kim on 01/28/2016 11:31:43 Mitchell [name] ([id number]) -------------------------------------------------------------------------------- Encounter Discharge Information Details Patient Name: Mitchell [name] Date of Service: 01/28/2016 10:45 AM Medical Record Number: [id number] Patient Account Number: [id number] Date of Birth/Sex: [birth year] (81 y.o. Male) Treating RN: Montey Hora Primary Care Physician: Tedra Senegal Other Clinician: Referring Physician: Tedra Senegal Treating Physician/Extender: Melburn Hake, HOYT Weeks in Treatment: 4 Encounter Discharge Information Items Discharge Pain Level:  0 Discharge Condition: Stable Ambulatory Status: Wheelchair Discharge Destination: Home Transportation: Private Auto Accompanied By: wife Schedule Follow-up Appointment: Yes Medication Reconciliation completed and provided to Patient/Care Yes Karelyn Brisby: Provided on Clinical Summary of Care: 01/28/2016 Form Type Recipient Paper Patient HD Electronic Signature(s) Signed: 01/28/2016 11:44:35 AM By: Gretta Cool, RN, BSN, Kim RN, BSN Previous Signature: 01/28/2016 11:30:56 AM Version By: Ruthine Dose Entered By: Gretta Cool RN, BSN, Kim on 01/28/2016 11:33:17 MUNEER, GURGANIOUS ([id number]) -------------------------------------------------------------------------------- Multi Wound Chart Details Patient Name: Mitchell [name] Date of Service: 01/28/2016 10:45 AM Medical Record Number: [id number] Patient Account Number: [id number] Date of Birth/Sex: 1934/12/04 (81 y.o. Male) Treating RN: Cornell Barman Primary Care Physician: Tedra Senegal Other Clinician: Referring Physician: Tedra Senegal Treating Physician/Extender: Melburn Hake, HOYT Weeks in Treatment: 4 Vital Signs Height(in): 75 Pulse(bpm): 126 Weight(lbs): 272 Blood Pressure 125/86 (mmHg): Body Mass Index(BMI): 34 Temperature(F): 97.8 Respiratory Rate 18 (breaths/min): Photos: [N/A:N/A] Wound Location: Back - Midline N/A N/A Wounding Event: Surgical Injury N/A N/A Primary Etiology: Open Surgical Wound N/A N/A Comorbid History: Cataracts, Asthma, N/A N/A Arrhythmia, Hypertension, Osteoarthritis Date Acquired: 10/24/2015 N/A N/A Weeks of Treatment: 4 N/A N/A Wound Status: Open N/A N/A Measurements L x W x D 0.9x0.5x7 N/A N/A (cm) Area (cm) : 0.353 N/A N/A Volume (cm) : 2.474 N/A N/A % Reduction in Area: -2.00% N/A N/A % Reduction in Volume: 5.80% N/A N/A Classification: Full Thickness Without N/A N/A Exposed Support Structures Exudate Amount: Large N/A N/A Exudate Type: Purulent N/A N/A Exudate Color: yellow, brown, green  N/A N/A Wound Margin: Flat and Intact N/A N/A Granulation Amount: Large (67-100%) N/A N/A Jackson, Unnamed W. ([id number]) Granulation Quality: Red N/A N/A Necrotic Amount: None Present (0%) N/A N/A Exposed Structures: Fascia: No N/A N/A Fat: No Tendon: No Muscle: No Joint:  No Bone: No Limited to Skin Breakdown Epithelialization: None N/A N/A Periwound Skin Texture: Edema: No N/A N/A Excoriation: No Induration: No Callus: No Crepitus: No Fluctuance: No Friable: No Rash: No Scarring: No Periwound Skin Moist: Yes N/A N/A Moisture: Maceration: No Dry/Scaly: No Periwound Skin Color: Atrophie Blanche: No N/A N/A Cyanosis: No Ecchymosis: No Erythema: No Hemosiderin Staining: No Mottled: No Pallor: No Rubor: No Temperature: No Abnormality N/A N/A Tenderness on No N/A N/A Palpation: Wound Preparation: Ulcer Cleansing: N/A N/A Rinsed/Irrigated with Saline Topical Anesthetic Applied: None Treatment Notes Electronic Signature(s) Signed: 01/28/2016 11:44:35 AM By: Gretta Cool, RN, BSN, Kim RN, BSN Entered By: Gretta Cool, RN, BSN, Kim on 01/28/2016 10:59:43 Mitchell [name] ([id number]) -------------------------------------------------------------------------------- [place] Details Patient Name: Mitchell [name] Date of Service: 01/28/2016 10:45 AM Medical Record Number: [id number] Patient Account Number: [id number] Date of Birth/Sex: December 07, 1934 (81 y.o. Male) Treating RN: Cornell Barman Primary Care Physician: Tedra Senegal Other Clinician: Referring Physician: Tedra Senegal Treating Physician/Extender: Melburn Hake, HOYT Weeks in Treatment: 4 Active Inactive Abuse / Safety / Falls / Self Care Management Nursing Diagnoses: Impaired physical mobility Potential for falls Goals: Patient will remain injury free Date Initiated: 12/31/2015 Goal Status: Active Interventions: Assess fall risk on admission and as needed Notes: Orientation to the Wound Care  Program Nursing Diagnoses: Knowledge deficit related to the wound healing center program Goals: Patient/caregiver will verbalize understanding of the [facility] Program Date Initiated: 12/31/2015 Goal Status: Active Interventions: Provide education on orientation to the wound center Notes: Wound/Skin Impairment Nursing Diagnoses: Impaired tissue integrity Goals: Patient/caregiver will verbalize understanding of skin care regimen ROCKY, VITALI ([id number]) Date Initiated: 12/31/2015 Goal Status: Active Ulcer/skin breakdown will have a volume reduction of 30% by week 4 Date Initiated: 12/31/2015 Goal Status: Active Ulcer/skin breakdown will have a volume reduction of 50% by week 8 Date Initiated: 12/31/2015 Goal Status: Active Ulcer/skin breakdown will have a volume reduction of 80% by week 12 Date Initiated: 12/31/2015 Goal Status: Active Ulcer/skin breakdown will heal within 14 weeks Date Initiated: 12/31/2015 Goal Status: Active Interventions: Assess patient/caregiver ability to obtain necessary supplies Assess patient/caregiver ability to perform ulcer/skin care regimen upon admission and as needed Assess ulceration(s) every visit Notes: Electronic Signature(s) Signed: 01/28/2016 11:44:35 AM By: Gretta Cool, RN, BSN, Kim RN, BSN Entered By: Gretta Cool, RN, BSN, Kim on 01/28/2016 10:59:36 Mitchell [name] ([id number]) -------------------------------------------------------------------------------- Pain Assessment Details Patient Name: Mitchell [name] Date of Service: 01/28/2016 10:45 AM Medical Record Number: [id number] Patient Account Number: [id number] Date of Birth/Sex: 12/12/1934 (81 y.o. Male) Treating RN: Cornell Barman Primary Care Physician: Tedra Senegal Other Clinician: Referring Physician: Tedra Senegal Treating Physician/Extender: Melburn Hake, HOYT Weeks in Treatment: 4 Active Problems Location of Pain Severity and Description of Pain Patient Has Paino No Site  Locations With Dressing Change: No Pain Management and Medication Current Pain Management: Electronic Signature(s) Signed: 01/28/2016 11:44:35 AM By: Gretta Cool, RN, BSN, Kim RN, BSN Entered By: Gretta Cool, RN, BSN, Kim on 01/28/2016 10:52:57 Mitchell [name] ([id number]) -------------------------------------------------------------------------------- Patient/Caregiver Education Details Patient Name: Mitchell [name] Date of Service: 01/28/2016 10:45 AM Medical Record Number: [id number] Patient Account Number: [id number] Date of Birth/Gender: 12-22-1934 (81 y.o. Male) Treating RN: Cornell Barman Primary Care Physician: Tedra Senegal Other Clinician: Referring Physician: Tedra Senegal Treating Physician/Extender: Sharalyn Ink in Treatment: 4 Education Assessment Education Provided To: Patient Education Topics Provided Wound/Skin Impairment: Handouts: Caring for Your Ulcer, Other: continiue wound care as presc ribed Methods: Demonstration Responses: State content correctly Electronic Signature(s) Signed:  01/28/2016 11:44:35 AM By: Gretta Cool, RN, BSN, Kim RN, BSN Entered By: Gretta Cool, RN, BSN, Kim on 01/28/2016 11:33:42 RODRIGUS, NAFUS ([id number]) -------------------------------------------------------------------------------- Wound Assessment Details Patient Name: Mitchell [name] Date of Service: 01/28/2016 10:45 AM Medical Record Number: [id number] Patient Account Number: [id number] Date of Birth/Sex: [birth year] (81 y.o. Male) Treating RN: Cornell Barman Primary Care Physician: Tedra Senegal Other Clinician: Referring Physician: Tedra Senegal Treating Physician/Extender: Melburn Hake, HOYT Weeks in Treatment: 4 Wound Status Wound Number: 1 Primary Open Surgical Wound Etiology: Wound Location: Back - Midline Wound Open Wounding Event: Surgical Injury Status: Date Acquired: 10/24/2015 Comorbid Cataracts, Asthma, Arrhythmia, Weeks Of Treatment: 4 History: Hypertension,  Osteoarthritis Clustered Wound: No Photos Wound Measurements Length: (cm) 0.9 Width: (cm) 0.5 Depth: (cm) 7 Area: (cm) 0.353 Volume: (cm) 2.474 % Reduction in Area: -2% % Reduction in Volume: 5.8% Epithelialization: None Wound Description Full Thickness Without Exposed Classification: Support Structures Wound Margin: Flat and Intact Exudate Large Amount: Exudate Type: Purulent Exudate Color: yellow, brown, green Foul Odor After Cleansing: No Wound Bed Granulation Amount: Large (67-100%) Exposed Structure Granulation Quality: Red Fascia Exposed: No Necrotic Amount: None Present (0%) Fat Layer Exposed: No Dillow, Godwin W. ([id number]) Tendon Exposed: No Muscle Exposed: No Joint Exposed: No Bone Exposed: No Limited to Skin Breakdown Periwound Skin Texture Texture Color No Abnormalities Noted: No No Abnormalities Noted: No Callus: No Atrophie Blanche: No Crepitus: No Cyanosis: No Excoriation: No Ecchymosis: No Fluctuance: No Erythema: No Friable: No Hemosiderin Staining: No Induration: No Mottled: No Localized Edema: No Pallor: No Rash: No Rubor: No Scarring: No Temperature / Pain Moisture Temperature: No Abnormality No Abnormalities Noted: No Dry / Scaly: No Maceration: No Moist: Yes Wound Preparation Ulcer Cleansing: Rinsed/Irrigated with Saline Topical Anesthetic Applied: None Treatment Notes Wound #1 (Midline Back) 1. Cleansed with: Clean wound with Normal Saline 2. Anesthetic Topical Lidocaine 4% cream to wound bed prior to debridement 4. Dressing Applied: Aquacel Ag 5. Secondary Dressing Applied Bordered Foam Dressing Notes [employer]tex Electronic Signature(s) Signed: 01/28/2016 11:44:35 AM By: Gretta Cool, RN, BSN, Kim RN, BSN Entered By: Gretta Cool, RN, BSN, Kim on 01/28/2016 10:58:42 LATRICE, KAISER ([id number]) -------------------------------------------------------------------------------- Vitals Details Patient Name: Mitchell [name] Date of Service: 01/28/2016 10:45 AM Medical Record Number: [id number] Patient Account Number: [id number] Date of Birth/Sex: [birth year] (81 y.o. Male) Treating RN: Cornell Barman Primary Care Physician: Tedra Senegal Other Clinician: Referring Physician: Tedra Senegal Treating Physician/Extender: Melburn Hake, HOYT Weeks in Treatment: 4 Vital Signs Time Taken: 10:53 Temperature (F): 97.8 Height (in): 75 Pulse (bpm): 126 Weight (lbs): 272 Respiratory Rate (breaths/min): 18 Body Mass Index (BMI): 34 Blood Pressure (mmHg): 125/86 Reference Range: 80 - 120 mg / dl Electronic Signature(s) Signed: 01/28/2016 11:44:35 AM By: Gretta Cool, RN, BSN, Kim RN, BSN Entered By: Gretta Cool, RN, BSN, Kim on 01/28/2016 10:53:33

## 2016-02-02 ENCOUNTER — Encounter: Payer: Self-pay | Admitting: Internal Medicine

## 2016-02-02 ENCOUNTER — Ambulatory Visit (INDEPENDENT_AMBULATORY_CARE_PROVIDER_SITE_OTHER): Payer: Medicare Other | Admitting: Internal Medicine

## 2016-02-02 ENCOUNTER — Ambulatory Visit
Admission: RE | Admit: 2016-02-02 | Discharge: 2016-02-02 | Disposition: A | Discharge status: Home / Self Care | Payer: Medicare Other | Source: Ambulatory Visit | Attending: Internal Medicine | Admitting: Internal Medicine

## 2016-02-02 VITALS — BP 142/88 | HR 123 | Temp 97.9°F

## 2016-02-02 DIAGNOSIS — I509 Heart failure, unspecified: Secondary | ICD-10-CM | POA: Diagnosis not present

## 2016-02-02 DIAGNOSIS — B965 Pseudomonas (aeruginosa) (mallei) (pseudomallei) as the cause of diseases classified elsewhere: Secondary | ICD-10-CM | POA: Diagnosis not present

## 2016-02-02 DIAGNOSIS — J069 Acute upper respiratory infection, unspecified: Secondary | ICD-10-CM | POA: Diagnosis not present

## 2016-02-02 DIAGNOSIS — B961 Klebsiella pneumoniae [K. pneumoniae] as the cause of diseases classified elsewhere: Secondary | ICD-10-CM | POA: Diagnosis not present

## 2016-02-02 DIAGNOSIS — R9389 Abnormal findings on diagnostic imaging of other specified body structures: Secondary | ICD-10-CM

## 2016-02-02 DIAGNOSIS — R938 Abnormal findings on diagnostic imaging of other specified body structures: Secondary | ICD-10-CM | POA: Diagnosis not present

## 2016-02-02 DIAGNOSIS — T814XXD Infection following a procedure, subsequent encounter: Secondary | ICD-10-CM | POA: Diagnosis not present

## 2016-02-02 DIAGNOSIS — J9801 Acute bronchospasm: Secondary | ICD-10-CM

## 2016-02-02 DIAGNOSIS — J9811 Atelectasis: Secondary | ICD-10-CM | POA: Diagnosis not present

## 2016-02-02 DIAGNOSIS — M5116 Intervertebral disc disorders with radiculopathy, lumbar region: Secondary | ICD-10-CM | POA: Diagnosis not present

## 2016-02-02 DIAGNOSIS — M4316 Spondylolisthesis, lumbar region: Secondary | ICD-10-CM | POA: Diagnosis not present

## 2016-02-02 DIAGNOSIS — M21371 Foot drop, right foot: Secondary | ICD-10-CM | POA: Diagnosis not present

## 2016-02-02 DIAGNOSIS — I2510 Atherosclerotic heart disease of native coronary artery without angina pectoris: Secondary | ICD-10-CM | POA: Diagnosis not present

## 2016-02-02 MED ORDER — BUDESONIDE-FORMOTEROL FUMARATE 160-4.5 MCG/ACT IN AERO: 2.0000 | INHALATION_SPRAY | Freq: Two times a day (BID) | RESPIRATORY_TRACT | 3 refills | Status: DC

## 2016-02-02 NOTE — Progress Notes (Signed)
   Subjective:    Patient ID: Mitchell [name], male    DOB: Sep 03, 1934, 81 y.o.   MRN: [id number]  HPI In today to follow-up on respiratory infection and mild pulmonary edema. He was restarted on Lasix. He currently is taking it just every other day. It causes urinary urgency. Basic metabolic panel will be drawn today. Respiratory infection symptoms have improved considerably. He is now on Symbicort 160 as a maintenance inhaler and when necessary albuterol.    Review of Systems     Objective:   Physical Exam  Chest is clear to auscultation without rales or wheezing. Cardiac exam regular rate and rhythm.      Assessment & Plan:  Protracted respiratory infection  Mild pulmonary edema improved with Lasix  Plan: Refill Zithromax Z-PAK take as directed. Continue albuterol inhaler on when necessary basis and Symbicort on a daily basis. Follow-up in 2-3 weeks.

## 2016-02-03 DIAGNOSIS — T8131XD Disruption of external operation (surgical) wound, not elsewhere classified, subsequent encounter: Secondary | ICD-10-CM | POA: Diagnosis not present

## 2016-02-03 DIAGNOSIS — M4316 Spondylolisthesis, lumbar region: Secondary | ICD-10-CM | POA: Diagnosis not present

## 2016-02-03 DIAGNOSIS — M5116 Intervertebral disc disorders with radiculopathy, lumbar region: Secondary | ICD-10-CM | POA: Diagnosis not present

## 2016-02-03 DIAGNOSIS — M48061 Spinal stenosis, lumbar region without neurogenic claudication: Secondary | ICD-10-CM | POA: Diagnosis not present

## 2016-02-03 DIAGNOSIS — B965 Pseudomonas (aeruginosa) (mallei) (pseudomallei) as the cause of diseases classified elsewhere: Secondary | ICD-10-CM | POA: Diagnosis not present

## 2016-02-03 DIAGNOSIS — L98429 Non-pressure chronic ulcer of back with unspecified severity: Secondary | ICD-10-CM | POA: Diagnosis not present

## 2016-02-03 DIAGNOSIS — B961 Klebsiella pneumoniae [K. pneumoniae] as the cause of diseases classified elsewhere: Secondary | ICD-10-CM | POA: Diagnosis not present

## 2016-02-03 DIAGNOSIS — T814XXD Infection following a procedure, subsequent encounter: Secondary | ICD-10-CM | POA: Diagnosis not present

## 2016-02-03 DIAGNOSIS — M21371 Foot drop, right foot: Secondary | ICD-10-CM | POA: Diagnosis not present

## 2016-02-03 LAB — BASIC METABOLIC PANEL WITH GFR
BUN: 16 mg/dL (ref 7–25)
CO2: 26 mmol/L (ref 20–31)
Calcium: 8.9 mg/dL (ref 8.6–10.3)
Chloride: 103 mmol/L (ref 98–110)
Creat: 0.66 mg/dL — ABNORMAL LOW (ref 0.70–1.18)
Glucose, Bld: 96 mg/dL (ref 65–99)
Potassium: 4.9 mmol/L (ref 3.5–5.3)
Sodium: 139 mmol/L (ref 135–146)

## 2016-02-03 MED ORDER — AZITHROMYCIN 250 MG PO TABS: ORAL_TABLET | ORAL | 0 refills | Status: DC

## 2016-02-04 ENCOUNTER — Encounter: Payer: Medicare Other | Admitting: Internal Medicine

## 2016-02-04 DIAGNOSIS — M1990 Unspecified osteoarthritis, unspecified site: Secondary | ICD-10-CM | POA: Diagnosis not present

## 2016-02-04 DIAGNOSIS — B961 Klebsiella pneumoniae [K. pneumoniae] as the cause of diseases classified elsewhere: Secondary | ICD-10-CM | POA: Diagnosis not present

## 2016-02-04 DIAGNOSIS — M4316 Spondylolisthesis, lumbar region: Secondary | ICD-10-CM | POA: Diagnosis not present

## 2016-02-04 DIAGNOSIS — L98429 Non-pressure chronic ulcer of back with unspecified severity: Secondary | ICD-10-CM | POA: Diagnosis not present

## 2016-02-04 DIAGNOSIS — T8131XD Disruption of external operation (surgical) wound, not elsewhere classified, subsequent encounter: Secondary | ICD-10-CM | POA: Diagnosis not present

## 2016-02-04 DIAGNOSIS — B965 Pseudomonas (aeruginosa) (mallei) (pseudomallei) as the cause of diseases classified elsewhere: Secondary | ICD-10-CM | POA: Diagnosis not present

## 2016-02-04 DIAGNOSIS — J45909 Unspecified asthma, uncomplicated: Secondary | ICD-10-CM | POA: Diagnosis not present

## 2016-02-04 DIAGNOSIS — M21371 Foot drop, right foot: Secondary | ICD-10-CM | POA: Diagnosis not present

## 2016-02-04 DIAGNOSIS — T8131XA Disruption of external operation (surgical) wound, not elsewhere classified, initial encounter: Secondary | ICD-10-CM | POA: Diagnosis not present

## 2016-02-04 DIAGNOSIS — M5116 Intervertebral disc disorders with radiculopathy, lumbar region: Secondary | ICD-10-CM | POA: Diagnosis not present

## 2016-02-04 DIAGNOSIS — I10 Essential (primary) hypertension: Secondary | ICD-10-CM | POA: Diagnosis not present

## 2016-02-04 DIAGNOSIS — M48061 Spinal stenosis, lumbar region without neurogenic claudication: Secondary | ICD-10-CM | POA: Diagnosis not present

## 2016-02-04 DIAGNOSIS — T814XXD Infection following a procedure, subsequent encounter: Secondary | ICD-10-CM | POA: Diagnosis not present

## 2016-02-05 DIAGNOSIS — T814XXD Infection following a procedure, subsequent encounter: Secondary | ICD-10-CM | POA: Diagnosis not present

## 2016-02-05 DIAGNOSIS — M5116 Intervertebral disc disorders with radiculopathy, lumbar region: Secondary | ICD-10-CM | POA: Diagnosis not present

## 2016-02-05 DIAGNOSIS — M4316 Spondylolisthesis, lumbar region: Secondary | ICD-10-CM | POA: Diagnosis not present

## 2016-02-05 DIAGNOSIS — B965 Pseudomonas (aeruginosa) (mallei) (pseudomallei) as the cause of diseases classified elsewhere: Secondary | ICD-10-CM | POA: Diagnosis not present

## 2016-02-05 DIAGNOSIS — B961 Klebsiella pneumoniae [K. pneumoniae] as the cause of diseases classified elsewhere: Secondary | ICD-10-CM | POA: Diagnosis not present

## 2016-02-05 DIAGNOSIS — M21371 Foot drop, right foot: Secondary | ICD-10-CM | POA: Diagnosis not present

## 2016-02-05 NOTE — Progress Notes (Signed)
MANDELL, LEEB ([id number]) Visit Report for 02/04/2016 Arrival Information Details Patient Name: Mitchell [name], Mitchell [name] Date of Service: 02/04/2016 10:00 AM Medical Record Number: [id number] Patient Account Number: [id number] Date of Birth/Sex: Feb 25, 1935 (81 y.o. Male) Treating RN: Cornell Barman Primary Care Physician: Tedra Senegal Other Clinician: Referring Physician: Tedra Senegal Treating Physician/Extender: Tito Dine in Treatment: 5 Visit Information History Since Last Visit Added or deleted any medications: No Patient Arrived: Wheel Chair Any new allergies or adverse reactions: No Arrival Time: 10:10 Had a fall or experienced change in No activities of daily living that may affect Accompanied By: wife risk of falls: Transfer Assistance: None Signs or symptoms of abuse/neglect since last No Patient Identification Verified: Yes visito Secondary Verification Process Yes Hospitalized since last visit: No Completed: Has Dressing in Place as Prescribed: Yes Patient Requires Transmission-Based No Pain Present Now: No Precautions: Patient Has Alerts: No Electronic Signature(s) Signed: 02/04/2016 11:58:06 AM By: Gretta Cool, RN, BSN, Kim RN, BSN Entered By: Gretta Cool, RN, BSN, Kim on 02/04/2016 10:11:47 Mitchell [name] ([id number]) -------------------------------------------------------------------------------- Clinic Level of Care Assessment Details Patient Name: Mitchell [name] Date of Service: 02/04/2016 10:00 AM Medical Record Number: [id number] Patient Account Number: [id number] Date of Birth/Sex: Sep 22, 1934 (81 y.o. Male) Treating RN: Cornell Barman Primary Care Physician: Tedra Senegal Other Clinician: Referring Physician: Tedra Senegal Treating Physician/Extender: Tito Dine in Treatment: 5 Clinic Level of Care Assessment Items TOOL 4 Quantity Score []  - Use when only an EandM is performed on FOLLOW-UP visit 0 ASSESSMENTS - Nursing Assessment /  Reassessment []  - Reassessment of Co-morbidities (includes updates in patient status) 0 X - Reassessment of Adherence to Treatment Plan 1 5 ASSESSMENTS - Wound and Skin Assessment / Reassessment X - Simple Wound Assessment / Reassessment - one wound 1 5 []  - Complex Wound Assessment / Reassessment - multiple wounds 0 []  - Dermatologic / Skin Assessment (not related to wound area) 0 ASSESSMENTS - Focused Assessment []  - Circumferential Edema Measurements - multi extremities 0 []  - Nutritional Assessment / Counseling / Intervention 0 []  - Lower Extremity Assessment (monofilament, tuning fork, pulses) 0 []  - Peripheral Arterial Disease Assessment (using hand held doppler) 0 ASSESSMENTS - Ostomy and/or Continence Assessment and Care []  - Incontinence Assessment and Management 0 []  - Ostomy Care Assessment and Management (repouching, etc.) 0 PROCESS - Coordination of Care X - Simple Patient / Family Education for ongoing care 1 15 []  - Complex (extensive) Patient / Family Education for ongoing care 0 X - Staff obtains Programmer, systems, Records, Test Results / Process Orders 1 10 []  - Staff telephones HHA, Nursing Homes / Clarify orders / etc 0 []  - Routine Transfer to another Facility (non-emergent condition) 0 [place], [place]. ([id number]) []  - Routine Hospital Admission (non-emergent condition) 0 []  - New Admissions / Biomedical engineer / Ordering NPWT, Apligraf, etc. 0 []  - Emergency Hospital Admission (emergent condition) 0 X - Simple Discharge Coordination 1 10 []  - Complex (extensive) Discharge Coordination 0 PROCESS - Special Needs []  - Pediatric / Minor Patient Management 0 []  - Isolation Patient Management 0 []  - Hearing / Language / Visual special needs 0 []  - Assessment of Community assistance (transportation, D/C planning, etc.) 0 []  - Additional assistance / Altered mentation 0 []  - Support Surface(s) Assessment (bed, cushion, seat, etc.) 0 INTERVENTIONS - Wound Cleansing /  Measurement X - Simple Wound Cleansing - one wound 1 5 []  - Complex Wound Cleansing - multiple wounds 0 X - Wound Imaging (photographs -  any number of wounds) 1 5 []  - Wound Tracing (instead of photographs) 0 X - Simple Wound Measurement - one wound 1 5 []  - Complex Wound Measurement - multiple wounds 0 INTERVENTIONS - Wound Dressings []  - Small Wound Dressing one or multiple wounds 0 X - Medium Wound Dressing one or multiple wounds 1 15 []  - Large Wound Dressing one or multiple wounds 0 []  - Application of Medications - topical 0 []  - Application of Medications - injection 0 INTERVENTIONS - Miscellaneous []  - External ear exam 0 Mitchell [name], [name] ([id number]) []  - Specimen Collection (cultures, biopsies, blood, body fluids, etc.) 0 []  - Specimen(s) / Culture(s) sent or taken to Lab for analysis 0 []  - Patient Transfer (multiple staff / Harrel Lemon Lift / Similar devices) 0 []  - Simple Staple / Suture removal (25 or less) 0 []  - Complex Staple / Suture removal (26 or more) 0 []  - Hypo / Hyperglycemic Management (close monitor of Blood Glucose) 0 []  - Ankle / Brachial Index (ABI) - do not check if billed separately 0 X - Vital Signs 1 5 Has the patient been seen at the hospital within the last three years: Yes Total Score: 80 Level Of Care: New/Established - Level 3 Electronic Signature(s) Signed: 02/04/2016 11:58:06 AM By: Gretta Cool, RN, BSN, Kim RN, BSN Entered By: Gretta Cool, RN, BSN, Kim on 02/04/2016 10:53:18 Mitchell [name] ([id number]) -------------------------------------------------------------------------------- Encounter Discharge Information Details Patient Name: Mitchell [name] Date of Service: 02/04/2016 10:00 AM Medical Record Number: [id number] Patient Account Number: [id number] Date of Birth/Sex: 03/24/1935 (81 y.o. Male) Treating RN: Cornell Barman Primary Care Physician: Tedra Senegal Other Clinician: Referring Physician: Tedra Senegal Treating Physician/Extender: Tito Dine in Treatment: 5 Encounter Discharge Information Items Discharge Pain Level: 0 Discharge Condition: Stable Ambulatory Status: Wheelchair Discharge Destination: Home Transportation: Private Auto Accompanied By: wife Schedule Follow-up Appointment: Yes Medication Reconciliation completed and provided to Patient/Care Yes Glennis Borger: Provided on Clinical Summary of Care: 02/04/2016 Form Type Recipient Paper Patient HD Electronic Signature(s) Signed: 02/04/2016 10:57:04 AM By: Ruthine Dose Entered By: Ruthine Dose on 02/04/2016 10:57:03 Mitchell [name] ([id number]) -------------------------------------------------------------------------------- General Visit Notes Details Patient Name: Mitchell [name] Date of Service: 02/04/2016 10:00 AM Medical Record Number: [id number] Patient Account Number: [id number] Date of Birth/Sex: 1934-05-23 (81 y.o. Male) Treating RN: Cornell Barman Primary Care Physician: Tedra Senegal Other Clinician: Referring Physician: Tedra Senegal Treating Physician/Extender: Ricard Dillon Weeks in Treatment: 5 Notes Patient came in today with appeared to be a liquid-lock type dressing that had been cut and placed on the wound. The absorptive crystals that should remain in an intact dressing were all over the patients back surrounding the wound. Electronic Signature(s) Signed: 02/04/2016 11:58:06 AM By: Gretta Cool, RN, BSN, Kim RN, BSN Entered By: Gretta Cool, RN, BSN, Kim on 02/04/2016 Mitchell [name], Mitchell [name] ([id number]) -------------------------------------------------------------------------------- Multi Wound Chart Details Patient Name: Mitchell [name] Date of Service: 02/04/2016 10:00 AM Medical Record Number: [id number] Patient Account Number: [id number] Date of Birth/Sex: 1934-06-07 (81 y.o. Male) Treating RN: Cornell Barman Primary Care Physician: Tedra Senegal Other Clinician: Referring Physician: Tedra Senegal Treating Physician/Extender:  Tito Dine in Treatment: 5 Vital Signs Height(in): 75 Pulse(bpm): 122 Weight(lbs): 272 Blood Pressure 120/82 (mmHg): Body Mass Index(BMI): 34 Temperature(F): 97.5 Respiratory Rate 16 (breaths/min): Photos: [1:No Photos] [N/A:N/A] Wound Location: [1:Back - Midline] [N/A:N/A] Wounding Event: [1:Surgical Injury] [N/A:N/A] Primary Etiology: [1:Open Surgical Wound] [N/A:N/A] Comorbid History: [1:Cataracts, Asthma, Arrhythmia, Hypertension, Osteoarthritis] [N/A:N/A] Date Acquired: [1:10/24/2015] [N/A:N/A] Weeks of Treatment: [  1:5] [N/A:N/A] Wound Status: [1:Open] [N/A:N/A] Measurements L x W x D 1x0.3x6.3 [N/A:N/A] (cm) Area (cm) : [1:0.236] [N/A:N/A] Volume (cm) : [1:1.484] [N/A:N/A] % Reduction in Area: [1:31.80%] [N/A:N/A] % Reduction in Volume: 43.50% [N/A:N/A] Classification: [1:Full Thickness Without Exposed Support Structures] [N/A:N/A] Exudate Amount: [1:Large] [N/A:N/A] Exudate Type: [1:Purulent] [N/A:N/A] Exudate Color: [1:yellow, brown, green] [N/A:N/A] Wound Margin: [1:Flat and Intact] [N/A:N/A] Granulation Amount: [1:Large (67-100%)] [N/A:N/A] Granulation Quality: [1:Red] [N/A:N/A] Necrotic Amount: [1:None Present (0%)] [N/A:N/A] Exposed Structures: [1:Fascia: No Fat: No Tendon: No Muscle: No] [N/A:N/A] Joint: No Bone: No Limited to Skin Breakdown Epithelialization: None N/A N/A Periwound Skin Texture: Excoriation: Yes N/A N/A Edema: No Induration: No Callus: No Crepitus: No Fluctuance: No Friable: No Rash: No Scarring: No Periwound Skin Moist: Yes N/A N/A Moisture: Maceration: No Dry/Scaly: No Periwound Skin Color: Ecchymosis: Yes N/A N/A Atrophie Blanche: No Cyanosis: No Erythema: No Hemosiderin Staining: No Mottled: No Pallor: No Rubor: No Temperature: No Abnormality N/A N/A Tenderness on No N/A N/A Palpation: Wound Preparation: Ulcer Cleansing: N/A N/A Rinsed/Irrigated with Saline Topical Anesthetic Applied:  None Assessment Notes: Reddened rash-like area N/A N/A surrounding the wound area. There are also a couple of red areas that appear they were from tape irritation. Treatment Notes Electronic Signature(s) Signed: 02/04/2016 11:58:06 AM By: Gretta Cool, RN, BSN, Kim RN, BSN Entered By: Gretta Cool, RN, BSN, Kim on 02/04/2016 10:26:40 Mitchell [name], Mitchell [name] ([id number]) -------------------------------------------------------------------------------- [place] Details Patient Name: Mitchell [name] Date of Service: 02/04/2016 10:00 AM Medical Record Number: [id number] Patient Account Number: [id number] Date of Birth/Sex: 30-Oct-1934 (81 y.o. Male) Treating RN: Cornell Barman Primary Care Physician: Tedra Senegal Other Clinician: Referring Physician: Tedra Senegal Treating Physician/Extender: Tito Dine in Treatment: 5 Active Inactive Abuse / Safety / Falls / Self Care Management Nursing Diagnoses: Impaired physical mobility Potential for falls Goals: Patient will remain injury free Date Initiated: 12/31/2015 Goal Status: Active Interventions: Assess fall risk on admission and as needed Notes: Orientation to the Wound Care Program Nursing Diagnoses: Knowledge deficit related to the wound healing center program Goals: Patient/caregiver will verbalize understanding of the [place] Program Date Initiated: 12/31/2015 Goal Status: Active Interventions: Provide education on orientation to the wound center Notes: Wound/Skin Impairment Nursing Diagnoses: Impaired tissue integrity Goals: Patient/caregiver will verbalize understanding of skin care regimen Mitchell [name], Mitchell [name] ([id number]) Date Initiated: 12/31/2015 Goal Status: Active Ulcer/skin breakdown will have a volume reduction of 30% by week 4 Date Initiated: 12/31/2015 Goal Status: Active Ulcer/skin breakdown will have a volume reduction of 50% by week 8 Date Initiated: 12/31/2015 Goal Status:  Active Ulcer/skin breakdown will have a volume reduction of 80% by week 12 Date Initiated: 12/31/2015 Goal Status: Active Ulcer/skin breakdown will heal within 14 weeks Date Initiated: 12/31/2015 Goal Status: Active Interventions: Assess patient/caregiver ability to obtain necessary supplies Assess patient/caregiver ability to perform ulcer/skin care regimen upon admission and as needed Assess ulceration(s) every visit Notes: Electronic Signature(s) Signed: 02/04/2016 11:58:06 AM By: Gretta Cool, RN, BSN, Kim RN, BSN Entered By: Gretta Cool, RN, BSN, Kim on 02/04/2016 10:26:31 Mitchell [name] ([id number]) -------------------------------------------------------------------------------- Pain Assessment Details Patient Name: Mitchell [name] Date of Service: 02/04/2016 10:00 AM Medical Record Number: [id number] Patient Account Number: [id number] Date of Birth/Sex: 1934/09/26 (81 y.o. Male) Treating RN: Cornell Barman Primary Care Physician: Tedra Senegal Other Clinician: Referring Physician: Tedra Senegal Treating Physician/Extender: Ricard Dillon Weeks in Treatment: 5 Active Problems Location of Pain Severity and Description of Pain Patient Has Paino No Site Locations With Dressing Change: No Pain  Management and Medication Current Pain Management: Electronic Signature(s) Signed: 02/04/2016 11:58:06 AM By: Gretta Cool, RN, BSN, Kim RN, BSN Entered By: Gretta Cool, RN, BSN, Kim on 02/04/2016 10:11:55 Mitchell [name] ([id number]) -------------------------------------------------------------------------------- Patient/Caregiver Education Details Patient Name: Mitchell [name] Date of Service: 02/04/2016 10:00 AM Medical Record Number: [id number] Patient Account Number: [id number] Date of Birth/Gender: [birth year] (81 y.o. Male) Treating RN: Cornell Barman Primary Care Physician: Tedra Senegal Other Clinician: Referring Physician: Tedra Senegal Treating Physician/Extender: Tito Dine in  Treatment: 5 Education Assessment Education Provided To: Patient Education Topics Provided Wound/Skin Impairment: Handouts: Caring for Your Ulcer, Other: cntinue wound care as prescribed Methods: Demonstration Responses: State content correctly Electronic Signature(s) Signed: 02/04/2016 11:58:06 AM By: Gretta Cool, RN, BSN, Kim RN, BSN Entered By: Gretta Cool, RN, BSN, Kim on 02/04/2016 10:54:30 Mitchell [name] ([id number]) -------------------------------------------------------------------------------- Wound Assessment Details Patient Name: Mitchell [name] Date of Service: 02/04/2016 10:00 AM Medical Record Number: [id number] Patient Account Number: [id number] Date of Birth/Sex: 09/26/1934 (81 y.o. Male) Treating RN: Cornell Barman Primary Care Physician: Tedra Senegal Other Clinician: Referring Physician: Tedra Senegal Treating Physician/Extender: Ricard Dillon Weeks in Treatment: 5 Wound Status Wound Number: 1 Primary Open Surgical Wound Etiology: Wound Location: Back - Midline Wound Open Wounding Event: Surgical Injury Status: Date Acquired: 10/24/2015 Comorbid Cataracts, Asthma, Arrhythmia, Weeks Of Treatment: 5 History: Hypertension, Osteoarthritis Clustered Wound: No Wound Measurements Length: (cm) 1 Width: (cm) 0.3 Depth: (cm) 6.3 Area: (cm) 0.236 Volume: (cm) 1.484 % Reduction in Area: 31.8% % Reduction in Volume: 43.5% Epithelialization: None Wound Description Full Thickness Without Exposed Classification: Support Structures Wound Margin: Flat and Intact Exudate Large Amount: Exudate Type: Purulent Exudate Color: yellow, brown, green Foul Odor After Cleansing: No Wound Bed Granulation Amount: Large (67-100%) Exposed Structure Granulation Quality: Red Fascia Exposed: No Necrotic Amount: None Present (0%) Fat Layer Exposed: No Tendon Exposed: No Muscle Exposed: No Joint Exposed: No Bone Exposed: No Limited to Skin Breakdown Periwound Skin  Texture Texture Color No Abnormalities Noted: No No Abnormalities Noted: No Callus: No Atrophie Blanche: No Crepitus: No Cyanosis: No Mitchell [name], [name] ([id number]) Excoriation: Yes Ecchymosis: Yes Fluctuance: No Erythema: No Friable: No Hemosiderin Staining: No Induration: No Mottled: No Localized Edema: No Pallor: No Rash: No Rubor: No Scarring: No Temperature / Pain Moisture Temperature: No Abnormality No Abnormalities Noted: No Dry / Scaly: No Maceration: No Moist: Yes Wound Preparation Ulcer Cleansing: Rinsed/Irrigated with Saline Topical Anesthetic Applied: None Assessment Notes Reddened rash-like area surrounding the wound area. There are also a couple of red areas that appear they were from tape irritation. Treatment Notes Wound #1 (Midline Back) 1. Cleansed with: Clean wound with Normal Saline 2. Anesthetic Topical Lidocaine 4% cream to wound bed prior to debridement 3. Peri-wound Care: Antifungal cream 4. Dressing Applied: Aquacel Ag 5. Secondary Dressing Applied Bordered Foam Dressing Notes [employer] Electronic Signature(s) Signed: 02/04/2016 11:58:06 AM By: Gretta Cool, RN, BSN, Kim RN, BSN Entered By: Gretta Cool, RN, BSN, Kim on 02/04/2016 10:22:58 Mitchell [name], Mitchell [name] ([id number]) -------------------------------------------------------------------------------- Vitals Details Patient Name: Mitchell [name] Date of Service: 02/04/2016 10:00 AM Medical Record Number: [id number] Patient Account Number: [id number] Date of Birth/Sex: 10-30-1934 (81 y.o. Male) Treating RN: Cornell Barman Primary Care Physician: Tedra Senegal Other Clinician: Referring Physician: Tedra Senegal Treating Physician/Extender: Tito Dine in Treatment: 5 Vital Signs Time Taken: 10:11 Temperature (F): 97.5 Height (in): 75 Pulse (bpm): 122 Weight (lbs): 272 Respiratory Rate (breaths/min): 16 Body Mass Index (BMI): 34 Blood Pressure (mmHg): 120/82 Reference Range: 80 - 120  mg / dl Electronic Signature(s) Signed: 02/04/2016 11:58:06 AM By: Gretta Cool, RN, BSN, Kim RN, BSN Entered By: Gretta Cool, RN, BSN, Kim on 02/04/2016 10:13:13

## 2016-02-05 NOTE — Progress Notes (Signed)
RAED, ARISPE ([id number]) Visit Report for 02/04/2016 Chief Complaint Document Details Patient Name: Mitchell [name] Date of Service: 02/04/2016 10:00 AM Medical Record Patient Account Number: [id number] Number: Treating RN: Mitchell [name] July 13, 1934 (81 y.o. Other Clinician: Date of Birth/Sex: Male) Treating Mitchell [name] Primary Care Physician: Mitchell [name] Physician/Extender: Mitchell Referring Physician: Assunta [name] in Treatment: 5 Information Obtained from: Patient Chief Complaint 01/28/16 81 year old man admitted to clinic for review of his surgical wound in his lumbar spine area Electronic Signature(s) Signed: 02/04/2016 5:07:12 PM By: Mitchell Ham MD Entered By: Mitchell [name] on 02/04/2016 12:00:16 Mitchell [name] ([id number]) -------------------------------------------------------------------------------- HPI Details Patient Name: Mitchell [name] Date of Service: 02/04/2016 10:00 AM Medical Record Patient Account Number: [id number] Number: Treating RN: Mitchell [name] [birth year] (81 y.o. Other Clinician: Date of Birth/Sex: Male) Treating Mitchell [name] Primary Care Physician: Mitchell [name] Physician/Extender: Mitchell Referring Physician: Tedra [name] Weeks in Treatment: 5 History of Present Illness HPI Description: 12/31/15; this is a patient to was increasingly incapacitated over the last year with severe lumbar spinal stenosis and radiculopathy at L4-L5 L5-S1 with a large disc herniation at L2-L3. He had several spinal injections over the last year with no relief in his pain. On July 7 17 he had a redo decompression with fusion at L4-L5 L5-S1 and a laminectomy and microdiscectomy of L2-L3 with pedicle screw fixation L2-S1. Is not really clear to me at the time of this dictation as to the exact course of this wound. However he was discharged to rehabilitation at [facility] and it was very clear at the end of the stay here that there was an  open area for which she had a wound VAC for a period of time. A culture on 10/29/15 showed both Pseudomonas and Klebsiella. He was discharged on ciprofloxacin and he still remains on that currently. He has home health going into his home now and doing iodoform packing. He has a follow-up with his neurosurgeon Dr. Vertell [name] tomorrow. There is still moderate amount of drainage. However the patient denies fever or chills or excessive pain. He is working hard with physical therapy in order to gain ambulatory status. He has a right foot drop for which he uses an AFO brace. As far as I can tell he has not had any advanced imaging of the low back although he apparently a has had plain x-rays in the neurosurgeon's office. No recent cultures. 01/14/16; culture from 2 weeks ago was negative. He went to sees Dr. Vertell [name] last week who is still was reluctant to proceed with any more imaging studies, they follow with him on Jan 22, 2023. They're using Aquacel Ag packing but still having copious amounts of clear yellowish drainage. This is being changed once a day. He has not been systemically unwell, no pain no fever and really no complaints other than the periwound is itchy 01/28/16 at this point in time patient has had a follow-up visit with Dr. Melven [name] neurosurgeon who is still not recommending any further advanced imaging studies at this point in time. We have been using Aquacel Ag packing although Dr. Vertell [name] didn't want him not to pack this as tightly. This is being changed daily. He has no signs or symptoms of systemic infection and his main issue is that he has a rash and itching around the periwound region. They have been using over-the-counter antifungal cream nothing prescribed up to this point. Fortunately he is not having a significant amount of pain at this point in time and  rates his discomfort to be a 1-2 out of 10 02/04/16 no major change since I last saw this wound 3 weeks ago. Depth is 6.3 cm although  his wife reports that there is much less drainage he has completed antibiotics although I don't know that we prescribed them. He had some irritation to the periwound and was given a combination of nystatin and triamcinolone apparently he developed severe irritation from this Electronic Signature(s) Signed: 02/04/2016 5:07:12 PM By: Mitchell Ham MD Entered By: Mitchell [name] on 02/04/2016 14:33:27 Mitchell [name], Mitchell [name] ([id number]) Mitchell [name], Mitchell [name] ([id number]) -------------------------------------------------------------------------------- Physical Exam Details Patient Name: Mitchell [name] Date of Service: 02/04/2016 10:00 AM Medical Record Patient Account Number: [id number] Number: Treating RN: Mitchell [name] [birth year] (81 y.o. Other Clinician: Date of Birth/Sex: Male) Treating Mitchell [name] Primary Care Physician: Mitchell [name] Physician/Extender: Mitchell Referring Physician: Tedra [name] Weeks in Treatment: 5 Notes Wound exam; there is no obvious change in the wound at 6.3 cm. periwound still looks like tinea vs contact dermatitis Electronic Signature(s) Signed: 02/04/2016 5:07:12 PM By: Mitchell Ham MD Entered By: Mitchell [name] on 02/04/2016 12:03:58 Mitchell [name] ([id number]) -------------------------------------------------------------------------------- Physician Orders Details Patient Name: Mitchell [name] Date of Service: 02/04/2016 10:00 AM Medical Record Patient Account Number: [id number] Number: Treating RN: Mitchell [name] 1935-04-06 (81 y.o. Other Clinician: Date of Birth/Sex: Male) Treating Mitchell [name] Primary Care Physician: Mitchell [name] Physician/Extender: Mitchell Referring Physician: Assunta [name] in Treatment: 5 Verbal / Phone Orders: Yes Clinician: Cornell [name] Read Back and Verified: Yes Diagnosis Coding Wound Cleansing Wound #1 Midline Back o Clean wound with Normal Saline. o May Shower, gently pat wound dry prior to  applying new dressing. Primary Wound Dressing Wound #1 Midline Back o Aquacel Ag - aquacel ag rope - HHRN to provide this to patient Secondary Dressing Wound #1 Midline Back o Boardered Foam Dressing - Allevyn Life or Equal to protect surrounding skin from tape damage. Jeanie Sewer - HHRN please provide xtrasorb to patient (or equal) - may use ABD pad OVER xtrasorb for added protection but xtrasorb will protect periwound skin from moisture. DO NOT CUT XTRA-SORB! Dressing Change Frequency Wound #1 Midline Back o Change dressing every day. Follow-up Appointments Wound #1 Midline Back o Return Appointment in 1 week. Home Health Wound #1 Midline Back o Continue Home Health Visits - [facility] (twice weekly) o Home Health Nurse may visit PRN to address patientos wound care needs. o FACE TO FACE ENCOUNTER: MEDICARE and MEDICAID PATIENTS: I certify that this patient is under my care and that I had a face-to-face encounter that meets the physician face-to-face encounter requirements with this patient on this date. The encounter with the patient was in whole or in part for the following MEDICAL CONDITION: (primary reason for [place]) MEDICAL NECESSITY: I certify, that based on my findings, NURSING services are a medically necessary home health service. HOME BOUND STATUS: I certify that my clinical findings support that this patient is homebound (i.e., Due to illness or injury, pt requires aid of Rizor, Zidan W. ([id number]) supportive devices such as crutches, cane, wheelchairs, walkers, the use of special transportation or the assistance of another person to leave their place of residence. There is a normal inability to leave the home and doing so requires considerable and taxing effort. Other absences are for medical reasons / religious services and are infrequent or of short duration when for other reasons). o If current dressing causes regression in wound condition,  may D/C ordered dressing product/s  and apply Normal Saline Moist Dressing daily until next [place] / Other MD appointment. [place] of regression in wound condition at [phone]. o Please direct any NON-WOUND related issues/requests for orders to patient's Primary Care Physician Medications-please add to medication list. Wound #1 Midline Back o Other: - Lotrasone (Nystatin in clinic) Electronic Signature(s) Signed: 02/04/2016 11:58:06 AM By: Gretta Cool RN, BSN, Kim RN, BSN Signed: 02/04/2016 5:07:12 PM By: Mitchell Ham MD Entered By: Gretta Cool, RN, BSN, Kim on 02/04/2016 10:52:45 Mitchell [name], Mitchell [name] ([id number]) -------------------------------------------------------------------------------- Problem List Details Patient Name: Mitchell [name] Date of Service: 02/04/2016 10:00 AM Medical Record Patient Account Number: [id number] Number: Treating RN: Mitchell [name] 1934-12-30 (81 y.o. 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 Other Clinician: Date of Birth/Sex: Male) Treating Annaleigha Woo Primary Care Physician: Mitchell [name] Physician/Extender: Mitchell Referring Physician: Assunta [name] in Treatment: 5 Subjective Chief Complaint Information obtained from Patient 01/28/16 81 year old man admitted to clinic for review of his surgical wound in his lumbar spine area History of Present Illness (HPI) 12/31/15; this is a patient to was increasingly incapacitated over the last year with severe lumbar spinal stenosis and radiculopathy at L4-L5 L5-S1 with a large disc herniation at L2-L3. He had several spinal injections over the last year with no relief in his pain. On July 7 17 he had a redo decompression with fusion at L4-L5 L5-S1 and a laminectomy and microdiscectomy of L2-L3 with pedicle screw fixation L2-S1. Is not really clear to me at the time of this dictation as to the exact course of this wound. However he was discharged to rehabilitation at [facility] and it was very clear at the end of the stay here that there was an open area for which she had a wound VAC for a period of time. A culture on 10/29/15 showed both Pseudomonas and Klebsiella. He was discharged on ciprofloxacin and he still remains on that currently. He has home health going into his home now and doing iodoform packing. He has a follow-up with his neurosurgeon Dr. Vertell [name] tomorrow. There is still moderate amount of drainage. However the patient denies fever or chills or excessive pain. He is working hard with physical therapy in order to gain ambulatory status. He has a right foot drop for which he uses an AFO brace. As far as I can tell he has not had any advanced imaging of the low back although he apparently a has had plain x-rays in the neurosurgeon's office. No recent cultures. 01/14/16; culture from 2 weeks ago was negative. He went to sees Dr. Vertell [name] last week who is still was reluctant to proceed with any more  imaging studies, they follow with him on 01-31-2023. They're using Aquacel Ag packing but still having copious amounts of clear yellowish drainage. This is being changed once a  day. He has not been systemically unwell, no pain no fever and really no complaints other than the periwound is itchy 01/28/16 at this point in time patient has had a follow-up visit with Dr. Melven [name] neurosurgeon who is still not recommending any further advanced imaging studies at this point in time. We have been using Aquacel Ag packing although Dr. Vertell [name] didn't want him not to pack this as tightly. This is being changed daily. He has no signs or symptoms of systemic infection and his main issue is that he has a rash and itching around the periwound region. They have been using over-the-counter antifungal cream nothing prescribed up to this point. Fortunately he is not having a significant amount of pain at this point in time and rates his discomfort to be a 1-2 out of 10 02/03/16 no major change since I last saw this wound 3 weeks ago. Depth is 6.3 cm although his wife reports that there is much less drainage he has completed antibiotics although I don't know that we prescribed them. He had some irritation to the periwound and was given a combination of nystatin and triamcinolone apparently he developed severe irritation from this Mitchell [name], [name] ([id number]) Objective Constitutional Vitals Time Taken: 10:11 AM, Height: 75 in, Weight: 272 lbs, BMI: 34, Temperature: 97.5 F, Pulse: 122 bpm, Respiratory Rate: 16 breaths/min, Blood Pressure: 120/82 mmHg. Integumentary (Hair, Skin) Wound #1 status is Open. Original cause of wound was Surgical Injury. The wound is located on the Midline Back. The wound measures 1cm length x 0.3cm width x 6.3cm depth; 0.236cm^2 area and 1.484cm^3 volume. The wound is limited to skin breakdown. There is a large amount of purulent drainage noted. The wound margin is flat and intact. There  is large (67-100%) red granulation within the wound bed. There is no necrotic tissue within the wound bed. The periwound skin appearance exhibited: Excoriation, Moist, Ecchymosis. The periwound skin appearance did not exhibit: Callus, Crepitus, Fluctuance, Friable, Induration, Localized Edema, Rash, Scarring, Dry/Scaly, Maceration, Atrophie Blanche, Cyanosis, Hemosiderin Staining, Mottled, Pallor, Rubor, Erythema. Periwound temperature was noted as No Abnormality. General Notes: Reddened rash-like area surrounding the wound area. There are also a couple of red areas that appear they were from tape irritation. 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 Mitchell [name], Mitchell [name] ([id number]) May Shower, gently pat wound dry prior to applying new dressing. Primary Wound Dressing: Wound #1 Midline Back: Aquacel Ag - aquacel ag rope - HHRN to provide this to patient Secondary Dressing: Wound #1 Midline Back: Boardered Foam Dressing - Allevyn Life or Equal to protect surrounding skin from tape damage. XtraSorb - HHRN please provide xtrasorb to patient (or equal) - may use ABD pad OVER xtrasorb for added protection but xtrasorb will protect periwound skin from moisture. DO NOT CUT XTRA-SORB! Dressing Change Frequency: Wound #1 Midline Back: Change dressing every day. Follow-up Appointments: Wound #1 Midline Back: Return Appointment in 1 week. Home Health: Wound #1 Midline Back: Continue Home Health Visits - [facility] (twice weekly) Home Health Nurse may visit PRN to address patient s wound care needs. FACE TO FACE ENCOUNTER: MEDICARE and MEDICAID PATIENTS: I certify that this patient is under my care and that I  had a face-to-face encounter that meets the physician face-to-face encounter requirements with this patient  on this date. The encounter with the patient was in whole or in part for the following MEDICAL CONDITION: (primary reason for [place]) MEDICAL NECESSITY: I certify, that based on my findings, NURSING services are a medically necessary home health service. HOME BOUND STATUS: I certify that my clinical findings support that this patient is homebound (i.e., Due to illness or injury, pt requires aid of supportive devices such as crutches, cane, wheelchairs, walkers, the use of special transportation or the assistance of another person to leave their place of residence. There is a normal inability to leave the home and doing so requires considerable and taxing effort. Other absences are for medical reasons / religious services and are infrequent or of short duration when for other reasons). If current dressing causes regression in wound condition, may D/C ordered dressing product/s and apply Normal Saline Moist Dressing daily until next [place] / Other MD appointment. [place] of regression in wound condition at [phone]. Please direct any NON-WOUND related issues/requests for orders to patient's Primary Care Physician Medications-please add to medication list.: Wound #1 Midline Back: Other: - Lotrasone (Nystatin in clinic) continue with Aquacel AG rope. use lotrimin to periwound Electronic Signature(s) Signed: 02/04/2016 5:07:12 PM By: Mitchell Ham MD Mitchell [name], Mitchell [name] ([id number]) Entered By: Mitchell [name] on 02/04/2016 12:04:35 Mitchell [name], Mitchell [name] ([id number]) -------------------------------------------------------------------------------- SuperBill Details Patient Name: Mitchell [name] Date of Service: 02/04/2016 Medical Record Patient Account Number: [id number] Number: Treating RN: Mitchell [name] 1934/11/29 (81 y.o. 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

## 2016-02-09 DIAGNOSIS — M21371 Foot drop, right foot: Secondary | ICD-10-CM | POA: Diagnosis not present

## 2016-02-09 DIAGNOSIS — B961 Klebsiella pneumoniae [K. pneumoniae] as the cause of diseases classified elsewhere: Secondary | ICD-10-CM | POA: Diagnosis not present

## 2016-02-09 DIAGNOSIS — M4316 Spondylolisthesis, lumbar region: Secondary | ICD-10-CM | POA: Diagnosis not present

## 2016-02-09 DIAGNOSIS — T814XXD Infection following a procedure, subsequent encounter: Secondary | ICD-10-CM | POA: Diagnosis not present

## 2016-02-09 DIAGNOSIS — B965 Pseudomonas (aeruginosa) (mallei) (pseudomallei) as the cause of diseases classified elsewhere: Secondary | ICD-10-CM | POA: Diagnosis not present

## 2016-02-09 DIAGNOSIS — M5116 Intervertebral disc disorders with radiculopathy, lumbar region: Secondary | ICD-10-CM | POA: Diagnosis not present

## 2016-02-10 ENCOUNTER — Ambulatory Visit: Payer: Medicare Other | Admitting: Internal Medicine

## 2016-02-10 DIAGNOSIS — B961 Klebsiella pneumoniae [K. pneumoniae] as the cause of diseases classified elsewhere: Secondary | ICD-10-CM | POA: Diagnosis not present

## 2016-02-10 DIAGNOSIS — M5116 Intervertebral disc disorders with radiculopathy, lumbar region: Secondary | ICD-10-CM | POA: Diagnosis not present

## 2016-02-10 DIAGNOSIS — M4316 Spondylolisthesis, lumbar region: Secondary | ICD-10-CM | POA: Diagnosis not present

## 2016-02-10 DIAGNOSIS — M21371 Foot drop, right foot: Secondary | ICD-10-CM | POA: Diagnosis not present

## 2016-02-10 DIAGNOSIS — T814XXD Infection following a procedure, subsequent encounter: Secondary | ICD-10-CM | POA: Diagnosis not present

## 2016-02-10 DIAGNOSIS — B965 Pseudomonas (aeruginosa) (mallei) (pseudomallei) as the cause of diseases classified elsewhere: Secondary | ICD-10-CM | POA: Diagnosis not present

## 2016-02-11 DIAGNOSIS — T8189XD Other complications of procedures, not elsewhere classified, subsequent encounter: Secondary | ICD-10-CM | POA: Diagnosis not present

## 2016-02-11 DIAGNOSIS — M5416 Radiculopathy, lumbar region: Secondary | ICD-10-CM | POA: Diagnosis not present

## 2016-02-11 DIAGNOSIS — M545 Low back pain: Secondary | ICD-10-CM | POA: Diagnosis not present

## 2016-02-13 DIAGNOSIS — B961 Klebsiella pneumoniae [K. pneumoniae] as the cause of diseases classified elsewhere: Secondary | ICD-10-CM | POA: Diagnosis not present

## 2016-02-13 DIAGNOSIS — B965 Pseudomonas (aeruginosa) (mallei) (pseudomallei) as the cause of diseases classified elsewhere: Secondary | ICD-10-CM | POA: Diagnosis not present

## 2016-02-13 DIAGNOSIS — M5116 Intervertebral disc disorders with radiculopathy, lumbar region: Secondary | ICD-10-CM | POA: Diagnosis not present

## 2016-02-13 DIAGNOSIS — M4316 Spondylolisthesis, lumbar region: Secondary | ICD-10-CM | POA: Diagnosis not present

## 2016-02-13 DIAGNOSIS — M21371 Foot drop, right foot: Secondary | ICD-10-CM | POA: Diagnosis not present

## 2016-02-13 DIAGNOSIS — T814XXD Infection following a procedure, subsequent encounter: Secondary | ICD-10-CM | POA: Diagnosis not present

## 2016-02-15 NOTE — Patient Instructions (Signed)
Zithromax Z-PAK refilled. Continue albuterol and Symbicort. Take Lasix every other day and follow-up in 3 weeks

## 2016-02-16 DIAGNOSIS — M21371 Foot drop, right foot: Secondary | ICD-10-CM | POA: Diagnosis not present

## 2016-02-16 DIAGNOSIS — B965 Pseudomonas (aeruginosa) (mallei) (pseudomallei) as the cause of diseases classified elsewhere: Secondary | ICD-10-CM | POA: Diagnosis not present

## 2016-02-16 DIAGNOSIS — B961 Klebsiella pneumoniae [K. pneumoniae] as the cause of diseases classified elsewhere: Secondary | ICD-10-CM | POA: Diagnosis not present

## 2016-02-16 DIAGNOSIS — M4316 Spondylolisthesis, lumbar region: Secondary | ICD-10-CM | POA: Diagnosis not present

## 2016-02-16 DIAGNOSIS — M5116 Intervertebral disc disorders with radiculopathy, lumbar region: Secondary | ICD-10-CM | POA: Diagnosis not present

## 2016-02-16 DIAGNOSIS — T814XXD Infection following a procedure, subsequent encounter: Secondary | ICD-10-CM | POA: Diagnosis not present

## 2016-02-17 ENCOUNTER — Encounter: Payer: Medicare Other | Admitting: Physician Assistant

## 2016-02-17 DIAGNOSIS — T8131XA Disruption of external operation (surgical) wound, not elsewhere classified, initial encounter: Secondary | ICD-10-CM | POA: Diagnosis not present

## 2016-02-17 DIAGNOSIS — J45909 Unspecified asthma, uncomplicated: Secondary | ICD-10-CM | POA: Diagnosis not present

## 2016-02-17 DIAGNOSIS — M48061 Spinal stenosis, lumbar region without neurogenic claudication: Secondary | ICD-10-CM | POA: Diagnosis not present

## 2016-02-17 DIAGNOSIS — M1990 Unspecified osteoarthritis, unspecified site: Secondary | ICD-10-CM | POA: Diagnosis not present

## 2016-02-17 DIAGNOSIS — L98429 Non-pressure chronic ulcer of back with unspecified severity: Secondary | ICD-10-CM | POA: Diagnosis not present

## 2016-02-17 DIAGNOSIS — T8131XD Disruption of external operation (surgical) wound, not elsewhere classified, subsequent encounter: Secondary | ICD-10-CM | POA: Diagnosis not present

## 2016-02-17 DIAGNOSIS — I10 Essential (primary) hypertension: Secondary | ICD-10-CM | POA: Diagnosis not present

## 2016-02-18 DIAGNOSIS — M4316 Spondylolisthesis, lumbar region: Secondary | ICD-10-CM | POA: Diagnosis not present

## 2016-02-18 DIAGNOSIS — B965 Pseudomonas (aeruginosa) (mallei) (pseudomallei) as the cause of diseases classified elsewhere: Secondary | ICD-10-CM | POA: Diagnosis not present

## 2016-02-18 DIAGNOSIS — M5116 Intervertebral disc disorders with radiculopathy, lumbar region: Secondary | ICD-10-CM | POA: Diagnosis not present

## 2016-02-18 DIAGNOSIS — M21371 Foot drop, right foot: Secondary | ICD-10-CM | POA: Diagnosis not present

## 2016-02-18 DIAGNOSIS — B961 Klebsiella pneumoniae [K. pneumoniae] as the cause of diseases classified elsewhere: Secondary | ICD-10-CM | POA: Diagnosis not present

## 2016-02-18 DIAGNOSIS — T814XXD Infection following a procedure, subsequent encounter: Secondary | ICD-10-CM | POA: Diagnosis not present

## 2016-02-18 NOTE — Progress Notes (Signed)
Mitchell [name], Mitchell [name] ([id number]) Visit Report for 02/17/2016 Arrival Information Details Patient Name: OLAWALE, STONEBURNER Date of Service: 02/17/2016 10:45 AM Medical Record Number: [id number] Patient Account Number: [id number] Date of Birth/Sex: 1935-03-30 (81 y.o. Male) Treating RN: Montey Hora Primary Care Physician: Tedra Senegal Other Clinician: Referring Physician: Tedra Senegal Treating Physician/Extender: Melburn Hake, Mitchell [name] Weeks in Treatment: 6 Visit Information History Since Last Visit Added or deleted any medications: No Patient Arrived: Walker Any new allergies or adverse reactions: No Arrival Time: 11:06 Had a fall or experienced change in No Accompanied By: spouse activities of daily living that may affect Transfer Assistance: None risk of falls: Patient Identification Verified: Yes Signs or symptoms of abuse/neglect since last No Secondary Verification Process Completed: Yes visito Patient Requires Transmission-Based No Hospitalized since last visit: No Precautions: Pain Present Now: No Patient Has Alerts: No Electronic Signature(s) Signed: 02/17/2016 5:11:35 PM By: Montey Hora Entered By: Montey Hora on 02/17/2016 11:06:45 Mitchell [name] ([id number]) -------------------------------------------------------------------------------- Clinic Level of Care Assessment Details Patient Name: Mitchell [name] Date of Service: 02/17/2016 10:45 AM Medical Record Number: [id number] Patient Account Number: [id number] Date of Birth/Sex: 09-01-1934 (81 y.o. Male) Treating RN: Montey Hora Primary Care Physician: Tedra Senegal Other Clinician: Referring Physician: Tedra Senegal Treating Physician/Extender: Melburn Hake, Mitchell [name] Weeks in Treatment: 6 Clinic Level of Care Assessment Items TOOL 4 Quantity Score []  - Use when only an EandM is performed on FOLLOW-UP visit 0 ASSESSMENTS - Nursing Assessment / Reassessment X - Reassessment of Co-morbidities (includes updates in  patient status) 1 10 X - Reassessment of Adherence to Treatment Plan 1 5 ASSESSMENTS - Wound and Skin Assessment / Reassessment X - Simple Wound Assessment / Reassessment - one wound 1 5 []  - Complex Wound Assessment / Reassessment - multiple wounds 0 []  - Dermatologic / Skin Assessment (not related to wound area) 0 ASSESSMENTS - Focused Assessment []  - Circumferential Edema Measurements - multi extremities 0 []  - Nutritional Assessment / Counseling / Intervention 0 []  - Lower Extremity Assessment (monofilament, tuning fork, pulses) 0 []  - Peripheral Arterial Disease Assessment (using hand held doppler) 0 ASSESSMENTS - Ostomy and/or Continence Assessment and Care []  - Incontinence Assessment and Management 0 []  - Ostomy Care Assessment and Management (repouching, etc.) 0 PROCESS - Coordination of Care X - Simple Patient / Family Education for ongoing care 1 15 []  - Complex (extensive) Patient / Family Education for ongoing care 0 []  - Staff obtains Programmer, systems, Records, Test Results / Process Orders 0 []  - Staff telephones HHA, Nursing Homes / Clarify orders / etc 0 []  - Routine Transfer to another Facility (non-emergent condition) 0 Mitchell [name], [name] ([id number]) []  - Routine Hospital Admission (non-emergent condition) 0 []  - New Admissions / Biomedical engineer / Ordering NPWT, Apligraf, etc. 0 []  - Emergency Hospital Admission (emergent condition) 0 X - Simple Discharge Coordination 1 10 []  - Complex (extensive) Discharge Coordination 0 PROCESS - Special Needs []  - Pediatric / Minor Patient Management 0 []  - Isolation Patient Management 0 []  - Hearing / Language / Visual special needs 0 []  - Assessment of Community assistance (transportation, D/C planning, etc.) 0 []  - Additional assistance / Altered mentation 0 []  - Support Surface(s) Assessment (bed, cushion, seat, etc.) 0 INTERVENTIONS - Wound Cleansing / Measurement X - Simple Wound Cleansing - one wound 1 5 []  - Complex  Wound Cleansing - multiple wounds 0 X - Wound Imaging (photographs - any number of wounds) 1 5 []  - Wound Tracing (instead of photographs) 0  X - Simple Wound Measurement - one wound 1 5 []  - Complex Wound Measurement - multiple wounds 0 INTERVENTIONS - Wound Dressings X - Small Wound Dressing one or multiple wounds 1 10 []  - Medium Wound Dressing one or multiple wounds 0 []  - Large Wound Dressing one or multiple wounds 0 []  - Application of Medications - topical 0 []  - Application of Medications - injection 0 INTERVENTIONS - Miscellaneous []  - External ear exam 0 Mitchell [name], [name] ([id number]) []  - Specimen Collection (cultures, biopsies, blood, body fluids, etc.) 0 []  - Specimen(s) / Culture(s) sent or taken to Lab for analysis 0 []  - Patient Transfer (multiple staff / Harrel Lemon Lift / Similar devices) 0 []  - Simple Staple / Suture removal (25 or less) 0 []  - Complex Staple / Suture removal (26 or more) 0 []  - Hypo / Hyperglycemic Management (close monitor of Blood Glucose) 0 []  - Ankle / Brachial Index (ABI) - do not check if billed separately 0 X - Vital Signs 1 5 Has the patient been seen at the hospital within the last three years: Yes Total Score: 75 Level Of Care: New/Established - Level 2 Electronic Signature(s) Signed: 02/17/2016 5:11:35 PM By: Montey Hora Entered By: Montey Hora on 02/17/2016 11:33:20 Mitchell [name] ([id number]) -------------------------------------------------------------------------------- Encounter Discharge Information Details Patient Name: Mitchell [name] Date of Service: 02/17/2016 10:45 AM Medical Record Number: [id number] Patient Account Number: [id number] Date of Birth/Sex: Nov 07, 1934 (81 y.o. Male) Treating RN: Montey Hora Primary Care Physician: Tedra Senegal Other Clinician: Referring Physician: Tedra Senegal Treating Physician/Extender: Melburn Hake, Mitchell [name] Weeks in Treatment: 6 Encounter Discharge Information Items Discharge Pain  Level: 0 Discharge Condition: Stable Ambulatory Status: Walker Discharge Destination: Home Transportation: Private Auto Accompanied By: spouse Schedule Follow-up Appointment: Yes Medication Reconciliation completed and provided to Patient/Care No Julliette Frentz: Provided on Clinical Summary of Care: 02/17/2016 Form Type Recipient Paper Patient HD Electronic Signature(s) Signed: 02/17/2016 11:51:19 AM By: Ruthine Dose Entered By: Ruthine Dose on 02/17/2016 11:51:19 Mitchell [name] ([id number]) -------------------------------------------------------------------------------- Multi Wound Chart Details Patient Name: Mitchell [name] Date of Service: 02/17/2016 10:45 AM Medical Record Number: [id number] Patient Account Number: [id number] Date of Birth/Sex: Sep 08, 1934 (81 y.o. Male) Treating RN: Montey Hora Primary Care Physician: Tedra Senegal Other Clinician: Referring Physician: Tedra Senegal Treating Physician/Extender: Mitchell [name], Mitchell [name] Weeks in Treatment: 6 Vital Signs Height(in): 75 Pulse(bpm): 122 Weight(lbs): 272 Blood Pressure 143/95 (mmHg): Body Mass Index(BMI): 34 Temperature(F): 97.4 Respiratory Rate 16 (breaths/min): Photos: [N/A:N/A] Wound Location: Back - Midline N/A N/A Wounding Event: Surgical Injury N/A N/A Primary Etiology: Open Surgical Wound N/A N/A Comorbid History: Cataracts, Asthma, N/A N/A Arrhythmia, Hypertension, Osteoarthritis Date Acquired: 10/24/2015 N/A N/A Weeks of Treatment: 6 N/A N/A Wound Status: Open N/A N/A Measurements L x W x D 0.6x0.2x5.7 N/A N/A (cm) Area (cm) : 0.094 N/A N/A Volume (cm) : 0.537 N/A N/A % Reduction in Area: 72.80% N/A N/A % Reduction in Volume: 79.60% N/A N/A Classification: Full Thickness Without N/A N/A Exposed Support Structures Exudate Amount: Large N/A N/A Exudate Type: Purulent N/A N/A Exudate Color: yellow, brown, green N/A N/A Wound Margin: Flat and Intact N/A N/A Granulation Amount: Large  (67-100%) N/A N/A Mitchell [name], [name] ([id number]) Granulation Quality: Red N/A N/A Necrotic Amount: None Present (0%) N/A N/A Exposed Structures: Fascia: No N/A N/A Fat: No Tendon: No Muscle: No Joint: No Bone: No Limited to Skin Breakdown Epithelialization: None N/A N/A Periwound Skin Texture: Excoriation: Yes N/A N/A Edema: No Induration: No Callus: No Crepitus: No Fluctuance:  No Friable: No Rash: No Scarring: No Periwound Skin Moist: Yes N/A N/A Moisture: Maceration: No Dry/Scaly: No Periwound Skin Color: Ecchymosis: Yes N/A N/A Atrophie Blanche: No Cyanosis: No Erythema: No Hemosiderin Staining: No Mottled: No Pallor: No Rubor: No Temperature: No Abnormality N/A N/A Tenderness on No N/A N/A Palpation: Wound Preparation: Ulcer Cleansing: N/A N/A Rinsed/Irrigated with Saline Topical Anesthetic Applied: None Treatment Notes Electronic Signature(s) Signed: 02/17/2016 5:11:35 PM By: Montey Hora Entered By: Montey Hora on 02/17/2016 11:31:18 Mitchell [name] ([id number]) -------------------------------------------------------------------------------- [place] Details Patient Name: Mitchell [name] Date of Service: 02/17/2016 10:45 AM Medical Record Number: [id number] Patient Account Number: [id number] Date of Birth/Sex: [birth year] (81 y.o. Male) Treating RN: Montey Hora Primary Care Physician: Tedra Senegal Other Clinician: Referring Physician: Tedra Senegal Treating Physician/Extender: Melburn Hake, Mitchell [name] Weeks in Treatment: 6 Active Inactive Abuse / Safety / Falls / Self Care Management Nursing Diagnoses: Impaired physical mobility Potential for falls Goals: Patient will remain injury free Date Initiated: 12/31/2015 Goal Status: Active Interventions: Assess fall risk on admission and as needed Notes: Orientation to the Wound Care Program Nursing Diagnoses: Knowledge deficit related to the wound healing center  program Goals: Patient/caregiver will verbalize understanding of the [place] Program Date Initiated: 12/31/2015 Goal Status: Active Interventions: Provide education on orientation to the wound center Notes: Wound/Skin Impairment Nursing Diagnoses: Impaired tissue integrity Goals: Patient/caregiver will verbalize understanding of skin care regimen Mitchell [name], Mitchell [name] ([id number]) Date Initiated: 12/31/2015 Goal Status: Active Ulcer/skin breakdown will have a volume reduction of 30% by week 4 Date Initiated: 12/31/2015 Goal Status: Active Ulcer/skin breakdown will have a volume reduction of 50% by week 8 Date Initiated: 12/31/2015 Goal Status: Active Ulcer/skin breakdown will have a volume reduction of 80% by week 12 Date Initiated: 12/31/2015 Goal Status: Active Ulcer/skin breakdown will heal within 14 weeks Date Initiated: 12/31/2015 Goal Status: Active Interventions: Assess patient/caregiver ability to obtain necessary supplies Assess patient/caregiver ability to perform ulcer/skin care regimen upon admission and as needed Assess ulceration(s) every visit Notes: Electronic Signature(s) Signed: 02/17/2016 5:11:35 PM By: Montey Hora Entered By: Montey Hora on 02/17/2016 11:31:06 Mitchell [name] ([id number]) -------------------------------------------------------------------------------- Pain Assessment Details Patient Name: Mitchell [name] Date of Service: 02/17/2016 10:45 AM Medical Record Number: [id number] Patient Account Number: [id number] Date of Birth/Sex: 1934/11/11 (81 y.o. Male) Treating RN: Montey Hora Primary Care Physician: Tedra Senegal Other Clinician: Referring Physician: Tedra Senegal Treating Physician/Extender: Melburn Hake, Mitchell [name] Weeks in Treatment: 6 Active Problems Location of Pain Severity and Description of Pain Patient Has Paino No Site Locations Pain Management and Medication Current Pain Management: Notes Topical or injectable  lidocaine is offered to patient for acute pain when surgical debridement is performed. If needed, Patient is instructed to use over the counter pain medication for the following 24-48 hours after debridement. Wound care MDs do not prescribed pain medications. Patient has chronic pain or uncontrolled pain. Patient has been instructed to make an appointment with their Primary Care Physician for pain management. Electronic Signature(s) Signed: 02/17/2016 5:11:35 PM By: Montey Hora Entered By: Montey Hora on 02/17/2016 11:06:57 Mitchell [name] ([id number]) -------------------------------------------------------------------------------- Patient/Caregiver Education Details Patient Name: Mitchell [name] Date of Service: 02/17/2016 10:45 AM Medical Record Number: [id number] Patient Account Number: [id number] Date of Birth/Gender: [birth year] (81 y.o. Male) Treating RN: Montey Hora Primary Care Physician: Tedra Senegal Other Clinician: Referring Physician: Tedra Senegal Treating Physician/Extender: Sharalyn Ink in Treatment: 6 Education Assessment Education Provided To: Patient and Caregiver Education Topics Provided Wound/Skin Impairment: Handouts:  Other: wound care as ordered Methods: Demonstration, Explain/Verbal Responses: State content correctly Electronic Signature(s) Signed: 02/17/2016 5:11:35 PM By: Montey Hora Entered By: Montey Hora on 02/17/2016 11:51:09 Mitchell [name] ([id number]) -------------------------------------------------------------------------------- Wound Assessment Details Patient Name: Mitchell [name] Date of Service: 02/17/2016 10:45 AM Medical Record Number: [id number] Patient Account Number: [id number] Date of Birth/Sex: [birth year] (81 y.o. Male) Treating RN: Montey Hora Primary Care Physician: Tedra Senegal Other Clinician: Referring Physician: Tedra Senegal Treating Physician/Extender: Melburn Hake, Mitchell [name] Weeks in Treatment:  6 Wound Status Wound Number: 1 Primary Open Surgical Wound Etiology: Wound Location: Back - Midline Wound Open Wounding Event: Surgical Injury Status: Date Acquired: 10/24/2015 Comorbid Cataracts, Asthma, Arrhythmia, Weeks Of Treatment: 6 History: Hypertension, Osteoarthritis Clustered Wound: No Photos Wound Measurements Length: (cm) 0.6 Width: (cm) 0.2 Depth: (cm) 5.7 Area: (cm) 0.094 Volume: (cm) 0.537 % Reduction in Area: 72.8% % Reduction in Volume: 79.6% Epithelialization: None Tunneling: No Undermining: No Wound Description Full Thickness Without Exposed Classification: Support Structures Wound Margin: Flat and Intact Exudate Large Amount: Exudate Type: Purulent Exudate Color: yellow, brown, green Foul Odor After Cleansing: No Wound Bed Granulation Amount: Large (67-100%) Exposed Structure Granulation Quality: Red Fascia Exposed: No Necrotic Amount: None Present (0%) Fat Layer Exposed: No Mitchell [name], [name] ([id number]) Tendon Exposed: No Muscle Exposed: No Joint Exposed: No Bone Exposed: No Limited to Skin Breakdown Periwound Skin Texture Texture Color No Abnormalities Noted: No No Abnormalities Noted: No Callus: No Atrophie Blanche: No Crepitus: No Cyanosis: No Excoriation: Yes Ecchymosis: Yes Fluctuance: No Erythema: No Friable: No Hemosiderin Staining: No Induration: No Mottled: No Localized Edema: No Pallor: No Rash: No Rubor: No Scarring: No Temperature / Pain Moisture Temperature: No Abnormality No Abnormalities Noted: No Dry / Scaly: No Maceration: No Moist: Yes Wound Preparation Ulcer Cleansing: Rinsed/Irrigated with Saline Topical Anesthetic Applied: None Treatment Notes Wound #1 (Midline Back) 1. Cleansed with: Clean wound with Normal Saline 2. Anesthetic Topical Lidocaine 4% cream to wound bed prior to debridement 4. Dressing Applied: Iodoform packing Gauze Other dressing (specify in notes) 5. Secondary Dressing  Applied Bordered Foam Dressing Dry Gauze Notes [employer] Electronic Signature(s) Signed: 02/17/2016 5:11:35 PM By: Montey Hora Entered By: Montey Hora on 02/17/2016 11:18:05 Mitchell [name], Mitchell [name] ([id number]) Mitchell [name], Mitchell [name] ([id number]) -------------------------------------------------------------------------------- Vitals Details Patient Name: Mitchell [name] Date of Service: 02/17/2016 10:45 AM Medical Record Number: [id number] Patient Account Number: [id number] Date of Birth/Sex: 17-Jul-1934 (81 y.o. Male) Treating RN: Montey Hora Primary Care Physician: Tedra Senegal Other Clinician: Referring Physician: Tedra Senegal Treating Physician/Extender: Melburn Hake, Mitchell [name] Weeks in Treatment: 6 Vital Signs Time Taken: 11:07 Temperature (F): 97.4 Height (in): 75 Pulse (bpm): 122 Weight (lbs): 272 Respiratory Rate (breaths/min): 16 Body Mass Index (BMI): 34 Blood Pressure (mmHg): 143/95 Reference Range: 80 - 120 mg / dl Electronic Signature(s) Signed: 02/17/2016 5:11:35 PM By: Montey Hora Entered By: Montey Hora on 02/17/2016 11:09:24

## 2016-02-18 NOTE — Progress Notes (Signed)
AZRA, HARTSEL ([id number]) Visit Report for 02/17/2016 Chief Complaint Document Details Patient Name: Mitchell [name], FORBUSH Date of Service: 02/17/2016 10:45 AM Medical Record Number: [id number] Patient Account Number: [id number] Date of Birth/Sex: Jun 18, 1934 (81 y.o. Male) Treating RN: Montey Hora Primary Care Physician: Tedra Senegal Other Clinician: Referring Physician: Tedra Senegal Treating Physician/Extender: Sharalyn Ink in Treatment: 6 Information Obtained from: Patient Chief Complaint 01/28/16 81 year old man admitted to clinic for review of his surgical wound in his lumbar spine area Electronic Signature(s) Signed: 02/18/2016 1:37:38 AM By: Worthy Keeler PA-C Entered By: Worthy Keeler on 02/18/2016 00:37:46 RODEL, MAZZOLI ([id number]) -------------------------------------------------------------------------------- HPI Details Patient Name: Mitchell [name] Date of Service: 02/17/2016 10:45 AM Medical Record Number: [id number] Patient Account Number: [id number] Date of Birth/Sex: 1934/06/24 (81 y.o. Male) Treating RN: Montey Hora Primary Care Physician: Tedra Senegal Other Clinician: Referring Physician: Tedra Senegal Treating Physician/Extender: Melburn Hake, Kelsi Benham Weeks in Treatment: 6 History of Present Illness HPI Description: 12/31/15; this is a patient to was increasingly incapacitated over the last year with severe lumbar spinal stenosis and radiculopathy at L4-L5 L5-S1 with a large disc herniation at L2-L3. He had several spinal injections over the last year with no relief in his pain. On July 7 17 he had a redo decompression with fusion at L4-L5 L5-S1 and a laminectomy and microdiscectomy of L2-L3 with pedicle screw fixation L2-S1. Is not really clear to me at the time of this dictation as to the exact course of this wound. However he was discharged to rehabilitation at [facility] and it was very clear at the end of the stay here that there was an  open area for which she had a wound VAC for a period of time. A culture on 10/29/15 showed both Pseudomonas and Klebsiella. He was discharged on ciprofloxacin and he still remains on that currently. He has home health going into his home now and doing iodoform packing. He has a follow-up with his neurosurgeon Dr. Vertell Limber tomorrow. There is still moderate amount of drainage. However the patient denies fever or chills or excessive pain. He is working hard with physical therapy in order to gain ambulatory status. He has a right foot drop for which he uses an AFO brace. As far as I can tell he has not had any advanced imaging of the low back although he apparently a has had plain x-rays in the neurosurgeon's office. No recent cultures. 01/14/16; culture from 2 weeks ago was negative. He went to sees Dr. Vertell Limber last week who is still was reluctant to proceed with any more imaging studies, they follow with him on 2023/02/14. They're using Aquacel Ag packing but still having copious amounts of clear yellowish drainage. This is being changed once a day. He has not been systemically unwell, no pain no fever and really no complaints other than the periwound is itchy 01/28/16 at this point in time patient has had a follow-up visit with Dr. Melven Sartorius neurosurgeon who is still not recommending any further advanced imaging studies at this point in time. We have been using Aquacel Ag packing although Dr. Vertell Limber didn't want him not to pack this as tightly. This is being changed daily. He has no signs or symptoms of systemic infection and his main issue is that he has a rash and itching around the periwound region. They have been using over-the-counter antifungal cream nothing prescribed up to this point. Fortunately he is not having a significant amount of pain at this point in  time and rates his discomfort to be a 1-2 out of 10 02/04/16 no major change since I last saw this wound 3 weeks ago. Depth is 6.3 cm although  his wife reports that there is much less drainage he has completed antibiotics although I don't know that we prescribed them. He had some irritation to the periwound and was given a combination of nystatin and triamcinolone apparently he developed severe irritation from this 02/17/16 this point in time patient seems to be doing well in regard to his back wound. He has had a follow- up with his neurosurgeon Dr. Vertell Limber who is pleased with how this is progressing. Fortunately he has no overall worsening symptoms and no interval signs or symptoms of infection. He tells me that the pain is also not as severe at this point. This is good news. his wife continues to perform the dressing changes at this point in time. JEFRI, PEZZA ([id number]) Electronic Signature(s) Signed: 02/18/2016 1:37:38 AM By: Worthy Keeler PA-C Entered By: Worthy Keeler on 02/18/2016 00:38:45 DWIJ, FOSHEE ([id number]) -------------------------------------------------------------------------------- Physical Exam Details Patient Name: Mitchell [name] Date of Service: 02/17/2016 10:45 AM Medical Record Number: [id number] Patient Account Number: [id number] Date of Birth/Sex: [birth year] (81 y.o. Male) Treating RN: Montey Hora Primary Care Physician: Tedra Senegal Other Clinician: Referring Physician: Tedra Senegal Treating Physician/Extender: Melburn Hake, Juanda Luba Weeks in Treatment: 6 Constitutional patient is hypertensive.. pulse regular and within target range for patient.[name] respirations regular, non-labored and within target range for patient.[name] temperature within target range for patient.. Well-nourished and well- hydrated in no acute distress. Respiratory normal breathing without difficulty. Psychiatric this patient is able to make decisions and demonstrates good insight into disease process. Alert and Oriented x 3. pleasant and cooperative. Notes Currently the tunneling appears to be slightly improved compared  to last evaluation. The periwound region has also improved with the use of over-the-ccounter zinc oxide which his wife has been utilizing currently. Electronic Signature(s) Signed: 02/18/2016 1:37:38 AM By: Worthy Keeler PA-C Entered By: Worthy Keeler on 02/18/2016 00:39:58 CABOT, WINDHORST ([id number]) -------------------------------------------------------------------------------- Physician Orders Details Patient Name: Mitchell [name] Date of Service: 02/17/2016 10:45 AM Medical Record Number: [id number] Patient Account Number: [id number] Date of Birth/Sex: 03-16-1935 (81 y.o. Male) Treating RN: Montey Hora Primary Care Physician: Tedra Senegal Other Clinician: Referring Physician: Tedra Senegal Treating Physician/Extender: Sharalyn Ink in Treatment: 6 Verbal / Phone Orders: Yes Clinician: Montey Hora Read Back and Verified: Yes Diagnosis Coding Wound Cleansing Wound #1 Midline Back o Clean wound with Normal Saline. o May Shower, gently pat wound dry prior to applying new dressing. Primary Wound Dressing Wound #1 Midline Back o Iodoform packing Gauze Secondary Dressing Wound #1 Midline Back o Boardered Foam Dressing - Allevyn Life or Equal to protect surrounding skin from tape damage. o Drawtex - HHRN please provide this for patient Dressing Change Frequency Wound #1 Midline Back o Change dressing every day. Follow-up Appointments Wound #1 Midline Back o Return Appointment in 1 week. Home Health Wound #1 [place] Visits - [facility] - Please provide hypafix tape, bordered foam dressings, gauze, Drawtex, abd pads, and iodoform packing strip for patient o Home Health Nurse may visit PRN to address patientos wound care needs. o FACE TO FACE ENCOUNTER: MEDICARE and MEDICAID PATIENTS: I certify that this patient is under my care and that I had a face-to-face encounter that meets the physician face-to-face encounter  requirements with this patient on this date.  The encounter with the patient was in whole or in part for the following MEDICAL CONDITION: (primary reason for [place]) MEDICAL NECESSITY: I certify, that based on my findings, NURSING services are a medically necessary home health service. HOME BOUND STATUS: I certify that my clinical findings support that this patient is homebound (i.e., Due to illness or injury, pt requires aid of supportive devices such as crutches, cane, wheelchairs, walkers, the use of special transportation or the assistance of another person to leave their place of residence. There is a Chism, Damarcus W. ([id number]) normal inability to leave the home and doing so requires considerable and taxing effort. Other absences are for medical reasons / religious services and are infrequent or of short duration when for other reasons). o If current dressing causes regression in wound condition, may D/C ordered dressing product/s and apply Normal Saline Moist Dressing daily until next [place] / Other MD appointment. [place] of regression in wound condition at [phone]. o Please direct any NON-WOUND related issues/requests for orders to patient's Primary Care Physician Electronic Signature(s) Signed: 02/17/2016 5:11:35 PM By: Montey Hora Signed: 02/18/2016 1:37:38 AM By: Worthy Keeler PA-C Entered By: Montey Hora on 02/17/2016 11:48:40 ROMY, HALLMARK ([id number]) -------------------------------------------------------------------------------- Problem List Details Patient Name: Mitchell [name] Date of Service: 02/17/2016 10:45 AM Medical Record Number: [id number] Patient Account Number: [id number] Date of Birth/Sex: 1934/11/12 (81 y.o. Male) Treating RN: Montey Hora Primary Care Physician: Tedra Senegal Other Clinician: Referring Physician: Tedra Senegal Treating Physician/Extender: Melburn Hake, Arelys Glassco Weeks in Treatment:  6 Active Problems ICD-10 Encounter Code Description Active Date Diagnosis T81.31XD Disruption of external operation (surgical) wound, not 12/31/2015 Yes elsewhere classified, subsequent encounter L98.429 Non-pressure chronic ulcer of back with unspecified 12/31/2015 Yes severity M48.06 Spinal stenosis, lumbar region 12/31/2015 Yes Inactive Problems Resolved Problems Electronic Signature(s) Signed: 02/18/2016 1:37:38 AM By: Worthy Keeler PA-C Entered By: Worthy Keeler on 02/18/2016 00:34:09 Mitchell [name] ([id number]) -------------------------------------------------------------------------------- Progress Note Details Patient Name: Mitchell [name] Date of Service: 02/17/2016 10:45 AM Medical Record Number: [id number] Patient Account Number: [id number] Date of Birth/Sex: [birth year] (81 y.o. Male) Treating RN: Montey Hora Primary Care Physician: Tedra Senegal Other Clinician: Referring Physician: Tedra Senegal Treating Physician/Extender: Melburn Hake, Salif Tay Weeks in Treatment: 6 Subjective Chief Complaint Information obtained from Patient 01/28/16 80 year old man admitted to clinic for review of his surgical wound in his lumbar spine area History of Present Illness (HPI) 12/31/15; this is a patient to was increasingly incapacitated over the last year with severe lumbar spinal stenosis and radiculopathy at L4-L5 L5-S1 with a large disc herniation at L2-L3. He had several spinal injections over the last year with no relief in his pain. On July 7 17 he had a redo decompression with fusion at L4-L5 L5-S1 and a laminectomy and microdiscectomy of L2-L3 with pedicle screw fixation L2-S1. Is not really clear to me at the time of this dictation as to the exact course of this wound. However he was discharged to rehabilitation at [name] and it was very clear at the end of the stay here that there was an open area for which she had a wound VAC for a period of time. A culture on  10/29/15 showed both Pseudomonas and Klebsiella. He was discharged on ciprofloxacin and he still remains on that currently. He has home health going into his home now and doing iodoform packing. He has a follow-up with his neurosurgeon Dr. Vertell Limber tomorrow. There is still moderate amount  of drainage. However the patient denies fever or chills or excessive pain. He is working hard with physical therapy in order to gain ambulatory status. He has a right foot drop for which he uses an AFO brace. As far as I can tell he has not had any advanced imaging of the low back although he apparently a has had plain x-rays in the neurosurgeon's office. No recent cultures. 01/14/16; culture from 2 weeks ago was negative. He went to sees Dr. Vertell Limber last week who is still was reluctant to proceed with any more imaging studies, they follow with him on Feb 04, 2023. They're using Aquacel Ag packing but still having copious amounts of clear yellowish drainage. This is being changed once a day. He has not been systemically unwell, no pain no fever and really no complaints other than the periwound is itchy 01/28/16 at this point in time patient has had a follow-up visit with Dr. Melven Sartorius neurosurgeon who is still not recommending any further advanced imaging studies at this point in time. We have been using Aquacel Ag packing although Dr. Vertell Limber didn't want him not to pack this as tightly. This is being changed daily. He has no signs or symptoms of systemic infection and his main issue is that he has a rash and itching around the periwound region. They have been using over-the-counter antifungal cream nothing prescribed up to this point. Fortunately he is not having a significant amount of pain at this point in time and rates his discomfort to be a 1-2 out of 10 02/04/16 no major change since I last saw this wound 3 weeks ago. Depth is 6.3 cm although his wife reports that there is much less drainage he has completed  antibiotics although I don't know that we prescribed them. He had some irritation to the periwound and was given a combination of nystatin and triamcinolone apparently he developed severe irritation from this 02/17/16 this point in time patient seems to be doing well in regard to his back wound. He has had a followELIKAI, [place]. ([id number]) up with his neurosurgeon Dr. Vertell Limber who is pleased with how this is progressing. Fortunately he has no overall worsening symptoms and no interval signs or symptoms of infection. He tells me that the pain is also not as severe at this point. This is good news. his wife continues to perform the dressing changes at this point in time. Objective Constitutional patient is hypertensive.. pulse regular and within target range for patient.[name] respirations regular, non-labored and within target range for patient.[name] temperature within target range for patient.. Well-nourished and well- hydrated in no acute distress. Vitals Time Taken: 11:07 AM, Height: 75 in, Weight: 272 lbs, BMI: 34, Temperature: 97.4 F, Pulse: 122 bpm, Respiratory Rate: 16 breaths/min, Blood Pressure: 143/95 mmHg. Respiratory normal breathing without difficulty. Psychiatric this patient is able to make decisions and demonstrates good insight into disease process. Alert and Oriented x 3. pleasant and cooperative. General Notes: Currently the tunneling appears to be slightly improved compared to last evaluation. The periwound region has also improved with the use of over-the-ccounter zinc oxide which his wife has been utilizing currently. Integumentary (Hair, Skin) Wound #1 status is Open. Original cause of wound was Surgical Injury. The wound is located on the Midline Back. The wound measures 0.6cm length x 0.2cm width x 5.7cm depth; 0.094cm^2 area and 0.537cm^3 volume. The wound is limited to skin breakdown. There is no tunneling or undermining noted. There is a large amount of purulent  drainage noted. The wound margin is flat and intact. There is large (67-100%) red granulation within the wound bed. There is no necrotic tissue within the wound bed. The periwound skin appearance exhibited: Excoriation, Moist, Ecchymosis. The periwound skin appearance did not exhibit: Callus, Crepitus, Fluctuance, Friable, Induration, Localized Edema, Rash, Scarring, Dry/Scaly, Maceration, Atrophie Blanche, Cyanosis, Hemosiderin Staining, Mottled, Pallor, Rubor, Erythema. Periwound temperature was noted as No Abnormality. Assessment JUMARION, BARRECA. ([id number]) Active Problems ICD-10 T81.31XD - Disruption of external operation (surgical) wound, not elsewhere classified, subsequent encounter L98.429 - Non-pressure chronic ulcer of back with unspecified severity M48.06 - Spinal stenosis, lumbar region Diagnoses ICD-10 T81.31XD: Disruption of external operation (surgical) wound, not elsewhere classified, subsequent encounter L98.429: Non-pressure chronic ulcer of back with unspecified severity M48.06: Spinal stenosis, lumbar region Plan Wound Cleansing: Wound #1 Midline Back: Clean wound with Normal Saline. May Shower, gently pat wound dry prior to applying new dressing. Primary Wound Dressing: Wound #1 Midline Back: Iodoform packing Gauze Secondary Dressing: Wound #1 Midline Back: Boardered Foam Dressing - Allevyn Life or Equal to protect surrounding skin from tape damage. Drawtex - HHRN please provide this for patient Dressing Change Frequency: Wound #1 Midline Back: Change dressing every day. Follow-up Appointments: Wound #1 Midline Back: Return Appointment in 1 week. Home Health: Wound #1 Midline Back: Continue Home Health Visits - [facility] - Please provide hypafix tape, bordered foam dressings, gauze, Drawtex, abd pads, and iodoform packing strip for patient Home Health Nurse may visit PRN to address patient s wound care needs. FACE TO FACE ENCOUNTER: MEDICARE and  MEDICAID PATIENTS: I certify that this patient is under my care and that I had a face-to-face encounter that meets the physician face-to-face encounter requirements with this patient on this date. The encounter with the patient was in whole or in part for the following MEDICAL CONDITION: (primary reason for [place]) MEDICAL NECESSITY: I certify, that based on my findings, NURSING services are a medically necessary home health service. HOME BOUND STATUS: I certify that my clinical findings support that this patient is homebound (i.e., Due to EDRIC, BIERCE. ([id number]) illness or injury, pt requires aid of supportive devices such as crutches, cane, wheelchairs, walkers, the use of special transportation or the assistance of another person to leave their place of residence. There is a normal inability to leave the home and doing so requires considerable and taxing effort. Other absences are for medical reasons / religious services and are infrequent or of short duration when for other reasons). If current dressing causes regression in wound condition, may D/C ordered dressing product/s and apply Normal Saline Moist Dressing daily until next [place] / Other MD appointment. [place] of regression in wound condition at [phone]. Please direct any NON-WOUND related issues/requests for orders to patient's Primary Care Physician Follow-Up Appointments: A follow-up appointment should be scheduled. A Patient Clinical Summary of Care was provided to HD Currently on the recommend that we continue with the iodoform gauze packing which does seem to be doing decently well in my opinion the tunnel has improved over his last evaluation 2 weeks ago. He and his wife are in agreement with this plan. In the interim if anything worsens he will contact the office and let us know. All questions and concerns were answered during the office visit to the best my ability we  will see him back for a follow-up visit in 2 weeks. Electronic Signature(s) Signed: 02/18/2016 1:37:38 AM By: Worthy Keeler PA-C Entered  By: Worthy Keeler on 02/18/2016 00:41:15 Mitchell [name] ([id number]) -------------------------------------------------------------------------------- SuperBill Details Patient Name: Mitchell [name] Date of Service: 02/17/2016 Medical Record Number: [id number] Patient Account Number: [id number] Date of Birth/Sex: 04-May-1934 (81 y.o. Male) Treating RN: Montey Hora Primary Care Physician: Tedra Senegal Other Clinician: Referring Physician: Tedra Senegal Treating Physician/Extender: Melburn Hake, Hydie Langan Weeks in Treatment: 6 Diagnosis Coding ICD-10 Codes Code Description Disruption of external operation (surgical) wound, not elsewhere classified, subsequent T81.31XD encounter L98.429 Non-pressure chronic ulcer of back with unspecified severity M48.06 Spinal stenosis, lumbar region Facility Procedures CPT4 Code: [id number] Description: [phone] - WOUND CARE VISIT-LEV 2 EST PT Modifier: Quantity: 1 Physician Procedures CPT4: Description Modifier Quantity Code [id number] - WC PHYS LEVEL 3 - EST PT 1 ICD-10 Description Diagnosis T81.31XD Disruption of external operation (surgical) wound, not elsewhere classified, subsequent encounter L98.429 Non-pressure chronic ulcer  of back with unspecified severity M48.06 Spinal stenosis, lumbar region Electronic Signature(s) Signed: 02/18/2016 1:37:38 AM By: Worthy Keeler PA-C Entered By: Worthy Keeler on 02/18/2016 00:40:18

## 2016-02-20 DIAGNOSIS — B965 Pseudomonas (aeruginosa) (mallei) (pseudomallei) as the cause of diseases classified elsewhere: Secondary | ICD-10-CM | POA: Diagnosis not present

## 2016-02-20 DIAGNOSIS — M4316 Spondylolisthesis, lumbar region: Secondary | ICD-10-CM | POA: Diagnosis not present

## 2016-02-20 DIAGNOSIS — M5116 Intervertebral disc disorders with radiculopathy, lumbar region: Secondary | ICD-10-CM | POA: Diagnosis not present

## 2016-02-20 DIAGNOSIS — T814XXD Infection following a procedure, subsequent encounter: Secondary | ICD-10-CM | POA: Diagnosis not present

## 2016-02-20 DIAGNOSIS — M21371 Foot drop, right foot: Secondary | ICD-10-CM | POA: Diagnosis not present

## 2016-02-20 DIAGNOSIS — B961 Klebsiella pneumoniae [K. pneumoniae] as the cause of diseases classified elsewhere: Secondary | ICD-10-CM | POA: Diagnosis not present

## 2016-02-23 DIAGNOSIS — M545 Low back pain: Secondary | ICD-10-CM | POA: Diagnosis not present

## 2016-02-24 ENCOUNTER — Encounter: Payer: Self-pay | Admitting: Internal Medicine

## 2016-02-24 ENCOUNTER — Ambulatory Visit
Admission: RE | Admit: 2016-02-24 | Discharge: 2016-02-24 | Disposition: A | Discharge status: Home / Self Care | Payer: Medicare Other | Source: Ambulatory Visit | Attending: Internal Medicine | Admitting: Internal Medicine

## 2016-02-24 ENCOUNTER — Telehealth: Payer: Self-pay | Admitting: *Deleted

## 2016-02-24 ENCOUNTER — Ambulatory Visit (INDEPENDENT_AMBULATORY_CARE_PROVIDER_SITE_OTHER): Payer: Medicare Other | Admitting: Internal Medicine

## 2016-02-24 VITALS — BP 110/80 | HR 120 | Temp 97.6°F | Wt 275.0 lb

## 2016-02-24 DIAGNOSIS — I2510 Atherosclerotic heart disease of native coronary artery without angina pectoris: Secondary | ICD-10-CM | POA: Diagnosis not present

## 2016-02-24 DIAGNOSIS — R062 Wheezing: Secondary | ICD-10-CM

## 2016-02-24 DIAGNOSIS — M21371 Foot drop, right foot: Secondary | ICD-10-CM | POA: Diagnosis not present

## 2016-02-24 DIAGNOSIS — R0602 Shortness of breath: Secondary | ICD-10-CM | POA: Diagnosis not present

## 2016-02-24 DIAGNOSIS — R609 Edema, unspecified: Secondary | ICD-10-CM | POA: Diagnosis not present

## 2016-02-24 DIAGNOSIS — I10 Essential (primary) hypertension: Secondary | ICD-10-CM | POA: Diagnosis not present

## 2016-02-24 DIAGNOSIS — Z8709 Personal history of other diseases of the respiratory system: Secondary | ICD-10-CM

## 2016-02-24 DIAGNOSIS — R9389 Abnormal findings on diagnostic imaging of other specified body structures: Secondary | ICD-10-CM

## 2016-02-24 LAB — CBC WITH DIFFERENTIAL/PLATELET
Basophils Absolute: 0 cells/uL (ref 0–200)
Basophils Relative: 0 %
Eosinophils Absolute: 87 cells/uL (ref 15–500)
Eosinophils Relative: 1 %
HCT: 40.6 % (ref 38.5–50.0)
Hemoglobin: 12.6 g/dL — ABNORMAL LOW (ref 13.2–17.1)
Lymphocytes Relative: 23 %
Lymphs Abs: 2001 cells/uL (ref 850–3900)
MCH: 27.7 pg (ref 27.0–33.0)
MCHC: 31 g/dL — ABNORMAL LOW (ref 32.0–36.0)
MCV: 89.2 fL (ref 80.0–100.0)
MPV: 9 fL (ref 7.5–12.5)
Monocytes Absolute: 783 cells/uL (ref 200–950)
Monocytes Relative: 9 %
Neutro Abs: 5829 cells/uL (ref 1500–7800)
Neutrophils Relative %: 67 %
Platelets: 295 10*3/uL (ref 140–400)
RBC: 4.55 MIL/uL (ref 4.20–5.80)
RDW: 15.8 % — ABNORMAL HIGH (ref 11.0–15.0)
WBC: 8.7 10*3/uL (ref 3.8–10.8)

## 2016-02-24 MED ORDER — FUROSEMIDE 40 MG PO TABS: 20.0000 mg | ORAL_TABLET | ORAL | 1 refills | Status: DC

## 2016-02-24 MED ORDER — LEVALBUTEROL TARTRATE 45 MCG/ACT IN AERO: 2.0000 | INHALATION_SPRAY | Freq: Four times a day (QID) | RESPIRATORY_TRACT | Status: DC | PRN

## 2016-02-24 NOTE — Progress Notes (Signed)
   Subjective:    Patient ID: Mitchell [name], male    DOB: 1934-07-04, 81 y.o.   MRN: [id number]  HPI   Still with cough and wheezing.  Repeat CXR pending.   Doing well with lumbar incision. Says it is finally closing. Still seeing Dr. Dellia Nims at [facility].Sees Dr. Vertell Limber next week.  On Lasix for dependent edema. Has urinary frequency with it. Continues to wear right leg brace for foot drop. That is the same ankle that he fractured with a fall. He has more dependent edema in the right lower extremity than the left.      Review of Systems complaining of some intermittent wheezing. Once again went over the use of Xopenex inhaler for acute management with wheezing and Symbicort as maintenance management.     Objective:   Physical Exam  Skin warm and dry. Nodes none. Chest clear. Cardiac exam regular rate and rhythm. He has 1+ pitting edema of the right lower extremity and trace pitting edema of the left lower extremity      Assessment & Plan:  History of bronchospasm  History of abnormal chest x-ray-in mid October there was a streaky infiltrate in left  base that was concerning. We are following up on this today with repeat chest x-ray which is pending.  Dependent edema-treated with Lasix. Recent potassium was normal.  Plan: He'll have CBC with diff  and BNP today. We'll notify them of chest x-ray results and further instructions to follow after review of labs and chest x-ray.  He are to has follow-up appointment December 14 on book.

## 2016-02-24 NOTE — Telephone Encounter (Signed)
Wife is aware of xray results. Next appointment with Dr Renold Genta cancelled.  Referral placed with [facility] Dr Melvyn Novas 02/27/16 .

## 2016-02-24 NOTE — Patient Instructions (Signed)
Chest x-ray results pending as well as lab results. Use Xopenex for wheezing acutely and Symbicort as maintenance inhaler daily

## 2016-02-25 DIAGNOSIS — M545 Low back pain: Secondary | ICD-10-CM | POA: Diagnosis not present

## 2016-02-25 LAB — BRAIN NATRIURETIC PEPTIDE: Brain Natriuretic Peptide: 148 pg/mL — ABNORMAL HIGH (ref <100)

## 2016-02-27 ENCOUNTER — Ambulatory Visit (INDEPENDENT_AMBULATORY_CARE_PROVIDER_SITE_OTHER): Payer: Medicare Other | Admitting: Internal Medicine

## 2016-02-27 ENCOUNTER — Other Ambulatory Visit (INDEPENDENT_AMBULATORY_CARE_PROVIDER_SITE_OTHER): Payer: Medicare Other

## 2016-02-27 ENCOUNTER — Encounter: Payer: Self-pay | Admitting: Internal Medicine

## 2016-02-27 VITALS — BP 116/84 | HR 119 | Ht 75.0 in | Wt 275.0 lb

## 2016-02-27 DIAGNOSIS — J45991 Cough variant asthma: Secondary | ICD-10-CM

## 2016-02-27 DIAGNOSIS — M545 Low back pain: Secondary | ICD-10-CM | POA: Diagnosis not present

## 2016-02-27 DIAGNOSIS — R0600 Dyspnea, unspecified: Secondary | ICD-10-CM

## 2016-02-27 DIAGNOSIS — I2510 Atherosclerotic heart disease of native coronary artery without angina pectoris: Secondary | ICD-10-CM

## 2016-02-27 LAB — NITRIC OXIDE: Nitric Oxide: 37

## 2016-02-27 LAB — TSH: TSH: 3 u[IU]/mL (ref 0.35–4.50)

## 2016-02-27 MED ORDER — BUDESONIDE-FORMOTEROL FUMARATE 80-4.5 MCG/ACT IN AERO: INHALATION_SPRAY | RESPIRATORY_TRACT | 11 refills | Status: AC

## 2016-02-27 NOTE — Progress Notes (Addendum)
Subjective:    Patient ID: Mitchell [name], male    DOB: [birth year],    MRN: [id number]  HPI   61 yowm never smoker asthma entire life typically active in fall with no need for meds in between baseline walking with cane and limited by orthopedic issues then fell May 2017 and much more limited then sept 2017 new cough/wheeze seen on 01/05/16 by Dr Renold Genta and started on dulera/symbicort 160 but no better so referred to pulmonary clinic 02/27/2016 by Dr   [name]  p transiently better on prednisone / zpak   02/27/2016 1st [facility] office visit/ [name]   Chief Complaint  Patient presents with  . Pulmonary Consult    Referred by Dr. Renold Genta. Pt states he has had PNA all of his life. He c/o cough and wheezing for the past month. His cough is prod with very minimal clear sputum.  His wheezing is worse first thing in the am and with exertion. He has minimal SOB.    Onset was abrupt in sept 2017 and persistent day > noct  Voice is always hoarse/ protonix with breakfast  Chronically   Cough worse p stirs but does not wake him up noct or in am  No obvious day to day or daytime variability or assoc excess/ purulent sputum or mucus plugs or hemoptysis or cp or chest tightness,  or overt sinus or hb symptoms. No unusual exp hx or h/o childhood pna/ asthma or knowledge of premature birth.  Sleeping ok without nocturnal  or early am exacerbation  of respiratory  c/o's or need for noct saba. Also denies any obvious fluctuation of symptoms with weather or environmental changes or other aggravating or alleviating factors except as outlined above   Current Medications, Allergies, Complete Past Medical History, Past Surgical History, Family History, and Social History were reviewed in [employer] record.     Review of Systems  Constitutional: Negative for activity change, appetite change, chills, fever and unexpected weight change.  HENT: Positive for congestion. Negative for  dental problem, postnasal drip, rhinorrhea, sneezing, sore throat, trouble swallowing and voice change.   Eyes: Negative for visual disturbance.  Respiratory: Positive for cough and shortness of breath. Negative for choking.   Cardiovascular: Positive for leg swelling. Negative for chest pain.  Gastrointestinal: Negative for abdominal pain, nausea and vomiting.  Genitourinary: Negative for difficulty urinating.  Musculoskeletal: Negative for arthralgias.  Skin: Negative for rash.  Psychiatric/Behavioral: Negative for behavioral problems and confusion.       Objective:   Physical Exam  W/c based wm nad  Wt Readings from Last 3 Encounters:  02/27/16 275 lb (124.7 kg)  02/24/16 275 lb (124.7 kg)  01/05/16 272 lb (123.4 kg)    Vital signs reviewed - Note on arrival 02 sats  93% on RA     HEENT: nl dentition, turbinates, and oropharynx. Nl external ear canals without cough reflex   NECK :  without JVD/Nodes/TM/ nl carotid upstrokes bilaterally   LUNGS: no acc muscle use,  Nl contour chest which is clear to A and P bilaterally without cough on insp or exp maneuvers   CV:  RRR  no s3 or murmur or increase in P2, no edema   ABD:  soft and nontender with nl inspiratory excursion in the supine position. No bruits or organomegaly, bowel sounds nl  MS:  Nl gait/ ext warm without deformities, calf tenderness, cyanosis or clubbing No obvious joint restrictions   SKIN: warm  and dry without lesions    NEURO:  alert, approp, nl sensorium with  no motor deficits     I personally reviewed images and agree with radiology impression as follows:  CXR:   02/24/16 Relatively similar appearance of the lungs compared to the prior plain film with coarsened interstitial markings most prominent at the lung bases, without evidence of lobar pneumonia  CBC 02/24/16   Eos absolute 87 cells/uL Labs ordered 02/27/2016   Allergy profile   Lab Results  Component Value Date   TSH 3.00 02/27/2016           Assessment & Plan:

## 2016-02-27 NOTE — Patient Instructions (Addendum)
Pantoprazole (protonix) 40 mg  Take  30-60 min before first meal of the day and Pepcid (famotidine)  20 mg one @  bedtime until return to office - this is the best way to tell whether stomach acid is contributing to your problem.    Work on inhaler technique:  relax and gently blow all the way out then take a nice smooth deep breath back in, triggering the inhaler at same time you start breathing in.  Hold for up to 5 seconds if you can. Blow out thru nose. Rinse and gargle with water when done  Plan A = Automatic =  Change to Symbicort 80 Take 2 puffs first thing in am and then another 2 puffs about 12 hours later.    Plan B = Backup Only use your albuterol(xopenex)  as a rescue medication to be used if you can't catch your breath by resting or doing a relaxed purse lip breathing pattern.  - The less you use it, the better it will work when you need it. - Ok to use the inhaler up to 2 puffs  every 4 hours if you must but call for appointment if use goes up over your usual need - Don't leave home without it !!  (think of it like the spare tire for your car)   GERD (REFLUX)  is an extremely common cause of respiratory symptoms just like yours , many times with no obvious heartburn at all.    It can be treated with medication, but also with lifestyle changes including elevation of the head of your bed (ideally with 6 inch  bed blocks),  Smoking cessation, avoidance of late meals, excessive alcohol, and avoid fatty foods, chocolate, peppermint, colas, red wine, and acidic juices such as orange juice.  NO MINT OR MENTHOL PRODUCTS SO NO COUGH DROPS  USE SUGARLESS CANDY INSTEAD (Jolley ranchers or Stover's or Life Savers) or even ice chips will also do - the key is to swallow to prevent all throat clearing. NO OIL BASED VITAMINS - use powdered substitutes.     Please remember to go to the lab  department downstairs for your tests - we will call you with the results when they are available.  Please  schedule a follow up office visit in 4 weeks, sooner if needed

## 2016-02-27 NOTE — Assessment & Plan Note (Addendum)
Body mass index is 34.37  Lab Results  Component Value Date   TSH 3.00 02/27/2016     Contributing to gerd tendency/ doe/ restrictive  findings on spirometry/ reviewed the need and the process to achieve and maintain neg calorie balance > defer f/u primary care including intermittently monitoring thyroid status    Contributing to gerd tendency/ doe/reviewed the need and the process to achieve and maintain neg calorie balance > defer f/u primary care including intermittently monitoring thyroid status

## 2016-02-28 NOTE — Assessment & Plan Note (Signed)
Difficult to assess since not fully mobile so will rx for asthma first and regroup if not improved with rx

## 2016-02-28 NOTE — Assessment & Plan Note (Addendum)
-   FENO 02/27/2016  =   37  On symbiocort 160 but poor hfa technique  - Spirometry 02/27/2016  FEV1 1.82 (51%)  Ratio 74 with minimal curvature   02/27/2016  After extensive coaching HFA effectiveness =    50% > change to symb 80 bid and max gerd rx  Main ddx is between asthma and upper airway cough syndrome but many pts have combinations of both and unless both are addressed the symptoms will persist.  The absence of cough during sleep favors uacs, but the relatively high FENO favors asthma   Rec lower the strength of symbicort as the higher one can irritate the upper airway and try harder to learn hfa and if not doing better add spacer next   At same time will max rx for gerd and await allergy profile   Total time devoted to counseling  = 35/57m review case with pt/ discussion of options/alternatives/ personally creating written instructions  in presence of pt  then going over those specific  Instructions directly with the pt including how to use all of the meds but in particular covering each new medication in detail and the difference between the maintenance/automatic meds and the prns using an action plan format for the latter.

## 2016-03-01 DIAGNOSIS — T8189XD Other complications of procedures, not elsewhere classified, subsequent encounter: Secondary | ICD-10-CM | POA: Diagnosis not present

## 2016-03-01 DIAGNOSIS — I10 Essential (primary) hypertension: Secondary | ICD-10-CM | POA: Diagnosis not present

## 2016-03-01 DIAGNOSIS — M545 Low back pain: Secondary | ICD-10-CM | POA: Diagnosis not present

## 2016-03-01 DIAGNOSIS — Z6835 Body mass index (BMI) 35.0-35.9, adult: Secondary | ICD-10-CM | POA: Diagnosis not present

## 2016-03-01 LAB — RESPIRATORY ALLERGY PROFILE REGION II ~~LOC~~
Allergen, A. alternata, m6: <0.1 kU/L
Allergen, C. Herbarum, M2: <0.1 kU/L
Allergen, Cedar tree, t12: <0.1 kU/L
Allergen, Comm Silver Birch, t9: <0.1 kU/L
Allergen, Cottonwood, t14: <0.1 kU/L
Allergen, D pternoyssinus,d7: 0.51 kU/L — ABNORMAL HIGH
Allergen, Mouse Urine Protein, e78: <0.1 kU/L
Allergen, Mulberry, t76: <0.1 kU/L
Allergen, Oak,t7: <0.1 kU/L
Allergen, P. notatum, m1: <0.1 kU/L
Aspergillus fumigatus, m3: <0.1 kU/L
Bermuda Grass: <0.1 kU/L
Box Elder IgE: <0.1 kU/L
Cat Dander: <0.1 kU/L
Cockroach: <0.1 kU/L
Common Ragweed: <0.1 kU/L
D. farinae: 0.48 kU/L — ABNORMAL HIGH
Dog Dander: <0.1 kU/L
Elm IgE: <0.1 kU/L
IgE (Immunoglobulin E), Serum: 137 kU/L — ABNORMAL HIGH (ref <115)
Johnson Grass: <0.1 kU/L
Pecan/Hickory Tree IgE: <0.1 kU/L
Rough Pigweed  IgE: <0.1 kU/L
Sheep Sorrel IgE: <0.1 kU/L
Timothy Grass: <0.1 kU/L

## 2016-03-01 NOTE — Progress Notes (Signed)
Spoke with pt and notified of results per Dr. Wert. Pt verbalized understanding and denied any questions. 

## 2016-03-02 ENCOUNTER — Encounter: Payer: Medicare Other | Attending: Internal Medicine | Admitting: Internal Medicine

## 2016-03-02 DIAGNOSIS — I10 Essential (primary) hypertension: Secondary | ICD-10-CM | POA: Diagnosis not present

## 2016-03-02 DIAGNOSIS — M1990 Unspecified osteoarthritis, unspecified site: Secondary | ICD-10-CM | POA: Insufficient documentation

## 2016-03-02 DIAGNOSIS — J45909 Unspecified asthma, uncomplicated: Secondary | ICD-10-CM | POA: Insufficient documentation

## 2016-03-02 DIAGNOSIS — M48061 Spinal stenosis, lumbar region without neurogenic claudication: Secondary | ICD-10-CM | POA: Insufficient documentation

## 2016-03-02 DIAGNOSIS — Y839 Surgical procedure, unspecified as the cause of abnormal reaction of the patient, or of later complication, without mention of misadventure at the time of the procedure: Secondary | ICD-10-CM | POA: Diagnosis not present

## 2016-03-02 DIAGNOSIS — T8131XD Disruption of external operation (surgical) wound, not elsewhere classified, subsequent encounter: Secondary | ICD-10-CM | POA: Diagnosis not present

## 2016-03-02 DIAGNOSIS — L98429 Non-pressure chronic ulcer of back with unspecified severity: Secondary | ICD-10-CM | POA: Diagnosis not present

## 2016-03-02 DIAGNOSIS — T8131XA Disruption of external operation (surgical) wound, not elsewhere classified, initial encounter: Secondary | ICD-10-CM | POA: Diagnosis not present

## 2016-03-03 DIAGNOSIS — M545 Low back pain: Secondary | ICD-10-CM | POA: Diagnosis not present

## 2016-03-03 NOTE — Progress Notes (Addendum)
Mitchell, [name] ([id number]) Visit Report for 03/02/2016 Chief Complaint Document Details Patient Name: Mitchell [name], Mitchell [name] Date of Service: 03/02/2016 10:00 AM Medical Record Patient Account Number: [id number] Number: Treating RN: Montey Hora October 04, 1934 (81 y.o. Other Clinician: Date of Birth/Sex: Male) Treating Kiley Torrence Primary Care Physician: Tedra Senegal Physician/Extender: G Referring Physician: Assunta Gambles in Treatment: 8 Information Obtained from: Patient Chief Complaint 01/28/16 81 year old man admitted to clinic for review of his surgical wound in his lumbar spine area Electronic Signature(s) Signed: 03/02/2016 4:42:06 PM By: Linton Ham MD Entered By: Linton Ham on 03/02/2016 12:14:53 Mitchell [name] ([id number]) -------------------------------------------------------------------------------- HPI Details Patient Name: Mitchell [name] Date of Service: 03/02/2016 10:00 AM Medical Record Patient Account Number: [id number] Number: Treating RN: Montey Hora 29-Jun-1934 (81 y.o. Other Clinician: Date of Birth/Sex: Male) Treating Tonnya Garbett Primary Care Physician: Tedra Senegal Physician/Extender: G Referring Physician: Tedra Senegal Weeks in Treatment: 8 History of Present Illness HPI Description: 12/31/15; this is a patient to was increasingly incapacitated over the last year with severe lumbar spinal stenosis and radiculopathy at L4-L5 L5-S1 with a large disc herniation at L2-L3. He had several spinal injections over the last year with no relief in his pain. On July 7 17 he had a redo decompression with fusion at L4-L5 L5-S1 and a laminectomy and microdiscectomy of L2-L3 with pedicle screw fixation L2-S1. Is not really clear to me at the time of this dictation as to the exact course of this wound. However he was discharged to rehabilitation at [name] and it was very clear at the end of the stay here that there  was an open area for which she had a wound VAC for a period of time. A culture on 10/29/15 showed both Pseudomonas and Klebsiella. He was discharged on ciprofloxacin and he still remains on that currently. He has home health going into his home now and doing iodoform packing. He has a follow-up with his neurosurgeon Dr. Vertell Limber tomorrow. There is still moderate amount of drainage. However the patient denies fever or chills or excessive pain. He is working hard with physical therapy in order to gain ambulatory status. He has a right foot drop for which he uses an AFO brace. As far as I can tell he has not had any advanced imaging of the low back although he apparently a has had plain x-rays in the neurosurgeon's office. No recent cultures. 01/14/16; culture from 2 weeks ago was negative. He went to sees Dr. Vertell Limber last week who is still was reluctant to proceed with any more imaging studies, they follow with him on Jan 30, 2023. They're using Aquacel Ag packing but still having copious amounts of clear yellowish drainage. This is being changed once a day. He has not been systemically unwell, no pain no fever and really no complaints other than the periwound is itchy 01/28/16 at this point in time patient has had a follow-up visit with Dr. Melven Sartorius neurosurgeon who is still not recommending any further advanced imaging studies at this point in time. We have been using Aquacel Ag packing although Dr. Vertell Limber didn't want him not to pack this as tightly. This is being changed daily. He has no signs or symptoms of systemic infection and his main issue is that he has a rash and itching around the periwound region. They have been using over-the-counter antifungal cream nothing prescribed up to this point. Fortunately he is not having a significant amount of pain at this point in time and  rates his discomfort to be a 1-2 out of 10 02/04/16 no major change since I last saw this wound 3 weeks ago. Depth is 6.3 cm  although his wife reports that there is much less drainage he has completed antibiotics although I don't know that we prescribed them. He had some irritation to the periwound and was given a combination of nystatin and triamcinolone apparently he developed severe irritation from this 02/17/16 this point in time patient seems to be doing well in regard to his back wound. He has had a follow- up with his neurosurgeon Dr. Vertell Limber who is pleased with how this is progressing. Fortunately he has no overall worsening symptoms and no interval signs or symptoms of infection. He tells me that the pain is also not as severe at this point. This is good news. his wife continues to perform the dressing changes at this point in time. Mitchell, [name] ([id number]) 03/02/16 surgical wound on the lower lumbar spine. Depth today is 5 cm down from 5.7 last week. This appears to be making gradual progress. There is no pain. Using iodoform packing gauze Electronic Signature(s) Signed: 03/02/2016 4:42:06 PM By: Linton Ham MD Entered By: Linton Ham on 03/02/2016 12:20:03 LO, ROSTON ([id number]) -------------------------------------------------------------------------------- Physical Exam Details Patient Name: Mitchell [name] Date of Service: 03/02/2016 10:00 AM Medical Record Patient Account Number: [id number] Number: Treating RN: Montey Hora 1934/09/01 (81 y.o. Other Clinician: Date of Birth/Sex: Male) Treating Brendt Dible, [place] Primary Care Physician: Tedra Senegal Physician/Extender: G Referring Physician: Tedra Senegal Weeks in Treatment: 8 Notes 03/02/16 slightly less depth than on previous evaluation 2 weeks ago 5 cm measured by myself and 4.7 by our intake nurse. Both of these are better than last week. There is still some drainage there is no surrounding tenderness or erythema Electronic Signature(s) Signed: 03/02/2016 4:42:06 PM By: Linton Ham MD Entered By: Linton Ham on 03/02/2016 12:17:29 Mitchell [name] ([id number]) -------------------------------------------------------------------------------- Physician Orders Details Patient Name: Mitchell [name] Date of Service: 03/02/2016 10:00 AM Medical Record Patient Account Number: [id number] Number: Treating RN: Montey Hora December 28, 1934 (81 y.o. Other Clinician: Date of Birth/Sex: Male) Treating Debborah Alonge Primary Care Physician: Tedra Senegal Physician/Extender: G Referring Physician: Assunta Gambles in Treatment: 8 Verbal / Phone Orders: Yes Clinician: Montey Hora Read Back and Verified: Yes Diagnosis Coding Wound Cleansing Wound #1 Midline Back o Clean wound with Normal Saline. o May Shower, gently pat wound dry prior to applying new dressing. Primary Wound Dressing Wound #1 Midline Back o Iodoform packing Gauze Secondary Dressing Wound #1 Midline Back o Boardered Foam Dressing - Allevyn Life or Equal to protect surrounding skin from tape damage. o Drawtex - HHRN please provide this for patient Dressing Change Frequency Wound #1 Midline Back o Change dressing every day. Follow-up Appointments Wound #1 Midline Back o Return Appointment in 2 weeks. Electronic Signature(s) Signed: 03/02/2016 4:35:41 PM By: Montey Hora Signed: 03/02/2016 4:42:06 PM By: Linton Ham MD Entered By: Montey Hora on 03/02/2016 10:40:05 JAWARA, SCHUCHART ([id number]) -------------------------------------------------------------------------------- Problem List Details Patient Name: Mitchell [name] Date of Service: 03/02/2016 10:00 AM Medical Record Patient Account Number: [id number] Number: Treating RN: Montey Hora [birth year] (81 y.o. Other Clinician: Date of Birth/Sex: Male) Treating Toria Monte Primary Care Physician: Tedra Senegal Physician/Extender: G Referring Physician: Assunta Gambles in Treatment: 8 Active  Problems ICD-10 Encounter Code Description Active Date Diagnosis T81.31XD Disruption of external operation (surgical) wound, not 12/31/2015 Yes elsewhere classified, subsequent encounter L98.429 Non-pressure chronic  ulcer of back with unspecified 12/31/2015 Yes severity M48.06 Spinal stenosis, lumbar region 12/31/2015 Yes Inactive Problems Resolved Problems Electronic Signature(s) Signed: 03/02/2016 4:42:06 PM By: Linton Ham MD Entered By: Linton Ham on 03/02/2016 12:14:27 Mitchell [name] ([id number]) -------------------------------------------------------------------------------- Progress Note Details Patient Name: Mitchell [name] Date of Service: 03/02/2016 10:00 AM Medical Record Patient Account Number: [id number] Number: Treating RN: Montey Hora 27-Jan-1935 (81 y.o. Other Clinician: Date of Birth/Sex: Male) Treating Glenetta Kiger Primary Care Physician: Tedra Senegal Physician/Extender: G Referring Physician: Assunta Gambles in Treatment: 8 Subjective Chief Complaint Information obtained from Patient 01/28/16 81 year old man admitted to clinic for review of his surgical wound in his lumbar spine area History of Present Illness (HPI) 12/31/15; this is a patient to was increasingly incapacitated over the last year with severe lumbar spinal stenosis and radiculopathy at L4-L5 L5-S1 with a large disc herniation at L2-L3. He had several spinal injections over the last year with no relief in his pain. On July 7 17 he had a redo decompression with fusion at L4-L5 L5-S1 and a laminectomy and microdiscectomy of L2-L3 with pedicle screw fixation L2-S1. Is not really clear to me at the time of this dictation as to the exact course of this wound. However he was discharged to rehabilitation at [facility] and it was very clear at the end of the stay here that there was an open area for which she had a wound VAC for a period of time. A culture on 10/29/15  showed both Pseudomonas and Klebsiella. He was discharged on ciprofloxacin and he still remains on that currently. He has home health going into his home now and doing iodoform packing. He has a follow-up with his neurosurgeon Dr. Vertell Limber tomorrow. There is still moderate amount of drainage. However the patient denies fever or chills or excessive pain. He is working hard with physical therapy in order to gain ambulatory status. He has a right foot drop for which he uses an AFO brace. As far as I can tell he has not had any advanced imaging of the low back although he apparently a has had plain x-rays in the neurosurgeon's office. No recent cultures. 01/14/16; culture from 2 weeks ago was negative. He went to sees Dr. Vertell Limber last week who is still was reluctant to proceed with any more imaging studies, they follow with him on 02-14-23. They're using Aquacel Ag packing but still having copious amounts of clear yellowish drainage. This is being changed once a day. He has not been systemically unwell, no pain no fever and really no complaints other than the periwound is itchy 01/28/16 at this point in time patient has had a follow-up visit with Dr. Melven Sartorius neurosurgeon who is still not recommending any further advanced imaging studies at this point in time. We have been using Aquacel Ag packing although Dr. Vertell Limber didn't want him not to pack this as tightly. This is being changed daily. He has no signs or symptoms of systemic infection and his main issue is that he has a rash and itching around the periwound region. They have been using over-the-counter antifungal cream nothing prescribed up to this point. Fortunately he is not having a significant amount of pain at this point in time and rates his discomfort to be a 1-2 out of 10 02/04/16 no major change since I last saw this wound 3 weeks ago. Depth is 6.3 cm although his wife reports that there is much less drainage he has completed antibiotics  although I don't know that we prescribed them. He had some irritation to the periwound and was given a combination of nystatin and triamcinolone apparently he developed severe irritation from this ILYA, UNANGST. ([id number]) 02/17/16 this point in time patient seems to be doing well in regard to his back wound. He has had a follow- up with his neurosurgeon Dr. Vertell Limber who is pleased with how this is progressing. Fortunately he has no overall worsening symptoms and no interval signs or symptoms of infection. He tells me that the pain is also not as severe at this point. This is good news. his wife continues to perform the dressing changes at this point in time. 03/02/16 surgical wound on the lower lumbar spine. Depth today is 5 cm down from 5.7 last week. This appears to be making gradual progress. There is no pain. Using iodoform packing gauze Objective Constitutional Vitals Time Taken: 10:11 AM, Height: 75 in, Weight: 272 lbs, BMI: 34, Temperature: 97.8 F, Pulse: 120 bpm, Respiratory Rate: 18 breaths/min, Blood Pressure: 143/89 mmHg. Integumentary (Hair, Skin) Wound #1 status is Open. Original cause of wound was Surgical Injury. The wound is located on the Midline Back. The wound measures 0.5cm length x 0.2cm width x 4.5cm depth; 0.079cm^2 area and 0.353cm^3 volume. The wound is limited to skin breakdown. There is no tunneling or undermining noted. There is a large amount of purulent drainage noted. The wound margin is flat and intact. There is large (67-100%) red granulation within the wound bed. There is no necrotic tissue within the wound bed. The periwound skin appearance exhibited: Excoriation, Moist, Ecchymosis. The periwound skin appearance did not exhibit: Callus, Crepitus, Fluctuance, Friable, Induration, Localized Edema, Rash, Scarring, Dry/Scaly, Maceration, Atrophie Blanche, Cyanosis, Hemosiderin Staining, Mottled, Pallor, Rubor, Erythema. Periwound temperature was noted as No  Abnormality. Assessment Active Problems ICD-10 T81.31XD - Disruption of external operation (surgical) wound, not elsewhere classified, subsequent encounter L98.429 - Non-pressure chronic ulcer of back with unspecified severity M48.06 - Spinal stenosis, lumbar region Mell, Vue W. ([id number]) Plan Wound Cleansing: Wound #1 Midline Back: Clean wound with Normal Saline. May Shower, gently pat wound dry prior to applying new dressing. Primary Wound Dressing: Wound #1 Midline Back: Iodoform packing Gauze Secondary Dressing: Wound #1 Midline Back: Boardered Foam Dressing - Allevyn Life or Equal to protect surrounding skin from tape damage. Drawtex - HHRN please provide this for patient Dressing Change Frequency: Wound #1 Midline Back: Change dressing every day. Follow-up Appointments: Wound #1 Midline Back: Return Appointment in 2 weeks. Continue with iodoform packing gauze Electronic Signature(s) Signed: 03/03/2016 9:09:09 AM By: Gretta Cool RN, BSN, Kim RN, BSN Signed: 03/03/2016 4:57:31 PM By: Linton Ham MD Previous Signature: 03/02/2016 4:42:06 PM Version By: Linton Ham MD Entered By: Gretta Cool RN, BSN, Kim on 03/03/2016 09:09:09 RUCKER, TOMA ([id number]) -------------------------------------------------------------------------------- [place] Details Patient Name: Mitchell [name] Date of Service: 03/02/2016 Medical Record Patient Account Number: [id number] Number: Treating RN: Montey Hora 1934/11/27 (81 y.o. Other Clinician: Date of Birth/Sex: Male) Treating Nonnie Pickney Primary Care Physician: Tedra Senegal Physician/Extender: G Referring Physician: Tedra Senegal Weeks in Treatment: 8 Diagnosis Coding ICD-10 Codes Code Description Disruption of external operation (surgical) wound, not elsewhere classified, subsequent T81.31XD encounter L98.429 Non-pressure chronic ulcer of back with unspecified severity M48.06 Spinal stenosis, lumbar  region Facility Procedures CPT4 Code: [id number] Description: [id number] - WOUND CARE VISIT-LEV 2 EST PT Modifier: Quantity: 1 Physician Procedures CPT4: Description Modifier Quantity Code [id number] - WC PHYS LEVEL 2 - EST PT 1 ICD-10  Description Diagnosis T81.31XD Disruption of external operation (surgical) wound, not elsewhere classified, subsequent encounter Electronic Signature(s) Signed: 03/02/2016 4:42:06 PM By: Linton Ham MD Entered By: Linton Ham on 03/02/2016 12:19:08

## 2016-03-03 NOTE — Progress Notes (Signed)
Mitchell [name] ([id number]) Visit Report for 03/02/2016 Arrival Information Details Patient Name: Mitchell [name], Mitchell [name] Date of Service: 03/02/2016 10:00 AM Medical Record Number: [id number] Patient Account Number: [id number] Date of Birth/Sex: [birth year] (81 y.o. Male) Treating RN: Mitchell [name] Primary Care Physician: Mitchell [name] Other Clinician: Referring Physician: Tedra [name] Treating Physician/Extender: Mitchell [name] in Treatment: 8 Visit Information History Since Last Visit Added or deleted any medications: No Patient Arrived: Walker Any new allergies or adverse reactions: No Arrival Time: 10:07 Had a fall or experienced change in No Accompanied By: spouse activities of daily living that may affect Transfer Assistance: None risk of falls: Patient Identification Verified: Yes Signs or symptoms of abuse/neglect since last No Secondary Verification Process Completed: Yes visito Patient Requires Transmission-Based No Hospitalized since last visit: No Precautions: Pain Present Now: Yes Patient Has Alerts: No Electronic Signature(s) Signed: 03/02/2016 4:35:41 PM By: Mitchell [name] Entered By: Mitchell [name] on 03/02/2016 10:11:10 Mitchell [name] ([id number]) -------------------------------------------------------------------------------- Clinic Level of Care Assessment Details Patient Name: Mitchell [name] Date of Service: 03/02/2016 10:00 AM Medical Record Number: [id number] Patient Account Number: [id number] Date of Birth/Sex: 1934/05/22 (81 y.o. Male) Treating RN: Mitchell [name] Primary Care Physician: Mitchell [name] Other Clinician: Referring Physician: Tedra [name] Treating Physician/Extender: Mitchell [name] in Treatment: 8 Clinic Level of Care Assessment Items TOOL 4 Quantity Score []  - Use when only an EandM is performed on FOLLOW-UP visit 0 ASSESSMENTS - Nursing Assessment / Reassessment X - Reassessment of Co-morbidities (includes updates  in patient status) 1 10 X - Reassessment of Adherence to Treatment Plan 1 5 ASSESSMENTS - Wound and Skin Assessment / Reassessment X - Simple Wound Assessment / Reassessment - one wound 1 5 []  - Complex Wound Assessment / Reassessment - multiple wounds 0 []  - Dermatologic / Skin Assessment (not related to wound area) 0 ASSESSMENTS - Focused Assessment []  - Circumferential Edema Measurements - multi extremities 0 []  - Nutritional Assessment / Counseling / Intervention 0 []  - Lower Extremity Assessment (monofilament, tuning fork, pulses) 0 []  - Peripheral Arterial Disease Assessment (using hand held doppler) 0 ASSESSMENTS - Ostomy and/or Continence Assessment and Care []  - Incontinence Assessment and Management 0 []  - Ostomy Care Assessment and Management (repouching, etc.) 0 PROCESS - Coordination of Care X - Simple Patient / Family Education for ongoing care 1 15 []  - Complex (extensive) Patient / Family Education for ongoing care 0 []  - Staff obtains Programmer, systems, Records, Test Results / Process Orders 0 []  - Staff telephones HHA, Nursing Homes / Clarify orders / etc 0 []  - Routine Transfer to another Facility (non-emergent condition) 0 [name], [name] ([id number]) []  - Routine Hospital Admission (non-emergent condition) 0 []  - New Admissions / Biomedical engineer / Ordering NPWT, Apligraf, etc. 0 []  - Emergency Hospital Admission (emergent condition) 0 X - Simple Discharge Coordination 1 10 []  - Complex (extensive) Discharge Coordination 0 PROCESS - Special Needs []  - Pediatric / Minor Patient Management 0 []  - Isolation Patient Management 0 []  - Hearing / Language / Visual special needs 0 []  - Assessment of Community assistance (transportation, D/C planning, etc.) 0 []  - Additional assistance / Altered mentation 0 []  - Support Surface(s) Assessment (bed, cushion, seat, etc.) 0 INTERVENTIONS - Wound Cleansing / Measurement X - Simple Wound Cleansing - one wound 1 5 []  - Complex  Wound Cleansing - multiple wounds 0 X - Wound Imaging (photographs - any number of wounds) 1 5 []  - Wound Tracing (instead of photographs) 0  X - Simple Wound Measurement - one wound 1 5 []  - Complex Wound Measurement - multiple wounds 0 INTERVENTIONS - Wound Dressings X - Small Wound Dressing one or multiple wounds 1 10 []  - Medium Wound Dressing one or multiple wounds 0 []  - Large Wound Dressing one or multiple wounds 0 []  - Application of Medications - topical 0 []  - Application of Medications - injection 0 INTERVENTIONS - Miscellaneous []  - External ear exam 0 [name], [name] ([id number]) []  - Specimen Collection (cultures, biopsies, blood, body fluids, etc.) 0 []  - Specimen(s) / Culture(s) sent or taken to Lab for analysis 0 []  - Patient Transfer (multiple staff / Harrel Lemon Lift / Similar devices) 0 []  - Simple Staple / Suture removal (25 or less) 0 []  - Complex Staple / Suture removal (26 or more) 0 []  - Hypo / Hyperglycemic Management (close monitor of Blood Glucose) 0 []  - Ankle / Brachial Index (ABI) - do not check if billed separately 0 X - Vital Signs 1 5 Has the patient been seen at the hospital within the last three years: Yes Total Score: 75 Level Of Care: New/Established - Level 2 Electronic Signature(s) Signed: 03/02/2016 4:35:41 PM By: Mitchell [name] Entered By: Mitchell [name] on 03/02/2016 10:40:35 Mitchell [name] ([id number]) -------------------------------------------------------------------------------- Encounter Discharge Information Details Patient Name: Mitchell [name] Date of Service: 03/02/2016 10:00 AM Medical Record Number: [id number] Patient Account Number: [id number] Date of Birth/Sex: [birth year] (81 y.o. Male) Treating RN: Mitchell [name] Primary Care Physician: Mitchell [name] Other Clinician: Referring Physician: Tedra [name] Treating Physician/Extender: Mitchell [name] in Treatment: 8 Encounter Discharge Information Items Discharge Pain  Level: 0 Discharge Condition: Stable Ambulatory Status: Walker Discharge Destination: Home Transportation: Private Auto Accompanied By: spouse Schedule Follow-up Appointment: Yes Medication Reconciliation completed and provided to Patient/Care No Makylah Bossard: Provided on Clinical Summary of Care: 03/02/2016 Form Type Recipient Paper Patient HD Electronic Signature(s) Signed: 03/02/2016 10:41:44 AM By: Ruthine Dose Entered By: Ruthine Dose on 03/02/2016 10:41:43 Mitchell [name] ([id number]) -------------------------------------------------------------------------------- Multi Wound Chart Details Patient Name: Mitchell [name] Date of Service: 03/02/2016 10:00 AM Medical Record Number: [id number] Patient Account Number: [id number] Date of Birth/Sex: Jun 30, 1934 (81 y.o. Male) Treating RN: Mitchell [name] Primary Care Physician: Mitchell [name] Other Clinician: Referring Physician: Tedra [name] Treating Physician/Extender: Ricard Dillon Weeks in Treatment: 8 Vital Signs Height(in): 75 Pulse(bpm): 120 Weight(lbs): 272 Blood Pressure 143/89 (mmHg): Body Mass Index(BMI): 34 Temperature(F): 97.8 Respiratory Rate 18 (breaths/min): Photos: [N/A:N/A] Wound Location: Back - Midline N/A N/A Wounding Event: Surgical Injury N/A N/A Primary Etiology: Open Surgical Wound N/A N/A Comorbid History: Cataracts, Asthma, N/A N/A Arrhythmia, Hypertension, Osteoarthritis Date Acquired: 10/24/2015 N/A N/A Weeks of Treatment: 8 N/A N/A Wound Status: Open N/A N/A Measurements L x W x D 0.5x0.2x4.5 N/A N/A (cm) Area (cm) : 0.079 N/A N/A Volume (cm) : 0.353 N/A N/A % Reduction in Area: 77.20% N/A N/A % Reduction in Volume: 86.60% N/A N/A Classification: Full Thickness Without N/A N/A Exposed Support Structures Exudate Amount: Large N/A N/A Exudate Type: Purulent N/A N/A Exudate Color: yellow, brown, green N/A N/A Wound Margin: Flat and Intact N/A N/A Granulation Amount: Large  (67-100%) N/A N/A [name], [name] ([id number]) Granulation Quality: Red N/A N/A Necrotic Amount: None Present (0%) N/A N/A Exposed Structures: Fascia: No N/A N/A Fat: No Tendon: No Muscle: No Joint: No Bone: No Limited to Skin Breakdown Epithelialization: None N/A N/A Periwound Skin Texture: Excoriation: Yes N/A N/A Edema: No Induration: No Callus: No Crepitus: No Fluctuance:  No Friable: No Rash: No Scarring: No Periwound Skin Moist: Yes N/A N/A Moisture: Maceration: No Dry/Scaly: No Periwound Skin Color: Ecchymosis: Yes N/A N/A Atrophie Blanche: No Cyanosis: No Erythema: No Hemosiderin Staining: No Mottled: No Pallor: No Rubor: No Temperature: No Abnormality N/A N/A Tenderness on No N/A N/A Palpation: Wound Preparation: Ulcer Cleansing: N/A N/A Rinsed/Irrigated with Saline Topical Anesthetic Applied: None Treatment Notes Electronic Signature(s) Signed: 03/02/2016 4:35:41 PM By: Mitchell [name] Entered By: Mitchell [name] on 03/02/2016 10:32:46 Mitchell [name] ([id number]) -------------------------------------------------------------------------------- [place] Details Patient Name: Mitchell [name] Date of Service: 03/02/2016 10:00 AM Medical Record Number: [id number] Patient Account Number: [id number] Date of Birth/Sex: 1934/07/28 (81 y.o. Male) Treating RN: Mitchell [name] Primary Care Physician: Mitchell [name] Other Clinician: Referring Physician: Tedra [name] Treating Physician/Extender: Mitchell [name] in Treatment: 8 Active Inactive Abuse / Safety / Falls / Self Care Management Nursing Diagnoses: Impaired physical mobility Potential for falls Goals: Patient will remain injury free Date Initiated: 12/31/2015 Goal Status: Active Interventions: Assess fall risk on admission and as needed Notes: Orientation to the Wound Care Program Nursing Diagnoses: Knowledge deficit related to the wound healing center  program Goals: Patient/caregiver will verbalize understanding of the [facility] Date Initiated: 12/31/2015 Goal Status: Active Interventions: Provide education on orientation to the wound center Notes: Wound/Skin Impairment Nursing Diagnoses: Impaired tissue integrity Goals: Patient/caregiver will verbalize understanding of skin care regimen Mitchell [name], Mitchell [name] ([id number]) Date Initiated: 12/31/2015 Goal Status: Active Ulcer/skin breakdown will have a volume reduction of 30% by week 4 Date Initiated: 12/31/2015 Goal Status: Active Ulcer/skin breakdown will have a volume reduction of 50% by week 8 Date Initiated: 12/31/2015 Goal Status: Active Ulcer/skin breakdown will have a volume reduction of 80% by week 12 Date Initiated: 12/31/2015 Goal Status: Active Ulcer/skin breakdown will heal within 14 weeks Date Initiated: 12/31/2015 Goal Status: Active Interventions: Assess patient/caregiver ability to obtain necessary supplies Assess patient/caregiver ability to perform ulcer/skin care regimen upon admission and as needed Assess ulceration(s) every visit Notes: Electronic Signature(s) Signed: 03/02/2016 4:35:41 PM By: Mitchell [name] Entered By: Mitchell [name] on 03/02/2016 10:32:38 Mitchell [name] ([id number]) -------------------------------------------------------------------------------- Pain Assessment Details Patient Name: Mitchell [name] Date of Service: 03/02/2016 10:00 AM Medical Record Number: [id number] Patient Account Number: [id number] Date of Birth/Sex: 10-Nov-1934 (81 y.o. Male) Treating RN: Mitchell [name] Primary Care Physician: Mitchell [name] Other Clinician: Referring Physician: Tedra [name] Treating Physician/Extender: Ricard Dillon Weeks in Treatment: 8 Active Problems Location of Pain Severity and Description of Pain Patient Has Paino Yes Site Locations Pain Location: Generalized Pain With Dressing Change: No Duration of the  Pain. Constant / Intermittento Intermittent Pain Management and Medication Current Pain Management: Goals for Pain Management generalized back pain Notes Topical or injectable lidocaine is offered to patient for acute pain when surgical debridement is performed. If needed, Patient is instructed to use over the counter pain medication for the following 24-48 hours after debridement. Wound care MDs do not prescribed pain medications. Patient has chronic pain or uncontrolled pain. Patient has been instructed to make an appointment with their Primary Care Physician for pain management. Electronic Signature(s) Signed: 03/02/2016 4:35:41 PM By: Mitchell [name] Entered By: Mitchell [name] on 03/02/2016 10:11:33 Mitchell [name] ([id number]) -------------------------------------------------------------------------------- Patient/Caregiver Education Details Patient Name: Mitchell [name] Date of Service: 03/02/2016 10:00 AM Medical Record Number: [id number] Patient Account Number: [id number] Date of Birth/Gender: [birth year] (81 y.o. Male) Treating RN: Mitchell [name] Primary Care Physician: Mitchell [name] Other Clinician: Referring Physician: Tedra [name]  Treating Physician/Extender: Mitchell [name] in Treatment: 8 Education Assessment Education Provided To: Patient and Caregiver Education Topics Provided Wound/Skin Impairment: Handouts: Other: reportable s/s Methods: Demonstration, Explain/Verbal Responses: State content correctly Electronic Signature(s) Signed: 03/02/2016 4:35:41 PM By: Mitchell [name] Entered By: Mitchell [name] on 03/02/2016 10:41:31 [name], Mitchell Dyer ([id number]) -------------------------------------------------------------------------------- Wound Assessment Details Patient Name: Mitchell [name] Date of Service: 03/02/2016 10:00 AM Medical Record Number: [id number] Patient Account Number: [id number] Date of Birth/Sex: 1934/05/20 (81 y.o. Male) Treating RN:  Mitchell [name] Primary Care Physician: Mitchell [name] Other Clinician: Referring Physician: Tedra [name] Treating Physician/Extender: Mitchell [name] in Treatment: 8 Wound Status Wound Number: 1 Primary Open Surgical Wound Etiology: Wound Location: Back - Midline Wound Open Wounding Event: Surgical Injury Status: Date Acquired: 10/24/2015 Comorbid Cataracts, Asthma, Arrhythmia, Weeks Of Treatment: 8 History: Hypertension, Osteoarthritis Clustered Wound: No Photos Wound Measurements Length: (cm) 0.5 Width: (cm) 0.2 Depth: (cm) 4.5 Area: (cm) 0.079 Volume: (cm) 0.353 % Reduction in Area: 77.2% % Reduction in Volume: 86.6% Epithelialization: None Tunneling: No Undermining: No Wound Description Full Thickness Without Exposed Classification: Support Structures Wound Margin: Flat and Intact Exudate Large Amount: Exudate Type: Purulent Exudate Color: yellow, brown, green Foul Odor After Cleansing: No Wound Bed Granulation Amount: Large (67-100%) Exposed Structure Granulation Quality: Red Fascia Exposed: No Necrotic Amount: None Present (0%) Fat Layer Exposed: No Wickliffe, [name] ([id number]) Tendon Exposed: No Muscle Exposed: No Joint Exposed: No Bone Exposed: No Limited to Skin Breakdown Periwound Skin Texture Texture Color No Abnormalities Noted: No No Abnormalities Noted: No Callus: No Atrophie Blanche: No Crepitus: No Cyanosis: No Excoriation: Yes Ecchymosis: Yes Fluctuance: No Erythema: No Friable: No Hemosiderin Staining: No Induration: No Mottled: No Localized Edema: No Pallor: No Rash: No Rubor: No Scarring: No Temperature / Pain Moisture Temperature: No Abnormality No Abnormalities Noted: No Dry / Scaly: No Maceration: No Moist: Yes Wound Preparation Ulcer Cleansing: Rinsed/Irrigated with Saline Topical Anesthetic Applied: None Treatment Notes Wound #1 (Midline Back) 1. Cleansed with: Clean wound with Normal Saline 3.  Peri-wound Care: Skin Prep 4. Dressing Applied: Iodoform packing Gauze 5. Secondary Dressing Applied Bordered Foam Dressing Notes [employer] Electronic Signature(s) Signed: 03/02/2016 4:35:41 PM By: Mitchell [name] Entered By: Mitchell [name] on 03/02/2016 10:19:08 Mitchell [name], Mitchell [name] ([id number]) -------------------------------------------------------------------------------- Vitals Details Patient Name: Mitchell [name] Date of Service: 03/02/2016 10:00 AM Medical Record Number: [id number] Patient Account Number: [id number] Date of Birth/Sex: 09/02/1934 (81 y.o. Male) Treating RN: Mitchell [name] Primary Care Physician: Mitchell [name] Other Clinician: Referring Physician: Tedra [name] Treating Physician/Extender: Mitchell [name] in Treatment: 8 Vital Signs Time Taken: 10:11 Temperature (F): 97.8 Height (in): 75 Pulse (bpm): 120 Weight (lbs): 272 Respiratory Rate (breaths/min): 18 Body Mass Index (BMI): 34 Blood Pressure (mmHg): 143/89 Reference Range: 80 - 120 mg / dl Electronic Signature(s) Signed: 03/02/2016 4:35:41 PM By: Mitchell [name] Entered By: Mitchell [name] on 03/02/2016 10:13:14

## 2016-03-05 DIAGNOSIS — M545 Low back pain: Secondary | ICD-10-CM | POA: Diagnosis not present

## 2016-03-08 DIAGNOSIS — M545 Low back pain: Secondary | ICD-10-CM | POA: Diagnosis not present

## 2016-03-10 DIAGNOSIS — M545 Low back pain: Secondary | ICD-10-CM | POA: Diagnosis not present

## 2016-03-15 DIAGNOSIS — M545 Low back pain: Secondary | ICD-10-CM | POA: Diagnosis not present

## 2016-03-16 ENCOUNTER — Encounter: Payer: Medicare Other | Admitting: Internal Medicine

## 2016-03-16 DIAGNOSIS — J45909 Unspecified asthma, uncomplicated: Secondary | ICD-10-CM | POA: Diagnosis not present

## 2016-03-16 DIAGNOSIS — T8131XD Disruption of external operation (surgical) wound, not elsewhere classified, subsequent encounter: Secondary | ICD-10-CM | POA: Diagnosis not present

## 2016-03-16 DIAGNOSIS — M1990 Unspecified osteoarthritis, unspecified site: Secondary | ICD-10-CM | POA: Diagnosis not present

## 2016-03-16 DIAGNOSIS — T8189XA Other complications of procedures, not elsewhere classified, initial encounter: Secondary | ICD-10-CM | POA: Diagnosis not present

## 2016-03-16 DIAGNOSIS — I10 Essential (primary) hypertension: Secondary | ICD-10-CM | POA: Diagnosis not present

## 2016-03-16 DIAGNOSIS — M48061 Spinal stenosis, lumbar region without neurogenic claudication: Secondary | ICD-10-CM | POA: Diagnosis not present

## 2016-03-16 DIAGNOSIS — L98429 Non-pressure chronic ulcer of back with unspecified severity: Secondary | ICD-10-CM | POA: Diagnosis not present

## 2016-03-17 DIAGNOSIS — M545 Low back pain: Secondary | ICD-10-CM | POA: Diagnosis not present

## 2016-03-17 NOTE — Progress Notes (Addendum)
CERGIO, LANDAU ([id number]) Visit Report for 03/16/2016 Chief Complaint Document Details Patient Name: QUATAVIOUS, GROEBER Date of Service: 03/16/2016 10:45 AM Medical Record Patient Account Number: [id number] Number: Treating RN: Montey Hora [birth year] (81 y.o. Other Clinician: Date of Birth/Sex: Male) Treating Rylan Bernard Primary Care Physician: Tedra Senegal Physician/Extender: G Referring Physician: Assunta Gambles in Treatment: 10 Information Obtained from: Patient Chief Complaint 01/28/16 81 year old man admitted to clinic for review of his surgical wound in his lumbar spine area Electronic Signature(s) Signed: 03/16/2016 6:11:07 PM By: Linton Ham MD Entered By: Linton Ham on 03/16/2016 11:35:12 [name] ([id number]) -------------------------------------------------------------------------------- HPI Details Patient Name: [name] Date of Service: 03/16/2016 10:45 AM Medical Record Patient Account Number: [id number] Number: Treating RN: Montey Hora [birth year] (81 y.o. Other Clinician: Date of Birth/Sex: Male) Treating Remedy Corporan Primary Care Physician: Tedra Senegal Physician/Extender: G Referring Physician: Tedra Senegal Weeks in Treatment: 10 History of Present Illness HPI Description: 12/31/15; this is a patient to was increasingly incapacitated over the last year with severe lumbar spinal stenosis and radiculopathy at L4-L5 L5-S1 with a large disc herniation at L2-L3. He had several spinal injections over the last year with no relief in his pain. On July 7 17 he had a redo decompression with fusion at L4-L5 L5-S1 and a laminectomy and microdiscectomy of L2-L3 with pedicle screw fixation L2-S1. Is not really clear to me at the time of this dictation as to the exact course of this wound. However he was discharged to rehabilitation at [facility] and it was very clear at the end of the stay here that there  was an open area for which she had a wound VAC for a period of time. A culture on 10/29/15 showed both Pseudomonas and Klebsiella. He was discharged on ciprofloxacin and he still remains on that currently. He has home health going into his home now and doing iodoform packing. He has a follow-up with his neurosurgeon Dr. Vertell Limber tomorrow. There is still moderate amount of drainage. However the patient denies fever or chills or excessive pain. He is working hard with physical therapy in order to gain ambulatory status. He has a right foot drop for which he uses an AFO brace. As far as I can tell he has not had any advanced imaging of the low back although he apparently a has had plain x-rays in the neurosurgeon's office. No recent cultures. 01/14/16; culture from 2 weeks ago was negative. He went to sees Dr. Vertell Limber last week who is still was reluctant to proceed with any more imaging studies, they follow with him on 01-25-2023. They're using Aquacel Ag packing but still having copious amounts of clear yellowish drainage. This is being changed once a day. He has not been systemically unwell, no pain no fever and really no complaints other than the periwound is itchy 01/28/16 at this point in time patient has had a follow-up visit with Dr. Melven Sartorius neurosurgeon who is still not recommending any further advanced imaging studies at this point in time. We have been using Aquacel Ag packing although Dr. Vertell Limber didn't want him not to pack this as tightly. This is being changed daily. He has no signs or symptoms of systemic infection and his main issue is that he has a rash and itching around the periwound region. They have been using over-the-counter antifungal cream nothing prescribed up to this point. Fortunately he is not having a significant amount of pain at this point in time and  rates his discomfort to be a 1-2 out of 10 02/04/16 no major change since I last saw this wound 3 weeks ago. Depth is 6.3 cm  although his wife reports that there is much less drainage he has completed antibiotics although I don't know that we prescribed them. He had some irritation to the periwound and was given a combination of nystatin and triamcinolone apparently he developed severe irritation from this 02/17/16 this point in time patient seems to be doing well in regard to his back wound. He has had a follow- up with his neurosurgeon Dr. Vertell Limber who is pleased with how this is progressing. Fortunately he has no overall worsening symptoms and no interval signs or symptoms of infection. He tells me that the pain is also not as severe at this point. This is good news. his wife continues to perform the dressing changes at this CAITLIN, SYMES. ([id number]) point in time. 03/02/16 surgical wound on the lower lumbar spine. Depth today is 5 cm down from 5.7 last week. This appears to be making gradual progress. There is no pain. Using iodoform packing gauze 03/16/16; surgical wound on the lower lumbar spine. Depth today is 4 cm, according to our intake there is down from 4.5 last time although I have 5 cm listed my notes. Nevertheless over time this appears to be gradually filling in Electronic Signature(s) Signed: 03/16/2016 6:11:07 PM By: Linton Ham MD Entered By: Linton Ham on 03/16/2016 11:36:43 TAREK, MCLANE ([id number]) -------------------------------------------------------------------------------- Physical Exam Details Patient Name: [name] Date of Service: 03/16/2016 10:45 AM Medical Record Patient Account Number: [id number] Number: Treating RN: Montey Hora 27-Nov-1934 (81 y.o. Other Clinician: Date of Birth/Sex: Male) Treating Kinan Safley Primary Care Physician: Tedra Senegal Physician/Extender: G Referring Physician: Tedra Senegal Weeks in Treatment: 10 Constitutional Patient is hypertensive.. Pulse regular and within target range for patient.[name] Respirations  regular, non-labored and within target range.. Temperature is normal and within the target range for the patient.. Patient's appearance is neat and clean. Appears in no acute distress. Well nourished and well developed.. Notes Wound exam; at 4 cm there is slightly less depth to this small sinus tract wound. We appear to me making gradual progress. There is no tenderness around here no purulent drainage no crepitus Electronic Signature(s) Signed: 03/16/2016 6:11:07 PM By: Linton Ham MD Entered By: Linton Ham on 03/16/2016 11:37:55 [name] ([id number]) -------------------------------------------------------------------------------- Physician Orders Details Patient Name: [name] Date of Service: 03/16/2016 10:45 AM Medical Record Patient Account Number: [id number] Number: Treating RN: Montey Hora [birth year] (81 y.o. Other Clinician: Date of Birth/Sex: Male) Treating Amariya Liskey Primary Care Physician: Tedra Senegal Physician/Extender: G Referring Physician: Assunta Gambles in Treatment: 10 Verbal / Phone Orders: Yes Clinician: Montey Hora Read Back and Verified: Yes Diagnosis Coding Wound Cleansing Wound #1 Midline Back o Clean wound with Normal Saline. o May Shower, gently pat wound dry prior to applying new dressing. Primary Wound Dressing Wound #1 Midline Back o Iodoform packing Gauze Secondary Dressing Wound #1 Midline Back o Boardered Foam Dressing - Allevyn Life or Equal to protect surrounding skin from tape damage. o Drawtex - HHRN please provide this for patient Dressing Change Frequency Wound #1 Midline Back o Change dressing every day. Follow-up Appointments Wound #1 Midline Back o Return Appointment in 2 weeks. Electronic Signature(s) Signed: 03/30/2016 1:38:37 PM By: Gretta Cool RN, BSN, Kim RN, BSN Signed: 04/07/2016 8:18:03 AM By: Linton Ham MD Previous Signature: 03/16/2016 6:05:10 PM Version  By:  Montey Hora Previous Signature: 03/16/2016 6:11:07 PM Version By: Linton Ham MD Entered By: Gretta Cool RN, BSN, Kim on 03/30/2016 11:38:12 TUSHAR, DENNEE ([id number]) -------------------------------------------------------------------------------- Problem List Details Patient Name: [name] Date of Service: 03/16/2016 10:45 AM Medical Record Patient Account Number: [id number] Number: Treating RN: Montey Hora 07/07/1934 (81 y.o. 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 Other Clinician: Date of Birth/Sex: Male) Treating Jaanvi Fizer Primary Care Physician: Tedra Senegal Physician/Extender: G Referring Physician: Assunta Gambles in Treatment: 10 Subjective Chief Complaint Information obtained from Patient 01/28/16 81 year old man admitted to clinic for review of his surgical wound in his lumbar spine area History of Present Illness (HPI) 12/31/15; this is a patient to was increasingly incapacitated  over the last year with severe lumbar spinal stenosis and radiculopathy at L4-L5 L5-S1 with a large disc herniation at L2-L3. He had several spinal injections over the last year with no relief in his pain. On July 7 17 he had a redo decompression with fusion at L4-L5 L5-S1 and a laminectomy and microdiscectomy of L2-L3 with pedicle screw fixation L2-S1. Is not really clear to me at the time of this dictation as to the exact course of this wound. However he was discharged to rehabilitation at [facility] and it was very clear at the end of the stay here that there was an open area for which she had a wound VAC for a period of time. A culture on 10/29/15 showed both Pseudomonas and Klebsiella. He was discharged on ciprofloxacin and he still remains on that currently. He has home health going into his home now and doing iodoform packing. He has a follow-up with his neurosurgeon Dr. Vertell Limber tomorrow. There is still moderate amount of drainage. However the patient denies fever or chills or excessive pain. He is working hard with physical therapy in order to gain ambulatory status. He has a right foot drop for which he uses an AFO brace. As far as I can tell he has not had any advanced imaging of the low back although he apparently a has had plain x-rays in the neurosurgeon's office. No recent cultures. 01/14/16; culture from 2 weeks ago was negative. He went to sees Dr. Vertell Limber last week who is still was reluctant to proceed with any more imaging studies, they follow with him on 02/14/2023. They're using Aquacel Ag packing but still having copious amounts of clear yellowish drainage. This is being changed once a day. He has not been systemically unwell, no pain no fever and really no complaints other than the periwound is itchy 01/28/16 at this point in time patient has had a follow-up visit with Dr. Melven Sartorius neurosurgeon who is still not recommending any further advanced imaging studies at this point in  time. We have been using Aquacel Ag packing although Dr. Vertell Limber didn't want him not  to pack this as tightly. This is being changed daily. He has no signs or symptoms of systemic infection and his main issue is that he has a rash and itching around the periwound region. They have been using over-the-counter antifungal cream nothing prescribed up to this point. Fortunately he is not having a significant amount of pain at this point in time and rates his discomfort to be a 1-2 out of 10 02/04/16 no major change since I last saw this wound 3 weeks ago. Depth is 6.3 cm although his wife reports that there is much less drainage he has completed antibiotics although I don't know that we prescribed them. He had some irritation to the periwound and was given a combination of nystatin and Hasegawa, Jariah W. ([id number]) triamcinolone apparently he developed severe irritation from this 02/17/16 this point in time patient seems to be doing well in regard to his back wound. He has had a follow- up with his neurosurgeon Dr. Vertell Limber who is pleased with how this is progressing. Fortunately he has no overall worsening symptoms and no interval signs or symptoms of infection. He tells me that the pain is also not as severe at this point. This is good news. his wife continues to perform the dressing changes at this point in time. 03/02/16 surgical wound on the lower lumbar spine. Depth today is 5 cm down from 5.7 last week. This appears to be making gradual progress. There is no pain. Using iodoform packing gauze 03/16/16; surgical wound on the lower lumbar spine. Depth today is 4 cm, according to our intake there is down from 4.5 last time although I have 5 cm listed my notes. Nevertheless over time this appears to be gradually filling in Objective Constitutional Patient is hypertensive.. Pulse regular and within target range for patient.[name] Respirations regular, non-labored and within target range.. Temperature is  normal and within the target range for the patient.. Patient's appearance is neat and clean. Appears in no acute distress. Well nourished and well developed.. Vitals Time Taken: 11:07 AM, Height: 75 in, Weight: 272 lbs, BMI: 34, Temperature: 98.0 F, Pulse: 123 bpm, Respiratory Rate: 18 breaths/min, Blood Pressure: 152/98 mmHg. General Notes: Wound exam; at 4 cm there is slightly less depth to this small sinus tract wound. We appear to me making gradual progress. There is no tenderness around here no purulent drainage no crepitus Integumentary (Hair, Skin) Wound #1 status is Open. Original cause of wound was Surgical Injury. The wound is located on the Midline Back. The wound measures 0.4cm length x 0.2cm width x 4.1cm depth; 0.063cm^2 area and 0.258cm^3 volume. The wound is limited to skin breakdown. There is no tunneling or undermining noted. There is a large amount of purulent drainage noted. The wound margin is flat and intact. There is large (67-100%) red granulation within the wound bed. There is no necrotic tissue within the wound bed. The periwound skin appearance exhibited: Excoriation, Moist, Ecchymosis. The periwound skin appearance did not exhibit: Callus, Crepitus, Fluctuance, Friable, Induration, Localized Edema, Rash, Scarring, Dry/Scaly, Maceration, Atrophie Blanche, Cyanosis, Hemosiderin Staining, Mottled, Pallor, Rubor, Erythema. Periwound temperature was noted as No Abnormality. Wound #1 status is Open. Original cause of wound was Surgical Injury. The wound is located on the Midline Back. The wound measures 0.4cm length x 0.2cm width x 4.1cm depth; 0.063cm^2 area and 0.258cm^3 volume. SAMEH, PERI ([id number]) Assessment Active Problems ICD-10 T81.31XD - Disruption of external operation (surgical) wound, not elsewhere classified, subsequent encounter L98.429 - Non-pressure chronic ulcer  of back with unspecified severity M48.06 - Spinal stenosis, lumbar  region Plan Wound Cleansing: Wound #1 Midline Back: Clean wound with Normal Saline. May Shower, gently pat wound dry prior to applying new dressing. Primary Wound Dressing: Wound #1 Midline Back: Iodoform packing Gauze Secondary Dressing: Wound #1 Midline Back: Boardered Foam Dressing - Allevyn Life or Equal to protect surrounding skin from tape damage. Drawtex - HHRN please provide this for patient Dressing Change Frequency: Wound #1 Midline Back: Change dressing every day. Follow-up Appointments: Wound #1 Midline Back: Return Appointment in 2 weeks. #1 there is no point in changing his current dressing which is iodoform packing. No evidence of infection no need for cultures or antibiotics BABU, GOHN ([id number]) Electronic Signature(s) Signed: 03/16/2016 6:11:07 PM By: Linton Ham MD Entered By: Linton Ham on 03/16/2016 11:39:11 QUANTAVIS, RIVETT ([id number]) -------------------------------------------------------------------------------- SuperBill Details Patient Name: [name] Date of Service: 03/16/2016 Medical Record Patient Account Number: [id number] Number: Treating RN: Montey Hora May 07, 1934 (81 y.o. 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

## 2016-03-17 NOTE — Progress Notes (Signed)
JABBAR, KULIGOWSKI ([id number]) Visit Report for 03/16/2016 Arrival Information Details Patient Name: Mitchell [name], Mitchell [name] Date of Service: 03/16/2016 10:45 AM Medical Record Number: [id number] Patient Account Number: [id number] Date of Birth/Sex: 1934-09-21 (81 y.o. Male) Treating RN: Montey Hora Primary Care Physician: Tedra Senegal Other Clinician: Referring Physician: Tedra Senegal Treating Physician/Extender: Tito Dine in Treatment: 10 Visit Information History Since Last Visit Added or deleted any medications: No Patient Arrived: Walker Any new allergies or adverse reactions: No Arrival Time: 11:04 Had a fall or experienced change in No Accompanied By: spouse activities of daily living that may affect Transfer Assistance: None risk of falls: Patient Identification Verified: Yes Signs or symptoms of abuse/neglect since last No Secondary Verification Process Completed: Yes visito Patient Requires Transmission-Based No Hospitalized since last visit: No Precautions: Pain Present Now: Yes Patient Has Alerts: No Electronic Signature(s) Signed: 03/16/2016 6:05:10 PM By: Montey Hora Entered By: Montey Hora on 03/16/2016 11:07:28 Mitchell [name] ([id number]) -------------------------------------------------------------------------------- Clinic Level of Care Assessment Details Patient Name: Mitchell [name] Date of Service: 03/16/2016 10:45 AM Medical Record Number: [id number] Patient Account Number: [id number] Date of Birth/Sex: [birth year] (81 y.o. Male) Treating RN: Montey Hora Primary Care Physician: Tedra Senegal Other Clinician: Referring Physician: Tedra Senegal Treating Physician/Extender: Tito Dine in Treatment: 10 Clinic Level of Care Assessment Items TOOL 4 Quantity Score []  - Use when only an EandM is performed on FOLLOW-UP visit 0 ASSESSMENTS - Nursing Assessment / Reassessment X - Reassessment of Co-morbidities (includes  updates in patient status) 1 10 X - Reassessment of Adherence to Treatment Plan 1 5 ASSESSMENTS - Wound and Skin Assessment / Reassessment X - Simple Wound Assessment / Reassessment - one wound 1 5 []  - Complex Wound Assessment / Reassessment - multiple wounds 0 []  - Dermatologic / Skin Assessment (not related to wound area) 0 ASSESSMENTS - Focused Assessment []  - Circumferential Edema Measurements - multi extremities 0 []  - Nutritional Assessment / Counseling / Intervention 0 []  - Lower Extremity Assessment (monofilament, tuning fork, pulses) 0 []  - Peripheral Arterial Disease Assessment (using hand held doppler) 0 ASSESSMENTS - Ostomy and/or Continence Assessment and Care []  - Incontinence Assessment and Management 0 []  - Ostomy Care Assessment and Management (repouching, etc.) 0 PROCESS - Coordination of Care X - Simple Patient / Family Education for ongoing care 1 15 []  - Complex (extensive) Patient / Family Education for ongoing care 0 []  - Staff obtains Programmer, systems, Records, Test Results / Process Orders 0 []  - Staff telephones HHA, Nursing Homes / Clarify orders / etc 0 []  - Routine Transfer to another Facility (non-emergent condition) 0 Philipson, Jahden W. ([id number]) []  - Routine Hospital Admission (non-emergent condition) 0 []  - New Admissions / Biomedical engineer / Ordering NPWT, Apligraf, etc. 0 []  - Emergency Hospital Admission (emergent condition) 0 X - Simple Discharge Coordination 1 10 []  - Complex (extensive) Discharge Coordination 0 PROCESS - Special Needs []  - Pediatric / Minor Patient Management 0 []  - Isolation Patient Management 0 []  - Hearing / Language / Visual special needs 0 []  - Assessment of Community assistance (transportation, D/C planning, etc.) 0 []  - Additional assistance / Altered mentation 0 []  - Support Surface(s) Assessment (bed, cushion, seat, etc.) 0 INTERVENTIONS - Wound Cleansing / Measurement X - Simple Wound Cleansing - one wound 1 5 []  -  Complex Wound Cleansing - multiple wounds 0 X - Wound Imaging (photographs - any number of wounds) 1 5 []  - Wound Tracing (instead of photographs) 0  X - Simple Wound Measurement - one wound 1 5 []  - Complex Wound Measurement - multiple wounds 0 INTERVENTIONS - Wound Dressings X - Small Wound Dressing one or multiple wounds 1 10 []  - Medium Wound Dressing one or multiple wounds 0 []  - Large Wound Dressing one or multiple wounds 0 []  - Application of Medications - topical 0 []  - Application of Medications - injection 0 INTERVENTIONS - Miscellaneous []  - External ear exam 0 Friede, Kemp W. ([id number]) []  - Specimen Collection (cultures, biopsies, blood, body fluids, etc.) 0 []  - Specimen(s) / Culture(s) sent or taken to Lab for analysis 0 []  - Patient Transfer (multiple staff / Harrel Lemon Lift / Similar devices) 0 []  - Simple Staple / Suture removal (25 or less) 0 []  - Complex Staple / Suture removal (26 or more) 0 []  - Hypo / Hyperglycemic Management (close monitor of Blood Glucose) 0 []  - Ankle / Brachial Index (ABI) - do not check if billed separately 0 X - Vital Signs 1 5 Has the patient been seen at the hospital within the last three years: Yes Total Score: 75 Level Of Care: New/Established - Level 2 Electronic Signature(s) Signed: 03/16/2016 6:05:10 PM By: Montey Hora Entered By: Montey Hora on 03/16/2016 11:32:26 Mitchell [name] ([id number]) -------------------------------------------------------------------------------- Encounter Discharge Information Details Patient Name: Mitchell [name] Date of Service: 03/16/2016 10:45 AM Medical Record Number: [id number] Patient Account Number: [id number] Date of Birth/Sex: 01-06-1935 (81 y.o. Male) Treating RN: Montey Hora Primary Care Physician: Tedra Senegal Other Clinician: Referring Physician: Tedra Senegal Treating Physician/Extender: Tito Dine in Treatment: 10 Encounter Discharge Information Items Discharge  Pain Level: 0 Discharge Condition: Stable Ambulatory Status: Walker Discharge Destination: Home Transportation: Private Auto Accompanied By: spouse Schedule Follow-up Appointment: Yes Medication Reconciliation completed and provided to Patient/Care No Florenda Watt: Provided on Clinical Summary of Care: 03/16/2016 Form Type Recipient Paper Patient HD Electronic Signature(s) Signed: 03/16/2016 11:50:56 AM By: Montey Hora Previous Signature: 03/16/2016 11:43:23 AM Version By: Ruthine Dose Entered By: Montey Hora on 03/16/2016 11:50:56 Mitchell [name] ([id number]) -------------------------------------------------------------------------------- Multi Wound Chart Details Patient Name: Mitchell [name] Date of Service: 03/16/2016 10:45 AM Medical Record Number: [id number] Patient Account Number: [id number] Date of Birth/Sex: 1934/08/18 (81 y.o. Male) Treating RN: Montey Hora Primary Care Physician: Tedra Senegal Other Clinician: Referring Physician: Tedra Senegal Treating Physician/Extender: Ricard Dillon Weeks in Treatment: 10 Vital Signs Height(in): 75 Pulse(bpm): 123 Weight(lbs): 272 Blood Pressure 152/98 (mmHg): Body Mass Index(BMI): 34 Temperature(F): 98.0 Respiratory Rate 18 (breaths/min): Photos: [1:No Photos] [N/A:N/A] Wound Location: [1:Back - Midline] [N/A:N/A] Wounding Event: [1:Surgical Injury] [N/A:N/A] Primary Etiology: [1:Open Surgical Wound] [N/A:N/A] Comorbid History: [1:Cataracts, Asthma, Arrhythmia, Hypertension, Osteoarthritis] [N/A:N/A] Date Acquired: [1:10/24/2015] [N/A:N/A] Weeks of Treatment: [1:10] [N/A:N/A] Wound Status: [1:Open] [N/A:N/A] Measurements L x W x D 0.4x0.2x4.1 [N/A:N/A] (cm) Area (cm) : [1:0.063] [N/A:N/A] Volume (cm) : [1:0.258] [N/A:N/A] % Reduction in Area: [1:81.80%] [N/A:N/A] % Reduction in Volume: 90.20% [N/A:N/A] Classification: [1:Full Thickness Without Exposed Support Structures] [N/A:N/A] Exudate Amount:  [1:Large] [N/A:N/A] Exudate Type: [1:Purulent] [N/A:N/A] Exudate Color: [1:yellow, brown, green] [N/A:N/A] Wound Margin: [1:Flat and Intact] [N/A:N/A] Granulation Amount: [1:Large (67-100%)] [N/A:N/A] Granulation Quality: [1:Red] [N/A:N/A] Necrotic Amount: [1:None Present (0%)] [N/A:N/A] Exposed Structures: [1:Fascia: No Fat: No Tendon: No Muscle: No] [N/A:N/A] Joint: No Bone: No Limited to Skin Breakdown Epithelialization: None N/A N/A Periwound Skin Texture: Excoriation: Yes N/A N/A Edema: No Induration: No Callus: No Crepitus: No Fluctuance: No Friable: No Rash: No Scarring: No Periwound Skin Moist: Yes N/A N/A Moisture:  Maceration: No Dry/Scaly: No Periwound Skin Color: Ecchymosis: Yes N/A N/A Atrophie Blanche: No Cyanosis: No Erythema: No Hemosiderin Staining: No Mottled: No Pallor: No Rubor: No Temperature: No Abnormality N/A N/A Tenderness on No N/A N/A Palpation: Wound Preparation: Ulcer Cleansing: N/A N/A Rinsed/Irrigated with Saline Topical Anesthetic Applied: None Treatment Notes Electronic Signature(s) Signed: 03/16/2016 11:29:56 AM By: Montey Hora Entered By: Montey Hora on 03/16/2016 11:29:56 Mitchell [name] ([id number]) -------------------------------------------------------------------------------- [place] Details Patient Name: Mitchell [name] Date of Service: 03/16/2016 10:45 AM Medical Record Number: [id number] Patient Account Number: [id number] Date of Birth/Sex: June 12, 1934 (81 y.o. Male) Treating RN: Montey Hora Primary Care Physician: Tedra Senegal Other Clinician: Referring Physician: Tedra Senegal Treating Physician/Extender: Tito Dine in Treatment: 10 Active Inactive Abuse / Safety / Falls / [place] Management Nursing Diagnoses: Impaired physical mobility Potential for falls Goals: Patient will remain injury free Date Initiated: 12/31/2015 Goal Status:  Active Interventions: Assess fall risk on admission and as needed Notes: Orientation to the Wound Care Program Nursing Diagnoses: Knowledge deficit related to the wound healing center program Goals: Patient/caregiver will verbalize understanding of the [place] Program Date Initiated: 12/31/2015 Goal Status: Active Interventions: Provide education on orientation to the wound center Notes: Wound/Skin Impairment Nursing Diagnoses: Impaired tissue integrity Goals: Patient/caregiver will verbalize understanding of skin care regimen QUI, SIT ([id number]) Date Initiated: 12/31/2015 Goal Status: Active Ulcer/skin breakdown will have a volume reduction of 30% by week 4 Date Initiated: 12/31/2015 Goal Status: Active Ulcer/skin breakdown will have a volume reduction of 50% by week 8 Date Initiated: 12/31/2015 Goal Status: Active Ulcer/skin breakdown will have a volume reduction of 80% by week 12 Date Initiated: 12/31/2015 Goal Status: Active Ulcer/skin breakdown will heal within 14 weeks Date Initiated: 12/31/2015 Goal Status: Active Interventions: Assess patient/caregiver ability to obtain necessary supplies Assess patient/caregiver ability to perform ulcer/skin care regimen upon admission and as needed Assess ulceration(s) every visit Notes: Electronic Signature(s) Signed: 03/16/2016 11:29:07 AM By: Montey Hora Entered By: Montey Hora on 03/16/2016 11:29:07 Mitchell [name] ([id number]) -------------------------------------------------------------------------------- Pain Assessment Details Patient Name: Mitchell [name] Date of Service: 03/16/2016 10:45 AM Medical Record Number: [id number] Patient Account Number: [id number] Date of Birth/Sex: 1934/10/17 (81 y.o. Male) Treating RN: Montey Hora Primary Care Physician: Tedra Senegal Other Clinician: Referring Physician: Tedra Senegal Treating Physician/Extender: Ricard Dillon Weeks in Treatment:  10 Active Problems Location of Pain Severity and Description of Pain Patient Has Paino Yes Site Locations Pain Location: Generalized Pain With Dressing Change: No Pain Management and Medication Current Pain Management: Notes Topical or injectable lidocaine is offered to patient for acute pain when surgical debridement is performed. If needed, Patient is instructed to use over the counter pain medication for the following 24-48 hours after debridement. Wound care MDs do not prescribed pain medications. Patient has chronic pain or uncontrolled pain. Patient has been instructed to make an appointment with their Primary Care Physician for pain management. Electronic Signature(s) Signed: 03/16/2016 6:05:10 PM By: Montey Hora Entered By: Montey Hora on 03/16/2016 11:07:40 Mitchell [name] ([id number]) -------------------------------------------------------------------------------- Patient/Caregiver Education Details Patient Name: Mitchell [name] Date of Service: 03/16/2016 10:45 AM Medical Record Number: [id number] Patient Account Number: [id number] Date of Birth/Gender: Feb 23, 1935 (81 y.o. Male) Treating RN: Montey Hora Primary Care Physician: Tedra Senegal Other Clinician: Referring Physician: Tedra Senegal Treating Physician/Extender: Tito Dine in Treatment: 10 Education Assessment Education Provided To: Caregiver Education Topics Provided Wound/Skin Impairment: Handouts: Other: wound care to continue as ordered Methods:  Demonstration, Explain/Verbal Responses: State content correctly Electronic Signature(s) Signed: 03/16/2016 6:05:10 PM By: Montey Hora Entered By: Montey Hora on 03/16/2016 11:51:18 Mitchell [name] ([id number]) -------------------------------------------------------------------------------- Wound Assessment Details Patient Name: Mitchell [name] Date of Service: 03/16/2016 10:45 AM Medical Record Number: [id number] Patient  Account Number: [id number] Date of Birth/Sex: 1934-04-30 (81 y.o. Male) Treating RN: Montey Hora Primary Care Physician: Tedra Senegal Other Clinician: Referring Physician: Tedra Senegal Treating Physician/Extender: Tito Dine in Treatment: 10 Wound Status Wound Number: 1 Primary Open Surgical Wound Etiology: Wound Location: Back - Midline Wound Open Wounding Event: Surgical Injury Status: Date Acquired: 10/24/2015 Comorbid Cataracts, Asthma, Arrhythmia, Weeks Of Treatment: 10 History: Hypertension, Osteoarthritis Clustered Wound: No Wound Measurements Length: (cm) 0.4 Width: (cm) 0.2 Depth: (cm) 4.1 Area: (cm) 0.063 Volume: (cm) 0.258 % Reduction in Area: 81.8% % Reduction in Volume: 90.2% Epithelialization: None Tunneling: No Undermining: No Wound Description Full Thickness Without Exposed Classification: Support Structures Wound Margin: Flat and Intact Exudate Large Amount: Exudate Type: Purulent Exudate Color: yellow, brown, green Foul Odor After Cleansing: No Wound Bed Granulation Amount: Large (67-100%) Exposed Structure Granulation Quality: Red Fascia Exposed: No Necrotic Amount: None Present (0%) Fat Layer Exposed: No Tendon Exposed: No Muscle Exposed: No Joint Exposed: No Bone Exposed: No Limited to Skin Breakdown Periwound Skin Texture Texture Color No Abnormalities Noted: No No Abnormalities Noted: No Callus: No Atrophie Blanche: No Crepitus: No Cyanosis: No Brawley, Prentiss W. ([id number]) Excoriation: Yes Ecchymosis: Yes Fluctuance: No Erythema: No Friable: No Hemosiderin Staining: No Induration: No Mottled: No Localized Edema: No Pallor: No Rash: No Rubor: No Scarring: No Temperature / Pain Moisture Temperature: No Abnormality No Abnormalities Noted: No Dry / Scaly: No Maceration: No Moist: Yes Wound Preparation Ulcer Cleansing: Rinsed/Irrigated with Saline Topical Anesthetic Applied: None Electronic  Signature(s) Signed: 03/16/2016 6:05:10 PM By: Montey Hora Entered By: Montey Hora on 03/16/2016 11:29:33 Mitchell [name] ([id number]) -------------------------------------------------------------------------------- Wound Assessment Details Patient Name: Mitchell [name] Date of Service: 03/16/2016 10:45 AM Medical Record Number: [id number] Patient Account Number: [id number] Date of Birth/Sex: Jan 21, 1935 (81 y.o. Male) Treating RN: Montey Hora Primary Care Physician: Tedra Senegal Other Clinician: Referring Physician: Tedra Senegal Treating Physician/Extender: Ricard Dillon Weeks in Treatment: 10 Wound Status Wound Number: 1 Primary Open Surgical Wound Etiology: Wound Location: Back - Midline Wound Open Wounding Event: Surgical Injury Status: Date Acquired: 10/24/2015 Comorbid Cataracts, Asthma, Arrhythmia, Weeks Of Treatment: 10 History: Hypertension, Osteoarthritis Clustered Wound: No Photos Wound Measurements Length: (cm) 0.4 Width: (cm) 0.2 Depth: (cm) 4.1 Area: (cm) 0.063 Volume: (cm) 0.258 % Reduction in Area: 81.8% % Reduction in Volume: 90.2% Wound Description Full Thickness Without Exposed Classification: Support Structures Periwound Skin Texture Texture Color No Abnormalities Noted: No No Abnormalities Noted: No Moisture No Abnormalities Noted: No Treatment Notes Wound #1 (Midline Back) 1. Cleansed withMAKIA, GONSALEZ. ([id number]) Clean wound with Normal Saline 4. Dressing Applied: Iodoform packing Gauze 5. Secondary Dressing Applied Bordered Foam Dressing Dry Gauze Electronic Signature(s) Signed: 03/16/2016 6:05:10 PM By: Montey Hora Entered By: Montey Hora on 03/16/2016 11:31:22 JAIDIN, SHKOLNIK ([id number]) -------------------------------------------------------------------------------- Vitals Details Patient Name: Mitchell [name] Date of Service: 03/16/2016 10:45 AM Medical Record Number: [id number] Patient Account  Number: [id number] Date of Birth/Sex: [birth year] (81 y.o. Male) Treating RN: Montey Hora Primary Care Physician: Tedra Senegal Other Clinician: Referring Physician: Tedra Senegal Treating Physician/Extender: Tito Dine in Treatment: 10 Vital Signs Time Taken: 11:07 Temperature (F): 98.0 Height (in): 75 Pulse (bpm): 123 Weight (lbs): 272 Respiratory  Rate (breaths/min): 18 Body Mass Index (BMI): 34 Blood Pressure (mmHg): 152/98 Reference Range: 80 - 120 mg / dl Electronic Signature(s) Signed: 03/16/2016 6:05:10 PM By: Montey Hora Entered By: Montey Hora on 03/16/2016 11:09:42

## 2016-03-19 DIAGNOSIS — M545 Low back pain: Secondary | ICD-10-CM | POA: Diagnosis not present

## 2016-03-22 DIAGNOSIS — M545 Low back pain: Secondary | ICD-10-CM | POA: Diagnosis not present

## 2016-03-24 DIAGNOSIS — M545 Low back pain: Secondary | ICD-10-CM | POA: Diagnosis not present

## 2016-03-25 ENCOUNTER — Ambulatory Visit (INDEPENDENT_AMBULATORY_CARE_PROVIDER_SITE_OTHER): Payer: Medicare Other | Admitting: Internal Medicine

## 2016-03-25 ENCOUNTER — Telehealth: Payer: Self-pay | Admitting: Internal Medicine

## 2016-03-25 ENCOUNTER — Encounter: Payer: Self-pay | Admitting: Internal Medicine

## 2016-03-25 VITALS — BP 130/84 | HR 118 | Ht 75.0 in | Wt 275.0 lb

## 2016-03-25 DIAGNOSIS — I2510 Atherosclerotic heart disease of native coronary artery without angina pectoris: Secondary | ICD-10-CM

## 2016-03-25 DIAGNOSIS — J45991 Cough variant asthma: Secondary | ICD-10-CM | POA: Diagnosis not present

## 2016-03-25 DIAGNOSIS — I10 Essential (primary) hypertension: Secondary | ICD-10-CM | POA: Diagnosis not present

## 2016-03-25 DIAGNOSIS — I471 Supraventricular tachycardia: Secondary | ICD-10-CM | POA: Diagnosis not present

## 2016-03-25 MED ORDER — BISOPROLOL FUMARATE 5 MG PO TABS: 5.0000 mg | ORAL_TABLET | Freq: Every day | ORAL | 11 refills | Status: DC

## 2016-03-25 NOTE — Telephone Encounter (Signed)
Make change and see me as Dr. Melvyn Novas suggested

## 2016-03-25 NOTE — Assessment & Plan Note (Signed)
Given tendency to RAF needs higher dose BB > Strongly prefer in this setting: Bystolic, the most beta -1  selective Beta blocker available in sample form, with bisoprolol the most selective generic choice  on the market.   Try bisoprolol 5 mg daily > Follow up per Primary Care planned

## 2016-03-25 NOTE — Patient Instructions (Addendum)
Stop atenolol and start bisoprol 5 mg daily   No change on symbicort 80 Take 2 puffs first thing in am and then another 2 puffs about 12 hours later.      Work on inhaler technique:  relax and gently blow all the way out then take a nice smooth deep breath back in, triggering the inhaler at same time you start breathing in.  Hold for up to 5 seconds if you can. Blow out thru nose. Rinse and gargle with water when done      If you are satisfied with your treatment plan,  let your doctor know and he/she can either refill your medications or you can return here when your prescription runs out.     If in any way you are not 100% satisfied,  please tell us.  If 100% better, tell your friends!  Pulmonary follow up is as needed

## 2016-03-25 NOTE — Progress Notes (Signed)
Subjective:    Patient ID: Mitchell [name], male    DOB: April 12, 1935,    MRN: [id number]     Brief patient profile:  57 yowm never smoker asthma entire life typically active in fall with no need for meds in between baseline walking with cane and limited by orthopedic issues then fell May 2017 and much more limited then sept 2017 new cough/wheeze seen on 01/05/16 by Dr Renold Genta and started on dulera/symbicort 160 but no better so referred to pulmonary clinic 02/27/2016 by Dr   [name]  p transiently better on prednisone / zpak   History of Present Illness  02/27/2016 1st [facility] office visit/ Mitchell [name]   Chief Complaint  Patient presents with  . Pulmonary Consult    Referred by Dr. Renold Genta. Pt states he has had PNA all of his life. He c/o cough and wheezing for the past month. His cough is prod with very minimal clear sputum.  His wheezing is worse first thing in the am and with exertion. He has minimal SOB.   Onset was abrupt in sept 2017 and persistent day > noct  Voice is always hoarse/ protonix with breakfast  Chronically   Cough worse p stirs but does not wake him up noct or in am rec Pantoprazole (protonix) 40 mg  Take  30-60 min before first meal of the day and Pepcid (famotidine)  20 mg one @  bedtime until return to office - this is the best way to tell whether stomach acid is contributing to your problem.   Work on inhaler technique:   Plan A = Automatic =  Change to Symbicort 80 Take 2 puffs first thing in am and then another 2 puffs about 12 hours later.  Plan B = Backup Only use your albuterol(xopenex)  as a rescue medication GERD  Diet           03/25/2016  f/u ov/Ipek Westra re:  Cough variant asthma/ on symb 80 2 bid  Chief Complaint  Patient presents with  . Follow-up    Cough is less frequent and is prod with very min clear sputum. He is wheezing less. He uses xopenex inhaler 1 x per wk on average.      much better, not aware when he's in  Afib on tenormin 12.5 mg  daily   No obvious day to day or daytime variability or assoc excess/ purulent sputum or mucus plugs or hemoptysis or cp or chest tightness, subjective wheeze or overt sinus or hb symptoms. No unusual exp hx or h/o childhood pna/ asthma or knowledge of premature birth.  Sleeping ok without nocturnal  or early am exacerbation  of respiratory  c/o's or need for noct saba. Also denies any obvious fluctuation of symptoms with weather or environmental changes or other aggravating or alleviating factors except as outlined above   Current Medications, Allergies, Complete Past Medical History, Past Surgical History, Family History, and Social History were reviewed in [employer] record.  ROS  The following are not active complaints unless bolded sore throat, dysphagia, dental problems, itching, sneezing,  nasal congestion or excess/ purulent secretions, ear ache,   fever, chills, sweats, unintended wt loss, classically pleuritic or exertional cp,  orthopnea pnd or leg swelling, presyncope, palpitations, abdominal pain, anorexia, nausea, vomiting, diarrhea  or change in bowel or bladder habits, change in stools or urine, dysuria,hematuria,  rash, arthralgias, visual complaints, headache, numbness, weakness or ataxia or problems with walking or coordination,  change  in mood/affect or memory.                 Objective:   Physical Exam  W/c based wm nad   03/25/2016      Declined   02/27/16 275 lb (124.7 kg)  02/24/16 275 lb (124.7 kg)  01/05/16 272 lb (123.4 kg)    Vital signs reviewed - Note on arrival 02 sats  92% on RA  With pulse 118 IRIR   HEENT: nl dentition, turbinates, and oropharynx. Nl external ear canals without cough reflex   NECK :  without JVD/Nodes/TM/ nl carotid upstrokes bilaterally   LUNGS: no acc muscle use,  Nl contour chest which is clear to A and P bilaterally without cough on insp or exp maneuvers   CV:  IRIR apical pulse around 120  no s3 or  murmur or increase in P2, no edema   ABD:  soft and nontender with nl inspiratory excursion in the supine position. No bruits or organomegaly, bowel sounds nl  MS:  Nl gait/ ext warm without deformities, calf tenderness, cyanosis or clubbing No obvious joint restrictions   SKIN: warm and dry without lesions    NEURO:  alert, approp, nl sensorium with  no motor deficits     I personally reviewed images and agree with radiology impression as follows:  CXR:   02/24/16 Relatively similar appearance of the lungs compared to the prior plain film with coarsened interstitial markings most prominent at the lung bases, without evidence of lobar pneumonia     Lab Results  Component Value Date   TSH 3.00 02/27/2016         Assessment & Plan:

## 2016-03-25 NOTE — Assessment & Plan Note (Signed)
-  FENO 02/27/2016  =   37  On symbiocort 160 but poor hfa technique  - Spirometry 02/27/2016  FEV1 1.82 (51%)  Ratio 74 with minimal curvature   02/27/2016  After extensive coaching HFA effectiveness =    50% > change to symb 80 bid and max gerd rx - Allergy profile   02/27/16  >  Eos 1% /  IgE 137 RAST pos dust   - 03/25/2016  After extensive coaching HFA effectiveness =    75% baseline 50%  Despite lower dose symbicort and suboptimal hfa > All goals of chronic asthma control met including optimal function and elimination of symptoms with minimal need for rescue therapy.  Contingencies discussed in full including contacting this office immediately if not controlling the symptoms using the rule of two's.     I had an extended discussion with the patient and wife reviewing all relevant studies completed to date and  lasting 15 to 20 minutes of a 25 minute visit    Each maintenance medication was reviewed in detail including most importantly the difference between maintenance and prns and under what circumstances the prns are to be triggered using an action plan format that is not reflected in the computer generated alphabetically organized AVS.    Please see AVS for unique instructions that I personally wrote and verbalized to the the pt in detail and then reviewed with pt  by my nurse highlighting any  changes in therapy recommended at today's visit to their plan of care.

## 2016-03-25 NOTE — Assessment & Plan Note (Addendum)
Pt states this is freq and not able to perceive when it happens so rec trial of increase BB and f/u cards prn

## 2016-03-25 NOTE — Telephone Encounter (Signed)
Patient saw Dr. Melvyn Novas today.  He advised patient to stop Atenolol and start Bisoprolol 5mg  daily.  Wife advises that she is afraid to make any changes unless you say it is ok to do so.  She wants to know if they should make this change or not??  His heart rate was 118 while at [facility] office and he was concerned about this.  Patient walked with walker in to the office and road wheelchair up the elevator to the office.    Dr. Melvyn Novas wanted the patient to change the medication and see you in one week.  He discharged the patient after today's visit and advised that you would follow the regimen.  And now the wife is not wanting to start the medication until you say it is ok.  However, she has made an appointment for patient to be seen in one week for follow up (Thursday, 12/14 @ 11:45 to follow up on HR of 118).    Is he ok to start on Bisoprolol 5mg  and stop Atenolol?    Thank you.

## 2016-03-25 NOTE — Telephone Encounter (Signed)
Informed patients wife to follow regiment that Dr Melvyn Novas recommended and follow up with Dr Renold Genta thereafter.

## 2016-03-26 DIAGNOSIS — M545 Low back pain: Secondary | ICD-10-CM | POA: Diagnosis not present

## 2016-03-29 DIAGNOSIS — M545 Low back pain: Secondary | ICD-10-CM | POA: Diagnosis not present

## 2016-03-30 ENCOUNTER — Encounter: Payer: Medicare Other | Attending: Internal Medicine | Admitting: Internal Medicine

## 2016-03-30 DIAGNOSIS — Y839 Surgical procedure, unspecified as the cause of abnormal reaction of the patient, or of later complication, without mention of misadventure at the time of the procedure: Secondary | ICD-10-CM | POA: Insufficient documentation

## 2016-03-30 DIAGNOSIS — M1990 Unspecified osteoarthritis, unspecified site: Secondary | ICD-10-CM | POA: Insufficient documentation

## 2016-03-30 DIAGNOSIS — I10 Essential (primary) hypertension: Secondary | ICD-10-CM | POA: Diagnosis not present

## 2016-03-30 DIAGNOSIS — M48061 Spinal stenosis, lumbar region without neurogenic claudication: Secondary | ICD-10-CM | POA: Diagnosis not present

## 2016-03-30 DIAGNOSIS — T8131XD Disruption of external operation (surgical) wound, not elsewhere classified, subsequent encounter: Secondary | ICD-10-CM | POA: Diagnosis not present

## 2016-03-30 DIAGNOSIS — T8189XA Other complications of procedures, not elsewhere classified, initial encounter: Secondary | ICD-10-CM | POA: Diagnosis not present

## 2016-03-30 DIAGNOSIS — L98429 Non-pressure chronic ulcer of back with unspecified severity: Secondary | ICD-10-CM | POA: Insufficient documentation

## 2016-03-30 DIAGNOSIS — J45909 Unspecified asthma, uncomplicated: Secondary | ICD-10-CM | POA: Diagnosis not present

## 2016-03-30 DIAGNOSIS — E221 Hyperprolactinemia: Secondary | ICD-10-CM | POA: Diagnosis not present

## 2016-03-31 DIAGNOSIS — M545 Low back pain: Secondary | ICD-10-CM | POA: Diagnosis not present

## 2016-03-31 NOTE — Progress Notes (Signed)
HANSEN, SWITZER ([id number]) Visit Report for 03/30/2016 Chief Complaint Document Details Patient Name: Mitchell [name] Date of Service: 03/30/2016 10:45 AM Medical Record Patient Account Number: [id number] Number: Treating RN: Montey Hora 1934-12-22 (81 y.o. Other Clinician: Date of Birth/Sex: Male) Treating Tania Perrott Primary Care Physician: Tedra Senegal Physician/Extender: G Referring Physician: Assunta Gambles in Treatment: 12 Information Obtained from: Patient Chief Complaint 01/28/16 81 year old man admitted to clinic for review of his surgical wound in his lumbar spine area Electronic Signature(s) Signed: 03/30/2016 5:30:41 PM By: Linton Ham MD Entered By: Linton Ham on 03/30/2016 12:06:18 Mitchell [name] ([id number]) -------------------------------------------------------------------------------- HPI Details Patient Name: Mitchell [name] Date of Service: 03/30/2016 10:45 AM Medical Record Patient Account Number: [id number] Number: Treating RN: Montey Hora 07-07-1934 (81 y.o. Other Clinician: Date of Birth/Sex: Male) Treating Fatina Sprankle Primary Care Physician: Tedra Senegal Physician/Extender: G Referring Physician: Assunta Gambles in Treatment: 12 History of Present Illness HPI Description: 12/31/15; this is a patient to was increasingly incapacitated over the last year with severe lumbar spinal stenosis and radiculopathy at L4-L5 L5-S1 with a large disc herniation at L2-L3. He had several spinal injections over the last year with no relief in his pain. On July 7 17 he had a redo decompression with fusion at L4-L5 L5-S1 and a laminectomy and microdiscectomy of L2-L3 with pedicle screw fixation L2-S1. Is not really clear to me at the time of this dictation as to the exact course of this wound. However he was discharged to rehabilitation at [name] and it was very clear at the end of the stay here that there  was an open area for which she had a wound VAC for a period of time. A culture on 10/29/15 showed both Pseudomonas and Klebsiella. He was discharged on ciprofloxacin and he still remains on that currently. He has home health going into his home now and doing iodoform packing. He has a follow-up with his neurosurgeon Dr. Vertell Limber tomorrow. There is still moderate amount of drainage. However the patient denies fever or chills or excessive pain. He is working hard with physical therapy in order to gain ambulatory status. He has a right foot drop for which he uses an AFO brace. As far as I can tell he has not had any advanced imaging of the low back although he apparently a has had plain x-rays in the neurosurgeon's office. No recent cultures. 01/14/16; culture from 2 weeks ago was negative. He went to sees Dr. Vertell Limber last week who is still was reluctant to proceed with any more imaging studies, they follow with him on February 08, 2023. They're using Aquacel Ag packing but still having copious amounts of clear yellowish drainage. This is being changed once a day. He has not been systemically unwell, no pain no fever and really no complaints other than the periwound is itchy 01/28/16 at this point in time patient has had a follow-up visit with Dr. Melven Sartorius neurosurgeon who is still not recommending any further advanced imaging studies at this point in time. We have been using Aquacel Ag packing although Dr. Vertell Limber didn't want him not to pack this as tightly. This is being changed daily. He has no signs or symptoms of systemic infection and his main issue is that he has a rash and itching around the periwound region. They have been using over-the-counter antifungal cream nothing prescribed up to this point. Fortunately he is not having a significant amount of pain at this point in time and  rates his discomfort to be a 1-2 out of 10 02/04/16 no major change since I last saw this wound 3 weeks ago. Depth is 6.3 cm  although his wife reports that there is much less drainage he has completed antibiotics although I don't know that we prescribed them. He had some irritation to the periwound and was given a combination of nystatin and triamcinolone apparently he developed severe irritation from this 02/17/16 this point in time patient seems to be doing well in regard to his back wound. He has had a follow- up with his neurosurgeon Dr. Vertell Limber who is pleased with how this is progressing. Fortunately he has no overall worsening symptoms and no interval signs or symptoms of infection. He tells me that the pain is also not as severe at this point. This is good news. his wife continues to perform the dressing changes at this Mitchell [name], Mitchell [name]. ([id number]) point in time. 03/02/16 surgical wound on the lower lumbar spine. Depth today is 5 cm down from 5.7 last week. This appears to be making gradual progress. There is no pain. Using iodoform packing gauze 03/16/16; surgical wound on the lower lumbar spine. Depth today is 4 cm, according to our intake there is down from 4.5 last time although I have 5 cm listed my notes. Nevertheless over time this appears to be gradually filling in. 03/30/16; 3.8 cm in depth today still using iodoform. 7.6 cm in depth on admission here. No drainage no complaints of pain Electronic Signature(s) Signed: 03/30/2016 5:30:41 PM By: Linton Ham MD Entered By: Linton Ham on 03/30/2016 12:07:22 Mitchell [name], Mitchell [name] ([id number]) -------------------------------------------------------------------------------- Physical Exam Details Patient Name: Mitchell [name] Date of Service: 03/30/2016 10:45 AM Medical Record Patient Account Number: [id number] Number: Treating RN: Montey Hora 30-Sep-1934 (81 y.o. Other Clinician: Date of Birth/Sex: Male) Treating Jane Broughton Primary Care Physician: Tedra Senegal Physician/Extender: G Referring Physician: Tedra Senegal Weeks in  Treatment: 12 Constitutional Patient is hypertensive.. Pulse regular and within target range for patient.[name] Respirations regular, non-labored and within target range.. Temperature is normal and within the target range for the patient.. Patient's appearance is neat and clean. Appears in no acute distress. Well nourished and well developed.. Notes Wound exam; 3.8 cm in depth a very slow gradual improvement. There is no tenderness no surrounding erythema. No purulent drainage Electronic Signature(s) Signed: 03/30/2016 5:30:41 PM By: Linton Ham MD Entered By: Linton Ham on 03/30/2016 12:08:26 Mitchell [name] ([id number]) -------------------------------------------------------------------------------- Physician Orders Details Patient Name: Mitchell [name] Date of Service: 03/30/2016 10:45 AM Medical Record Patient Account Number: [id number] Number: Treating RN: Cornell Barman 03-07-1935 (81 y.o. Other Clinician: Date of Birth/Sex: Male) Treating Lacy Taglieri Primary Care Physician: Tedra Senegal Physician/Extender: G Referring Physician: Assunta Gambles in Treatment: 12 Verbal / Phone Orders: Yes Clinician: Cornell Barman Read Back and Verified: Yes Diagnosis Coding Wound Cleansing Wound #1 Midline Back o Clean wound with Normal Saline. o May Shower, gently pat wound dry prior to applying new dressing. Primary Wound Dressing Wound #1 Midline Back o Iodoform packing Gauze Secondary Dressing Wound #1 Midline Back o Boardered Foam Dressing - Allevyn Life or Equal to protect surrounding skin from tape damage. o Drawtex - HHRN please provide this for patient Dressing Change Frequency Wound #1 Midline Back o Change dressing every day. Follow-up Appointments Wound #1 Midline Back o Return Appointment in 2 weeks. Electronic Signature(s) Signed: 03/30/2016 1:38:12 PM By: Gretta Cool RN, BSN, Kim RN, BSN Signed: 03/30/2016 5:30:41 PM By: Linton Ham  MD Entered By: Gretta Cool, RN, BSN, Kim on 03/30/2016 11:38:45 Mitchell [name], Mitchell [name] ([id number]) -------------------------------------------------------------------------------- Problem List Details Patient Name: Mitchell [name] Date of Service: 03/30/2016 10:45 AM Medical Record Patient Account Number: [id number] Number: Treating RN: Montey Hora 1934-10-12 (81 y.o. Other Clinician: Date of Birth/Sex: Male) Treating Tayshaun Kroh Primary Care Physician: Tedra Senegal Physician/Extender: G Referring Physician: Assunta Gambles in Treatment: 12 Active Problems ICD-10 Encounter Code Description Active Date Diagnosis T81.31XD Disruption of external operation (surgical) wound, not 12/31/2015 Yes elsewhere classified, subsequent encounter L98.429 Non-pressure chronic ulcer of back with unspecified 12/31/2015 Yes severity M48.06 Spinal stenosis, lumbar region 12/31/2015 Yes Inactive Problems Resolved Problems Electronic Signature(s) Signed: 03/30/2016 5:30:41 PM By: Linton Ham MD Entered By: Linton Ham on 03/30/2016 12:06:03 Mitchell [name] ([id number]) -------------------------------------------------------------------------------- Progress Note Details Patient Name: Mitchell [name] Date of Service: 03/30/2016 10:45 AM Medical Record Patient Account Number: [id number] Number: Treating RN: Montey Hora 1935-04-10 (81 y.o. Other Clinician: Date of Birth/Sex: Male) Treating Habeeb Puertas Primary Care Physician: Tedra Senegal Physician/Extender: G Referring Physician: Assunta Gambles in Treatment: 12 Subjective Chief Complaint Information obtained from Patient 01/28/16 81 year old man admitted to clinic for review of his surgical wound in his lumbar spine area History of Present Illness (HPI) 12/31/15; this is a patient to was increasingly incapacitated over the last year with severe lumbar spinal stenosis and radiculopathy at L4-L5 L5-S1  with a large disc herniation at L2-L3. He had several spinal injections over the last year with no relief in his pain. On July 7 17 he had a redo decompression with fusion at L4-L5 L5-S1 and a laminectomy and microdiscectomy of L2-L3 with pedicle screw fixation L2-S1. Is not really clear to me at the time of this dictation as to the exact course of this wound. However he was discharged to rehabilitation at [facility] and it was very clear at the end of the stay here that there was an open area for which she had a wound VAC for a period of time. A culture on 10/29/15 showed both Pseudomonas and Klebsiella. He was discharged on ciprofloxacin and he still remains on that currently. He has home health going into his home now and doing iodoform packing. He has a follow-up with his neurosurgeon Dr. Vertell Limber tomorrow. There is still moderate amount of drainage. However the patient denies fever or chills or excessive pain. He is working hard with physical therapy in order to gain ambulatory status. He has a right foot drop for which he uses an AFO brace. As far as I can tell he has not had any advanced imaging of the low back although he apparently a has had plain x-rays in the neurosurgeon's office. No recent cultures. 01/14/16; culture from 2 weeks ago was negative. He went to sees Dr. Vertell Limber last week who is still was reluctant to proceed with any more imaging studies, they follow with him on 02/16/23. They're using Aquacel Ag packing but still having copious amounts of clear yellowish drainage. This is being changed once a day. He has not been systemically unwell, no pain no fever and really no complaints other than the periwound is itchy 01/28/16 at this point in time patient has had a follow-up visit with Dr. Melven Sartorius neurosurgeon who is still not recommending any further advanced imaging studies at this point in time. We have been using Aquacel Ag packing although Dr. Vertell Limber didn't want him not to  pack this as tightly. This is being changed daily. He  has no signs or symptoms of systemic infection and his main issue is that he has a rash and itching around the periwound region. They have been using over-the-counter antifungal cream nothing prescribed up to this point. Fortunately he is not having a significant amount of pain at this point in time and rates his discomfort to be a 1-2 out of 10 02/04/16 no major change since I last saw this wound 3 weeks ago. Depth is 6.3 cm although his wife reports that there is much less drainage he has completed antibiotics although I don't know that we prescribed them. He had some irritation to the periwound and was given a combination of nystatin and [place], Mitchell [name]. ([id number]) triamcinolone apparently he developed severe irritation from this 02/17/16 this point in time patient seems to be doing well in regard to his back wound. He has had a follow- up with his neurosurgeon Dr. Vertell Limber who is pleased with how this is progressing. Fortunately he has no overall worsening symptoms and no interval signs or symptoms of infection. He tells me that the pain is also not as severe at this point. This is good news. his wife continues to perform the dressing changes at this point in time. 03/02/16 surgical wound on the lower lumbar spine. Depth today is 5 cm down from 5.7 last week. This appears to be making gradual progress. There is no pain. Using iodoform packing gauze 03/16/16; surgical wound on the lower lumbar spine. Depth today is 4 cm, according to our intake there is down from 4.5 last time although I have 5 cm listed my notes. Nevertheless over time this appears to be gradually filling in. 03/30/16; 3.8 cm in depth today still using iodoform. 7.6 cm in depth on admission here. No drainage no complaints of pain Objective Constitutional Patient is hypertensive.. Pulse regular and within target range for patient.[name] Respirations regular, non-labored and  within target range.. Temperature is normal and within the target range for the patient.. Patient's appearance is neat and clean. Appears in no acute distress. Well nourished and well developed.. Vitals Time Taken: 11:07 AM, Height: 75 in, Weight: 272 lbs, BMI: 34, Pulse: 116 bpm, Respiratory Rate: 18 breaths/min, Blood Pressure: 140/93 mmHg. General Notes: Wound exam; 3.8 cm in depth a very slow gradual improvement. There is no tenderness no surrounding erythema. No purulent drainage Integumentary (Hair, Skin) Wound #1 status is Open. Original cause of wound was Surgical Injury. The wound is located on the Midline Back. The wound measures 0.5cm length x 0.2cm width x 3.8cm depth; 0.079cm^2 area and 0.298cm^3 volume. There is a medium amount of serous drainage noted. The wound margin is indistinct and nonvisible. The periwound skin appearance exhibited: Moist. The periwound skin appearance did not exhibit: Callus, Crepitus, Excoriation, Fluctuance, Friable, Induration, Localized Edema, Rash, Scarring, Dry/Scaly, Maceration, Atrophie Blanche, Cyanosis, Ecchymosis, Hemosiderin Staining, Mottled, Pallor, Rubor, Erythema. General Notes: Unable to visualize wound bed. Assessment Mitchell [name], Mitchell [name]. ([id number]) Active Problems ICD-10 T81.31XD - Disruption of external operation (surgical) wound, not elsewhere classified, subsequent encounter L98.429 - Non-pressure chronic ulcer of back with unspecified severity M48.06 - Spinal stenosis, lumbar region Plan Wound Cleansing: Wound #1 Midline Back: Clean wound with Normal Saline. May Shower, gently pat wound dry prior to applying new dressing. Primary Wound Dressing: Wound #1 Midline Back: Iodoform packing Gauze Secondary Dressing: Wound #1 Midline Back: Boardered Foam Dressing - Allevyn Life or Equal to protect surrounding skin from tape damage. Drawtex - HHRN please provide this for patient Dressing  Change Frequency: Wound #1 Midline  Back: Change dressing every day. Follow-up Appointments: Wound #1 Midline Back: Return Appointment in 2 weeks. no change to the current dressing orders. Iodoform, Electronic Signature(s) Signed: 03/30/2016 5:30:41 PM By: Linton Ham MD Entered By: Linton Ham on 03/30/2016 12:11:49 Mitchell [name], Mitchell [name] ([id number]) -------------------------------------------------------------------------------- SuperBill Details Patient Name: Mitchell [name] Date of Service: 03/30/2016 Medical Record Patient Account Number: [id number] Number: Treating RN: Montey Hora Sep 15, 1934 (81 y.o. Other Clinician: Date of Birth/Sex: Male) Treating Elfrieda Espino, [place] Primary Care Physician: Tedra Senegal Physician/Extender: G Referring Physician: Tedra Senegal Weeks in Treatment: 12 Diagnosis Coding ICD-10 Codes Code Description Disruption of external operation (surgical) wound, not elsewhere classified, subsequent T81.31XD encounter L98.429 Non-pressure chronic ulcer of back with unspecified severity M48.06 Spinal stenosis, lumbar region Facility Procedures CPT4 Code: [id number] Description: [phone] - WOUND CARE VISIT-LEV 2 EST PT Modifier: Quantity: 1 Physician Procedures CPT4: Description Modifier Quantity Code [id number] - WC PHYS LEVEL 2 - EST PT 1 ICD-10 Description Diagnosis T81.31XD Disruption of external operation (surgical) wound, not elsewhere classified, subsequent encounter L98.429 Non-pressure chronic ulcer  of back with unspecified severity Electronic Signature(s) Signed: 03/30/2016 5:30:41 PM By: Linton Ham MD Entered By: Linton Ham on 03/30/2016 12:12:23

## 2016-03-31 NOTE — Progress Notes (Signed)
THEOPOLIS, AMESQUITA ([id number]) Visit Report for 03/30/2016 Arrival Information Details Patient Name: Mitchell [name], Mitchell [name] Date of Service: 03/30/2016 10:45 AM Medical Record Number: [id number] Patient Account Number: [id number] Date of Birth/Sex: 1935-04-17 (81 y.o. Male) Treating RN: Cornell Barman Primary Care Physician: Tedra Senegal Other Clinician: Referring Physician: Tedra Senegal Treating Physician/Extender: Tito Dine in Treatment: 12 Visit Information History Since Last Visit Added or deleted any medications: Yes Patient Arrived: Walker Any new allergies or adverse reactions: No Arrival Time: 11:06 Had a fall or experienced change in No Accompanied By: wife activities of daily living that may affect Transfer Assistance: Manual risk of falls: Patient Identification Verified: Yes Signs or symptoms of abuse/neglect since last No Secondary Verification Process Completed: Yes visito Patient Requires Transmission-Based No Hospitalized since last visit: No Precautions: Has Dressing in Place as Prescribed: Yes Patient Has Alerts: No Pain Present Now: No Electronic Signature(s) Signed: 03/30/2016 1:38:12 PM By: Gretta Cool, RN, BSN, Kim RN, BSN Entered By: Gretta Cool, RN, BSN, Kim on 03/30/2016 11:07:34 Mitchell [name] ([id number]) -------------------------------------------------------------------------------- Clinic Level of Care Assessment Details Patient Name: Mitchell [name] Date of Service: 03/30/2016 10:45 AM Medical Record Number: [id number] Patient Account Number: [id number] Date of Birth/Sex: 1935/03/28 (81 y.o. Male) Treating RN: Cornell Barman Primary Care Physician: Tedra Senegal Other Clinician: Referring Physician: Tedra Senegal Treating Physician/Extender: Tito Dine in Treatment: 12 Clinic Level of Care Assessment Items TOOL 4 Quantity Score []  - Use when only an EandM is performed on FOLLOW-UP visit 0 ASSESSMENTS - Nursing Assessment /  Reassessment []  - Reassessment of Co-morbidities (includes updates in patient status) 0 X - Reassessment of Adherence to Treatment Plan 1 5 ASSESSMENTS - Wound and Skin Assessment / Reassessment X - Simple Wound Assessment / Reassessment - one wound 1 5 []  - Complex Wound Assessment / Reassessment - multiple wounds 0 []  - Dermatologic / Skin Assessment (not related to wound area) 0 ASSESSMENTS - Focused Assessment []  - Circumferential Edema Measurements - multi extremities 0 []  - Nutritional Assessment / Counseling / Intervention 0 []  - Lower Extremity Assessment (monofilament, tuning fork, pulses) 0 []  - Peripheral Arterial Disease Assessment (using hand held doppler) 0 ASSESSMENTS - Ostomy and/or Continence Assessment and Care []  - Incontinence Assessment and Management 0 []  - Ostomy Care Assessment and Management (repouching, etc.) 0 PROCESS - Coordination of Care X - Simple Patient / Family Education for ongoing care 1 15 []  - Complex (extensive) Patient / Family Education for ongoing care 0 []  - Staff obtains Programmer, systems, Records, Test Results / Process Orders 0 []  - Staff telephones HHA, Nursing Homes / Clarify orders / etc 0 []  - Routine Transfer to another Facility (non-emergent condition) 0 Hallahan, Blakely ([id number]) []  - Routine Hospital Admission (non-emergent condition) 0 []  - New Admissions / Biomedical engineer / Ordering NPWT, Apligraf, etc. 0 []  - Emergency Hospital Admission (emergent condition) 0 X - Simple Discharge Coordination 1 10 []  - Complex (extensive) Discharge Coordination 0 PROCESS - Special Needs []  - Pediatric / Minor Patient Management 0 []  - Isolation Patient Management 0 []  - Hearing / Language / Visual special needs 0 []  - Assessment of Community assistance (transportation, D/C planning, etc.) 0 []  - Additional assistance / Altered mentation 0 []  - Support Surface(s) Assessment (bed, cushion, seat, etc.) 0 INTERVENTIONS - Wound Cleansing /  Measurement X - Simple Wound Cleansing - one wound 1 5 []  - Complex Wound Cleansing - multiple wounds 0 X - Wound Imaging (photographs - any number  of wounds) 1 5 []  - Wound Tracing (instead of photographs) 0 X - Simple Wound Measurement - one wound 1 5 []  - Complex Wound Measurement - multiple wounds 0 INTERVENTIONS - Wound Dressings X - Small Wound Dressing one or multiple wounds 1 10 []  - Medium Wound Dressing one or multiple wounds 0 []  - Large Wound Dressing one or multiple wounds 0 []  - Application of Medications - topical 0 []  - Application of Medications - injection 0 INTERVENTIONS - Miscellaneous []  - External ear exam 0 Buechler, Martavion W. ([id number]) []  - Specimen Collection (cultures, biopsies, blood, body fluids, etc.) 0 []  - Specimen(s) / Culture(s) sent or taken to Lab for analysis 0 []  - Patient Transfer (multiple staff / Harrel Lemon Lift / Similar devices) 0 []  - Simple Staple / Suture removal (25 or less) 0 []  - Complex Staple / Suture removal (26 or more) 0 []  - Hypo / Hyperglycemic Management (close monitor of Blood Glucose) 0 []  - Ankle / Brachial Index (ABI) - do not check if billed separately 0 X - Vital Signs 1 5 Has the patient been seen at the hospital within the last three years: Yes Total Score: 65 Level Of Care: New/Established - Level 2 Electronic Signature(s) Signed: 03/30/2016 1:38:12 PM By: Gretta Cool, RN, BSN, Kim RN, BSN Entered By: Gretta Cool, RN, BSN, Kim on 03/30/2016 11:39:08 Mitchell [name] ([id number]) -------------------------------------------------------------------------------- Encounter Discharge Information Details Patient Name: Mitchell [name] Date of Service: 03/30/2016 10:45 AM Medical Record Number: [id number] Patient Account Number: [id number] Date of Birth/Sex: 02-09-1935 (81 y.o. Male) Treating RN: Montey Hora Primary Care Physician: Tedra Senegal Other Clinician: Referring Physician: Tedra Senegal Treating Physician/Extender: Tito Dine in Treatment: 12 Encounter Discharge Information Items Discharge Pain Level: 0 Discharge Condition: Stable Ambulatory Status: Walker Discharge Destination: Home Transportation: Private Auto Accompanied By: wife Schedule Follow-up Appointment: Yes Medication Reconciliation completed and provided to Patient/Care Yes Kharon Hixon: Provided on Clinical Summary of Care: 03/30/2016 Form Type Recipient Paper Patient HD Electronic Signature(s) Signed: 03/30/2016 1:38:12 PM By: Gretta Cool, RN, BSN, Kim RN, BSN Previous Signature: 03/30/2016 11:38:59 AM Version By: Ruthine Dose Entered By: Gretta Cool RN, BSN, Kim on 03/30/2016 11:40:05 INDERJIT, HOKENSON ([id number]) -------------------------------------------------------------------------------- Multi Wound Chart Details Patient Name: Mitchell [name] Date of Service: 03/30/2016 10:45 AM Medical Record Number: [id number] Patient Account Number: [id number] Date of Birth/Sex: 1934/06/11 (81 y.o. Male) Treating RN: Cornell Barman Primary Care Physician: Tedra Senegal Other Clinician: Referring Physician: Tedra Senegal Treating Physician/Extender: Ricard Dillon Weeks in Treatment: 12 Vital Signs Height(in): 75 Pulse(bpm): 116 Weight(lbs): 272 Blood Pressure 140/93 (mmHg): Body Mass Index(BMI): 34 Temperature(F): Respiratory Rate 18 (breaths/min): Photos: [1:No Photos] [N/A:N/A] Wound Location: [1:Back - Midline] [N/A:N/A] Wounding Event: [1:Surgical Injury] [N/A:N/A] Primary Etiology: [1:Open Surgical Wound] [N/A:N/A] Comorbid History: [1:Cataracts, Asthma, Arrhythmia, Hypertension, Osteoarthritis] [N/A:N/A] Date Acquired: [1:10/24/2015] [N/A:N/A] Weeks of Treatment: [1:12] [N/A:N/A] Wound Status: [1:Open] [N/A:N/A] Measurements L x W x D 0.5x0.2x3.8 [N/A:N/A] (cm) Area (cm) : [1:0.079] [N/A:N/A] Volume (cm) : [1:0.298] [N/A:N/A] % Reduction in Area: [1:77.20%] [N/A:N/A] % Reduction in Volume: 88.70%  [N/A:N/A] Classification: [1:Full Thickness Without Exposed Support Structures] [N/A:N/A] Exudate Amount: [1:Medium] [N/A:N/A] Exudate Type: [1:Serous] [N/A:N/A] Exudate Color: [1:amber] [N/A:N/A] Wound Margin: [1:Indistinct, nonvisible] [N/A:N/A] Periwound Skin Texture: Edema: No [1:Excoriation: No Induration: No Callus: No Crepitus: No Fluctuance: No Friable: No] [N/A:N/A] Rash: No Scarring: No Periwound Skin Moist: Yes N/A N/A Moisture: Maceration: No Dry/Scaly: No Periwound Skin Color: Atrophie Blanche: No N/A N/A Cyanosis: No Ecchymosis: No Erythema: No Hemosiderin  Staining: No Mottled: No Pallor: No Rubor: No Tenderness on No N/A N/A Palpation: Wound Preparation: Ulcer Cleansing: N/A N/A Rinsed/Irrigated with Saline Topical Anesthetic Applied: None Assessment Notes: Unable to visualize wound N/A N/A bed. Treatment Notes Electronic Signature(s) Signed: 03/30/2016 1:38:12 PM By: Gretta Cool, RN, BSN, Kim RN, BSN Entered By: Gretta Cool, RN, BSN, Kim on 03/30/2016 11:27:23 RYOSUKE, DENATALE ([id number]) -------------------------------------------------------------------------------- [place] Details Patient Name: Mitchell [name] Date of Service: 03/30/2016 10:45 AM Medical Record Number: [id number] Patient Account Number: [id number] Date of Birth/Sex: [birth year] (81 y.o. Male) Treating RN: Cornell Barman Primary Care Physician: Tedra Senegal Other Clinician: Referring Physician: Tedra Senegal Treating Physician/Extender: Tito Dine in Treatment: 12 Active Inactive Abuse / Safety / Falls / Self Care Management Nursing Diagnoses: Impaired physical mobility Potential for falls Goals: Patient will remain injury free Date Initiated: 12/31/2015 Goal Status: Active Interventions: Assess fall risk on admission and as needed Notes: Orientation to the Wound Care Program Nursing Diagnoses: Knowledge deficit related to the wound healing center  program Goals: Patient/caregiver will verbalize understanding of the [facility] Program Date Initiated: 12/31/2015 Goal Status: Active Interventions: Provide education on orientation to the wound center Notes: Wound/Skin Impairment Nursing Diagnoses: Impaired tissue integrity Goals: Patient/caregiver will verbalize understanding of skin care regimen KIEON, COLETTI ([id number]) Date Initiated: 12/31/2015 Goal Status: Active Ulcer/skin breakdown will have a volume reduction of 30% by week 4 Date Initiated: 12/31/2015 Goal Status: Active Ulcer/skin breakdown will have a volume reduction of 50% by week 8 Date Initiated: 12/31/2015 Goal Status: Active Ulcer/skin breakdown will have a volume reduction of 80% by week 12 Date Initiated: 12/31/2015 Goal Status: Active Ulcer/skin breakdown will heal within 14 weeks Date Initiated: 12/31/2015 Goal Status: Active Interventions: Assess patient/caregiver ability to obtain necessary supplies Assess patient/caregiver ability to perform ulcer/skin care regimen upon admission and as needed Assess ulceration(s) every visit Notes: Electronic Signature(s) Signed: 03/30/2016 1:38:12 PM By: Gretta Cool, RN, BSN, Kim RN, BSN Entered By: Gretta Cool, RN, BSN, Kim on 03/30/2016 11:27:10 Mitchell [name] ([id number]) -------------------------------------------------------------------------------- Pain Assessment Details Patient Name: Mitchell [name] Date of Service: 03/30/2016 10:45 AM Medical Record Number: [id number] Patient Account Number: [id number] Date of Birth/Sex: [birth year] (81 y.o. Male) Treating RN: Cornell Barman Primary Care Physician: Tedra Senegal Other Clinician: Referring Physician: Tedra Senegal Treating Physician/Extender: Tito Dine in Treatment: 12 Active Problems Location of Pain Severity and Description of Pain Patient Has Paino No Site Locations With Dressing Change: No Pain Management and Medication Current Pain  Management: Notes Topical or injectable lidocaine is offered to patient for acute pain when surgical debridement is performed. If needed, Patient is instructed to use over the counter pain medication for the following 24-48 hours after debridement. Wound care MDs do not prescribed pain medications. Patient has chronic pain or uncontrolled pain. Patient has been instructed to make an appointment with their Primary Care Physician for pain management. Electronic Signature(s) Signed: 03/30/2016 1:38:12 PM By: Gretta Cool, RN, BSN, Kim RN, BSN Entered By: Gretta Cool, RN, BSN, Kim on 03/30/2016 11:07:52 IVEY, BUNGERT ([id number]) -------------------------------------------------------------------------------- Patient/Caregiver Education Details Patient Name: Mitchell [name] Date of Service: 03/30/2016 10:45 AM Medical Record Number: [id number] Patient Account Number: [id number] Date of Birth/Gender: May 23, 1934 (81 y.o. Male) Treating RN: Cornell Barman Primary Care Physician: Tedra Senegal Other Clinician: Referring Physician: Tedra Senegal Treating Physician/Extender: Tito Dine in Treatment: 12 Education Assessment Education Provided To: Patient Education Topics Provided Wound/Skin Impairment: Handouts: Caring for Your Ulcer Methods: Demonstration, Explain/Verbal Responses: State  content correctly Electronic Signature(s) Signed: 03/30/2016 1:38:12 PM By: Gretta Cool, RN, BSN, Kim RN, BSN Entered By: Gretta Cool, RN, BSN, Kim on 03/30/2016 11:40:16 Mitchell [name] ([id number]) -------------------------------------------------------------------------------- Wound Assessment Details Patient Name: Mitchell [name] Date of Service: 03/30/2016 10:45 AM Medical Record Number: [id number] Patient Account Number: [id number] Date of Birth/Sex: 03/26/1935 (81 y.o. Male) Treating RN: Cornell Barman Primary Care Physician: Tedra Senegal Other Clinician: Referring Physician: Tedra Senegal Treating  Physician/Extender: Tito Dine in Treatment: 12 Wound Status Wound Number: 1 Primary Open Surgical Wound Etiology: Wound Location: Back - Midline Wound Open Wounding Event: Surgical Injury Status: Date Acquired: 10/24/2015 Comorbid Cataracts, Asthma, Arrhythmia, Weeks Of Treatment: 12 History: Hypertension, Osteoarthritis Clustered Wound: No Photos Photo Uploaded By: Gretta Cool, RN, BSN, Kim on 03/30/2016 11:34:57 Wound Measurements Length: (cm) 0.5 Width: (cm) 0.2 Depth: (cm) 3.8 Area: (cm) 0.079 Volume: (cm) 0.298 % Reduction in Area: 77.2% % Reduction in Volume: 88.7% Wound Description Full Thickness Without Exposed Classification: Support Structures Wound Margin: Indistinct, nonvisible Exudate Medium Amount: Exudate Type: Serous Exudate Color: amber Periwound Skin Texture Texture Color No Abnormalities Noted: No No Abnormalities Noted: No Callus: No Atrophie Blanche: No Isenberg, Vernard W. ([id number]) Crepitus: No Cyanosis: No Excoriation: No Ecchymosis: No Fluctuance: No Erythema: No Friable: No Hemosiderin Staining: No Induration: No Mottled: No Localized Edema: No Pallor: No Rash: No Rubor: No Scarring: No Moisture No Abnormalities Noted: No Dry / Scaly: No Maceration: No Moist: Yes Wound Preparation Ulcer Cleansing: Rinsed/Irrigated with Saline Topical Anesthetic Applied: None Assessment Notes Unable to visualize wound bed. Treatment Notes Wound #1 (Midline Back) 1. Cleansed with: Clean wound with Normal Saline 3. Peri-wound Care: Skin Prep 4. Dressing Applied: Iodoform packing Gauze 5. Secondary Dressing Applied Bordered Foam Dressing Electronic Signature(s) Signed: 03/30/2016 1:38:12 PM By: Gretta Cool, RN, BSN, Kim RN, BSN Entered By: Gretta Cool, RN, BSN, Kim on 03/30/2016 11:16:24 SHAIN, CERASOLI ([id number]) -------------------------------------------------------------------------------- [place] Details Patient Name: Mitchell [name] Date of Service: 03/30/2016 10:45 AM Medical Record Number: [id number] Patient Account Number: [id number] Date of Birth/Sex: 12/08/1934 (81 y.o. Male) Treating RN: Cornell Barman Primary Care Physician: Tedra Senegal Other Clinician: Referring Physician: Tedra Senegal Treating Physician/Extender: Tito Dine in Treatment: 12 Vital Signs Time Taken: 11:07 Pulse (bpm): 116 Height (in): 75 Respiratory Rate (breaths/min): 18 Weight (lbs): 272 Blood Pressure (mmHg): 140/93 Body Mass Index (BMI): 34 Reference Range: 80 - 120 mg / dl Electronic Signature(s) Signed: 03/30/2016 1:38:12 PM By: Gretta Cool, RN, BSN, Kim RN, BSN Entered By: Gretta Cool, RN, BSN, Kim on 03/30/2016 11:08:14

## 2016-04-01 ENCOUNTER — Ambulatory Visit: Payer: Medicare Other | Admitting: Internal Medicine

## 2016-04-02 DIAGNOSIS — M545 Low back pain: Secondary | ICD-10-CM | POA: Diagnosis not present

## 2016-04-02 DIAGNOSIS — M5116 Intervertebral disc disorders with radiculopathy, lumbar region: Secondary | ICD-10-CM | POA: Diagnosis not present

## 2016-04-06 ENCOUNTER — Encounter: Payer: Self-pay | Admitting: Internal Medicine

## 2016-04-06 ENCOUNTER — Ambulatory Visit (INDEPENDENT_AMBULATORY_CARE_PROVIDER_SITE_OTHER): Payer: Medicare Other | Admitting: Internal Medicine

## 2016-04-06 VITALS — BP 130/88 | HR 116 | Temp 98.4°F | Wt 275.0 lb

## 2016-04-06 DIAGNOSIS — E784 Other hyperlipidemia: Secondary | ICD-10-CM

## 2016-04-06 DIAGNOSIS — I2510 Atherosclerotic heart disease of native coronary artery without angina pectoris: Secondary | ICD-10-CM

## 2016-04-06 DIAGNOSIS — R609 Edema, unspecified: Secondary | ICD-10-CM

## 2016-04-06 DIAGNOSIS — Z8709 Personal history of other diseases of the respiratory system: Secondary | ICD-10-CM | POA: Diagnosis not present

## 2016-04-06 DIAGNOSIS — E221 Hyperprolactinemia: Secondary | ICD-10-CM | POA: Diagnosis not present

## 2016-04-06 DIAGNOSIS — D443 Neoplasm of uncertain behavior of pituitary gland: Secondary | ICD-10-CM | POA: Diagnosis not present

## 2016-04-06 DIAGNOSIS — E291 Testicular hypofunction: Secondary | ICD-10-CM | POA: Diagnosis not present

## 2016-04-06 DIAGNOSIS — E7849 Other hyperlipidemia: Secondary | ICD-10-CM

## 2016-04-06 DIAGNOSIS — R9389 Abnormal findings on diagnostic imaging of other specified body structures: Secondary | ICD-10-CM

## 2016-04-06 DIAGNOSIS — I10 Essential (primary) hypertension: Secondary | ICD-10-CM

## 2016-04-06 DIAGNOSIS — R938 Abnormal findings on diagnostic imaging of other specified body structures: Secondary | ICD-10-CM

## 2016-04-06 DIAGNOSIS — M899 Disorder of bone, unspecified: Secondary | ICD-10-CM | POA: Diagnosis not present

## 2016-04-07 DIAGNOSIS — I10 Essential (primary) hypertension: Secondary | ICD-10-CM | POA: Diagnosis not present

## 2016-04-07 DIAGNOSIS — M5416 Radiculopathy, lumbar region: Secondary | ICD-10-CM | POA: Diagnosis not present

## 2016-04-07 DIAGNOSIS — T8189XD Other complications of procedures, not elsewhere classified, subsequent encounter: Secondary | ICD-10-CM | POA: Diagnosis not present

## 2016-04-07 DIAGNOSIS — M48062 Spinal stenosis, lumbar region with neurogenic claudication: Secondary | ICD-10-CM | POA: Diagnosis not present

## 2016-04-07 DIAGNOSIS — M545 Low back pain: Secondary | ICD-10-CM | POA: Diagnosis not present

## 2016-04-09 DIAGNOSIS — M545 Low back pain: Secondary | ICD-10-CM | POA: Diagnosis not present

## 2016-04-13 ENCOUNTER — Encounter: Payer: Medicare Other | Admitting: Internal Medicine

## 2016-04-13 DIAGNOSIS — L98429 Non-pressure chronic ulcer of back with unspecified severity: Secondary | ICD-10-CM | POA: Diagnosis not present

## 2016-04-13 DIAGNOSIS — M1990 Unspecified osteoarthritis, unspecified site: Secondary | ICD-10-CM | POA: Diagnosis not present

## 2016-04-13 DIAGNOSIS — M48061 Spinal stenosis, lumbar region without neurogenic claudication: Secondary | ICD-10-CM | POA: Diagnosis not present

## 2016-04-13 DIAGNOSIS — T8189XA Other complications of procedures, not elsewhere classified, initial encounter: Secondary | ICD-10-CM | POA: Diagnosis not present

## 2016-04-13 DIAGNOSIS — J45909 Unspecified asthma, uncomplicated: Secondary | ICD-10-CM | POA: Diagnosis not present

## 2016-04-13 DIAGNOSIS — T8131XD Disruption of external operation (surgical) wound, not elsewhere classified, subsequent encounter: Secondary | ICD-10-CM | POA: Diagnosis not present

## 2016-04-13 DIAGNOSIS — I10 Essential (primary) hypertension: Secondary | ICD-10-CM | POA: Diagnosis not present

## 2016-04-13 NOTE — Progress Notes (Signed)
GRADIE, TRUMPY ([id number]) Visit Report for 04/13/2016 Arrival Information Details Patient Name: Mitchell [name], Mitchell [name] Date of Service: 04/13/2016 10:45 AM Medical Record Number: [id number] Patient Account Number: [id number] Date of Birth/Sex: [birth year] (81 y.o. Male) Treating RN: Montey Hora Primary Care Physician: Tedra Senegal Other Clinician: Referring Physician: Tedra Senegal Treating Physician/Extender: Tito Dine in Treatment: 14 Visit Information History Since Last Visit Added or deleted any medications: No Patient Arrived: Walker Any new allergies or adverse reactions: No Arrival Time: 11:09 Had a fall or experienced change in No Accompanied By: spouse activities of daily living that may affect Transfer Assistance: None risk of falls: Patient Identification Verified: Yes Signs or symptoms of abuse/neglect since last No Secondary Verification Process Completed: Yes visito Patient Requires Transmission-Based No Hospitalized since last visit: No Precautions: Pain Present Now: No Patient Has Alerts: No Electronic Signature(s) Signed: 04/13/2016 4:01:24 PM By: Montey Hora Entered By: Montey Hora on 04/13/2016 11:10:06 Mitchell [name] ([id number]) -------------------------------------------------------------------------------- Clinic Level of Care Assessment Details Patient Name: Mitchell [name] Date of Service: 04/13/2016 10:45 AM Medical Record Number: [id number] Patient Account Number: [id number] Date of Birth/Sex: [birth year] (81 y.o. Male) Treating RN: Montey Hora Primary Care Physician: Tedra Senegal Other Clinician: Referring Physician: Tedra Senegal Treating Physician/Extender: Tito Dine in Treatment: 14 Clinic Level of Care Assessment Items TOOL 4 Quantity Score []  - Use when only an EandM is performed on FOLLOW-UP visit 0 ASSESSMENTS - Nursing Assessment / Reassessment X - Reassessment of Co-morbidities (includes  updates in patient status) 1 10 X - Reassessment of Adherence to Treatment Plan 1 5 ASSESSMENTS - Wound and Skin Assessment / Reassessment X - Simple Wound Assessment / Reassessment - one wound 1 5 []  - Complex Wound Assessment / Reassessment - multiple wounds 0 []  - Dermatologic / Skin Assessment (not related to wound area) 0 ASSESSMENTS - Focused Assessment []  - Circumferential Edema Measurements - multi extremities 0 []  - Nutritional Assessment / Counseling / Intervention 0 []  - Lower Extremity Assessment (monofilament, tuning fork, pulses) 0 []  - Peripheral Arterial Disease Assessment (using hand held doppler) 0 ASSESSMENTS - Ostomy and/or Continence Assessment and Care []  - Incontinence Assessment and Management 0 []  - Ostomy Care Assessment and Management (repouching, etc.) 0 PROCESS - Coordination of Care X - Simple Patient / Family Education for ongoing care 1 15 []  - Complex (extensive) Patient / Family Education for ongoing care 0 []  - Staff obtains Programmer, systems, Records, Test Results / Process Orders 0 []  - Staff telephones HHA, Nursing Homes / Clarify orders / etc 0 []  - Routine Transfer to another Facility (non-emergent condition) 0 Mitchell [name], [name] ([id number]) []  - Routine Hospital Admission (non-emergent condition) 0 []  - New Admissions / Biomedical engineer / Ordering NPWT, Apligraf, etc. 0 []  - Emergency Hospital Admission (emergent condition) 0 X - Simple Discharge Coordination 1 10 []  - Complex (extensive) Discharge Coordination 0 PROCESS - Special Needs []  - Pediatric / Minor Patient Management 0 []  - Isolation Patient Management 0 []  - Hearing / Language / Visual special needs 0 []  - Assessment of Community assistance (transportation, D/C planning, etc.) 0 []  - Additional assistance / Altered mentation 0 []  - Support Surface(s) Assessment (bed, cushion, seat, etc.) 0 INTERVENTIONS - Wound Cleansing / Measurement X - Simple Wound Cleansing - one wound 1 5 []  -  Complex Wound Cleansing - multiple wounds 0 X - Wound Imaging (photographs - any number of wounds) 1 5 []  - Wound Tracing (instead of photographs) 0  X - Simple Wound Measurement - one wound 1 5 []  - Complex Wound Measurement - multiple wounds 0 INTERVENTIONS - Wound Dressings X - Small Wound Dressing one or multiple wounds 1 10 []  - Medium Wound Dressing one or multiple wounds 0 []  - Large Wound Dressing one or multiple wounds 0 []  - Application of Medications - topical 0 []  - Application of Medications - injection 0 INTERVENTIONS - Miscellaneous []  - External ear exam 0 Mitchell [name], [name] ([id number]) []  - Specimen Collection (cultures, biopsies, blood, body fluids, etc.) 0 []  - Specimen(s) / Culture(s) sent or taken to Lab for analysis 0 []  - Patient Transfer (multiple staff / Harrel Lemon Lift / Similar devices) 0 []  - Simple Staple / Suture removal (25 or less) 0 []  - Complex Staple / Suture removal (26 or more) 0 []  - Hypo / Hyperglycemic Management (close monitor of Blood Glucose) 0 []  - Ankle / Brachial Index (ABI) - do not check if billed separately 0 X - Vital Signs 1 5 Has the patient been seen at the hospital within the last three years: Yes Total Score: 75 Level Of Care: New/Established - Level 2 Electronic Signature(s) Signed: 04/13/2016 4:01:24 PM By: Montey Hora Entered By: Montey Hora on 04/13/2016 11:41:42 Mitchell [name], Mitchell [name] ([id number]) -------------------------------------------------------------------------------- Encounter Discharge Information Details Patient Name: Mitchell [name] Date of Service: 04/13/2016 10:45 AM Medical Record Number: [id number] Patient Account Number: [id number] Date of Birth/Sex: 1934/09/07 (81 y.o. Male) Treating RN: Montey Hora Primary Care Physician: Tedra Senegal Other Clinician: Referring Physician: Tedra Senegal Treating Physician/Extender: Tito Dine in Treatment: 14 Encounter Discharge Information Items Discharge  Pain Level: 0 Discharge Condition: Stable Ambulatory Status: Walker Discharge Destination: Home Transportation: Private Auto Accompanied By: spouse Schedule Follow-up Appointment: Yes Medication Reconciliation completed and provided to Patient/Care No Lorrene Graef: Provided on Clinical Summary of Care: 04/13/2016 Form Type Recipient Paper Patient HD Electronic Signature(s) Signed: 04/13/2016 11:44:51 AM By: Ruthine Dose Entered By: Ruthine Dose on 04/13/2016 11:44:51 Mitchell [name], Mitchell [name] ([id number]) -------------------------------------------------------------------------------- Multi Wound Chart Details Patient Name: Mitchell [name] Date of Service: 04/13/2016 10:45 AM Medical Record Number: [id number] Patient Account Number: [id number] Date of Birth/Sex: [birth year] (81 y.o. Male) Treating RN: Montey Hora Primary Care Physician: Tedra Senegal Other Clinician: Referring Physician: Tedra Senegal Treating Physician/Extender: Ricard Dillon Weeks in Treatment: 14 Vital Signs Height(in): 75 Pulse(bpm): 116 Weight(lbs): 272 Blood Pressure 153/99 (mmHg): Body Mass Index(BMI): 34 Temperature(F): 97.6 Respiratory Rate 18 (breaths/min): Photos: [N/A:N/A] Wound Location: Back - Midline N/A N/A Wounding Event: Surgical Injury N/A N/A Primary Etiology: Open Surgical Wound N/A N/A Comorbid History: Cataracts, Asthma, N/A N/A Arrhythmia, Hypertension, Osteoarthritis Date Acquired: 10/24/2015 N/A N/A Weeks of Treatment: 14 N/A N/A Wound Status: Open N/A N/A Measurements L x W x D 0.3x0.2x3.4 N/A N/A (cm) Area (cm) : 0.047 N/A N/A Volume (cm) : 0.16 N/A N/A % Reduction in Area: 86.40% N/A N/A % Reduction in Volume: 93.90% N/A N/A Classification: Full Thickness Without N/A N/A Exposed Support Structures Exudate Amount: Medium N/A N/A Exudate Type: Serous N/A N/A Exudate Color: amber N/A N/A Wound Margin: Indistinct, nonvisible N/A N/A Granulation Amount: Large  (67-100%) N/A N/A Sackrider, Jory W. ([id number]) Granulation Quality: Red N/A N/A Necrotic Amount: None Present (0%) N/A N/A Exposed Structures: Fascia: No N/A N/A Fat: No Tendon: No Muscle: No Joint: No Bone: No Limited to Skin Breakdown Epithelialization: None N/A N/A Periwound Skin Texture: Edema: No N/A N/A Excoriation: No Induration: No Callus: No Crepitus: No Fluctuance: No Friable: No  Rash: No Scarring: No Periwound Skin Moist: Yes N/A N/A Moisture: Maceration: No Dry/Scaly: No Periwound Skin Color: Atrophie Blanche: No N/A N/A Cyanosis: No Ecchymosis: No Erythema: No Hemosiderin Staining: No Mottled: No Pallor: No Rubor: No Tenderness on No N/A N/A Palpation: Wound Preparation: Ulcer Cleansing: N/A N/A Rinsed/Irrigated with Saline Topical Anesthetic Applied: None Treatment Notes Wound #1 (Midline Back) 1. Cleansed with: Clean wound with Normal Saline 4. Dressing Applied: Iodoform packing Gauze 5. Secondary Dressing Applied Bordered Foam Dressing Dry Gauze Mitchell [name], Mitchell [name] ([id number]) Electronic Signature(s) Signed: 04/13/2016 3:32:00 PM By: Linton Ham MD Entered By: Linton Ham on 04/13/2016 12:12:12 Mitchell [name], Mitchell [name] ([id number]) -------------------------------------------------------------------------------- [place] Details Patient Name: Mitchell [name] Date of Service: 04/13/2016 10:45 AM Medical Record Number: [id number] Patient Account Number: [id number] Date of Birth/Sex: August 14, 1934 (81 y.o. Male) Treating RN: Montey Hora Primary Care Physician: Tedra Senegal Other Clinician: Referring Physician: Tedra Senegal Treating Physician/Extender: Tito Dine in Treatment: 14 Active Inactive Abuse / Safety / Falls / Self Care Management Nursing Diagnoses: Impaired physical mobility Potential for falls Goals: Patient will remain injury free Date Initiated: 12/31/2015 Goal Status:  Active Interventions: Assess fall risk on admission and as needed Notes: Orientation to the Wound Care Program Nursing Diagnoses: Knowledge deficit related to the wound healing center program Goals: Patient/caregiver will verbalize understanding of the [facility] Date Initiated: 12/31/2015 Goal Status: Active Interventions: Provide education on orientation to the wound center Notes: Wound/Skin Impairment Nursing Diagnoses: Impaired tissue integrity Goals: Patient/caregiver will verbalize understanding of skin care regimen Mitchell [name], Mitchell [name] ([id number]) Date Initiated: 12/31/2015 Goal Status: Active Ulcer/skin breakdown will have a volume reduction of 30% by week 4 Date Initiated: 12/31/2015 Goal Status: Active Ulcer/skin breakdown will have a volume reduction of 50% by week 8 Date Initiated: 12/31/2015 Goal Status: Active Ulcer/skin breakdown will have a volume reduction of 80% by week 12 Date Initiated: 12/31/2015 Goal Status: Active Ulcer/skin breakdown will heal within 14 weeks Date Initiated: 12/31/2015 Goal Status: Active Interventions: Assess patient/caregiver ability to obtain necessary supplies Assess patient/caregiver ability to perform ulcer/skin care regimen upon admission and as needed Assess ulceration(s) every visit Notes: Electronic Signature(s) Signed: 04/13/2016 4:01:24 PM By: Montey Hora Entered By: Montey Hora on 04/13/2016 11:29:53 Mitchell [name] ([id number]) -------------------------------------------------------------------------------- Pain Assessment Details Patient Name: Mitchell [name] Date of Service: 04/13/2016 10:45 AM Medical Record Number: [id number] Patient Account Number: [id number] Date of Birth/Sex: [birth year] (81 y.o. Male) Treating RN: Montey Hora Primary Care Physician: Tedra Senegal Other Clinician: Referring Physician: Tedra Senegal Treating Physician/Extender: Tito Dine in Treatment:  14 Active Problems Location of Pain Severity and Description of Pain Patient Has Paino No Site Locations Pain Management and Medication Current Pain Management: Notes Topical or injectable lidocaine is offered to patient for acute pain when surgical debridement is performed. If needed, Patient is instructed to use over the counter pain medication for the following 24-48 hours after debridement. Wound care MDs do not prescribed pain medications. Patient has chronic pain or uncontrolled pain. Patient has been instructed to make an appointment with their Primary Care Physician for pain management. Electronic Signature(s) Signed: 04/13/2016 4:01:24 PM By: Montey Hora Entered By: Montey Hora on 04/13/2016 11:10:14 Mitchell [name] ([id number]) -------------------------------------------------------------------------------- Patient/Caregiver Education Details Patient Name: Mitchell [name] Date of Service: 04/13/2016 10:45 AM Medical Record Number: [id number] Patient Account Number: [id number] Date of Birth/Gender: 01-08-1935 (81 y.o. Male) Treating RN: Montey Hora Primary Care Physician: Tedra Senegal Other Clinician: Referring Physician: Renold Genta  MARY Treating Physician/Extender: Tito Dine in Treatment: 14 Education Assessment Education Provided To: Patient and Caregiver Education Topics Provided Wound/Skin Impairment: Handouts: Other: wound care as ordered Methods: Demonstration, Explain/Verbal Responses: State content correctly Electronic Signature(s) Signed: 04/13/2016 4:01:24 PM By: Montey Hora Entered By: Montey Hora on 04/13/2016 11:42:43 Mitchell [name], Mitchell [name] ([id number]) -------------------------------------------------------------------------------- Wound Assessment Details Patient Name: Mitchell [name] Date of Service: 04/13/2016 10:45 AM Medical Record Number: [id number] Patient Account Number: [id number] Date of Birth/Sex: 11/13/1934 (81  y.o. Male) Treating RN: Montey Hora Primary Care Physician: Tedra Senegal Other Clinician: Referring Physician: Tedra Senegal Treating Physician/Extender: Tito Dine in Treatment: 14 Wound Status Wound Number: 1 Primary Open Surgical Wound Etiology: Wound Location: Back - Midline Wound Open Wounding Event: Surgical Injury Status: Date Acquired: 10/24/2015 Comorbid Cataracts, Asthma, Arrhythmia, Weeks Of Treatment: 14 History: Hypertension, Osteoarthritis Clustered Wound: No Photos Wound Measurements Length: (cm) 0.3 Width: (cm) 0.2 Depth: (cm) 3.4 Area: (cm) 0.047 Volume: (cm) 0.16 % Reduction in Area: 86.4% % Reduction in Volume: 93.9% Epithelialization: None Tunneling: No Undermining: No Wound Description Full Thickness Without Exposed Classification: Support Structures Wound Margin: Indistinct, nonvisible Exudate Medium Amount: Exudate Type: Serous Exudate Color: amber Wound Bed Granulation Amount: Large (67-100%) Exposed Structure Granulation Quality: Red Fascia Exposed: No Necrotic Amount: None Present (0%) Fat Layer Exposed: No Mitchell [name], Melecio W. ([id number]) Tendon Exposed: No Muscle Exposed: No Joint Exposed: No Bone Exposed: No Limited to Skin Breakdown Periwound Skin Texture Texture Color No Abnormalities Noted: No No Abnormalities Noted: No Callus: No Atrophie Blanche: No Crepitus: No Cyanosis: No Excoriation: No Ecchymosis: No Fluctuance: No Erythema: No Friable: No Hemosiderin Staining: No Induration: No Mottled: No Localized Edema: No Pallor: No Rash: No Rubor: No Scarring: No Moisture No Abnormalities Noted: No Dry / Scaly: No Maceration: No Moist: Yes Wound Preparation Ulcer Cleansing: Rinsed/Irrigated with Saline Topical Anesthetic Applied: None Treatment Notes Wound #1 (Midline Back) 1. Cleansed with: Clean wound with Normal Saline 4. Dressing Applied: Iodoform packing Gauze 5. Secondary Dressing  Applied Bordered Foam Dressing Dry Gauze Electronic Signature(s) Signed: 04/13/2016 4:01:24 PM By: Montey Hora Entered By: Montey Hora on 04/13/2016 11:26:13 JASON, MARSHALL ([id number]) -------------------------------------------------------------------------------- Vitals Details Patient Name: Mitchell [name] Date of Service: 04/13/2016 10:45 AM Medical Record Number: [id number] Patient Account Number: [id number] Date of Birth/Sex: 1935/04/04 (81 y.o. Male) Treating RN: Montey Hora Primary Care Physician: Tedra Senegal Other Clinician: Referring Physician: Tedra Senegal Treating Physician/Extender: Tito Dine in Treatment: 14 Vital Signs Time Taken: 11:10 Temperature (F): 97.6 Height (in): 75 Pulse (bpm): 116 Weight (lbs): 272 Respiratory Rate (breaths/min): 18 Body Mass Index (BMI): 34 Blood Pressure (mmHg): 153/99 Reference Range: 80 - 120 mg / dl Electronic Signature(s) Signed: 04/13/2016 4:01:24 PM By: Montey Hora Entered By: Montey Hora on 04/13/2016 11:12:13

## 2016-04-13 NOTE — Progress Notes (Signed)
JOBIE, RAMANI ([id number]) Visit Report for 04/13/2016 Chief Complaint Document Details Patient Name: Mitchell [name], Mitchell [name] Date of Service: 04/13/2016 10:45 AM Medical Record Patient Account Number: [id number] Number: Treating RN: Montey Hora 03-22-1935 (81 y.o. Other Clinician: Date of Birth/Sex: Male) Treating ROBSON, MICHAEL Primary Care Physician: Tedra Senegal Physician/Extender: G Referring Physician: Assunta Gambles in Treatment: 14 Information Obtained from: Patient Chief Complaint 01/28/16 82 year old man admitted to clinic for review of his surgical wound in his lumbar spine area Electronic Signature(s) Signed: 04/13/2016 3:32:00 PM By: Linton Ham MD Entered By: Linton Ham on 04/13/2016 12:14:25 Mitchell [name] ([id number]) -------------------------------------------------------------------------------- HPI Details Patient Name: Mitchell [name] Date of Service: 04/13/2016 10:45 AM Medical Record Patient Account Number: [id number] Number: Treating RN: Montey Hora May 14, 1934 (81 y.o. Other Clinician: Date of Birth/Sex: Male) Treating ROBSON, MICHAEL Primary Care Physician: Tedra Senegal Physician/Extender: G Referring Physician: Tedra Senegal Weeks in Treatment: 14 History of Present Illness HPI Description: 12/31/15; this is a patient to was increasingly incapacitated over the last year with severe lumbar spinal stenosis and radiculopathy at L4-L5 L5-S1 with a large disc herniation at L2-L3. He had several spinal injections over the last year with no relief in his pain. On July 7 17 he had a redo decompression with fusion at L4-L5 L5-S1 and a laminectomy and microdiscectomy of L2-L3 with pedicle screw fixation L2-S1. Is not really clear to me at the time of this dictation as to the exact course of this wound. However he was discharged to rehabilitation at [facility] and it was very clear at the end of the stay here that there  was an open area for which she had a wound VAC for a period of time. A culture on 10/29/15 showed both Pseudomonas and Klebsiella. He was discharged on ciprofloxacin and he still remains on that currently. He has home health going into his home now and doing iodoform packing. He has a follow-up with his neurosurgeon Dr. Vertell Limber tomorrow. There is still moderate amount of drainage. However the patient denies fever or chills or excessive pain. He is working hard with physical therapy in order to gain ambulatory status. He has a right foot drop for which he uses an AFO brace. As far as I can tell he has not had any advanced imaging of the low back although he apparently a has had plain x-rays in the neurosurgeon's office. No recent cultures. 01/14/16; culture from 2 weeks ago was negative. He went to sees Dr. Vertell Limber last week who is still was reluctant to proceed with any more imaging studies, they follow with him on February 02, 2023. They're using Aquacel Ag packing but still having copious amounts of clear yellowish drainage. This is being changed once a day. He has not been systemically unwell, no pain no fever and really no complaints other than the periwound is itchy 01/28/16 at this point in time patient has had a follow-up visit with Dr. Melven Sartorius neurosurgeon who is still not recommending any further advanced imaging studies at this point in time. We have been using Aquacel Ag packing although Dr. Vertell Limber didn't want him not to pack this as tightly. This is being changed daily. He has no signs or symptoms of systemic infection and his main issue is that he has a rash and itching around the periwound region. They have been using over-the-counter antifungal cream nothing prescribed up to this point. Fortunately he is not having a significant amount of pain at this point in time and  rates his discomfort to be a 1-2 out of 10 02/04/16 no major change since I last saw this wound 3 weeks ago. Depth is 6.3 cm  although his wife reports that there is much less drainage he has completed antibiotics although I don't know that we prescribed them. He had some irritation to the periwound and was given a combination of nystatin and triamcinolone apparently he developed severe irritation from this 02/17/16 this point in time patient seems to be doing well in regard to his back wound. He has had a follow- up with his neurosurgeon Dr. Vertell Limber who is pleased with how this is progressing. Fortunately he has no overall worsening symptoms and no interval signs or symptoms of infection. He tells me that the pain is also not as severe at this point. This is good news. his wife continues to perform the dressing changes at this BOYD, MAGSINO. ([id number]) point in time. 03/02/16 surgical wound on the lower lumbar spine. Depth today is 5 cm down from 5.7 last week. This appears to be making gradual progress. There is no pain. Using iodoform packing gauze 03/16/16; surgical wound on the lower lumbar spine. Depth today is 4 cm, according to our intake there is down from 4.5 last time although I have 5 cm listed my notes. Nevertheless over time this appears to be gradually filling in. 03/30/16; 3.8 cm in depth today still using iodoform. 7.6 cm in depth on admission here. No drainage no complaints of pain 04/13/16; 3.8 cm of depth today which is unchanged from last week. However the area appears to probe more superiorly now rather than with any depth. There is no evidence of surrounding infection no drainage for at least less drainage Electronic Signature(s) Signed: 04/13/2016 3:32:00 PM By: Linton Ham MD Entered By: Linton Ham on 04/13/2016 12:15:40 Mitchell [name] ([id number]) -------------------------------------------------------------------------------- Physical Exam Details Patient Name: Mitchell [name] Date of Service: 04/13/2016 10:45 AM Medical Record Patient Account Number:  [id number] Number: Treating RN: Montey Hora January 11, 1935 (81 y.o. Other Clinician: Date of Birth/Sex: Male) Treating ROBSON, MICHAEL Primary Care Physician: Tedra Senegal Physician/Extender: G Referring Physician: Tedra Senegal Weeks in Treatment: 33 Constitutional Patient is hypertensive.. Pulse regular and within target range for patient.[name] Respirations regular, non-labored and within target range.. Temperature is normal and within the target range for the patient.. Patient's appearance is neat and clean. Appears in no acute distress. Well nourished and well developed.. Notes Wound exam; again 3.8 cm of depth although this probes more superiorly. No evidence of surrounding erythema. No drainage Electronic Signature(s) Signed: 04/13/2016 3:32:00 PM By: Linton Ham MD Entered By: Linton Ham on 04/13/2016 12:16:28 Mitchell [name] ([id number]) -------------------------------------------------------------------------------- Physician Orders Details Patient Name: Mitchell [name] Date of Service: 04/13/2016 10:45 AM Medical Record Patient Account Number: [id number] Number: Treating RN: Montey Hora 11/30/1934 (81 y.o. Other Clinician: Date of Birth/Sex: Male) Treating ROBSON, MICHAEL Primary Care Physician: Tedra Senegal Physician/Extender: G Referring Physician: Assunta Gambles in Treatment: 73 Verbal / Phone Orders: Yes Clinician: Montey Hora Read Back and Verified: Yes Diagnosis Coding Wound Cleansing Wound #1 Midline Back o Clean wound with Normal Saline. o May Shower, gently pat wound dry prior to applying new dressing. Primary Wound Dressing Wound #1 Midline Back o Iodoform packing Gauze Secondary Dressing Wound #1 Midline Back o Boardered Foam Dressing - Allevyn Life or Equal to protect surrounding skin from tape damage. o Drawtex Dressing Change Frequency Wound #1 Midline Back o Change dressing every day. Follow-up  Appointments Wound #1 Midline Back o Return Appointment in 2 weeks. Electronic Signature(s) Signed: 04/13/2016 3:32:00 PM By: Linton Ham MD Signed: 04/13/2016 4:01:24 PM By: Montey Hora Entered By: Montey Hora on 04/13/2016 11:30:29 TYDRICK, VALTIERRA ([id number]) -------------------------------------------------------------------------------- Problem List Details Patient Name: Mitchell [name] Date of Service: 04/13/2016 10:45 AM Medical Record Patient Account Number: [id number] Number: Treating RN: Montey Hora December 15, 1934 (81 y.o. Other Clinician: Date of Birth/Sex: Male) Treating ROBSON, MICHAEL Primary Care Physician: Tedra Senegal Physician/Extender: G Referring Physician: Assunta Gambles in Treatment: 14 Active Problems ICD-10 Encounter Code Description Active Date Diagnosis T81.31XD Disruption of external operation (surgical) wound, not 12/31/2015 Yes elsewhere classified, subsequent encounter L98.429 Non-pressure chronic ulcer of back with unspecified 12/31/2015 Yes severity M48.06 Spinal stenosis, lumbar region 12/31/2015 Yes Inactive Problems Resolved Problems Electronic Signature(s) Signed: 04/13/2016 3:32:00 PM By: Linton Ham MD Entered By: Linton Ham on 04/13/2016 12:12:02 Mitchell [name] ([id number]) -------------------------------------------------------------------------------- Progress Note Details Patient Name: Mitchell [name] Date of Service: 04/13/2016 10:45 AM Medical Record Patient Account Number: [id number] Number: Treating RN: Montey Hora [birth year] (81 y.o. Other Clinician: Date of Birth/Sex: Male) Treating ROBSON, MICHAEL Primary Care Physician: Tedra Senegal Physician/Extender: G Referring Physician: Assunta Gambles in Treatment: 14 Subjective Chief Complaint Information obtained from Patient 01/28/16 81 year old man admitted to clinic for review of his surgical wound in his lumbar spine  area History of Present Illness (HPI) 12/31/15; this is a patient to was increasingly incapacitated over the last year with severe lumbar spinal stenosis and radiculopathy at L4-L5 L5-S1 with a large disc herniation at L2-L3. He had several spinal injections over the last year with no relief in his pain. On July 7 17 he had a redo decompression with fusion at L4-L5 L5-S1 and a laminectomy and microdiscectomy of L2-L3 with pedicle screw fixation L2-S1. Is not really clear to me at the time of this dictation as to the exact course of this wound. However he was discharged to rehabilitation at [facility] and it was very clear at the end of the stay here that there was an open area for which she had a wound VAC for a period of time. A culture on 10/29/15 showed both Pseudomonas and Klebsiella. He was discharged on ciprofloxacin and he still remains on that currently. He has home health going into his home now and doing iodoform packing. He has a follow-up with his neurosurgeon Dr. Vertell Limber tomorrow. There is still moderate amount of drainage. However the patient denies fever or chills or excessive pain. He is working hard with physical therapy in order to gain ambulatory status. He has a right foot drop for which he uses an AFO brace. As far as I can tell he has not had any advanced imaging of the low back although he apparently a has had plain x-rays in the neurosurgeon's office. No recent cultures. 01/14/16; culture from 2 weeks ago was negative. He went to sees Dr. Vertell Limber last week who is still was reluctant to proceed with any more imaging studies, they follow with him on 02/09/2023. They're using Aquacel Ag packing but still having copious amounts of clear yellowish drainage. This is being changed once a day. He has not been systemically unwell, no pain no fever and really no complaints other than the periwound is itchy 01/28/16 at this point in time patient has had a follow-up visit with Dr. Melven Sartorius  neurosurgeon who is still not recommending any further advanced imaging studies at this point in time.  We have been using Aquacel Ag packing although Dr. Vertell Limber didn't want him not to pack this as tightly. This is being changed daily. He has no signs or symptoms of systemic infection and his main issue is that he has a rash and itching around the periwound region. They have been using over-the-counter antifungal cream nothing prescribed up to this point. Fortunately he is not having a significant amount of pain at this point in time and rates his discomfort to be a 1-2 out of 10 02/04/16 no major change since I last saw this wound 3 weeks ago. Depth is 6.3 cm although his wife reports that there is much less drainage he has completed antibiotics although I don't know that we prescribed them. He had some irritation to the periwound and was given a combination of nystatin and Harwick, Dryden W. ([id number]) triamcinolone apparently he developed severe irritation from this 02/17/16 this point in time patient seems to be doing well in regard to his back wound. He has had a follow- up with his neurosurgeon Dr. Vertell Limber who is pleased with how this is progressing. Fortunately he has no overall worsening symptoms and no interval signs or symptoms of infection. He tells me that the pain is also not as severe at this point. This is good news. his wife continues to perform the dressing changes at this point in time. 03/02/16 surgical wound on the lower lumbar spine. Depth today is 5 cm down from 5.7 last week. This appears to be making gradual progress. There is no pain. Using iodoform packing gauze 03/16/16; surgical wound on the lower lumbar spine. Depth today is 4 cm, according to our intake there is down from 4.5 last time although I have 5 cm listed my notes. Nevertheless over time this appears to be gradually filling in. 03/30/16; 3.8 cm in depth today still using iodoform. 7.6 cm in depth on admission  here. No drainage no complaints of pain 04/13/16; 3.8 cm of depth today which is unchanged from last week. However the area appears to probe more superiorly now rather than with any depth. There is no evidence of surrounding infection no drainage for at least less drainage Objective Constitutional Patient is hypertensive.. Pulse regular and within target range for patient.[name] Respirations regular, non-labored and within target range.. Temperature is normal and within the target range for the patient.. Patient's appearance is neat and clean. Appears in no acute distress. Well nourished and well developed.. Vitals Time Taken: 11:10 AM, Height: 75 in, Weight: 272 lbs, BMI: 34, Temperature: 97.6 F, Pulse: 116 bpm, Respiratory Rate: 18 breaths/min, Blood Pressure: 153/99 mmHg. General Notes: Wound exam; again 3.8 cm of depth although this probes more superiorly. No evidence of surrounding erythema. No drainage Integumentary (Hair, Skin) Wound #1 status is Open. Original cause of wound was Surgical Injury. The wound is located on the Midline Back. The wound measures 0.3cm length x 0.2cm width x 3.4cm depth; 0.047cm^2 area and 0.16cm^3 volume. The wound is limited to skin breakdown. There is no tunneling or undermining noted. There is a medium amount of serous drainage noted. The wound margin is indistinct and nonvisible. There is large (67-100%) red granulation within the wound bed. There is no necrotic tissue within the wound bed. The periwound skin appearance exhibited: Moist. The periwound skin appearance did not exhibit: Callus, Crepitus, Excoriation, Fluctuance, Friable, Induration, Localized Edema, Rash, Scarring, Dry/Scaly, Maceration, Atrophie Blanche, Cyanosis, Ecchymosis, Hemosiderin Staining, Mottled, Pallor, Rubor, Erythema. JERMON, HUMPERT ([id number]) Assessment Active Problems ICD-10  T81.31XD - Disruption of external operation (surgical) wound, not elsewhere classified,  subsequent encounter L98.429 - Non-pressure chronic ulcer of back with unspecified severity M48.06 - Spinal stenosis, lumbar region Plan Wound Cleansing: Wound #1 Midline Back: Clean wound with Normal Saline. May Shower, gently pat wound dry prior to applying new dressing. Primary Wound Dressing: Wound #1 Midline Back: Iodoform packing Gauze Secondary Dressing: Wound #1 Midline Back: Boardered Foam Dressing - Allevyn Life or Equal to protect surrounding skin from tape damage. Drawtex Dressing Change Frequency: Wound #1 Midline Back: Change dressing every day. Follow-up Appointments: Wound #1 Midline Back: Return Appointment in 2 weeks. #1 I'm going to continue with the iodoform packing however if the depth of this doesn't change by next week consider changing to either silver alginate or PolyMem packing. The orifice of this however is Water engineer) Signed: 04/13/2016 3:32:00 PM By: Linton Ham MD JHADIEL, EGGETT ([id number]) Entered By: Linton Ham on 04/13/2016 12:17:53 F…LIX, RIVERS ([id number]) -------------------------------------------------------------------------------- SuperBill Details Patient Name: Mitchell [name] Date of Service: 04/13/2016 Medical Record Patient Account Number: [id number] Number: Treating RN: Montey Hora Nov 12, 1934 (81 y.o. Other Clinician: Date of Birth/Sex: Male) Treating ROBSON, Clio Primary Care Physician: Tedra Senegal Physician/Extender: G Referring Physician: Tedra Senegal Weeks in Treatment: 14 Diagnosis Coding ICD-10 Codes Code Description Disruption of external operation (surgical) wound, not elsewhere classified, subsequent T81.31XD encounter L98.429 Non-pressure chronic ulcer of back with unspecified severity M48.06 Spinal stenosis, lumbar region Facility Procedures CPT4 Code: [id number] Description: [phone] - WOUND CARE VISIT-LEV 2 EST PT Modifier: Quantity: 1 Physician  Procedures CPT4: Description Modifier Quantity Code [id number] - WC PHYS LEVEL 2 - EST PT 1 ICD-10 Description Diagnosis T81.31XD Disruption of external operation (surgical) wound, not elsewhere classified, subsequent encounter L98.429 Non-pressure chronic ulcer  of back with unspecified severity Electronic Signature(s) Signed: 04/13/2016 3:32:00 PM By: Linton Ham MD Entered By: Linton Ham on 04/13/2016 12:18:13

## 2016-04-14 DIAGNOSIS — M545 Low back pain: Secondary | ICD-10-CM | POA: Diagnosis not present

## 2016-04-16 DIAGNOSIS — M545 Low back pain: Secondary | ICD-10-CM | POA: Diagnosis not present

## 2016-04-16 DIAGNOSIS — M5116 Intervertebral disc disorders with radiculopathy, lumbar region: Secondary | ICD-10-CM | POA: Diagnosis not present

## 2016-04-17 ENCOUNTER — Encounter: Payer: Self-pay | Admitting: Internal Medicine

## 2016-04-17 NOTE — Progress Notes (Signed)
   Subjective:    Patient ID: Mitchell [name], male    DOB: 1934-12-04, 81 y.o.   MRN: [id number]  HPI He is here today to follow-up at the suggestion of Dr. Melvyn Novas regarding beta blocker treatment. Dr. Melvyn Novas saw him December 7. He initially saw patient November 10. He was started on Zebeta and taken off of Tenormin 25 mg daily. He remains on Norvasc 5 mg daily. Blood pressures under good control on this regimen. He was referred there because he was still coughing and wheezing on November 7. He was still having persistent shortness of breath. Chest x-ray showed coarsened interstitial markings most prominent at lung bases without evidence of pneumonia. Chest x-ray in October showed streaky left base atelectasis or early infiltrate.  Dr. Melvyn Novas explained use of inhalers to him. Also Dr. Melvyn Novas indicated to him which inhaler was a maintenance inhaler versus  a rescue inhaler. Dr. Melvyn Novas felt that Symbicort 80 twice a day would be a better dose than 160. He also placed him on maximum GE reflux treatment. He indicated that Symbicort at the higher dose can irritate the upper airway. Patient may need to add a spacer. Allergy profile was ordered due to increased eosinophil count. He felt patient probably had upper airway cough syndrome due to absence of cough during sleep. Patient also had features of asthma.  History of LV dysfunction. History of atrial flutter with RVR status post TEE February 2013    Review of Systems no new complaints. Feels a bit stronger.     Objective:   Physical Exam Neck is supple without JVD. Chest clear to auscultation. Cardiac exam regular rate and rhythm normal S1 and S2. No significant pitting edema his blood pressure on arrival was 130/88 but when I rechecked it it was 130/80. Pulse was 116 on arrival. Have advised wife to continue to monitor his blood pressure at home and call if persistently elevated. He does weigh 275 pounds.       Assessment & Plan:  Dr. Melvyn Novas feels patient  has most likely upper airway cough syndrome but could have an asthma variant. He is changed him from Tenormin to [place]. Continue to monitor blood pressure at home and call if persistently elevated.  He also recently saw Dr. Chalmers Cater, endocrinologist. 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 Prolactin December 12 was 29.7 normal being up to 15.2. Prolactin in December 2016 was 26.5 Dr. Michiel Sites noted the patient was asymptomatic with mild elevation of prolactin. Feels that patient can be returned to my care and she can see him on a when necessary basis. We will need to get a prolactin level at least yearly.  His health maintenance exam is due in February but we can defer that to March if that is better for him.

## 2016-04-17 NOTE — Patient Instructions (Signed)
Continue to monitor blood pressure at home. We will get prolactin level once yearly at the time of your physical exam to monitor that since you are no longer seeing Dr. York Spaniel. Call if blood pressure persistently elevated.

## 2016-04-21 DIAGNOSIS — M5116 Intervertebral disc disorders with radiculopathy, lumbar region: Secondary | ICD-10-CM | POA: Diagnosis not present

## 2016-04-21 DIAGNOSIS — M545 Low back pain: Secondary | ICD-10-CM | POA: Diagnosis not present

## 2016-04-23 DIAGNOSIS — M5116 Intervertebral disc disorders with radiculopathy, lumbar region: Secondary | ICD-10-CM | POA: Diagnosis not present

## 2016-04-23 DIAGNOSIS — M545 Low back pain: Secondary | ICD-10-CM | POA: Diagnosis not present

## 2016-04-26 DIAGNOSIS — M545 Low back pain: Secondary | ICD-10-CM | POA: Diagnosis not present

## 2016-04-26 DIAGNOSIS — M5116 Intervertebral disc disorders with radiculopathy, lumbar region: Secondary | ICD-10-CM | POA: Diagnosis not present

## 2016-04-27 ENCOUNTER — Encounter: Payer: Medicare Other | Attending: Internal Medicine | Admitting: Internal Medicine

## 2016-04-27 DIAGNOSIS — Y839 Surgical procedure, unspecified as the cause of abnormal reaction of the patient, or of later complication, without mention of misadventure at the time of the procedure: Secondary | ICD-10-CM | POA: Insufficient documentation

## 2016-04-27 DIAGNOSIS — L98429 Non-pressure chronic ulcer of back with unspecified severity: Secondary | ICD-10-CM | POA: Insufficient documentation

## 2016-04-27 DIAGNOSIS — T8131XD Disruption of external operation (surgical) wound, not elsewhere classified, subsequent encounter: Secondary | ICD-10-CM | POA: Insufficient documentation

## 2016-04-27 DIAGNOSIS — M48061 Spinal stenosis, lumbar region without neurogenic claudication: Secondary | ICD-10-CM | POA: Insufficient documentation

## 2016-04-27 DIAGNOSIS — T8189XA Other complications of procedures, not elsewhere classified, initial encounter: Secondary | ICD-10-CM | POA: Diagnosis not present

## 2016-04-28 DIAGNOSIS — M545 Low back pain: Secondary | ICD-10-CM | POA: Diagnosis not present

## 2016-04-28 DIAGNOSIS — M5116 Intervertebral disc disorders with radiculopathy, lumbar region: Secondary | ICD-10-CM | POA: Diagnosis not present

## 2016-04-28 NOTE — Progress Notes (Signed)
ASA, HARSHA ([id number]) Visit Report for 04/27/2016 Arrival Information Details Patient Name: Mitchell [name] Date of Service: 04/27/2016 12:30 PM Medical Record Number: [id number] Patient Account Number: [id number] Date of Birth/Sex: 12-16-1934 (81 y.o. Male) Treating RN: Mitchell [name] Primary Care Physician: Mitchell [name] Other Clinician: Referring Physician: Tedra [name] Treating Physician/Extender: Mitchell [name] in Treatment: 16 Visit Information History Since Last Visit Added or deleted any medications: No Patient Arrived: Walker Any new allergies or adverse reactions: No Arrival Time: 12:40 Had a fall or experienced change in No Accompanied By: wife activities of daily living that may affect Transfer Assistance: None risk of falls: Patient Identification Verified: Yes Signs or symptoms of abuse/neglect since last No Secondary Verification Process Completed: Yes visito Patient Requires Transmission-Based No Hospitalized since last visit: No Precautions: Has Dressing in Place as Prescribed: Yes Patient Has Alerts: No Pain Present Now: No Electronic Signature(s) Signed: 04/28/2016 2:01:17 PM By: Mitchell Cool, RN, [name], Mitchell [name] Entered By: Mitchell Cool, RN, [name], Mitchell on 04/27/2016 12:46:32 Mitchell [name] ([id number]) -------------------------------------------------------------------------------- Clinic Level of Care Assessment Details Patient Name: Mitchell [name] Date of Service: 04/27/2016 12:30 PM Medical Record Number: [id number] Patient Account Number: [id number] Date of Birth/Sex: [birth year] (81 y.o. Male) Treating RN: Mitchell [name] Primary Care Physician: Mitchell [name] Other Clinician: Referring Physician: Tedra [name] Treating Physician/Extender: Mitchell [name] in Treatment: 16 Clinic Level of Care Assessment Items TOOL 4 Quantity Score []  - Use when only an EandM is performed on FOLLOW-UP visit 0 ASSESSMENTS - Nursing Assessment / Reassessment []  -  Reassessment of Co-morbidities (includes updates in patient status) 0 X - Reassessment of Adherence to Treatment Plan 1 5 ASSESSMENTS - Wound and Skin Assessment / Reassessment X - Simple Wound Assessment / Reassessment - one wound 1 5 []  - Complex Wound Assessment / Reassessment - multiple wounds 0 []  - Dermatologic / Skin Assessment (not related to wound area) 0 ASSESSMENTS - Focused Assessment []  - Circumferential Edema Measurements - multi extremities 0 []  - Nutritional Assessment / Counseling / Intervention 0 []  - Lower Extremity Assessment (monofilament, tuning fork, pulses) 0 []  - Peripheral Arterial Disease Assessment (using hand held doppler) 0 ASSESSMENTS - Ostomy and/or Continence Assessment and Care []  - Incontinence Assessment and Management 0 []  - Ostomy Care Assessment and Management (repouching, etc.) 0 PROCESS - Coordination of Care X - Simple Patient / Family Education for ongoing care 1 15 []  - Complex (extensive) Patient / Family Education for ongoing care 0 []  - Staff obtains Programmer, systems, Records, Test Results / Process Orders 0 []  - Staff telephones HHA, Nursing Homes / Clarify orders / etc 0 []  - Routine Transfer to another Facility (non-emergent condition) 0 [name], [name] ([id number]) []  - Routine Hospital Admission (non-emergent condition) 0 []  - New Admissions / Biomedical engineer / Ordering NPWT, Apligraf, etc. 0 []  - Emergency Hospital Admission (emergent condition) 0 X - Simple Discharge Coordination 1 10 []  - Complex (extensive) Discharge Coordination 0 PROCESS - Special Needs []  - Pediatric / Minor Patient Management 0 []  - Isolation Patient Management 0 []  - Hearing / Language / Visual special needs 0 []  - Assessment of Community assistance (transportation, D/C planning, etc.) 0 []  - Additional assistance / Altered mentation 0 []  - Support Surface(s) Assessment (bed, cushion, seat, etc.) 0 INTERVENTIONS - Wound Cleansing / Measurement X - Simple  Wound Cleansing - one wound 1 5 []  - Complex Wound Cleansing - multiple wounds 0 X - Wound Imaging (photographs - any number  of wounds) 1 5 []  - Wound Tracing (instead of photographs) 0 X - Simple Wound Measurement - one wound 1 5 []  - Complex Wound Measurement - multiple wounds 0 INTERVENTIONS - Wound Dressings []  - Small Wound Dressing one or multiple wounds 0 X - Medium Wound Dressing one or multiple wounds 1 15 []  - Large Wound Dressing one or multiple wounds 0 []  - Application of Medications - topical 0 []  - Application of Medications - injection 0 INTERVENTIONS - Miscellaneous []  - External ear exam 0 [name], [name] ([id number]) []  - Specimen Collection (cultures, biopsies, blood, body fluids, etc.) 0 []  - Specimen(s) / Culture(s) sent or taken to Lab for analysis 0 []  - Patient Transfer (multiple staff / Harrel Lemon Lift / Similar devices) 0 []  - Simple Staple / Suture removal (25 or less) 0 []  - Complex Staple / Suture removal (26 or more) 0 []  - Hypo / Hyperglycemic Management (close monitor of Blood Glucose) 0 []  - Ankle / Brachial Index (ABI) - do not check if billed separately 0 X - Vital Signs 1 5 Has the patient been seen at the hospital within the last three years: Yes Total Score: 70 Level Of Care: New/Established - Level 2 Electronic Signature(s) Signed: 04/28/2016 2:01:17 PM By: Mitchell Cool, RN, [name], Mitchell [name] Entered By: Mitchell Cool, RN, [name], Mitchell on 04/27/2016 13:20:30 Mitchell [name] ([id number]) -------------------------------------------------------------------------------- Encounter Discharge Information Details Patient Name: Mitchell [name] Date of Service: 04/27/2016 12:30 PM Medical Record Number: [id number] Patient Account Number: [id number] Date of Birth/Sex: 1934-11-26 (82 y.o. Male) Treating RN: Mitchell [name] Primary Care Physician: Mitchell [name] Other Clinician: Referring Physician: Tedra [name] Treating Physician/Extender: Mitchell [name] in Treatment:  16 Encounter Discharge Information Items Discharge Pain Level: 0 Discharge Condition: Stable Ambulatory Status: Walker Discharge Destination: Home Transportation: Private Auto Accompanied By: wife Schedule Follow-up Appointment: Yes Medication Reconciliation completed Yes and provided to Patient/Care Adelis Docter: Provided on Clinical Summary of Care: 04/27/2016 Form Type Recipient Paper Patient HD Electronic Signature(s) Signed: 04/28/2016 2:01:17 PM By: Mitchell Cool, RN, [name], Mitchell [name] Previous Signature: 04/27/2016 1:14:26 PM Version By: Ruthine Dose Entered By: Mitchell Cool RN, [name], Mitchell on 04/27/2016 13:38:42 Mitchell [name] ([id number]) -------------------------------------------------------------------------------- Lower Extremity Assessment Details Patient Name: Mitchell [name] Date of Service: 04/27/2016 12:30 PM Medical Record Number: [id number] Patient Account Number: [id number] Date of Birth/Sex: 26-Mar-1935 (82 y.o. Male) Treating RN: Mitchell [name] Primary Care Physician: Mitchell [name] Other Clinician: Referring Physician: Tedra [name] Treating Physician/Extender: Mitchell [name] in Treatment: 16 Electronic Signature(s) Signed: 04/28/2016 2:01:17 PM By: Mitchell Cool, RN, [name], Mitchell [name] Entered By: Mitchell Cool, RN, [name], Mitchell on 04/27/2016 13:16:13 Mitchell [name] ([id number]) -------------------------------------------------------------------------------- Multi Wound Chart Details Patient Name: Mitchell [name] Date of Service: 04/27/2016 12:30 PM Medical Record Number: [id number] Patient Account Number: [id number] Date of Birth/Sex: 1934/11/10 (82 y.o. Male) Treating RN: Mitchell [name] Primary Care Physician: Mitchell [name] Other Clinician: Referring Physician: Tedra [name] Treating Physician/Extender: Ricard Dillon Weeks in Treatment: 16 Vital Signs Height(in): 75 Pulse(bpm): 88 Weight(lbs): 272 Blood Pressure 162/88 (mmHg): Body Mass Index(BMI): 34 Temperature(F): Respiratory  Rate 18 (breaths/min): Photos: [1:No Photos] [N/A:N/A] Wound Location: [1:Back - Midline] [N/A:N/A] Wounding Event: [1:Surgical Injury] [N/A:N/A] Primary Etiology: [1:Open Surgical Wound] [N/A:N/A] Comorbid History: [1:Cataracts, Asthma, Arrhythmia, Hypertension, Osteoarthritis] [N/A:N/A] Date Acquired: [1:10/24/2015] [N/A:N/A] Weeks of Treatment: [1:16] [N/A:N/A] Wound Status: [1:Open] [N/A:N/A] Measurements L x W x D 0.3x0.4x1.4 [N/A:N/A] (cm) Area (cm) : [1:0.094] [N/A:N/A] Volume (cm) : [1:0.132] [N/A:N/A] % Reduction in Area: [  1:72.80%] [N/A:N/A] % Reduction in Volume: 95.00% [N/A:N/A] Classification: [1:Full Thickness Without Exposed Support Structures] [N/A:N/A] Exudate Amount: [1:Medium] [N/A:N/A] Exudate Type: [1:Serous] [N/A:N/A] Exudate Color: [1:amber] [N/A:N/A] Wound Margin: [1:Indistinct, nonvisible] [N/A:N/A] Granulation Amount: [1:Large (67-100%)] [N/A:N/A] Granulation Quality: [1:Red] [N/A:N/A] Necrotic Amount: [1:None Present (0%)] [N/A:N/A] Exposed Structures: [1:Fascia: No Fat: No Tendon: No Muscle: No] [N/A:N/A] Joint: No Bone: No Epithelialization: None N/A N/A Periwound Skin Texture: Edema: No N/A N/A Excoriation: No Induration: No Callus: No Crepitus: No Fluctuance: No Friable: No Rash: No Scarring: No Periwound Skin Moist: Yes N/A N/A Moisture: Maceration: No Dry/Scaly: No Periwound Skin Color: Atrophie Blanche: No N/A N/A Cyanosis: No Ecchymosis: No Erythema: No Hemosiderin Staining: No Mottled: No Pallor: No Rubor: No Tenderness on No N/A N/A Palpation: Wound Preparation: Ulcer Cleansing: N/A N/A Rinsed/Irrigated with Saline Topical Anesthetic Applied: None Assessment Notes: Unable to visualize wound N/A N/A bed or structures due to size of wound. Treatment Notes Electronic Signature(s) Signed: 04/27/2016 4:24:12 PM By: Linton Ham MD Entered By: Linton Ham on 04/27/2016 13:30:46 BAILOR, DARGIN  ([id number]) -------------------------------------------------------------------------------- [place] Details Patient Name: Mitchell [name] Date of Service: 04/27/2016 12:30 PM Medical Record Number: [id number] Patient Account Number: [id number] Date of Birth/Sex: 09-Sep-1934 (82 y.o. Male) Treating RN: Mitchell [name] Primary Care Physician: Mitchell [name] Other Clinician: Referring Physician: Tedra [name] Treating Physician/Extender: Mitchell [name] in Treatment: 16 Active Inactive Abuse / Safety / Falls / Self Care Management Nursing Diagnoses: Impaired physical mobility Potential for falls Goals: Patient will remain injury free Date Initiated: 12/31/2015 Goal Status: Active Interventions: Assess fall risk on admission and as needed Notes: Orientation to the Wound Care Program Nursing Diagnoses: Knowledge deficit related to the wound healing center program Goals: Patient/caregiver will verbalize understanding of the [facility] Program Date Initiated: 12/31/2015 Goal Status: Active Interventions: Provide education on orientation to the wound center Notes: Wound/Skin Impairment Nursing Diagnoses: Impaired tissue integrity Goals: Patient/caregiver will verbalize understanding of skin care regimen FAVOUR, COTA ([id number]) Date Initiated: 12/31/2015 Goal Status: Active Ulcer/skin breakdown will have a volume reduction of 30% by week 4 Date Initiated: 12/31/2015 Goal Status: Active Ulcer/skin breakdown will have a volume reduction of 50% by week 8 Date Initiated: 12/31/2015 Goal Status: Active Ulcer/skin breakdown will have a volume reduction of 80% by week 12 Date Initiated: 12/31/2015 Goal Status: Active Ulcer/skin breakdown will heal within 14 weeks Date Initiated: 12/31/2015 Goal Status: Active Interventions: Assess patient/caregiver ability to obtain necessary supplies Assess patient/caregiver ability to perform ulcer/skin care  regimen upon admission and as needed Assess ulceration(s) every visit Notes: Electronic Signature(s) Signed: 04/28/2016 2:01:17 PM By: Mitchell Cool, RN, [name], Mitchell [name] Entered By: Mitchell Cool, RN, [name], Mitchell on 04/27/2016 13:17:20 Mitchell [name] ([id number]) -------------------------------------------------------------------------------- Pain Assessment Details Patient Name: Mitchell [name] Date of Service: 04/27/2016 12:30 PM Medical Record Number: [id number] Patient Account Number: [id number] Date of Birth/Sex: December 29, 1934 (82 y.o. Male) Treating RN: Mitchell [name] Primary Care Physician: Mitchell [name] Other Clinician: Referring Physician: Tedra [name] Treating Physician/Extender: Mitchell [name] in Treatment: 16 Active Problems Location of Pain Severity and Description of Pain Patient Has Paino No Site Locations With Dressing Change: No Pain Management and Medication Current Pain Management: Notes Topical or injectable lidocaine is offered to patient for acute pain when surgical debridement is performed. If needed, Patient is instructed to use over the counter pain medication for the following 24-48 hours after debridement. Wound care MDs do not prescribed pain medications. Patient has chronic pain or uncontrolled  pain. Patient has been instructed to make an appointment with their Primary Care Physician for pain management. Electronic Signature(s) Signed: 04/28/2016 2:01:17 PM By: Mitchell Cool, RN, [name], Mitchell [name] Entered By: Mitchell Cool, RN, [name], Mitchell on 04/27/2016 12:46:49 Mitchell [name] ([id number]) -------------------------------------------------------------------------------- Patient/Caregiver Education Details Patient Name: Mitchell [name] Date of Service: 04/27/2016 12:30 PM Medical Record Number: [id number] Patient Account Number: [id number] Date of Birth/Gender: May 14, 1934 (81 y.o. Male) Treating RN: Mitchell [name] Primary Care Physician: Mitchell [name] Other Clinician: Referring Physician:  Tedra [name] Treating Physician/Extender: Mitchell [name] in Treatment: 16 Education Assessment Education Provided To: Patient Education Topics Provided Wound/Skin Impairment: Handouts: Caring for Your Ulcer, Other: wound care as prescribed Methods: Explain/Verbal Responses: State content correctly Electronic Signature(s) Signed: 04/28/2016 2:01:17 PM By: Mitchell Cool, RN, [name], Mitchell [name] Entered By: Mitchell Cool, RN, [name], Mitchell on 04/27/2016 13:39:57 Mitchell [name] ([id number]) -------------------------------------------------------------------------------- Wound Assessment Details Patient Name: Mitchell [name] Date of Service: 04/27/2016 12:30 PM Medical Record Number: [id number] Patient Account Number: [id number] Date of Birth/Sex: 06-14-1934 (82 y.o. Male) Treating RN: Mitchell [name] Primary Care Physician: Mitchell [name] Other Clinician: Referring Physician: Tedra [name] Treating Physician/Extender: Mitchell [name] in Treatment: 16 Wound Status Wound Number: 1 Primary Open Surgical Wound Etiology: Wound Location: Back - Midline Wound Open Wounding Event: Surgical Injury Status: Date Acquired: 10/24/2015 Comorbid Cataracts, Asthma, Arrhythmia, Weeks Of Treatment: 16 History: Hypertension, Osteoarthritis Clustered Wound: No Photos Photo Uploaded By: Mitchell Cool, RN, [name], Mitchell on 04/27/2016 13:31:14 Wound Measurements Length: (cm) 0.3 Width: (cm) 0.4 Depth: (cm) 1.4 Area: (cm) 0.094 Volume: (cm) 0.132 % Reduction in Area: 72.8% % Reduction in Volume: 95% Epithelialization: None Wound Description Full Thickness Without Exposed Classification: Support Structures Wound Margin: Indistinct, nonvisible Exudate Medium Amount: Exudate Type: Serous Exudate Color: amber Wound Bed Granulation Amount: Large (67-100%) Exposed Structure Granulation Quality: Red Fascia Exposed: No Necrotic Amount: None Present (0%) Fat Layer Exposed: No Tendon Exposed: No Muscle Exposed:  No Zajkowski, Younis W. ([id number]) Joint Exposed: No Bone Exposed: No Periwound Skin Texture Texture Color No Abnormalities Noted: No No Abnormalities Noted: No Callus: No Atrophie Blanche: No Crepitus: No Cyanosis: No Excoriation: No Ecchymosis: No Fluctuance: No Erythema: No Friable: No Hemosiderin Staining: No Induration: No Mottled: No Localized Edema: No Pallor: No Rash: No Rubor: No Scarring: No Moisture No Abnormalities Noted: No Dry / Scaly: No Maceration: No Moist: Yes Wound Preparation Ulcer Cleansing: Rinsed/Irrigated with Saline Topical Anesthetic Applied: None Assessment Notes Unable to visualize wound bed or structures due to size of wound. Treatment Notes Wound #1 (Midline Back) 1. Cleansed with: Clean wound with Normal Saline 4. Dressing Applied: Iodoform packing Gauze 5. Secondary Dressing Applied Bordered Foam Dressing Electronic Signature(s) Signed: 04/28/2016 2:01:17 PM By: Mitchell Cool, RN, [name], Mitchell [name] Entered By: Mitchell Cool, RN, [name], Mitchell on 04/27/2016 12:53:25 BIGE, BARRIENTOS ([id number]) -------------------------------------------------------------------------------- Vitals Details Patient Name: Mitchell [name] Date of Service: 04/27/2016 12:30 PM Medical Record Number: [id number] Patient Account Number: [id number] Date of Birth/Sex: Sep 08, 1934 (81 y.o. Male) Treating RN: Mitchell [name] Primary Care Physician: Mitchell [name] Other Clinician: Referring Physician: Tedra [name] Treating Physician/Extender: Mitchell [name] in Treatment: 16 Vital Signs Time Taken: 12:47 Pulse (bpm): 88 Height (in): 75 Respiratory Rate (breaths/min): 18 Weight (lbs): 272 Blood Pressure (mmHg): 162/88 Body Mass Index (BMI): 34 Reference Range: 80 - 120 mg / dl Electronic Signature(s) Signed: 04/28/2016 2:01:17 PM By: Mitchell Cool, RN, [name], Mitchell [name] Entered By: Mitchell Cool, RN, [name], Mitchell on 04/27/2016 12:48:05

## 2016-04-28 NOTE — Progress Notes (Signed)
DAGO, MOM ([id number]) Visit Report for 04/27/2016 Chief Complaint Document Details Patient Name: Mitchell [name], Mitchell [name] Date of Service: 04/27/2016 12:30 PM Medical Record Patient Account Number: [id number] Number: Treating RN: Montey Hora 08-30-1934 (81 y.o. Other Clinician: Date of Birth/Sex: Male) Treating Liannah Yarbough Primary Care Physician: Tedra Senegal Physician/Extender: G Referring Physician: Assunta Gambles in Treatment: 16 Information Obtained from: Patient Chief Complaint 01/28/16 82 year old man admitted to clinic for review of his surgical wound in his lumbar spine area Electronic Signature(s) Signed: 04/27/2016 4:24:12 PM By: Linton Ham MD Entered By: Linton Ham on 04/27/2016 13:30:56 Mitchell [name] ([id number]) -------------------------------------------------------------------------------- HPI Details Patient Name: Mitchell [name] Date of Service: 04/27/2016 12:30 PM Medical Record Patient Account Number: [id number] Number: Treating RN: Montey Hora Feb 13, 1935 (81 y.o. Other Clinician: Date of Birth/Sex: Male) Treating Trek Kimball Primary Care Physician: Tedra Senegal Physician/Extender: G Referring Physician: Tedra Senegal Weeks in Treatment: 16 History of Present Illness HPI Description: 12/31/15; this is a patient to was increasingly incapacitated over the last year with severe lumbar spinal stenosis and radiculopathy at L4-L5 L5-S1 with a large disc herniation at L2-L3. He had several spinal injections over the last year with no relief in his pain. On July 7 17 he had a redo decompression with fusion at L4-L5 L5-S1 and a laminectomy and microdiscectomy of L2-L3 with pedicle screw fixation L2-S1. Is not really clear to me at the time of this dictation as to the exact course of this wound. However he was discharged to rehabilitation at [facility] and it was very clear at the end of the stay here that there was an  open area for which she had a wound VAC for a period of time. A culture on 10/29/15 showed both Pseudomonas and Klebsiella. He was discharged on ciprofloxacin and he still remains on that currently. He has home health going into his home now and doing iodoform packing. He has a follow-up with his neurosurgeon Dr. Vertell Limber tomorrow. There is still moderate amount of drainage. However the patient denies fever or chills or excessive pain. He is working hard with physical therapy in order to gain ambulatory status. He has a right foot drop for which he uses an AFO brace. As far as I can tell he has not had any advanced imaging of the low back although he apparently a has had plain x-rays in the neurosurgeon's office. No recent cultures. 01/14/16; culture from 2 weeks ago was negative. He went to sees Dr. Vertell Limber last week who is still was reluctant to proceed with any more imaging studies, they follow with him on 02/08/2023. They're using Aquacel Ag packing but still having copious amounts of clear yellowish drainage. This is being changed once a day. He has not been systemically unwell, no pain no fever and really no complaints other than the periwound is itchy 01/28/16 at this point in time patient has had a follow-up visit with Dr. Melven Sartorius neurosurgeon who is still not recommending any further advanced imaging studies at this point in time. We have been using Aquacel Ag packing although Dr. Vertell Limber didn't want him not to pack this as tightly. This is being changed daily. He has no signs or symptoms of systemic infection and his main issue is that he has a rash and itching around the periwound region. They have been using over-the-counter antifungal cream nothing prescribed up to this point. Fortunately he is not having a significant amount of pain at this point in time and  rates his discomfort to be a 1-2 out of 10 02/04/16 no major change since I last saw this wound 3 weeks ago. Depth is 6.3 cm although  his wife reports that there is much less drainage he has completed antibiotics although I don't know that we prescribed them. He had some irritation to the periwound and was given a combination of nystatin and triamcinolone apparently he developed severe irritation from this 02/17/16 this point in time patient seems to be doing well in regard to his back wound. He has had a follow- up with his neurosurgeon Dr. Vertell Limber who is pleased with how this is progressing. Fortunately he has no overall worsening symptoms and no interval signs or symptoms of infection. He tells me that the pain is also not as severe at this point. This is good news. his wife continues to perform the dressing changes at this TREY, SANT. ([id number]) point in time. 03/02/16 surgical wound on the lower lumbar spine. Depth today is 5 cm down from 5.7 last week. This appears to be making gradual progress. There is no pain. Using iodoform packing gauze 03/16/16; surgical wound on the lower lumbar spine. Depth today is 4 cm, according to our intake there is down from 4.5 last time although I have 5 cm listed my notes. Nevertheless over time this appears to be gradually filling in. 03/30/16; 3.8 cm in depth today still using iodoform. 7.6 cm in depth on admission here. No drainage no complaints of pain 04/13/16; 3.8 cm of depth today which is unchanged from last week. However the area appears to probe more superiorly now rather than with any depth. There is no evidence of surrounding infection no drainage for at least less drainage 04/27/16; 1.4 cm in depth today which is quite a major improvement from last time. No drainage. Patient states this itches but is not really describe pain. Still using iodoform packing Electronic Signature(s) Signed: 04/27/2016 4:24:12 PM By: Linton Ham MD Entered By: Linton Ham on 04/27/2016 13:31:37 NIXXON, GILSTRAP  ([id number]) -------------------------------------------------------------------------------- Physical Exam Details Patient Name: Mitchell [name] Date of Service: 04/27/2016 12:30 PM Medical Record Patient Account Number: [id number] Number: Treating RN: Montey Hora December 18, 1934 (81 y.o. Other Clinician: Date of Birth/Sex: Male) Treating Arnold Depinto Primary Care Physician: Tedra Senegal Physician/Extender: G Referring Physician: Tedra Senegal Weeks in Treatment: 55 Constitutional Patient is hypertensive.. Pulse regular and within target range for patient.[name] Respirations regular, non-labored and within target range.. Temperature is normal and within the target range for the patient.. Patient's appearance is neat and clean. Appears in no acute distress. Well nourished and well developed.. Notes Exam; depth is down very nicely this week. We'll see him again in 2 weeks his wife is changing the dressings. There is no erythema and no evidence of infection or tenderness Electronic Signature(s) Signed: 04/27/2016 4:24:12 PM By: Linton Ham MD Entered By: Linton Ham on 04/27/2016 13:33:33 ORVIL, MONTAGUE ([id number]) -------------------------------------------------------------------------------- Physician Orders Details Patient Name: Mitchell [name] Date of Service: 04/27/2016 12:30 PM Medical Record Patient Account Number: [id number] Number: Treating RN: Cornell Barman 1934-09-22 (81 y.o. Other Clinician: Date of Birth/Sex: Male) Treating Dora Simeone Primary Care Physician: Tedra Senegal Physician/Extender: G Referring Physician: Assunta Gambles in Treatment: 57 Verbal / Phone Orders: No Diagnosis Coding Wound Cleansing Wound #1 Midline Back o Clean wound with Normal Saline. o May Shower, gently pat wound dry prior to applying new dressing. Primary Wound Dressing Wound #1 Midline Back o Iodoform packing Gauze Secondary  Dressing Wound #1  Midline Back o Boardered Foam Dressing - Allevyn Life or Equal to protect surrounding skin from tape damage. o Drawtex Dressing Change Frequency Wound #1 Midline Back o Change dressing every day. Follow-up Appointments Wound #1 Midline Back o Return Appointment in 2 weeks. Electronic Signature(s) Signed: 04/27/2016 4:24:12 PM By: Linton Ham MD Signed: 04/28/2016 2:01:17 PM By: Gretta Cool RN, BSN, Kim RN, BSN Entered By: Gretta Cool, RN, BSN, Kim on 04/27/2016 13:13:47 SHYLO, PERSING ([id number]) -------------------------------------------------------------------------------- Problem List Details Patient Name: Mitchell [name] Date of Service: 04/27/2016 12:30 PM Medical Record Patient Account Number: [id number] Number: Treating RN: Montey Hora [birth year] (81 y.o. 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 Other Clinician: Date of Birth/Sex: Male) Treating Princetta Uplinger Primary Care Physician: Tedra Senegal Physician/Extender: G Referring Physician: Assunta Gambles in Treatment: 16 Subjective Chief Complaint Information obtained from Patient 01/28/16 82 year old man admitted to clinic for review of his surgical wound in his lumbar spine area History of Present Illness (HPI) 12/31/15; this is a patient to was increasingly incapacitated over the last year with severe lumbar spinal stenosis and radiculopathy at L4-L5 L5-S1 with a large disc herniation at L2-L3. He had several spinal injections over the last year with no relief in his pain. On July 7 17 he had a redo decompression with fusion at L4-L5 L5-S1 and a laminectomy and microdiscectomy of L2-L3 with pedicle screw fixation L2-S1. Is not really clear to me at the time of this dictation as to the exact course of this wound. However he was discharged to rehabilitation at [facility] and it was very clear at the end of the stay here that there was an open area for which she had a wound VAC for a period of time. A culture on 10/29/15 showed both Pseudomonas and Klebsiella. He was discharged on ciprofloxacin and he still remains on that currently. He has home health going into his home now and doing iodoform packing. He has a follow-up with his neurosurgeon Dr. Vertell Limber tomorrow. There is still moderate amount of drainage. However the patient denies fever or chills or excessive pain. He is working hard with physical therapy in order to gain ambulatory status. He has a right foot drop for which he uses an AFO brace. As far as I can tell he has not had any advanced imaging of the low back although he apparently a has had plain x-rays in the neurosurgeon's office. No recent cultures. 01/14/16; culture from 2 weeks ago was negative. He went to sees Dr. Vertell Limber last week who is still was reluctant to proceed with any more imaging studies, they follow with him on 2023-02-04. They're using Aquacel Ag packing but still having copious amounts of clear  yellowish drainage. This is being changed once a day. He has not been systemically unwell, no pain  no fever and really no complaints other than the periwound is itchy 01/28/16 at this point in time patient has had a follow-up visit with Dr. Melven Sartorius neurosurgeon who is still not recommending any further advanced imaging studies at this point in time. We have been using Aquacel Ag packing although Dr. Vertell Limber didn't want him not to pack this as tightly. This is being changed daily. He has no signs or symptoms of systemic infection and his main issue is that he has a rash and itching around the periwound region. They have been using over-the-counter antifungal cream nothing prescribed up to this point. Fortunately he is not having a significant amount of pain at this point in time and rates his discomfort to be a 1-2 out of 10 02/04/16 no major change since I last saw this wound 3 weeks ago. Depth is 6.3 cm although his wife reports that there is much less drainage he has completed antibiotics although I don't know that we prescribed them. He had some irritation to the periwound and was given a combination of nystatin and Loman, Ryot W. ([id number]) triamcinolone apparently he developed severe irritation from this 02/17/16 this point in time patient seems to be doing well in regard to his back wound. He has had a follow- up with his neurosurgeon Dr. Vertell Limber who is pleased with how this is progressing. Fortunately he has no overall worsening symptoms and no interval signs or symptoms of infection. He tells me that the pain is also not as severe at this point. This is good news. his wife continues to perform the dressing changes at this point in time. 03/02/16 surgical wound on the lower lumbar spine. Depth today is 5 cm down from 5.7 last week. This appears to be making gradual progress. There is no pain. Using iodoform packing gauze 03/16/16; surgical wound on the lower lumbar spine. Depth today is  4 cm, according to our intake there is down from 4.5 last time although I have 5 cm listed my notes. Nevertheless over time this appears to be gradually filling in. 03/30/16; 3.8 cm in depth today still using iodoform. 7.6 cm in depth on admission here. No drainage no complaints of pain 04/13/16; 3.8 cm of depth today which is unchanged from last week. However the area appears to probe more superiorly now rather than with any depth. There is no evidence of surrounding infection no drainage for at least less drainage 04/27/16; 1.4 cm in depth today which is quite a major improvement from last time. No drainage. Patient states this itches but is not really describe pain. Still using iodoform packing Objective Constitutional Patient is hypertensive.. Pulse regular and within target range for patient.[name] Respirations regular, non-labored and within target range.. Temperature is normal and within the target range for the patient.. Patient's appearance is neat and clean. Appears in no acute distress. Well nourished and well developed.. Vitals Time Taken: 12:47 PM, Height: 75 in, Weight: 272 lbs, BMI: 34, Pulse: 88 bpm, Respiratory Rate: 18 breaths/min, Blood Pressure: 162/88 mmHg. General Notes: Exam; depth is down very nicely this week. We'll see him again in 2 weeks his wife is changing the dressings. There is no erythema and no evidence of infection or tenderness Integumentary (Hair, Skin) Wound #1 status is Open. Original cause of wound was Surgical Injury. The wound is located on the Midline Back. The wound measures 0.3cm length x 0.4cm width x 1.4cm depth; 0.094cm^2 area and 0.132cm^3 volume. There is a medium amount of serous drainage  noted. The wound margin is indistinct and nonvisible. There is large (67-100%) red granulation within the wound bed. There is no necrotic tissue within the wound bed. The periwound skin appearance exhibited: Moist. The periwound skin appearance did not exhibit:  Callus, Crepitus, Excoriation, Fluctuance, Friable, Induration, Localized Edema, Rash, Scarring, Dry/Scaly, Maceration, Atrophie Blanche, Cyanosis, Ecchymosis, Hemosiderin Staining, Mottled, Pallor, Rubor, Erythema. SHOJI, DIZE ([id number]) General Notes: Unable to visualize wound bed or structures due to size of wound. Assessment Active Problems ICD-10 T81.31XD - Disruption of external operation (surgical) wound, not elsewhere classified, subsequent encounter L98.429 - Non-pressure chronic ulcer of back with unspecified severity M48.06 - Spinal stenosis, lumbar region Plan Wound Cleansing: Wound #1 Midline Back: Clean wound with Normal Saline. May Shower, gently pat wound dry prior to applying new dressing. Primary Wound Dressing: Wound #1 Midline Back: Iodoform packing Gauze Secondary Dressing: Wound #1 Midline Back: Boardered Foam Dressing - Allevyn Life or Equal to protect surrounding skin from tape damage. Drawtex Dressing Change Frequency: Wound #1 Midline Back: Change dressing every day. Follow-up Appointments: Wound #1 Midline Back: Return Appointment in 2 weeks. #1 we are continuing with the iodoform gauze packing,border foam dressing YASUO, PAFFORD ([id number]) Electronic Signature(s) Signed: 04/27/2016 4:24:12 PM By: Linton Ham MD Entered By: Linton Ham on 04/27/2016 13:38:43 LESLIE, DESCHENES ([id number]) -------------------------------------------------------------------------------- SuperBill Details Patient Name: Mitchell [name] Date of Service: 04/27/2016 Medical Record Patient Account Number: [id number] Number: Treating RN: Montey Hora 1934/08/26 (81 y.o. 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 with unspecified severity M48.06 Spinal stenosis, lumbar region Facility Procedures CPT4 Code: [id number] Description: [phone] - WOUND CARE VISIT-LEV 2 EST PT Modifier: Quantity: 1 Physician Procedures CPT4: Description Modifier Quantity Code [id number] - WC PHYS LEVEL 2 - EST PT 1 ICD-10 Description Diagnosis T81.31XD Disruption of external operation (surgical) wound, not elsewhere classified, subsequent encounter L98.429 Non-pressure chronic ulcer  of back with unspecified severity Electronic Signature(s) Signed: 04/27/2016 4:24:12 PM By: Linton Ham MD Entered By: Linton Ham on 04/27/2016 13:39:07

## 2016-04-30 DIAGNOSIS — M545 Low back pain: Secondary | ICD-10-CM | POA: Diagnosis not present

## 2016-04-30 DIAGNOSIS — M5116 Intervertebral disc disorders with radiculopathy, lumbar region: Secondary | ICD-10-CM | POA: Diagnosis not present

## 2016-05-03 DIAGNOSIS — M545 Low back pain: Secondary | ICD-10-CM | POA: Diagnosis not present

## 2016-05-03 DIAGNOSIS — M5116 Intervertebral disc disorders with radiculopathy, lumbar region: Secondary | ICD-10-CM | POA: Diagnosis not present

## 2016-05-07 DIAGNOSIS — M5116 Intervertebral disc disorders with radiculopathy, lumbar region: Secondary | ICD-10-CM | POA: Diagnosis not present

## 2016-05-07 DIAGNOSIS — M545 Low back pain: Secondary | ICD-10-CM | POA: Diagnosis not present

## 2016-05-10 DIAGNOSIS — M5116 Intervertebral disc disorders with radiculopathy, lumbar region: Secondary | ICD-10-CM | POA: Diagnosis not present

## 2016-05-10 DIAGNOSIS — M545 Low back pain: Secondary | ICD-10-CM | POA: Diagnosis not present

## 2016-05-11 ENCOUNTER — Encounter: Payer: Medicare Other | Admitting: Internal Medicine

## 2016-05-11 DIAGNOSIS — T8131XD Disruption of external operation (surgical) wound, not elsewhere classified, subsequent encounter: Secondary | ICD-10-CM | POA: Diagnosis not present

## 2016-05-11 DIAGNOSIS — M48061 Spinal stenosis, lumbar region without neurogenic claudication: Secondary | ICD-10-CM | POA: Diagnosis not present

## 2016-05-11 DIAGNOSIS — L98429 Non-pressure chronic ulcer of back with unspecified severity: Secondary | ICD-10-CM | POA: Diagnosis not present

## 2016-05-11 DIAGNOSIS — T8189XA Other complications of procedures, not elsewhere classified, initial encounter: Secondary | ICD-10-CM | POA: Diagnosis not present

## 2016-05-12 DIAGNOSIS — M545 Low back pain: Secondary | ICD-10-CM | POA: Diagnosis not present

## 2016-05-12 DIAGNOSIS — M5116 Intervertebral disc disorders with radiculopathy, lumbar region: Secondary | ICD-10-CM | POA: Diagnosis not present

## 2016-05-12 NOTE — Progress Notes (Addendum)
Mitchell [name], Mitchell [name] ([id number]) Visit Report for 05/11/2016 Arrival Information Details Patient Name: Mitchell [name] Date of Service: 05/11/2016 12:30 PM Medical Record Number: [id number] Patient Account Number: [id number] Date of Birth/Sex: [birth year] (82 y.o. Male) Treating RN: Montey Hora Primary Care Lillan Mccreadie: Tedra Senegal Other Clinician: Referring Basim Bartnik: Tedra Senegal Treating Sophya Vanblarcom/Extender: Tito Dine in Treatment: 18 Visit Information History Since Last Visit Added or deleted any medications: No Patient Arrived: Walker Any new allergies or adverse reactions: No Arrival Time: 12:44 Had a fall or experienced change in No Accompanied By: spouse activities of daily living that may affect Transfer Assistance: None risk of falls: Patient Identification Verified: Yes Signs or symptoms of abuse/neglect since last No Secondary Verification Process Completed: Yes visito Patient Requires Transmission-Based No Hospitalized since last visit: No Precautions: Has Dressing in Place as Prescribed: Yes Patient Has Alerts: No Pain Present Now: No Electronic Signature(s) Signed: 05/11/2016 4:54:21 PM By: Montey Hora Entered By: Montey Hora on 05/11/2016 12:45:11 Mitchell [name] ([id number]) -------------------------------------------------------------------------------- Clinic Level of Care Assessment Details Patient Name: Mitchell [name] Date of Service: 05/11/2016 12:30 PM Medical Record Number: [id number] Patient Account Number: [id number] Date of Birth/Sex: Aug 25, 1934 (82 y.o. Male) Treating RN: Montey Hora Primary Care Wilene Pharo: Tedra Senegal Other Clinician: Referring Allegra Cerniglia: Tedra Senegal Treating Damacio Weisgerber/Extender: Tito Dine in Treatment: 18 Clinic Level of Care Assessment Items TOOL 4 Quantity Score []  - Use when only an EandM is performed on FOLLOW-UP visit 0 ASSESSMENTS - Nursing Assessment / Reassessment X - Reassessment of  Co-morbidities (includes updates in patient status) 1 10 X - Reassessment of Adherence to Treatment Plan 1 5 ASSESSMENTS - Wound and Skin Assessment / Reassessment X - Simple Wound Assessment / Reassessment - one wound 1 5 []  - Complex Wound Assessment / Reassessment - multiple wounds 0 []  - Dermatologic / Skin Assessment (not related to wound area) 0 ASSESSMENTS - Focused Assessment []  - Circumferential Edema Measurements - multi extremities 0 []  - Nutritional Assessment / Counseling / Intervention 0 []  - Lower Extremity Assessment (monofilament, tuning fork, pulses) 0 []  - Peripheral Arterial Disease Assessment (using hand held doppler) 0 ASSESSMENTS - Ostomy and/or Continence Assessment and Care []  - Incontinence Assessment and Management 0 []  - Ostomy Care Assessment and Management (repouching, etc.) 0 PROCESS - Coordination of Care X - Simple Patient / Family Education for ongoing care 1 15 []  - Complex (extensive) Patient / Family Education for ongoing care 0 []  - Staff obtains Programmer, systems, Records, Test Results / Process Orders 0 []  - Staff telephones HHA, Nursing Homes / Clarify orders / etc 0 []  - Routine Transfer to another Facility (non-emergent condition) 0 [name], [name] ([id number]) []  - Routine Hospital Admission (non-emergent condition) 0 []  - New Admissions / Biomedical engineer / Ordering NPWT, Apligraf, etc. 0 []  - Emergency Hospital Admission (emergent condition) 0 X - Simple Discharge Coordination 1 10 []  - Complex (extensive) Discharge Coordination 0 PROCESS - Special Needs []  - Pediatric / Minor Patient Management 0 []  - Isolation Patient Management 0 []  - Hearing / Language / Visual special needs 0 []  - Assessment of Community assistance (transportation, D/C planning, etc.) 0 []  - Additional assistance / Altered mentation 0 []  - Support Surface(s) Assessment (bed, cushion, seat, etc.) 0 INTERVENTIONS - Wound Cleansing / Measurement X - Simple Wound  Cleansing - one wound 1 5 []  - Complex Wound Cleansing - multiple wounds 0 X - Wound Imaging (photographs - any number of wounds) 1 5 []  -  Wound Tracing (instead of photographs) 0 X - Simple Wound Measurement - one wound 1 5 []  - Complex Wound Measurement - multiple wounds 0 INTERVENTIONS - Wound Dressings X - Small Wound Dressing one or multiple wounds 1 10 []  - Medium Wound Dressing one or multiple wounds 0 []  - Large Wound Dressing one or multiple wounds 0 []  - Application of Medications - topical 0 []  - Application of Medications - injection 0 INTERVENTIONS - Miscellaneous []  - External ear exam 0 [name], [name] ([id number]) []  - Specimen Collection (cultures, biopsies, blood, body fluids, etc.) 0 []  - Specimen(s) / Culture(s) sent or taken to Lab for analysis 0 []  - Patient Transfer (multiple staff / Harrel Lemon Lift / Similar devices) 0 []  - Simple Staple / Suture removal (25 or less) 0 []  - Complex Staple / Suture removal (26 or more) 0 []  - Hypo / Hyperglycemic Management (close monitor of Blood Glucose) 0 []  - Ankle / Brachial Index (ABI) - do not check if billed separately 0 X - Vital Signs 1 5 Has the patient been seen at the hospital within the last three years: Yes Total Score: 75 Level Of Care: New/Established - Level 2 Electronic Signature(s) Signed: 05/12/2016 4:52:36 PM By: Montey Hora Entered By: Montey Hora on 05/12/2016 10:02:58 Mitchell [name] ([id number]) -------------------------------------------------------------------------------- Encounter Discharge Information Details Patient Name: Mitchell [name] Date of Service: 05/11/2016 12:30 PM Medical Record Number: [id number] Patient Account Number: [id number] Date of Birth/Sex: 1934-04-30 (82 y.o. Male) Treating RN: Montey Hora Primary Care Amr Sturtevant: Tedra Senegal Other Clinician: Referring Daisuke Bailey: Tedra Senegal Treating Torben Soloway/Extender: Tito Dine in Treatment: 31 Encounter Discharge  Information Items Discharge Pain Level: 0 Discharge Condition: Stable Ambulatory Status: Walker Discharge Destination: Home Transportation: Private Auto Accompanied By: spouse Schedule Follow-up Appointment: Yes Medication Reconciliation completed No and provided to Patient/Care Ivelisse Culverhouse: Provided on Clinical Summary of Care: 05/11/2016 Form Type Recipient Paper Patient HD Electronic Signature(s) Signed: 05/12/2016 10:03:53 AM By: Montey Hora Previous Signature: 05/11/2016 1:13:26 PM Version By: Ruthine Dose Entered By: Montey Hora on 05/12/2016 10:03:53 Mitchell [name] ([id number]) -------------------------------------------------------------------------------- Multi Wound Chart Details Patient Name: Mitchell [name] Date of Service: 05/11/2016 12:30 PM Medical Record Number: [id number] Patient Account Number: [id number] Date of Birth/Sex: [birth year] (82 y.o. Male) Treating RN: Montey Hora Primary Care Emmarose Klinke: Tedra Senegal Other Clinician: Referring Claretta Kendra: Tedra Senegal Treating Lavanya Roa/Extender: Ricard Dillon Weeks in Treatment: 18 Vital Signs Height(in): 75 Pulse(bpm): 115 Weight(lbs): 272 Blood Pressure 145/91 (mmHg): Body Mass Index(BMI): 34 Temperature(F): 98.2 Respiratory Rate 16 (breaths/min): Photos: [N/A:N/A] Wound Location: Back - Midline N/A N/A Wounding Event: Surgical Injury N/A N/A Primary Etiology: Open Surgical Wound N/A N/A Comorbid History: Cataracts, Asthma, N/A N/A Arrhythmia, Hypertension, Osteoarthritis Date Acquired: 10/24/2015 N/A N/A Weeks of Treatment: 18 N/A N/A Wound Status: Open N/A N/A Measurements L x W x D 0.3x0.3x1.3 N/A N/A (cm) Area (cm) : 0.071 N/A N/A Volume (cm) : 0.092 N/A N/A % Reduction in Area: 79.50% N/A N/A % Reduction in Volume: 96.50% N/A N/A Classification: Full Thickness Without N/A N/A Exposed Support Structures Exudate Amount: Medium N/A N/A Exudate Type: Serous N/A N/A Exudate  Color: amber N/A N/A Wound Margin: Indistinct, nonvisible N/A N/A Granulation Amount: Large (67-100%) N/A N/A Ohair, Lelon W. ([id number]) Granulation Quality: Red N/A N/A Necrotic Amount: None Present (0%) N/A N/A Exposed Structures: Fascia: No N/A N/A Fat Layer (Subcutaneous Tissue) Exposed: No Tendon: No Muscle: No Joint: No Bone: No Epithelialization: None N/A N/A Periwound Skin Texture: Excoriation:  No N/A N/A Induration: No Callus: No Crepitus: No Rash: No Scarring: No Periwound Skin Maceration: No N/A N/A Moisture: Dry/Scaly: No Periwound Skin Color: Atrophie Blanche: No N/A N/A Cyanosis: No Ecchymosis: No Erythema: No Hemosiderin Staining: No Mottled: No Pallor: No Rubor: No Tenderness on No N/A N/A Palpation: Wound Preparation: Ulcer Cleansing: N/A N/A Rinsed/Irrigated with Saline Topical Anesthetic Applied: None Treatment Notes Electronic Signature(s) Signed: 05/11/2016 4:44:13 PM By: Linton Ham MD Entered By: Linton Ham on 05/11/2016 13:43:41 Kuechle, Emiliano Dyer ([id number]) -------------------------------------------------------------------------------- Multi-Disciplinary Care Plan Details Patient Name: Mitchell [name] Date of Service: 05/11/2016 12:30 PM Medical Record Number: [id number] Patient Account Number: [id number] Date of Birth/Sex: [birth year] (82 y.o. Male) Treating RN: Montey Hora Primary Care Tiffini Blacksher: Tedra Senegal Other Clinician: Referring Addisyn Leclaire: Tedra Senegal Treating Jemarion Roycroft/Extender: Tito Dine in Treatment: 18 Active Inactive ` Abuse / Safety / Falls / Self Care Management Nursing Diagnoses: Impaired physical mobility Potential for falls Goals: Patient will remain injury free Date Initiated: 12/31/2015 Target Resolution Date: 05/18/2016 Goal Status: Active Interventions: Assess fall risk on admission and as needed Notes: ` Orientation to the Wound Care Program Nursing Diagnoses: Knowledge  deficit related to the wound healing center program Goals: Patient/caregiver will verbalize understanding of the [place] Date Initiated: 12/31/2015 Target Resolution Date: 05/18/2016 Goal Status: Active Interventions: Provide education on orientation to the wound center Notes: ` Wound/Skin Impairment Nursing Diagnoses: Impaired tissue integrity RAJBIR, GERLEMAN. ([id number]) Goals: Patient/caregiver will verbalize understanding of skin care regimen Date Initiated: 12/31/2015 Target Resolution Date: 05/18/2016 Goal Status: Active Ulcer/skin breakdown will have a volume reduction of 30% by week 4 Date Initiated: 12/31/2015 Target Resolution Date: 05/18/2016 Goal Status: Active Ulcer/skin breakdown will have a volume reduction of 50% by week 8 Date Initiated: 12/31/2015 Target Resolution Date: 05/18/2016 Goal Status: Active Ulcer/skin breakdown will have a volume reduction of 80% by week 12 Date Initiated: 12/31/2015 Target Resolution Date: 05/18/2016 Goal Status: Active Ulcer/skin breakdown will heal within 14 weeks Date Initiated: 12/31/2015 Target Resolution Date: 05/18/2016 Goal Status: Active Interventions: Assess patient/caregiver ability to obtain necessary supplies Assess patient/caregiver ability to perform ulcer/skin care regimen upon admission and as needed Assess ulceration(s) every visit Notes: Electronic Signature(s) Signed: 05/11/2016 4:54:21 PM By: Montey Hora Entered By: Montey Hora on 05/11/2016 12:59:46 Mitchell [name] ([id number]) -------------------------------------------------------------------------------- Pain Assessment Details Patient Name: Mitchell [name] Date of Service: 05/11/2016 12:30 PM Medical Record Number: [id number] Patient Account Number: [id number] Date of Birth/Sex: [birth year] (82 y.o. Male) Treating RN: Montey Hora Primary Care Suhayla Chisom: Tedra Senegal Other Clinician: Referring Nikolay Demetriou: Tedra Senegal Treating  Yurianna Tusing/Extender: Tito Dine in Treatment: 18 Active Problems Location of Pain Severity and Description of Pain Patient Has Paino No Site Locations Pain Management and Medication Current Pain Management: Notes Topical or injectable lidocaine is offered to patient for acute pain when surgical debridement is performed. If needed, Patient is instructed to use over the counter pain medication for the following 24-48 hours after debridement. Wound care MDs do not prescribed pain medications. Patient has chronic pain or uncontrolled pain. Patient has been instructed to make an appointment with their Primary Care Physician for pain management. Electronic Signature(s) Signed: 05/11/2016 4:54:21 PM By: Montey Hora Entered By: Montey Hora on 05/11/2016 12:45:38 Mitchell [name] ([id number]) -------------------------------------------------------------------------------- Patient/Caregiver Education Details Patient Name: Mitchell [name] Date of Service: 05/11/2016 12:30 PM Medical Record Number: [id number] Patient Account Number: [id number] Date of Birth/Gender: 22-May-1934 (82 y.o. Male) Treating RN: Montey Hora Primary Care Physician:  Tedra Senegal Other Clinician: Referring Physician: Tedra Senegal Treating Physician/Extender: Tito Dine in Treatment: 68 Education Assessment Education Provided To: Patient and Caregiver Education Topics Provided Wound/Skin Impairment: Handouts: Other: wound care as ordered Methods: Demonstration, Explain/Verbal Responses: State content correctly Electronic Signature(s) Signed: 05/12/2016 4:52:36 PM By: Montey Hora Entered By: Montey Hora on 05/12/2016 10:04:09 Mitchell [name] ([id number]) -------------------------------------------------------------------------------- Wound Assessment Details Patient Name: Mitchell [name] Date of Service: 05/11/2016 12:30 PM Medical Record Number: [id number] Patient Account  Number: [id number] Date of Birth/Sex: [birth year] (82 y.o. Male) Treating RN: Montey Hora Primary Care Kirtis Challis: Tedra Senegal Other Clinician: Referring Karthika Glasper: Tedra Senegal Treating Erica Osuna/Extender: Tito Dine in Treatment: 18 Wound Status Wound Number: 1 Primary Open Surgical Wound Etiology: Wound Location: Back - Midline Wound Open Wounding Event: Surgical Injury Status: Date Acquired: 10/24/2015 Comorbid Cataracts, Asthma, Arrhythmia, Weeks Of Treatment: 18 History: Hypertension, Osteoarthritis Clustered Wound: No Photos Wound Measurements Length: (cm) 0.3 Width: (cm) 0.3 Depth: (cm) 1.3 Area: (cm) 0.071 Volume: (cm) 0.092 % Reduction in Area: 79.5% % Reduction in Volume: 96.5% Epithelialization: None Tunneling: No Undermining: No Wound Description Full Thickness Without Exposed Foul Odor Afte Classification: Support Structures Slough/Fibrino Wound Margin: Indistinct, nonvisible Exudate Medium Amount: Exudate Type: Serous Exudate Color: amber r Cleansing: No No Wound Bed Granulation Amount: Large (67-100%) Exposed Structure Granulation Quality: Red Fascia Exposed: No Necrotic Amount: None Present (0%) Fat Layer (Subcutaneous Tissue) Exposed: No Yorio, Banks W. ([id number]) Tendon Exposed: No Muscle Exposed: No Joint Exposed: No Bone Exposed: No Periwound Skin Texture Texture Color No Abnormalities Noted: No No Abnormalities Noted: No Callus: No Atrophie Blanche: No Crepitus: No Cyanosis: No Excoriation: No Ecchymosis: No Induration: No Erythema: No Rash: No Hemosiderin Staining: No Scarring: No Mottled: No Pallor: No Moisture Rubor: No No Abnormalities Noted: No Dry / Scaly: No Maceration: No Wound Preparation Ulcer Cleansing: Rinsed/Irrigated with Saline Topical Anesthetic Applied: None Treatment Notes Wound #1 (Midline Back) 1. Cleansed with: Clean wound with Normal Saline 2. Anesthetic Topical Lidocaine 4%  cream to wound bed prior to debridement 4. Dressing Applied: Prisma Ag 5. Secondary Dressing Applied Dry Stanton Signature(s) Signed: 05/11/2016 4:54:21 PM By: Montey Hora Entered By: Montey Hora on 05/11/2016 12:58:19 SANTO, HITT ([id number]) -------------------------------------------------------------------------------- [facility] Details Patient Name: Mitchell [name] Date of Service: 05/11/2016 12:30 PM Medical Record Number: [id number] Patient Account Number: [id number] Date of Birth/Sex: [birth year] (82 y.o. Male) Treating RN: Montey Hora Primary Care Remmie Bembenek: Tedra Senegal Other Clinician: Referring Justyce Baby: Tedra Senegal Treating Kolby Myung/Extender: Tito Dine in Treatment: 18 Vital Signs Time Taken: 12:45 Temperature (F): 98.2 Height (in): 75 Pulse (bpm): 115 Weight (lbs): 272 Respiratory Rate (breaths/min): 16 Body Mass Index (BMI): 34 Blood Pressure (mmHg): 145/91 Reference Range: 80 - 120 mg / dl Electronic Signature(s) Signed: 05/11/2016 4:54:21 PM By: Montey Hora Entered By: Montey Hora on 05/11/2016 12:46:36

## 2016-05-12 NOTE — Progress Notes (Signed)
Mitchell [name] ([id number]) Visit Report for 05/11/2016 Chief Complaint Document Details Patient Name: Mitchell [name] Date of Service: 05/11/2016 12:30 PM Medical Record Number: [id number] Patient Account Number: [id number] Date of Birth/Sex: July 05, 1934 (82 y.o. Male) Treating RN: Montey Hora Primary Care Provider: Tedra Senegal Other Clinician: Referring Provider: Tedra Senegal Treating Provider/Extender: Tito Dine in Treatment: 18 Information Obtained from: Patient Chief Complaint 01/28/16 82 year old man admitted to clinic for review of his surgical wound in his lumbar spine area Electronic Signature(s) Signed: 05/11/2016 4:44:13 PM By: Linton Ham MD Entered By: Linton Ham on 05/11/2016 13:43:55 Mitchell [name] ([id number]) -------------------------------------------------------------------------------- HPI Details Patient Name: Mitchell [name] Date of Service: 05/11/2016 12:30 PM Medical Record Number: [id number] Patient Account Number: [id number] Date of Birth/Sex: 1934-05-08 (82 y.o. Male) Treating RN: Montey Hora Primary Care Provider: Tedra Senegal Other Clinician: Referring Provider: Tedra Senegal Treating Provider/Extender: Tito Dine in Treatment: 18 History of Present Illness HPI Description: 12/31/15; this is a patient to was increasingly incapacitated over the last year with severe lumbar spinal stenosis and radiculopathy at L4-L5 L5-S1 with a large disc herniation at L2-L3. He had several spinal injections over the last year with no relief in his pain. On July 7 17 he had a redo decompression with fusion at L4-L5 L5-S1 and a laminectomy and microdiscectomy of L2-L3 with pedicle screw fixation L2-S1. Is not really clear to me at the time of this dictation as to the exact course of this wound. However he was discharged to rehabilitation at [name] and it was very clear at the end of the stay here that there was an open  area for which she had a wound VAC for a period of time. A culture on 10/29/15 showed both Pseudomonas and Klebsiella. He was discharged on ciprofloxacin and he still remains on that currently. He has home health going into his home now and doing iodoform packing. He has a follow-up with his neurosurgeon Dr. Vertell Limber tomorrow. There is still moderate amount of drainage. However the patient denies fever or chills or excessive pain. He is working hard with physical therapy in order to gain ambulatory status. He has a right foot drop for which he uses an AFO brace. As far as I can tell he has not had any advanced imaging of the low back although he apparently a has had plain x-rays in the neurosurgeon's office. No recent cultures. 01/14/16; culture from 2 weeks ago was negative. He went to sees Dr. Vertell Limber last week who is still was reluctant to proceed with any more imaging studies, they follow with him on 27-Jan-2023. They're using Aquacel Ag packing but still having copious amounts of clear yellowish drainage. This is being changed once a day. He has not been systemically unwell, no pain no fever and really no complaints other than the periwound is itchy 01/28/16 at this point in time patient has had a follow-up visit with Dr. Melven Sartorius neurosurgeon who is still not recommending any further advanced imaging studies at this point in time. We have been using Aquacel Ag packing although Dr. Vertell Limber didn't want him not to pack this as tightly. This is being changed daily. He has no signs or symptoms of systemic infection and his main issue is that he has a rash and itching around the periwound region. They have been using over-the-counter antifungal cream nothing prescribed up to this point. Fortunately he is not having a significant amount of pain at this point in time and  rates his discomfort to be a 1-2 out of 10 02/04/16 no major change since I last saw this wound 3 weeks ago. Depth is 6.3 cm although his  wife reports that there is much less drainage he has completed antibiotics although I don't know that we prescribed them. He had some irritation to the periwound and was given a combination of nystatin and triamcinolone apparently he developed severe irritation from this 02/17/16 this point in time patient seems to be doing well in regard to his back wound. He has had a follow- up with his neurosurgeon Dr. Vertell Limber who is pleased with how this is progressing. Fortunately he has no overall worsening symptoms and no interval signs or symptoms of infection. He tells me that the pain is also not as severe at this point. This is good news. his wife continues to perform the dressing changes at this point in time. 03/02/16 surgical wound on the lower lumbar spine. Depth today is 5 cm down from 5.7 last week. This [name], [name] ([id number]) appears to be making gradual progress. There is no pain. Using iodoform packing gauze 03/16/16; surgical wound on the lower lumbar spine. Depth today is 4 cm, according to our intake there is down from 4.5 last time although I have 5 cm listed my notes. Nevertheless over time this appears to be gradually filling in. 03/30/16; 3.8 cm in depth today still using iodoform. 7.6 cm in depth on admission here. No drainage no complaints of pain 04/13/16; 3.8 cm of depth today which is unchanged from last week. However the area appears to probe more superiorly now rather than with any depth. There is no evidence of surrounding infection no drainage for at least less drainage 04/27/16; 1.4 cm in depth today which is quite a major improvement from last time. No drainage. Patient states this itches but is not really describe pain. Still using iodoform packing 05/11/16; unfortunately depth today is still 1.4 cm. Literally no change from 2 weeks ago. Slight this appointment. The base of the wound feels dry there is no evidence of surrounding erythema tenderness or any suggestion  of infection. His wife has not seen any drainage. She arrives saying "I think this is well" Electronic Signature(s) Signed: 05/11/2016 4:44:13 PM By: Linton Ham MD Entered By: Linton Ham on 05/11/2016 13:46:40 ASSER, DAIL ([id number]) -------------------------------------------------------------------------------- Physical Exam Details Patient Name: Mitchell [name] Date of Service: 05/11/2016 12:30 PM Medical Record Number: [id number] Patient Account Number: [id number] Date of Birth/Sex: [birth year] (82 y.o. Male) Treating RN: Montey Hora Primary Care Provider: Tedra Senegal Other Clinician: Referring Provider: Tedra Senegal Treating Provider/Extender: Tito Dine in Treatment: 18 Constitutional Sitting or standing Blood Pressure is within target range for patient.. Pulse regular and within target range for patient.[name] Respirations regular, non-labored and within target range.. Temperature is normal and within the target range for the patient.. Patient's appearance is neat and clean. Appears in no acute distress. Well nourished and well developed.. Notes On exam; no change in depth. There is no surrounding tenderness crepitus or drainage. What I can see of the wound looks dry Electronic Signature(s) Signed: 05/11/2016 4:44:13 PM By: Linton Ham MD Entered By: Linton Ham on 05/11/2016 13:47:25 HALSEY, CRUMEDY ([id number]) -------------------------------------------------------------------------------- Physician Orders Details Patient Name: Mitchell [name] Date of Service: 05/11/2016 12:30 PM Medical Record Number: [id number] Patient Account Number: [id number] Date of Birth/Sex: March 17, 1935 (82 y.o. Male) Treating RN: Montey Hora Primary Care Provider: Tedra Senegal Other Clinician: Referring Provider: Tedra Senegal  Treating Provider/Extender: Tito Dine in Treatment: 15 Verbal / Phone Orders: Yes Clinician: Montey Hora Read Back  and Verified: Yes Diagnosis Coding Wound Cleansing Wound #1 Midline Back o Clean wound with Normal Saline. o May Shower, gently pat wound dry prior to applying new dressing. Primary Wound Dressing Wound #1 Midline Back o Prisma Ag Secondary Dressing Wound #1 Midline Back o Boardered Foam Dressing - Allevyn Life or Equal to protect surrounding skin from tape damage. Dressing Change Frequency Wound #1 Midline Back o Change dressing every day. Follow-up Appointments Wound #1 Midline Back o Return Appointment in 2 weeks. Electronic Signature(s) Signed: 05/11/2016 4:44:13 PM By: Linton Ham MD Signed: 05/11/2016 4:54:21 PM By: Montey Hora Entered By: Montey Hora on 05/11/2016 13:11:11 CORLEY, SALOMONE ([id number]) -------------------------------------------------------------------------------- Problem List Details Patient Name: Mitchell [name] Date of Service: 05/11/2016 12:30 PM Medical Record Number: [id number] Patient Account Number: [id number] Date of Birth/Sex: 06-20-1934 (82 y.o. Male) Treating RN: Montey Hora Primary Care Provider: Tedra Senegal Other Clinician: Referring Provider: Tedra Senegal Treating Provider/Extender: Tito Dine in Treatment: 84 Active Problems ICD-10 Encounter Code Description Active Date Diagnosis T81.31XD Disruption of external operation (surgical) wound, not 12/31/2015 Yes elsewhere classified, subsequent encounter L98.429 Non-pressure chronic ulcer of back with unspecified 12/31/2015 Yes severity M48.06 Spinal stenosis, lumbar region 12/31/2015 Yes Inactive Problems Resolved Problems Electronic Signature(s) Signed: 05/11/2016 4:44:13 PM By: Linton Ham MD Entered By: Linton Ham on 05/11/2016 13:43:33 Mitchell [name] ([id number]) -------------------------------------------------------------------------------- Progress Note Details Patient Name: Mitchell [name] Date of Service: 05/11/2016 12:30  PM Medical Record Number: [id number] Patient Account Number: [id number] Date of Birth/Sex: October 26, 1934 (82 y.o. Male) Treating RN: Montey Hora Primary Care Provider: Tedra Senegal Other Clinician: Referring Provider: Tedra Senegal Treating Provider/Extender: Tito Dine in Treatment: 18 Subjective Chief Complaint Information obtained from Patient 01/28/16 82 year old man admitted to clinic for review of his surgical wound in his lumbar spine area History of Present Illness (HPI) 12/31/15; this is a patient to was increasingly incapacitated over the last year with severe lumbar spinal stenosis and radiculopathy at L4-L5 L5-S1 with a large disc herniation at L2-L3. He had several spinal injections over the last year with no relief in his pain. On July 7 17 he had a redo decompression with fusion at L4-L5 L5-S1 and a laminectomy and microdiscectomy of L2-L3 with pedicle screw fixation L2-S1. Is not really clear to me at the time of this dictation as to the exact course of this wound. However he was discharged to rehabilitation at [facility] and it was very clear at the end of the stay here that there was an open area for which she had a wound VAC for a period of time. A culture on 10/29/15 showed both Pseudomonas and Klebsiella. He was discharged on ciprofloxacin and he still remains on that currently. He has home health going into his home now and doing iodoform packing. He has a follow-up with his neurosurgeon Dr. Vertell Limber tomorrow. There is still moderate amount of drainage. However the patient denies fever or chills or excessive pain. He is working hard with physical therapy in order to gain ambulatory status. He has a right foot drop for which he uses an AFO brace. As far as I can tell he has not had any advanced imaging of the low back although he apparently a has had plain x-rays in the neurosurgeon's office. No recent cultures. 01/14/16; culture from 2 weeks ago was negative.  He went to sees Dr.  Vertell Limber last week who is still was reluctant to proceed with any more imaging studies, they follow with him on Jan 25, 2023. They're using Aquacel Ag packing but still having copious amounts of clear yellowish drainage. This is being changed once a day. He has not been systemically unwell, no pain no fever and really no complaints other than the periwound is itchy 01/28/16 at this point in time patient has had a follow-up visit with Dr. Melven Sartorius neurosurgeon who is still not recommending any further advanced imaging studies at this point in time. We have been using Aquacel Ag packing although Dr. Vertell Limber didn't want him not to pack this as tightly. This is being changed daily. He has no signs or symptoms of systemic infection and his main issue is that he has a rash and itching around the periwound region. They have been using over-the-counter antifungal cream nothing prescribed up to this point. Fortunately he is not having a significant amount of pain at this point in time and rates his discomfort to be a 1-2 out of 10 02/04/16 no major change since I last saw this wound 3 weeks ago. Depth is 6.3 cm although his wife reports that there is much less drainage he has completed antibiotics although I don't know that we prescribed them. He had some irritation to the periwound and was given a combination of nystatin and triamcinolone apparently he developed severe irritation from this TIWAN, MOSCHELLA. ([id number]) 02/17/16 this point in time patient seems to be doing well in regard to his back wound. He has had a follow- up with his neurosurgeon Dr. Vertell Limber who is pleased with how this is progressing. Fortunately he has no overall worsening symptoms and no interval signs or symptoms of infection. He tells me that the pain is also not as severe at this point. This is good news. his wife continues to perform the dressing changes at this point in time. 03/02/16 surgical wound on the lower  lumbar spine. Depth today is 5 cm down from 5.7 last week. This appears to be making gradual progress. There is no pain. Using iodoform packing gauze 03/16/16; surgical wound on the lower lumbar spine. Depth today is 4 cm, according to our intake there is down from 4.5 last time although I have 5 cm listed my notes. Nevertheless over time this appears to be gradually filling in. 03/30/16; 3.8 cm in depth today still using iodoform. 7.6 cm in depth on admission here. No drainage no complaints of pain 04/13/16; 3.8 cm of depth today which is unchanged from last week. However the area appears to probe more superiorly now rather than with any depth. There is no evidence of surrounding infection no drainage for at least less drainage 04/27/16; 1.4 cm in depth today which is quite a major improvement from last time. No drainage. Patient states this itches but is not really describe pain. Still using iodoform packing 05/11/16; unfortunately depth today is still 1.4 cm. Literally no change from 2 weeks ago. Slight this appointment. The base of the wound feels dry there is no evidence of surrounding erythema tenderness or any suggestion of infection. His wife has not seen any drainage. She arrives saying "I think this is well" Objective Constitutional Sitting or standing Blood Pressure is within target range for patient.. Pulse regular and within target range for patient.[name] Respirations regular, non-labored and within target range.. Temperature is normal and within the target range for the patient.. Patient's appearance is neat and clean. Appears in no  acute distress. Well nourished and well developed.. Vitals Time Taken: 12:45 PM, Height: 75 in, Weight: 272 lbs, BMI: 34, Temperature: 98.2 F, Pulse: 115 bpm, Respiratory Rate: 16 breaths/min, Blood Pressure: 145/91 mmHg. General Notes: On exam; no change in depth. There is no surrounding tenderness crepitus or drainage. What I can see of the wound looks  dry Integumentary (Hair, Skin) Wound #1 status is Open. Original cause of wound was Surgical Injury. The wound is located on the Midline Back. The wound measures 0.3cm length x 0.3cm width x 1.3cm depth; 0.071cm^2 area and 0.092cm^3 volume. There is no tunneling or undermining noted. There is a medium amount of serous drainage noted. The wound margin is indistinct and nonvisible. There is large (67-100%) red granulation within the wound bed. There is no necrotic tissue within the wound bed. The periwound skin appearance did not exhibit: Callus, Crepitus, Excoriation, Induration, Rash, Scarring, Dry/Scaly, Maceration, Leandros, Helsley, Selestino W. ([id number]) Cyanosis, Ecchymosis, Hemosiderin Staining, Mottled, Pallor, Rubor, Erythema. Assessment Active Problems ICD-10 T81.31XD - Disruption of external operation (surgical) wound, not elsewhere classified, subsequent encounter L98.429 - Non-pressure chronic ulcer of back with unspecified severity M48.06 - Spinal stenosis, lumbar region Plan Wound Cleansing: Wound #1 Midline Back: Clean wound with Normal Saline. May Shower, gently pat wound dry prior to applying new dressing. Primary Wound Dressing: Wound #1 Midline Back: Prisma Ag Secondary Dressing: Wound #1 Midline Back: Boardered Foam Dressing - Allevyn Life or Equal to protect surrounding skin from tape damage. Dressing Change Frequency: Wound #1 Midline Back: Change dressing every day. Follow-up Appointments: Wound #1 Midline Back: Return Appointment in 2 weeks. I have changed the primary dressing to silver collagen strips with hydrogel, border foam Electronic Signature(s) DANIK, LANYON ([id number]) Signed: 05/11/2016 4:44:13 PM By: Linton Ham MD Entered By: Linton Ham on 05/11/2016 13:48:45 DIMITRY, MEISE ([id number]) -------------------------------------------------------------------------------- SuperBill Details Patient Name: Mitchell [name] Date of Service: 05/11/2016 Medical Record Number: [id number] Patient Account Number: [id number] Date of Birth/Sex: [birth year] (82 y.o. Male) Treating RN: Montey Hora Primary Care Provider: Tedra Senegal Other Clinician: Referring Provider: Tedra Senegal Treating Provider/Extender: Tito Dine in Treatment: 18 Diagnosis Coding ICD-10 Codes Code Description Disruption of external operation (surgical) wound, not elsewhere classified, subsequent T81.31XD encounter L98.429 Non-pressure chronic ulcer of back with unspecified severity M48.06 Spinal stenosis, lumbar region Physician Procedures CPT4: Description Modifier Quantity Code [id number] - WC PHYS LEVEL 2 - EST PT 1 ICD-10 Description Diagnosis T81.31XD Disruption of external operation (surgical) wound, not elsewhere classified, subsequent encounter L98.429 Non-pressure chronic ulcer  of back with unspecified severity Electronic Signature(s) Signed: 05/11/2016 4:44:13 PM By: Linton Ham MD Entered By: Linton Ham on 05/11/2016 13:49:10

## 2016-05-14 DIAGNOSIS — M5116 Intervertebral disc disorders with radiculopathy, lumbar region: Secondary | ICD-10-CM | POA: Diagnosis not present

## 2016-05-14 DIAGNOSIS — M545 Low back pain: Secondary | ICD-10-CM | POA: Diagnosis not present

## 2016-05-17 DIAGNOSIS — M545 Low back pain: Secondary | ICD-10-CM | POA: Diagnosis not present

## 2016-05-17 DIAGNOSIS — M5116 Intervertebral disc disorders with radiculopathy, lumbar region: Secondary | ICD-10-CM | POA: Diagnosis not present

## 2016-05-19 DIAGNOSIS — M48062 Spinal stenosis, lumbar region with neurogenic claudication: Secondary | ICD-10-CM | POA: Diagnosis not present

## 2016-05-19 DIAGNOSIS — M545 Low back pain: Secondary | ICD-10-CM | POA: Diagnosis not present

## 2016-05-19 DIAGNOSIS — M5416 Radiculopathy, lumbar region: Secondary | ICD-10-CM | POA: Diagnosis not present

## 2016-05-19 DIAGNOSIS — M5116 Intervertebral disc disorders with radiculopathy, lumbar region: Secondary | ICD-10-CM | POA: Diagnosis not present

## 2016-05-19 DIAGNOSIS — T8189XD Other complications of procedures, not elsewhere classified, subsequent encounter: Secondary | ICD-10-CM | POA: Diagnosis not present

## 2016-05-21 DIAGNOSIS — M5116 Intervertebral disc disorders with radiculopathy, lumbar region: Secondary | ICD-10-CM | POA: Diagnosis not present

## 2016-05-21 DIAGNOSIS — M545 Low back pain: Secondary | ICD-10-CM | POA: Diagnosis not present

## 2016-05-24 DIAGNOSIS — M5116 Intervertebral disc disorders with radiculopathy, lumbar region: Secondary | ICD-10-CM | POA: Diagnosis not present

## 2016-05-24 DIAGNOSIS — M545 Low back pain: Secondary | ICD-10-CM | POA: Diagnosis not present

## 2016-05-25 ENCOUNTER — Encounter: Payer: Medicare Other | Attending: Internal Medicine | Admitting: Internal Medicine

## 2016-05-25 DIAGNOSIS — M48061 Spinal stenosis, lumbar region without neurogenic claudication: Secondary | ICD-10-CM | POA: Diagnosis not present

## 2016-05-25 DIAGNOSIS — L98429 Non-pressure chronic ulcer of back with unspecified severity: Secondary | ICD-10-CM | POA: Insufficient documentation

## 2016-05-25 DIAGNOSIS — T8131XD Disruption of external operation (surgical) wound, not elsewhere classified, subsequent encounter: Secondary | ICD-10-CM | POA: Diagnosis not present

## 2016-05-25 DIAGNOSIS — Y839 Surgical procedure, unspecified as the cause of abnormal reaction of the patient, or of later complication, without mention of misadventure at the time of the procedure: Secondary | ICD-10-CM | POA: Insufficient documentation

## 2016-05-25 DIAGNOSIS — T8189XA Other complications of procedures, not elsewhere classified, initial encounter: Secondary | ICD-10-CM | POA: Diagnosis not present

## 2016-05-26 DIAGNOSIS — M545 Low back pain: Secondary | ICD-10-CM | POA: Diagnosis not present

## 2016-05-26 DIAGNOSIS — M5116 Intervertebral disc disorders with radiculopathy, lumbar region: Secondary | ICD-10-CM | POA: Diagnosis not present

## 2016-05-27 NOTE — Progress Notes (Signed)
ZEBULUN, BLYTHE ([id number]) Visit Report for 05/25/2016 Chief Complaint Document Details Patient Name: Mitchell [name], Mitchell [name] Date of Service: 05/25/2016 12:30 PM Medical Record Number: [id number] Patient Account Number: [id number] Date of Birth/Sex: [birth year] (82 y.o. Male) Treating RN: Montey Hora Primary Care Provider: Tedra Senegal Other Clinician: Referring Provider: Tedra Senegal Treating Provider/Extender: Tito Dine in Treatment: 20 Information Obtained from: Patient Chief Complaint 01/28/16 82 year old man admitted to clinic for review of his surgical wound in his lumbar spine area Electronic Signature(s) Signed: 05/26/2016 4:28:51 PM By: Linton Ham MD Entered By: Linton Ham on 05/25/2016 13:01:53 Mitchell [name] ([id number]) -------------------------------------------------------------------------------- HPI Details Patient Name: Mitchell [name] Date of Service: 05/25/2016 12:30 PM Medical Record Number: [id number] Patient Account Number: [id number] Date of Birth/Sex: 1935-01-25 (82 y.o. Male) Treating RN: Montey Hora Primary Care Provider: Tedra Senegal Other Clinician: Referring Provider: Tedra Senegal Treating Provider/Extender: Tito Dine in Treatment: 20 History of Present Illness HPI Description: 12/31/15; this is a patient to was increasingly incapacitated over the last year with severe lumbar spinal stenosis and radiculopathy at L4-L5 L5-S1 with a large disc herniation at L2-L3. He had several spinal injections over the last year with no relief in his pain. On July 7 17 he had a redo decompression with fusion at L4-L5 L5-S1 and a laminectomy and microdiscectomy of L2-L3 with pedicle screw fixation L2-S1. Is not really clear to me at the time of this dictation as to the exact course of this wound. However he was discharged to rehabilitation at [facility] and it was very clear at the end of the stay here that there was an open area  for which she had a wound VAC for a period of time. A culture on 10/29/15 showed both Pseudomonas and Klebsiella. He was discharged on ciprofloxacin and he still remains on that currently. He has home health going into his home now and doing iodoform packing. He has a follow-up with his neurosurgeon Dr. Vertell Limber tomorrow. There is still moderate amount of drainage. However the patient denies fever or chills or excessive pain. He is working hard with physical therapy in order to gain ambulatory status. He has a right foot drop for which he uses an AFO brace. As far as I can tell he has not had any advanced imaging of the low back although he apparently a has had plain x-rays in the neurosurgeon's office. No recent cultures. 01/14/16; culture from 2 weeks ago was negative. He went to sees Dr. Vertell Limber last week who is still was reluctant to proceed with any more imaging studies, they follow with him on 10-Feb-2023. They're using Aquacel Ag packing but still having copious amounts of clear yellowish drainage. This is being changed once a day. He has not been systemically unwell, no pain no fever and really no complaints other than the periwound is itchy 01/28/16 at this point in time patient has had a follow-up visit with Dr. Melven Sartorius neurosurgeon who is still not recommending any further advanced imaging studies at this point in time. We have been using Aquacel Ag packing although Dr. Vertell Limber didn't want him not to pack this as tightly. This is being changed daily. He has no signs or symptoms of systemic infection and his main issue is that he has a rash and itching around the periwound region. They have been using over-the-counter antifungal cream nothing prescribed up to this point. Fortunately he is not having a significant amount of pain at this point in time and  rates his discomfort to be a 1-2 out of 10 02/04/16 no major change since I last saw this wound 3 weeks ago. Depth is 6.3 cm although his  wife reports that there is much less drainage he has completed antibiotics although I don't know that we prescribed them. He had some irritation to the periwound and was given a combination of nystatin and triamcinolone apparently he developed severe irritation from this 02/17/16 this point in time patient seems to be doing well in regard to his back wound. He has had a follow- up with his neurosurgeon Dr. Vertell Limber who is pleased with how this is progressing. Fortunately he has no overall worsening symptoms and no interval signs or symptoms of infection. He tells me that the pain is also not as severe at this point. This is good news. his wife continues to perform the dressing changes at this point in time. 03/02/16 surgical wound on the lower lumbar spine. Depth today is 5 cm down from 5.7 last week. This [name], [name] ([id number]) appears to be making gradual progress. There is no pain. Using iodoform packing gauze 03/16/16; surgical wound on the lower lumbar spine. Depth today is 4 cm, according to our intake there is down from 4.5 last time although I have 5 cm listed my notes. Nevertheless over time this appears to be gradually filling in. 03/30/16; 3.8 cm in depth today still using iodoform. 7.6 cm in depth on admission here. No drainage no complaints of pain 04/13/16; 3.8 cm of depth today which is unchanged from last week. However the area appears to probe more superiorly now rather than with any depth. There is no evidence of surrounding infection no drainage for at least less drainage 04/27/16; 1.4 cm in depth today which is quite a major improvement from last time. No drainage. Patient states this itches but is not really describe pain. Still using iodoform packing 05/11/16; unfortunately depth today is still 1.4 cm. Literally no change from 2 weeks ago. Slight this appointment. The base of the wound feels dry there is no evidence of surrounding erythema tenderness or any suggestion  of infection. His wife has not seen any drainage. She arrives saying "I think this is well" 05/25/16; although the depth of this measures 1 cm using my penlight it was difficult to see any open area at the base. This may be fully epithelialized albeit with a divot. There is no drainage tenderness Electronic Signature(s) Signed: 05/26/2016 4:28:51 PM By: Linton Ham MD Entered By: Linton Ham on 05/25/2016 13:02:33 SKYLIER, KESTLER ([id number]) -------------------------------------------------------------------------------- Physical Exam Details Patient Name: Mitchell [name] Date of Service: 05/25/2016 12:30 PM Medical Record Number: [id number] Patient Account Number: [id number] Date of Birth/Sex: 08/10/1934 (82 y.o. Male) Treating RN: Montey Hora Primary Care Provider: Tedra Senegal Other Clinician: Referring Provider: Tedra Senegal Treating Provider/Extender: Tito Dine in Treatment: 64 Constitutional Patient is hypertensive.. Pulse regular and within target range for patient.[name] Respirations regular, non-labored and within target range.. Temperature is normal and within the target range for the patient.. Patient's appearance is neat and clean. Appears in no acute distress. Well nourished and well developed.. Notes Wound exam; no drainage. There is no surrounding tenderness. Using my light I cannot see here visualize any open area. Electronic Signature(s) Signed: 05/26/2016 4:28:51 PM By: Linton Ham MD Entered By: Linton Ham on 05/25/2016 13:03:18 Mitchell [name] ([id number]) -------------------------------------------------------------------------------- Physician Orders Details Patient Name: Mitchell [name] Date of Service: 05/25/2016 12:30 PM Medical Record Number: [id number]  Patient Account Number: [id number] Date of Birth/Sex: 1934-08-24 (82 y.o. Male) Treating RN: Montey Hora Primary Care Provider: Tedra Senegal Other Clinician: Referring Provider:  Tedra Senegal Treating Provider/Extender: Tito Dine in Treatment: 20 Verbal / Phone Orders: Yes Clinician: Montey Hora Read Back and Verified: Yes Diagnosis Coding Wound Cleansing Wound #1 Midline Back o Clean wound with Normal Saline. o May Shower, gently pat wound dry prior to applying new dressing. Primary Wound Dressing Wound #1 Midline Back o Prisma Ag Secondary Dressing Wound #1 Midline Back o Boardered Foam Dressing - Allevyn Life or Equal to protect surrounding skin from tape damage. Dressing Change Frequency Wound #1 Midline Back o Change dressing every day. Follow-up Appointments Wound #1 Midline Back o Return Appointment in 2 weeks. Electronic Signature(s) Signed: 05/25/2016 5:16:51 PM By: Montey Hora Signed: 05/26/2016 4:28:51 PM By: Linton Ham MD Entered By: Montey Hora on 05/25/2016 12:59:29 DEANDREA, BARIA ([id number]) -------------------------------------------------------------------------------- Problem List Details Patient Name: Mitchell [name] Date of Service: 05/25/2016 12:30 PM Medical Record Number: [id number] Patient Account Number: [id number] Date of Birth/Sex: 11/29/1934 (82 y.o. Male) Treating RN: Montey Hora Primary Care Provider: Tedra Senegal Other Clinician: Referring Provider: Tedra Senegal Treating Provider/Extender: Tito Dine in Treatment: 20 Active Problems ICD-10 Encounter Code Description Active Date Diagnosis T81.31XD Disruption of external operation (surgical) wound, not 12/31/2015 Yes elsewhere classified, subsequent encounter L98.429 Non-pressure chronic ulcer of back with unspecified 12/31/2015 Yes severity M48.06 Spinal stenosis, lumbar region 12/31/2015 Yes Inactive Problems Resolved Problems Electronic Signature(s) Signed: 05/26/2016 4:28:51 PM By: Linton Ham MD Entered By: Linton Ham on 05/25/2016 13:01:21 Mitchell [name]  ([id number]) -------------------------------------------------------------------------------- Progress Note Details Patient Name: Mitchell [name] Date of Service: 05/25/2016 12:30 PM Medical Record Number: [id number] Patient Account Number: [id number] Date of Birth/Sex: [birth year] (82 y.o. Male) Treating RN: Montey Hora Primary Care Provider: Tedra Senegal Other Clinician: Referring Provider: Tedra Senegal Treating Provider/Extender: Tito Dine in Treatment: 20 Subjective Chief Complaint Information obtained from Patient 01/28/16 82 year old man admitted to clinic for review of his surgical wound in his lumbar spine area History of Present Illness (HPI) 12/31/15; this is a patient to was increasingly incapacitated over the last year with severe lumbar spinal stenosis and radiculopathy at L4-L5 L5-S1 with a large disc herniation at L2-L3. He had several spinal injections over the last year with no relief in his pain. On July 7 17 he had a redo decompression with fusion at L4-L5 L5-S1 and a laminectomy and microdiscectomy of L2-L3 with pedicle screw fixation L2-S1. Is not really clear to me at the time of this dictation as to the exact course of this wound. However he was discharged to rehabilitation at [facility] and it was very clear at the end of the stay here that there was an open area for which she had a wound VAC for a period of time. A culture on 10/29/15 showed both Pseudomonas and Klebsiella. He was discharged on ciprofloxacin and he still remains on that currently. He has home health going into his home now and doing iodoform packing. He has a follow-up with his neurosurgeon Dr. Vertell Limber tomorrow. There is still moderate amount of drainage. However the patient denies fever or chills or excessive pain. He is working hard with physical therapy in order to gain ambulatory status. He has a right foot drop for which he uses an AFO brace. As far as I can tell he has not had  any advanced imaging of the low back although he  apparently a has had plain x-rays in the neurosurgeon's office. No recent cultures. 01/14/16; culture from 2 weeks ago was negative. He went to sees Dr. Vertell Limber last week who is still was reluctant to proceed with any more imaging studies, they follow with him on Jan 28, 2023. They're using Aquacel Ag packing but still having copious amounts of clear yellowish drainage. This is being changed once a day. He has not been systemically unwell, no pain no fever and really no complaints other than the periwound is itchy 01/28/16 at this point in time patient has had a follow-up visit with Dr. Melven Sartorius neurosurgeon who is still not recommending any further advanced imaging studies at this point in time. We have been using Aquacel Ag packing although Dr. Vertell Limber didn't want him not to pack this as tightly. This is being changed daily. He has no signs or symptoms of systemic infection and his main issue is that he has a rash and itching around the periwound region. They have been using over-the-counter antifungal cream nothing prescribed up to this point. Fortunately he is not having a significant amount of pain at this point in time and rates his discomfort to be a 1-2 out of 10 02/04/16 no major change since I last saw this wound 3 weeks ago. Depth is 6.3 cm although his wife reports that there is much less drainage he has completed antibiotics although I don't know that we prescribed them. He had some irritation to the periwound and was given a combination of nystatin and triamcinolone apparently he developed severe irritation from this LACEDRIC, GAUGH. ([id number]) 02/17/16 this point in time patient seems to be doing well in regard to his back wound. He has had a follow- up with his neurosurgeon Dr. Vertell Limber who is pleased with how this is progressing. Fortunately he has no overall worsening symptoms and no interval signs or symptoms of infection. He tells me  that the pain is also not as severe at this point. This is good news. his wife continues to perform the dressing changes at this point in time. 03/02/16 surgical wound on the lower lumbar spine. Depth today is 5 cm down from 5.7 last week. This appears to be making gradual progress. There is no pain. Using iodoform packing gauze 03/16/16; surgical wound on the lower lumbar spine. Depth today is 4 cm, according to our intake there is down from 4.5 last time although I have 5 cm listed my notes. Nevertheless over time this appears to be gradually filling in. 03/30/16; 3.8 cm in depth today still using iodoform. 7.6 cm in depth on admission here. No drainage no complaints of pain 04/13/16; 3.8 cm of depth today which is unchanged from last week. However the area appears to probe more superiorly now rather than with any depth. There is no evidence of surrounding infection no drainage for at least less drainage 04/27/16; 1.4 cm in depth today which is quite a major improvement from last time. No drainage. Patient states this itches but is not really describe pain. Still using iodoform packing 05/11/16; unfortunately depth today is still 1.4 cm. Literally no change from 2 weeks ago. Slight this appointment. The base of the wound feels dry there is no evidence of surrounding erythema tenderness or any suggestion of infection. His wife has not seen any drainage. She arrives saying "I think this is well" 05/25/16; although the depth of this measures 1 cm using my penlight it was difficult to see any open area at the  base. This may be fully epithelialized albeit with a divot. There is no drainage tenderness Objective Constitutional Patient is hypertensive.. Pulse regular and within target range for patient.[name] Respirations regular, non-labored and within target range.. Temperature is normal and within the target range for the patient.. Patient's appearance is neat and clean. Appears in no acute distress. Well  nourished and well developed.. Vitals Time Taken: 12:40 PM, Height: 75 in, Weight: 272 lbs, BMI: 34, Temperature: 98.0 F, Pulse: 114 bpm, Respiratory Rate: 16 breaths/min, Blood Pressure: 149/98 mmHg. General Notes: Wound exam; no drainage. There is no surrounding tenderness. Using my light I cannot see here visualize any open area. Integumentary (Hair, Skin) Wound #1 status is Open. Original cause of wound was Surgical Injury. The wound is located on the Midline Back. The wound measures 0.3cm length x 0.3cm width x 1cm depth; 0.071cm^2 area and 0.071cm^3 volume. There is no tunneling or undermining noted. There is a medium amount of serous drainage noted. The wound margin is indistinct and nonvisible. There is large (67-100%) red granulation within the wound bed. There is no necrotic tissue within the wound bed. The periwound skin appearance did not exhibit: Callus, Crepitus, Excoriation, Induration, Rash, Scarring, Dry/Scaly, Maceration, Amber, Schillig, Raahil W. ([id number]) Cyanosis, Ecchymosis, Hemosiderin Staining, Mottled, Pallor, Rubor, Erythema. Assessment Active Problems ICD-10 T81.31XD - Disruption of external operation (surgical) wound, not elsewhere classified, subsequent encounter L98.429 - Non-pressure chronic ulcer of back with unspecified severity M48.06 - Spinal stenosis, lumbar region Plan Wound Cleansing: Wound #1 Midline Back: Clean wound with Normal Saline. May Shower, gently pat wound dry prior to applying new dressing. Primary Wound Dressing: Wound #1 Midline Back: Prisma Ag Secondary Dressing: Wound #1 Midline Back: Boardered Foam Dressing - Allevyn Life or Equal to protect surrounding skin from tape damage. Dressing Change Frequency: Wound #1 Midline Back: Change dressing every day. Follow-up Appointments: Wound #1 Midline Back: Return Appointment in 2 weeks. I've asked his wife to continue to address this with collagen hydrogel and a border  foam dressing every 2 days. All of a close look at this in 2 weeks at that point I think this may be healed albeit with a small divot in the skin BREYDAN, COVA ([id number]) Electronic Signature(s) Signed: 05/26/2016 4:28:51 PM By: Linton Ham MD Entered By: Linton Ham on 05/25/2016 13:04:56 Mitchell [name] ([id number]) -------------------------------------------------------------------------------- SuperBill Details Patient Name: Mitchell [name] Date of Service: 05/25/2016 Medical Record Number: [id number] Patient Account Number: [id number] Date of Birth/Sex: [birth year] (82 y.o. Male) Treating RN: Montey Hora Primary Care Provider: Tedra Senegal Other Clinician: Referring Provider: Tedra Senegal Treating Provider/Extender: Ricard Dillon Weeks in Treatment: 20 Diagnosis Coding ICD-10 Codes Code Description Disruption of external operation (surgical) wound, not elsewhere classified, subsequent T81.31XD encounter L98.429 Non-pressure chronic ulcer of back with unspecified severity M48.06 Spinal stenosis, lumbar region Facility Procedures CPT4 Code: [id number] Description: [phone] - WOUND CARE VISIT-LEV 2 EST PT Modifier: Quantity: 1 Physician Procedures CPT4: Description Modifier Quantity Code [id number] - WC PHYS LEVEL 2 - EST PT 1 ICD-10 Description Diagnosis T81.31XD Disruption of external operation (surgical) wound, not elsewhere classified, subsequent encounter L98.429 Non-pressure chronic ulcer  of back with unspecified severity Electronic Signature(s) Signed: 05/26/2016 4:28:51 PM By: Linton Ham MD Entered By: Linton Ham on 05/25/2016 13:05:21

## 2016-05-27 NOTE — Progress Notes (Signed)
Mitchell, [name] ([id number]) Visit Report for 05/25/2016 Arrival Information Details Patient Name: Mitchell, [name] Date of Service: 05/25/2016 12:30 PM Medical Record Number: [id number] Patient Account Number: [id number] Date of Birth/Sex: [name] (82 y.o. Male) Treating RN: Mitchell [name] Primary Care Mitchell [name]: Mitchell [name] Other Clinician: Referring Mitchell [name]: Mitchell [name] Treating Mitchell [name]/Extender: Mitchell [name] in Treatment: 20 Visit Information History Since Last Visit Added or deleted any medications: No Patient Arrived: Walker Any new allergies or adverse reactions: No Arrival Time: 12:39 Had a fall or experienced change in No Accompanied By: spouse activities of daily living that may affect Transfer Assistance: None risk of falls: Patient Identification Verified: Yes Signs or symptoms of abuse/neglect since last No Secondary Verification Process Completed: Yes visito Patient Requires Transmission-Based No Hospitalized since last visit: No Precautions: Has Dressing in Place as Prescribed: Yes Patient Has Alerts: No Pain Present Now: No Electronic Signature(s) Signed: 05/25/2016 5:16:51 PM By: Mitchell [name] Entered By: Mitchell [name] on 05/25/2016 12:39:52 Mitchell [name] ([id number]) -------------------------------------------------------------------------------- Clinic Level of Care Assessment Details Patient Name: Mitchell [name] Date of Service: 05/25/2016 12:30 PM Medical Record Number: [id number] Patient Account Number: [id number] Date of Birth/Sex: [birth year] (82 y.o. Male) Treating RN: Mitchell [name] Primary Care Jamesia Linnen: Mitchell [name] Other Clinician: Referring Mitchell [name]: Mitchell [name] Treating Mitchell [name]/Extender: Mitchell [name] in Treatment: 20 Clinic Level of Care Assessment Items TOOL 4 Quantity Score []  - Use when only an EandM is performed on FOLLOW-UP visit 0 ASSESSMENTS - Nursing Assessment / Reassessment X - Reassessment of  Co-morbidities (includes updates in patient status) 1 10 X - Reassessment of Adherence to Treatment Plan 1 5 ASSESSMENTS - Wound and Skin Assessment / Reassessment X - Simple Wound Assessment / Reassessment - one wound 1 5 []  - Complex Wound Assessment / Reassessment - multiple wounds 0 []  - Dermatologic / Skin Assessment (not related to wound area) 0 ASSESSMENTS - Focused Assessment []  - Circumferential Edema Measurements - multi extremities 0 []  - Nutritional Assessment / Counseling / Intervention 0 []  - Lower Extremity Assessment (monofilament, tuning fork, pulses) 0 []  - Peripheral Arterial Disease Assessment (using hand held doppler) 0 ASSESSMENTS - Ostomy and/or Continence Assessment and Care []  - Incontinence Assessment and Management 0 []  - Ostomy Care Assessment and Management (repouching, etc.) 0 PROCESS - Coordination of Care X - Simple Patient / Family Education for ongoing care 1 15 []  - Complex (extensive) Patient / Family Education for ongoing care 0 []  - Staff obtains Programmer, systems, Records, Test Results / Process Orders 0 []  - Staff telephones HHA, Nursing Homes / Clarify orders / etc 0 []  - Routine Transfer to another Facility (non-emergent condition) 0 [name], [name] ([id number]) []  - Routine Hospital Admission (non-emergent condition) 0 []  - New Admissions / Biomedical engineer / Ordering NPWT, Apligraf, etc. 0 []  - Emergency Hospital Admission (emergent condition) 0 X - Simple Discharge Coordination 1 10 []  - Complex (extensive) Discharge Coordination 0 PROCESS - Special Needs []  - Pediatric / Minor Patient Management 0 []  - Isolation Patient Management 0 []  - Hearing / Language / Visual special needs 0 []  - Assessment of Community assistance (transportation, D/C planning, etc.) 0 []  - Additional assistance / Altered mentation 0 []  - Support Surface(s) Assessment (bed, cushion, seat, etc.) 0 INTERVENTIONS - Wound Cleansing / Measurement X - Simple Wound  Cleansing - one wound 1 5 []  - Complex Wound Cleansing - multiple wounds 0 X - Wound Imaging (photographs - any number of wounds) 1 5 []  -  Wound Tracing (instead of photographs) 0 X - Simple Wound Measurement - one wound 1 5 []  - Complex Wound Measurement - multiple wounds 0 INTERVENTIONS - Wound Dressings X - Small Wound Dressing one or multiple wounds 1 10 []  - Medium Wound Dressing one or multiple wounds 0 []  - Large Wound Dressing one or multiple wounds 0 []  - Application of Medications - topical 0 []  - Application of Medications - injection 0 INTERVENTIONS - Miscellaneous []  - External ear exam 0 [name], [name] ([id number]) []  - Specimen Collection (cultures, biopsies, blood, body fluids, etc.) 0 []  - Specimen(s) / Culture(s) sent or taken to Lab for analysis 0 []  - Patient Transfer (multiple staff / Mitchell [name] Lift / Similar devices) 0 []  - Simple Staple / Suture removal (25 or less) 0 []  - Complex Staple / Suture removal (26 or more) 0 []  - Hypo / Hyperglycemic Management (close monitor of Blood Glucose) 0 []  - Ankle / Brachial Index (ABI) - do not check if billed separately 0 X - Vital Signs 1 5 Has the patient been seen at the hospital within the last three years: Yes Total Score: 75 Level Of Care: New/Established - Level 2 Electronic Signature(s) Signed: 05/25/2016 5:16:51 PM By: Mitchell [name] Entered By: Mitchell [name] on 05/25/2016 12:59:52 Mitchell [name] ([id number]) -------------------------------------------------------------------------------- Encounter Discharge Information Details Patient Name: Mitchell [name] Date of Service: 05/25/2016 12:30 PM Medical Record Number: [id number] Patient Account Number: [id number] Date of Birth/Sex: Mitchell [name][birth year] (82 y.o. Male) Treating RN: Mitchell [name] Primary Care Mitchell [name]: Mitchell [name] Other Clinician: Referring Mitchell [name]: Mitchell [name] Treating Mitchell [name]/Extender: Mitchell [name] in Treatment: 20 Encounter Discharge  Information Items Discharge Pain Level: 0 Discharge Condition: Stable Ambulatory Status: Walker Discharge Destination: Home Transportation: Private Auto Accompanied By: spouse Schedule Follow-up Appointment: Yes Medication Reconciliation completed No and provided to Patient/Care Mitchell [name]: Provided on Clinical Summary of Care: 05/25/2016 Form Type Recipient Paper Patient HD Electronic Signature(s) Signed: 05/25/2016 1:08:55 PM By: Ruthine Dose Entered By: Ruthine Dose on 05/25/2016 13:08:55 Mitchell [name] ([id number]) -------------------------------------------------------------------------------- Multi Wound Chart Details Patient Name: Mitchell [name] Date of Service: 05/25/2016 12:30 PM Medical Record Number: [id number] Patient Account Number: [id number] Date of Birth/Sex: Mitchell [name]/[name] (82 y.o. Male) Treating RN: Mitchell [name] Primary Care Tarnisha Kachmar: Mitchell [name] Other Clinician: Referring Authur Cubit: Mitchell [name] Treating Valory Wetherby/Extender: Ricard Dillon Weeks in Treatment: 20 Vital Signs Height(in): 75 Pulse(bpm): 114 Weight(lbs): 272 Blood Pressure 149/98 (mmHg): Body Mass Index(BMI): 34 Temperature(F): 98.0 Respiratory Rate 16 (breaths/min): Photos: [N/A:N/A] Wound Location: Back - Midline N/A N/A Wounding Event: Surgical Injury N/A N/A Primary Etiology: Open Surgical Wound N/A N/A Comorbid History: Cataracts, Asthma, N/A N/A Arrhythmia, Hypertension, Osteoarthritis Date Acquired: 10/24/2015 N/A N/A Weeks of Treatment: 20 N/A N/A Wound Status: Open N/A N/A Measurements L x W x D 0.3x0.3x1 N/A N/A (cm) Area (cm) : 0.071 N/A N/A Volume (cm) : 0.071 N/A N/A % Reduction in Area: 79.50% N/A N/A % Reduction in Volume: 97.30% N/A N/A Classification: Full Thickness Without N/A N/A Exposed Support Structures Exudate Amount: Medium N/A N/A Exudate Type: Serous N/A N/A Exudate Color: amber N/A N/A Wound Margin: Indistinct, nonvisible N/A N/A Granulation  Amount: Large (67-100%) N/A N/A [name], [name] ([id number]) Granulation Quality: Red N/A N/A Necrotic Amount: None Present (0%) N/A N/A Exposed Structures: Fascia: No N/A N/A Fat Layer (Subcutaneous Tissue) Exposed: No Tendon: No Muscle: No Joint: No Bone: No Epithelialization: None N/A N/A Periwound Skin Texture: Excoriation: No N/A N/A Induration: No Callus: No Crepitus: No  Rash: No Scarring: No Periwound Skin Maceration: No N/A N/A Moisture: Dry/Scaly: No Periwound Skin Color: Atrophie Blanche: No N/A N/A Cyanosis: No Ecchymosis: No Erythema: No Hemosiderin Staining: No Mottled: No Pallor: No Rubor: No Tenderness on No N/A N/A Palpation: Wound Preparation: Ulcer Cleansing: N/A N/A Rinsed/Irrigated with Saline Topical Anesthetic Applied: None Treatment Notes Electronic Signature(s) Signed: 05/26/2016 4:28:51 PM By: Linton Ham MD Entered By: Linton Ham on 05/25/2016 13:01:33 Mitchell [name] ([id number]) -------------------------------------------------------------------------------- Multi-Disciplinary Care Plan Details Patient Name: Mitchell [name] Date of Service: 05/25/2016 12:30 PM Medical Record Number: [id number] Patient Account Number: [id number] Date of Birth/Sex: [name][birth year]Mitchell [name] (82 y.o. Male) Treating RN: Mitchell [name] Primary Care Madeline Bebout: Mitchell [name] Other Clinician: Referring Calais Svehla: Mitchell [name] Treating Yasuko Lapage/Extender: Mitchell [name] in Treatment: 20 Active Inactive ` Abuse / Safety / Falls / Self Care Management Nursing Diagnoses: Impaired physical mobility Potential for falls Goals: Patient will remain injury free Date Initiated: 12/31/2015 Target Resolution Date: 05/18/2016 Goal Status: Active Interventions: Assess fall risk on admission and as needed Notes: ` Orientation to the Wound Care Program Nursing Diagnoses: Knowledge deficit related to the wound healing center program Goals: Patient/caregiver will  verbalize understanding of the [facility] Program Date Initiated: 12/31/2015 Target Resolution Date: 05/18/2016 Goal Status: Active Interventions: Provide education on orientation to the wound center Notes: ` Wound/Skin Impairment Nursing Diagnoses: Impaired tissue integrity DAEVIN, SWIERK. ([id number]) Goals: Patient/caregiver will verbalize understanding of skin care regimen Date Initiated: 12/31/2015 Target Resolution Date: 05/18/2016 Goal Status: Active Ulcer/skin breakdown will have a volume reduction of 30% by week 4 Date Initiated: 12/31/2015 Target Resolution Date: 05/18/2016 Goal Status: Active Ulcer/skin breakdown will have a volume reduction of 50% by week 8 Date Initiated: 12/31/2015 Target Resolution Date: 05/18/2016 Goal Status: Active Ulcer/skin breakdown will have a volume reduction of 80% by week 12 Date Initiated: 12/31/2015 Target Resolution Date: 05/18/2016 Goal Status: Active Ulcer/skin breakdown will heal within 14 weeks Date Initiated: 12/31/2015 Target Resolution Date: 05/18/2016 Goal Status: Active Interventions: Assess patient/caregiver ability to obtain necessary supplies Assess patient/caregiver ability to perform ulcer/skin care regimen upon admission and as needed Assess ulceration(s) every visit Notes: Electronic Signature(s) Signed: 05/25/2016 5:16:51 PM By: Mitchell [name] Entered By: Mitchell [name] on 05/25/2016 12:47:11 Mitchell [name] ([id number]) -------------------------------------------------------------------------------- Pain Assessment Details Patient Name: Mitchell [name] Date of Service: 05/25/2016 12:30 PM Medical Record Number: [id number] Patient Account Number: [id number] Date of Birth/Sex: [birth year] Mitchell [name] (82 y.o. Male) Treating RN: Mitchell [name] Primary Care Naria Abbey: Mitchell [name] Other Clinician: Referring Allona Gondek: Mitchell [name] Treating Trask Vosler/Extender: Ricard Dillon Weeks in Treatment: 20 Active Problems Location  of Pain Severity and Description of Pain Patient Has Paino No Site Locations Pain Management and Medication Current Pain Management: Notes Topical or injectable lidocaine is offered to patient for acute pain when surgical debridement is performed. If needed, Patient is instructed to use over the counter pain medication for the following 24-48 hours after debridement. Wound care MDs do not prescribed pain medications. Patient has chronic pain or uncontrolled pain. Patient has been instructed to make an appointment with their Primary Care Physician for pain management. Electronic Signature(s) Signed: 05/25/2016 5:16:51 PM By: Mitchell [name] Entered By: Mitchell [name] on 05/25/2016 12:39:59 Mitchell [name] ([id number]) -------------------------------------------------------------------------------- Patient/Caregiver Education Details Patient Name: Mitchell [name] Date of Service: 05/25/2016 12:30 PM Medical Record Number: [id number] Patient Account Number: [id number] Date of Birth/Gender: Mitchell [name][birth year] (82 y.o. Male) Treating RN: Mitchell [name] Primary Care Physician: Mitchell [name] Other Clinician: Referring Physician: Tedra [name] Treating  Physician/Extender: Mitchell [name] in Treatment: 20 Education Assessment Education Provided To: Patient and Caregiver Education Topics Provided Wound/Skin Impairment: Handouts: Other: wound care as ordered Methods: Demonstration, Explain/Verbal Responses: State content correctly Electronic Signature(s) Signed: 05/25/2016 5:16:51 PM By: Mitchell [name] Entered By: Mitchell [name] on 05/25/2016 13:07:32 [name], Mitchell Dyer ([id number]) -------------------------------------------------------------------------------- Wound Assessment Details Patient Name: Mitchell [name] Date of Service: 05/25/2016 12:30 PM Medical Record Number: [id number] Patient Account Number: [id number] Date of Birth/Sex: Mitchell [name][birth year] (82 y.o. Male) Treating RN: Mitchell [name] Primary Care Zyren Sevigny: Mitchell [name] Other Clinician: Referring Blasa Raisch: Mitchell [name] Treating Alegria Dominique/Extender: Mitchell [name] in Treatment: 20 Wound Status Wound Number: 1 Primary Open Surgical Wound Etiology: Wound Location: Back - Midline Wound Open Wounding Event: Surgical Injury Status: Date Acquired: 10/24/2015 Comorbid Cataracts, Asthma, Arrhythmia, Weeks Of Treatment: 20 History: Hypertension, Osteoarthritis Clustered Wound: No Photos Wound Measurements Length: (cm) 0.3 Width: (cm) 0.3 Depth: (cm) 1 Area: (cm) 0.071 Volume: (cm) 0.071 % Reduction in Area: 79.5% % Reduction in Volume: 97.3% Epithelialization: None Tunneling: No Undermining: No Wound Description Full Thickness Without Exposed Foul Odor Afte Classification: Support Structures Slough/Fibrino Wound Margin: Indistinct, nonvisible Exudate Medium Amount: Exudate Type: Serous Exudate Color: amber r Cleansing: No No Wound Bed Granulation Amount: Large (67-100%) Exposed Structure Granulation Quality: Red Fascia Exposed: No Necrotic Amount: None Present (0%) Fat Layer (Subcutaneous Tissue) Exposed: No [place], Cyris W. ([id number]) Tendon Exposed: No Muscle Exposed: No Joint Exposed: No Bone Exposed: No Periwound Skin Texture Texture Color No Abnormalities Noted: No No Abnormalities Noted: No Callus: No Atrophie Blanche: No Crepitus: No Cyanosis: No Excoriation: No Ecchymosis: No Induration: No Erythema: No Rash: No Hemosiderin Staining: No Scarring: No Mottled: No Pallor: No Moisture Rubor: No No Abnormalities Noted: No Dry / Scaly: No Maceration: No Wound Preparation Ulcer Cleansing: Rinsed/Irrigated with Saline Topical Anesthetic Applied: None Treatment Notes Wound #1 (Midline Back) 1. Cleansed with: Clean wound with Normal Saline 4. Dressing Applied: Prisma Ag 5. Secondary Dressing Applied Bordered Foam Dressing Electronic Signature(s) Signed:  05/25/2016 5:16:51 PM By: Mitchell [name] Entered By: Mitchell [name] on 05/25/2016 12:46:58 Mitchell [name], Mitchell [name] ([id number]) -------------------------------------------------------------------------------- [place] Details Patient Name: Mitchell [name] Date of Service: 05/25/2016 12:30 PM Medical Record Number: [id number] Patient Account Number: [id number] Date of Birth/Sex: Mitchell [name], Mitchell [name] (82 y.o. Male) Treating RN: Mitchell [name] Primary Care Shanaya Schneck: Mitchell [name] Other Clinician: Referring Laurrie Toppin: Mitchell [name] Treating Annika Selke/Extender: Mitchell [name] in Treatment: 20 Vital Signs Time Taken: 12:40 Temperature (F): 98.0 Height (in): 75 Pulse (bpm): 114 Weight (lbs): 272 Respiratory Rate (breaths/min): 16 Body Mass Index (BMI): 34 Blood Pressure (mmHg): 149/98 Reference Range: 80 - 120 mg / dl Electronic Signature(s) Signed: 05/25/2016 5:16:51 PM By: Mitchell [name] Entered By: Mitchell [name] on 05/25/2016 12:41:56

## 2016-05-28 DIAGNOSIS — M5116 Intervertebral disc disorders with radiculopathy, lumbar region: Secondary | ICD-10-CM | POA: Diagnosis not present

## 2016-05-28 DIAGNOSIS — M545 Low back pain: Secondary | ICD-10-CM | POA: Diagnosis not present

## 2016-05-31 DIAGNOSIS — M545 Low back pain: Secondary | ICD-10-CM | POA: Diagnosis not present

## 2016-05-31 DIAGNOSIS — M5116 Intervertebral disc disorders with radiculopathy, lumbar region: Secondary | ICD-10-CM | POA: Diagnosis not present

## 2016-06-02 DIAGNOSIS — M5116 Intervertebral disc disorders with radiculopathy, lumbar region: Secondary | ICD-10-CM | POA: Diagnosis not present

## 2016-06-02 DIAGNOSIS — M545 Low back pain: Secondary | ICD-10-CM | POA: Diagnosis not present

## 2016-06-04 DIAGNOSIS — M5116 Intervertebral disc disorders with radiculopathy, lumbar region: Secondary | ICD-10-CM | POA: Diagnosis not present

## 2016-06-04 DIAGNOSIS — M545 Low back pain: Secondary | ICD-10-CM | POA: Diagnosis not present

## 2016-06-08 ENCOUNTER — Encounter: Payer: Medicare Other | Admitting: Internal Medicine

## 2016-06-08 DIAGNOSIS — T8189XA Other complications of procedures, not elsewhere classified, initial encounter: Secondary | ICD-10-CM | POA: Diagnosis not present

## 2016-06-08 DIAGNOSIS — M48061 Spinal stenosis, lumbar region without neurogenic claudication: Secondary | ICD-10-CM | POA: Diagnosis not present

## 2016-06-08 DIAGNOSIS — T8131XD Disruption of external operation (surgical) wound, not elsewhere classified, subsequent encounter: Secondary | ICD-10-CM | POA: Diagnosis not present

## 2016-06-08 DIAGNOSIS — L98429 Non-pressure chronic ulcer of back with unspecified severity: Secondary | ICD-10-CM | POA: Diagnosis not present

## 2016-06-09 DIAGNOSIS — M545 Low back pain: Secondary | ICD-10-CM | POA: Diagnosis not present

## 2016-06-09 DIAGNOSIS — M5116 Intervertebral disc disorders with radiculopathy, lumbar region: Secondary | ICD-10-CM | POA: Diagnosis not present

## 2016-06-09 NOTE — Progress Notes (Signed)
[name], [name] ([id number]) Visit Report for 06/08/2016 Arrival Information Details Patient Name: Mitchell [name], Mitchell [name] Date of Service: 06/08/2016 12:30 PM Medical Record Number: [id number] Patient Account Number: [id number] Date of Birth/Sex: 21-Sep-1934 (82 y.o. Male) Treating RN: Montey Hora Primary Care Hansford Hirt: Tedra Senegal Other Clinician: Referring Adriano Bischof: Tedra Senegal Treating Kennette Cuthrell/Extender: Tito Dine in Treatment: 22 Visit Information History Since Last Visit Added or deleted any medications: No Patient Arrived: Walker Any new allergies or adverse reactions: No Arrival Time: 12:40 Had a fall or experienced change in No Accompanied By: spouse activities of daily living that may affect Transfer Assistance: None risk of falls: Patient Identification Verified: Yes Signs or symptoms of abuse/neglect since last No Secondary Verification Process Completed: Yes visito Patient Requires Transmission-Based No Hospitalized since last visit: No Precautions: Has Dressing in Place as Prescribed: Yes Patient Has Alerts: No Pain Present Now: No Electronic Signature(s) Signed: 06/08/2016 4:52:52 PM By: Montey Hora Entered By: Montey Hora on 06/08/2016 12:40:33 Mitchell [name] ([id number]) -------------------------------------------------------------------------------- Clinic Level of Care Assessment Details Patient Name: Mitchell [name] Date of Service: 06/08/2016 12:30 PM Medical Record Number: [id number] Patient Account Number: [id number] Date of Birth/Sex: [birth year] (82 y.o. Male) Treating RN: Montey Hora Primary Care Ranie Chinchilla: Tedra Senegal Other Clinician: Referring Jovita Persing: Tedra Senegal Treating Alyscia Carmon/Extender: Tito Dine in Treatment: 22 Clinic Level of Care Assessment Items TOOL 4 Quantity Score []  - Use when only an EandM is performed on FOLLOW-UP visit 0 ASSESSMENTS - Nursing Assessment / Reassessment X - Reassessment of  Co-morbidities (includes updates in patient status) 1 10 X - Reassessment of Adherence to Treatment Plan 1 5 ASSESSMENTS - Wound and Skin Assessment / Reassessment X - Simple Wound Assessment / Reassessment - one wound 1 5 []  - Complex Wound Assessment / Reassessment - multiple wounds 0 []  - Dermatologic / Skin Assessment (not related to wound area) 0 ASSESSMENTS - Focused Assessment []  - Circumferential Edema Measurements - multi extremities 0 []  - Nutritional Assessment / Counseling / Intervention 0 []  - Lower Extremity Assessment (monofilament, tuning fork, pulses) 0 []  - Peripheral Arterial Disease Assessment (using hand held doppler) 0 ASSESSMENTS - Ostomy and/or Continence Assessment and Care []  - Incontinence Assessment and Management 0 []  - Ostomy Care Assessment and Management (repouching, etc.) 0 PROCESS - Coordination of Care X - Simple Patient / Family Education for ongoing care 1 15 []  - Complex (extensive) Patient / Family Education for ongoing care 0 []  - Staff obtains Programmer, systems, Records, Test Results / Process Orders 0 []  - Staff telephones HHA, Nursing Homes / Clarify orders / etc 0 []  - Routine Transfer to another Facility (non-emergent condition) 0 Escamilla, Ceasar W. ([id number]) []  - Routine Hospital Admission (non-emergent condition) 0 []  - New Admissions / Biomedical engineer / Ordering NPWT, Apligraf, etc. 0 []  - Emergency Hospital Admission (emergent condition) 0 X - Simple Discharge Coordination 1 10 []  - Complex (extensive) Discharge Coordination 0 PROCESS - Special Needs []  - Pediatric / Minor Patient Management 0 []  - Isolation Patient Management 0 []  - Hearing / Language / Visual special needs 0 []  - Assessment of Community assistance (transportation, D/C planning, etc.) 0 []  - Additional assistance / Altered mentation 0 []  - Support Surface(s) Assessment (bed, cushion, seat, etc.) 0 INTERVENTIONS - Wound Cleansing / Measurement X - Simple Wound  Cleansing - one wound 1 5 []  - Complex Wound Cleansing - multiple wounds 0 X - Wound Imaging (photographs - any number of wounds) 1 5 []  -  Wound Tracing (instead of photographs) 0 X - Simple Wound Measurement - one wound 1 5 []  - Complex Wound Measurement - multiple wounds 0 INTERVENTIONS - Wound Dressings X - Small Wound Dressing one or multiple wounds 1 10 []  - Medium Wound Dressing one or multiple wounds 0 []  - Large Wound Dressing one or multiple wounds 0 []  - Application of Medications - topical 0 []  - Application of Medications - injection 0 INTERVENTIONS - Miscellaneous []  - External ear exam 0 Wisener, Darrien W. ([id number]) []  - Specimen Collection (cultures, biopsies, blood, body fluids, etc.) 0 []  - Specimen(s) / Culture(s) sent or taken to Lab for analysis 0 []  - Patient Transfer (multiple staff / Harrel Lemon Lift / Similar devices) 0 []  - Simple Staple / Suture removal (25 or less) 0 []  - Complex Staple / Suture removal (26 or more) 0 []  - Hypo / Hyperglycemic Management (close monitor of Blood Glucose) 0 []  - Ankle / Brachial Index (ABI) - do not check if billed separately 0 X - Vital Signs 1 5 Has the patient been seen at the hospital within the last three years: Yes Total Score: 75 Level Of Care: New/Established - Level 2 Electronic Signature(s) Unsigned Entered By: Montey Hora on 06/09/2016 13:56:35 Signature(s): Date(s): Mitchell [name] ([id number]) -------------------------------------------------------------------------------- Encounter Discharge Information Details Patient Name: Mitchell, [name] Date of Service: 06/08/2016 12:30 PM Medical Record Number: [id number] Patient Account Number: [id number] Date of Birth/Sex: 21-Jan-1935 (82 y.o. Male) Treating RN: Montey Hora Primary Care Makinze Jani: Tedra Senegal Other Clinician: Referring Devion Chriscoe: Tedra Senegal Treating Lujean Ebright/Extender: Tito Dine in Treatment: 22 Encounter Discharge Information  Items Discharge Pain Level: 0 Discharge Condition: Stable Ambulatory Status: Walker Discharge Destination: Home Transportation: Private Auto Accompanied By: spouse Schedule Follow-up Appointment: Yes Medication Reconciliation completed No and provided to Patient/Care Ragina Fenter: Provided on Clinical Summary of Care: 06/08/2016 Form Type Recipient Paper Patient HD Electronic Signature(s) Signed: 06/08/2016 1:32:23 PM By: Montey Hora Previous Signature: 06/08/2016 1:16:51 PM Version By: Ruthine Dose Entered By: Montey Hora on 06/08/2016 13:32:23 Mitchell [name] ([id number]) -------------------------------------------------------------------------------- Multi Wound Chart Details Patient Name: Mitchell [name] Date of Service: 06/08/2016 12:30 PM Medical Record Number: [id number] Patient Account Number: [id number] Date of Birth/Sex: [birth year] (82 y.o. Male) Treating RN: Montey Hora Primary Care Freman Lapage: Tedra Senegal Other Clinician: Referring Urie Loughner: Tedra Senegal Treating Jamillah Camilo/Extender: Ricard Dillon Weeks in Treatment: 22 Vital Signs Height(in): 75 Pulse(bpm): 98 Weight(lbs): 272 Blood Pressure 152/84 (mmHg): Body Mass Index(BMI): 34 Temperature(F): 98.2 Respiratory Rate 18 (breaths/min): Photos: [N/A:N/A] Wound Location: Back - Midline N/A N/A Wounding Event: Surgical Injury N/A N/A Primary Etiology: Open Surgical Wound N/A N/A Comorbid History: Cataracts, Asthma, N/A N/A Arrhythmia, Hypertension, Osteoarthritis Date Acquired: 10/24/2015 N/A N/A Weeks of Treatment: 22 N/A N/A Wound Status: Open N/A N/A Measurements L x W x D 0.2x0.2x0.7 N/A N/A (cm) Area (cm) : 0.031 N/A N/A Volume (cm) : 0.022 N/A N/A % Reduction in Area: 91.00% N/A N/A % Reduction in Volume: 99.20% N/A N/A Classification: Full Thickness Without N/A N/A Exposed Support Structures Exudate Amount: None Present N/A N/A Wound Margin: Indistinct, nonvisible N/A  N/A Granulation Amount: Large (67-100%) N/A N/A Granulation Quality: Red N/A N/A Necrotic Amount: None Present (0%) N/A N/A Swango, Patricio W. ([id number]) Exposed Structures: Fascia: No N/A N/A Fat Layer (Subcutaneous Tissue) Exposed: No Tendon: No Muscle: No Joint: No Bone: No Epithelialization: None N/A N/A Periwound Skin Texture: Excoriation: No N/A N/A Induration: No Callus: No Crepitus: No Rash: No Scarring: No  Periwound Skin Maceration: No N/A N/A Moisture: Dry/Scaly: No Periwound Skin Color: Atrophie Blanche: No N/A N/A Cyanosis: No Ecchymosis: No Erythema: No Hemosiderin Staining: No Mottled: No Pallor: No Rubor: No Tenderness on No N/A N/A Palpation: Wound Preparation: Ulcer Cleansing: N/A N/A Rinsed/Irrigated with Saline Topical Anesthetic Applied: None Treatment Notes Wound #1 (Midline Back) 1. Cleansed with: Clean wound with Normal Saline 4. Dressing Applied: Prisma Ag 5. Secondary [place] Signature(s) Signed: 06/09/2016 9:58:44 AM By: Linton Ham MD Entered By: Linton Ham on 06/08/2016 13:35:12 GERED, DELPONTE ([id number]) -------------------------------------------------------------------------------- [place] Details Patient Name: Mitchell [name] Date of Service: 06/08/2016 12:30 PM Medical Record Number: [id number] Patient Account Number: [id number] Date of Birth/Sex: October 12, 1934 (82 y.o. Male) Treating RN: Montey Hora Primary Care Cacey Willow: Tedra Senegal Other Clinician: Referring Hayes Rehfeldt: Tedra Senegal Treating Abdulkareem Badolato/Extender: Tito Dine in Treatment: 22 Active Inactive ` Abuse / Safety / Falls / Self Care Management Nursing Diagnoses: Impaired physical mobility Potential for falls Goals: Patient will remain injury free Date Initiated: 12/31/2015 Target Resolution Date: 05/18/2016 Goal Status: Active Interventions: Assess fall risk on admission and as  needed Notes: ` Orientation to the Wound Care Program Nursing Diagnoses: Knowledge deficit related to the wound healing center program Goals: Patient/caregiver will verbalize understanding of the [place] Date Initiated: 12/31/2015 Target Resolution Date: 05/18/2016 Goal Status: Active Interventions: Provide education on orientation to the wound center Notes: ` Wound/Skin Impairment Nursing Diagnoses: Impaired tissue integrity LUCIFER, SHIRKEY. ([id number]) Goals: Patient/caregiver will verbalize understanding of skin care regimen Date Initiated: 12/31/2015 Target Resolution Date: 05/18/2016 Goal Status: Active Ulcer/skin breakdown will have a volume reduction of 30% by week 4 Date Initiated: 12/31/2015 Target Resolution Date: 05/18/2016 Goal Status: Active Ulcer/skin breakdown will have a volume reduction of 50% by week 8 Date Initiated: 12/31/2015 Target Resolution Date: 05/18/2016 Goal Status: Active Ulcer/skin breakdown will have a volume reduction of 80% by week 12 Date Initiated: 12/31/2015 Target Resolution Date: 05/18/2016 Goal Status: Active Ulcer/skin breakdown will heal within 14 weeks Date Initiated: 12/31/2015 Target Resolution Date: 05/18/2016 Goal Status: Active Interventions: Assess patient/caregiver ability to obtain necessary supplies Assess patient/caregiver ability to perform ulcer/skin care regimen upon admission and as needed Assess ulceration(s) every visit Notes: Electronic Signature(s) Signed: 06/08/2016 4:52:52 PM By: Montey Hora Entered By: Montey Hora on 06/08/2016 13:07:23 Mitchell [name] ([id number]) -------------------------------------------------------------------------------- Pain Assessment Details Patient Name: Mitchell [name] Date of Service: 06/08/2016 12:30 PM Medical Record Number: [id number] Patient Account Number: [id number] Date of Birth/Sex: [birth year] (82 y.o. Male) Treating RN: Montey Hora Primary  Care Riely Oetken: Tedra Senegal Other Clinician: Referring Veida Spira: Tedra Senegal Treating Benyamin Jeff/Extender: Tito Dine in Treatment: 22 Active Problems Location of Pain Severity and Description of Pain Patient Has Paino No Site Locations Pain Management and Medication Current Pain Management: Notes Topical or injectable lidocaine is offered to patient for acute pain when surgical debridement is performed. If needed, Patient is instructed to use over the counter pain medication for the following 24-48 hours after debridement. Wound care MDs do not prescribed pain medications. Patient has chronic pain or uncontrolled pain. Patient has been instructed to make an appointment with their Primary Care Physician for pain management. Electronic Signature(s) Signed: 06/08/2016 4:52:52 PM By: Montey Hora Entered By: Montey Hora on 06/08/2016 12:40:40 Mitchell [name] ([id number]) -------------------------------------------------------------------------------- Patient/Caregiver Education Details Patient Name: Mitchell [name] Date of Service: 06/08/2016 12:30 PM Medical Record Number: [id number] Patient Account Number: [id number] Date of Birth/Gender: 1934-05-20 (  82 y.o. Male) Treating RN: Montey Hora Primary Care Physician: Tedra Senegal Other Clinician: Referring Physician: Tedra Senegal Treating Physician/Extender: Tito Dine in Treatment: 22 Education Assessment Education Provided To: Patient and Caregiver Education Topics Provided Wound/Skin Impairment: Handouts: Other: wound care as ordered Methods: Demonstration, Explain/Verbal Responses: State content correctly Electronic Signature(s) Signed: 06/08/2016 4:52:52 PM By: Montey Hora Entered By: Montey Hora on 06/08/2016 13:32:43 Sago, Emiliano Dyer ([id number]) -------------------------------------------------------------------------------- Wound Assessment Details Patient Name: Mitchell [name] Date of Service: 06/08/2016 12:30 PM Medical Record Number: [id number] Patient Account Number: [id number] Date of Birth/Sex: 1935-04-02 (82 y.o. Male) Treating RN: Montey Hora Primary Care Doralyn Kirkes: Tedra Senegal Other Clinician: Referring Jorian Willhoite: Tedra Senegal Treating Zakya Halabi/Extender: Tito Dine in Treatment: 22 Wound Status Wound Number: 1 Primary Open Surgical Wound Etiology: Wound Location: Back - Midline Wound Open Wounding Event: Surgical Injury Status: Date Acquired: 10/24/2015 Comorbid Cataracts, Asthma, Arrhythmia, Weeks Of Treatment: 22 History: Hypertension, Osteoarthritis Clustered Wound: No Photos Wound Measurements Length: (cm) 0.2 Width: (cm) 0.2 Depth: (cm) 0.7 Area: (cm) 0.031 Volume: (cm) 0.022 % Reduction in Area: 91% % Reduction in Volume: 99.2% Epithelialization: None Tunneling: No Undermining: No Wound Description Full Thickness Without Exposed Foul Odor Afte Classification: Support Structures Slough/Fibrino Wound Margin: Indistinct, nonvisible Exudate None Present Amount: r Cleansing: No No Wound Bed Granulation Amount: Large (67-100%) Exposed Structure Granulation Quality: Red Fascia Exposed: No Necrotic Amount: None Present (0%) Fat Layer (Subcutaneous Tissue) Exposed: No Tendon Exposed: No Muscle Exposed: No [place], Makayla W. ([id number]) Joint Exposed: No Bone Exposed: No Periwound Skin Texture Texture Color No Abnormalities Noted: No No Abnormalities Noted: No Callus: No Atrophie Blanche: No Crepitus: No Cyanosis: No Excoriation: No Ecchymosis: No Induration: No Erythema: No Rash: No Hemosiderin Staining: No Scarring: No Mottled: No Pallor: No Moisture Rubor: No No Abnormalities Noted: No Dry / Scaly: No Maceration: No Wound Preparation Ulcer Cleansing: Rinsed/Irrigated with Saline Topical Anesthetic Applied: None Treatment Notes Wound #1 (Midline Back) 1. Cleansed with: Clean wound with  Normal Saline 4. Dressing Applied: Prisma Ag 5. Secondary [place] Signature(s) Signed: 06/08/2016 4:52:52 PM By: Montey Hora Entered By: Montey Hora on 06/08/2016 13:03:58 RENNE, ZOUCHA ([id number]) -------------------------------------------------------------------------------- Vitals Details Patient Name: Mitchell [name] Date of Service: 06/08/2016 12:30 PM Medical Record Number: [id number] Patient Account Number: [id number] Date of Birth/Sex: [birth year] (82 y.o. Male) Treating RN: Montey Hora Primary Care Dontrail Blackwell: Tedra Senegal Other Clinician: Referring Keyoni Lapinski: Tedra Senegal Treating Jamesyn Lindell/Extender: Tito Dine in Treatment: 22 Vital Signs Time Taken: 12:40 Temperature (F): 98.2 Height (in): 75 Pulse (bpm): 98 Weight (lbs): 272 Respiratory Rate (breaths/min): 18 Body Mass Index (BMI): 34 Blood Pressure (mmHg): 152/84 Reference Range: 80 - 120 mg / dl Electronic Signature(s) Signed: 06/08/2016 4:52:52 PM By: Montey Hora Entered By: Montey Hora on 06/08/2016 12:42:45

## 2016-06-09 NOTE — Progress Notes (Signed)
Mitchell [name] ([id number]) Visit Report for 06/08/2016 Chief Complaint Document Details Patient Name: Mitchell [name], Mitchell [name] Date of Service: 06/08/2016 12:30 PM Medical Record Number: [id number] Patient Account Number: [id number] Date of Birth/Sex: 04/07/1935 (82 y.o. Male) Treating RN: Montey Hora Primary Care Provider: Tedra Senegal Other Clinician: Referring Provider: Tedra Senegal Treating Provider/Extender: Tito Dine in Treatment: 22 Information Obtained from: Patient Chief Complaint 01/28/16 82 year old man admitted to clinic for review of his surgical wound in his lumbar spine area Electronic Signature(s) Signed: 06/09/2016 9:58:44 AM By: Linton Ham MD Entered By: Linton Ham on 06/08/2016 13:35:55 Mitchell [name] ([id number]) -------------------------------------------------------------------------------- HPI Details Patient Name: Mitchell [name] Date of Service: 06/08/2016 12:30 PM Medical Record Number: [id number] Patient Account Number: [id number] Date of Birth/Sex: 06-14-1934 (82 y.o. Male) Treating RN: Montey Hora Primary Care Provider: Tedra Senegal Other Clinician: Referring Provider: Tedra Senegal Treating Provider/Extender: Tito Dine in Treatment: 22 History of Present Illness HPI Description: 12/31/15; this is a patient to was increasingly incapacitated over the last year with severe lumbar spinal stenosis and radiculopathy at L4-L5 L5-S1 with a large disc herniation at L2-L3. He had several spinal injections over the last year with no relief in his pain. On July 7 17 he had a redo decompression with fusion at L4-L5 L5-S1 and a laminectomy and microdiscectomy of L2-L3 with pedicle screw fixation L2-S1. Is not really clear to me at the time of this dictation as to the exact course of this wound. However he was discharged to rehabilitation at [facility] and it was very clear at the end of the stay here that there was an open  area for which she had a wound VAC for a period of time. A culture on 10/29/15 showed both Pseudomonas and Klebsiella. He was discharged on ciprofloxacin and he still remains on that currently. He has home health going into his home now and doing iodoform packing. He has a follow-up with his neurosurgeon Dr. Vertell Limber tomorrow. There is still moderate amount of drainage. However the patient denies fever or chills or excessive pain. He is working hard with physical therapy in order to gain ambulatory status. He has a right foot drop for which he uses an AFO brace. As far as I can tell he has not had any advanced imaging of the low back although he apparently a has had plain x-rays in the neurosurgeon's office. No recent cultures. 01/14/16; culture from 2 weeks ago was negative. He went to sees Dr. Vertell Limber last week who is still was reluctant to proceed with any more imaging studies, they follow with him on 2023/01/23. They're using Aquacel Ag packing but still having copious amounts of clear yellowish drainage. This is being changed once a day. He has not been systemically unwell, no pain no fever and really no complaints other than the periwound is itchy 01/28/16 at this point in time patient has had a follow-up visit with Dr. Melven Sartorius neurosurgeon who is still not recommending any further advanced imaging studies at this point in time. We have been using Aquacel Ag packing although Dr. Vertell Limber didn't want him not to pack this as tightly. This is being changed daily. He has no signs or symptoms of systemic infection and his main issue is that he has a rash and itching around the periwound region. They have been using over-the-counter antifungal cream nothing prescribed up to this point. Fortunately he is not having a significant amount of pain at this point in time and  rates his discomfort to be a 1-2 out of 10 02/04/16 no major change since I last saw this wound 3 weeks ago. Depth is 6.3 cm although his  wife reports that there is much less drainage he has completed antibiotics although I don't know that we prescribed them. He had some irritation to the periwound and was given a combination of nystatin and triamcinolone apparently he developed severe irritation from this 02/17/16 this point in time patient seems to be doing well in regard to his back wound. He has had a follow- up with his neurosurgeon Dr. Vertell Limber who is pleased with how this is progressing. Fortunately he has no overall worsening symptoms and no interval signs or symptoms of infection. He tells me that the pain is also not as severe at this point. This is good news. his wife continues to perform the dressing changes at this point in time. 03/02/16 surgical wound on the lower lumbar spine. Depth today is 5 cm down from 5.7 last week. This Pollak, Belvin W. ([id number]) appears to be making gradual progress. There is no pain. Using iodoform packing gauze 03/16/16; surgical wound on the lower lumbar spine. Depth today is 4 cm, according to our intake there is down from 4.5 last time although I have 5 cm listed my notes. Nevertheless over time this appears to be gradually filling in. 03/30/16; 3.8 cm in depth today still using iodoform. 7.6 cm in depth on admission here. No drainage no complaints of pain 04/13/16; 3.8 cm of depth today which is unchanged from last week. However the area appears to probe more superiorly now rather than with any depth. There is no evidence of surrounding infection no drainage for at least less drainage 04/27/16; 1.4 cm in depth today which is quite a major improvement from last time. No drainage. Patient states this itches but is not really describe pain. Still using iodoform packing 05/11/16; unfortunately depth today is still 1.4 cm. Literally no change from 2 weeks ago. Slight this appointment. The base of the wound feels dry there is no evidence of surrounding erythema tenderness or any suggestion  of infection. His wife has not seen any drainage. She arrives saying "I think this is well" 05/25/16; although the depth of this measures 1 cm using my penlight it was difficult to see any open area at the base. This may be fully epithelialized albeit with a divot. There is no drainage tenderness 06/08/16; the probing area in this setting of lower lumbar spine surgery. Depth today is 0.7 cm. I cannot see any open area at the base of the wound but then again I can't see the base of the wound. He does not complain of any drainage or tenderness using a silver collagen strip packing. Electronic Signature(s) Signed: 06/09/2016 9:58:44 AM By: Linton Ham MD Entered By: Linton Ham on 06/08/2016 13:38:32 DEMONTE, WRIGHT ([id number]) -------------------------------------------------------------------------------- Physical Exam Details Patient Name: Mitchell [name] Date of Service: 06/08/2016 12:30 PM Medical Record Number: [id number] Patient Account Number: [id number] Date of Birth/Sex: 1934/12/16 (82 y.o. Male) Treating RN: Montey Hora Primary Care Provider: Tedra Senegal Other Clinician: Referring Provider: Tedra Senegal Treating Provider/Extender: Tito Dine in Treatment: 93 Constitutional Patient is hypertensive.. Pulse regular and within target range for patient.[name] Respirations regular, non-labored and within target range.. Temperature is normal and within the target range for the patient.. Patient's appearance is neat and clean. Appears in no acute distress. Well nourished and well developed.. Notes Wound exam; there is no  drainage, no erythema and no tenderness around the small open area. Measures at roughly 0.7 cm. This does not probe to bone. Even with my light I cannot visualize the base of this. Most of what I see is skin that is gone down into the depth of the small open area that remains. Electronic Signature(s) Signed: 06/09/2016 9:58:44 AM By: Linton Ham  MD Entered By: Linton Ham on 06/08/2016 13:39:36 Mitchell [name] ([id number]) -------------------------------------------------------------------------------- Physician Orders Details Patient Name: Mitchell [name] Date of Service: 06/08/2016 12:30 PM Medical Record Number: [id number] Patient Account Number: [id number] Date of Birth/Sex: 06-13-1934 (82 y.o. Male) Treating RN: Montey Hora Primary Care Provider: Tedra Senegal Other Clinician: Referring Provider: Tedra Senegal Treating Provider/Extender: Tito Dine in Treatment: 41 Verbal / Phone Orders: No Diagnosis Coding Wound Cleansing Wound #1 Midline Back o Clean wound with Normal Saline. o May Shower, gently pat wound dry prior to applying new dressing. Primary Wound Dressing Wound #1 Midline Back o Prisma Ag Secondary Dressing Wound #1 Midline Back o Boardered Foam Dressing - Allevyn Life or Equal to protect surrounding skin from tape damage. Dressing Change Frequency Wound #1 Midline Back o Change dressing every day. Follow-up Appointments Wound #1 Midline Back o Return Appointment in 1 month Electronic Signature(s) Signed: 06/08/2016 4:52:52 PM By: Montey Hora Signed: 06/09/2016 9:58:44 AM By: Linton Ham MD Entered By: Montey Hora on 06/08/2016 13:07:56 HARUO, HOCKENSMITH ([id number]) -------------------------------------------------------------------------------- Problem List Details Patient Name: Mitchell [name] Date of Service: 06/08/2016 12:30 PM Medical Record Number: [id number] Patient Account Number: [id number] Date of Birth/Sex: 1934/06/10 (82 y.o. Male) Treating RN: Montey Hora Primary Care Provider: Tedra Senegal Other Clinician: Referring Provider: Tedra Senegal Treating Provider/Extender: Tito Dine in Treatment: 22 Active Problems ICD-10 Encounter Code Description Active Date Diagnosis T81.31XD Disruption of external operation (surgical)  wound, not 12/31/2015 Yes elsewhere classified, subsequent encounter L98.429 Non-pressure chronic ulcer of back with unspecified 12/31/2015 Yes severity M48.06 Spinal stenosis, lumbar region 12/31/2015 Yes Inactive Problems Resolved Problems Electronic Signature(s) Signed: 06/09/2016 9:58:44 AM By: Linton Ham MD Entered By: Linton Ham on 06/08/2016 13:34:57 Mitchell [name] ([id number]) -------------------------------------------------------------------------------- Progress Note Details Patient Name: Mitchell [name] Date of Service: 06/08/2016 12:30 PM Medical Record Number: [id number] Patient Account Number: [id number] Date of Birth/Sex: [birth year] (82 y.o. Male) Treating RN: Montey Hora Primary Care Provider: Tedra Senegal Other Clinician: Referring Provider: Tedra Senegal Treating Provider/Extender: Tito Dine in Treatment: 22 Subjective Chief Complaint Information obtained from Patient 01/28/16 82 year old man admitted to clinic for review of his surgical wound in his lumbar spine area History of Present Illness (HPI) 12/31/15; this is a patient to was increasingly incapacitated over the last year with severe lumbar spinal stenosis and radiculopathy at L4-L5 L5-S1 with a large disc herniation at L2-L3. He had several spinal injections over the last year with no relief in his pain. On July 7 17 he had a redo decompression with fusion at L4-L5 L5-S1 and a laminectomy and microdiscectomy of L2-L3 with pedicle screw fixation L2-S1. Is not really clear to me at the time of this dictation as to the exact course of this wound. However he was discharged to rehabilitation at [facility] and it was very clear at the end of the stay here that there was an open area for which she had a wound VAC for a period of time. A culture on 10/29/15 showed both Pseudomonas and Klebsiella. He was discharged on ciprofloxacin and he still remains on that currently.  He has home  health going into his home now and doing iodoform packing. He has a follow-up with his neurosurgeon Dr. Vertell Limber tomorrow. There is still moderate amount of drainage. However the patient denies fever or chills or excessive pain. He is working hard with physical therapy in order to gain ambulatory status. He has a right foot drop for which he uses an AFO brace. As far as I can tell he has not had any advanced imaging of the low back although he apparently a has had plain x-rays in the neurosurgeon's office. No recent cultures. 01/14/16; culture from 2 weeks ago was negative. He went to sees Dr. Vertell Limber last week who is still was reluctant to proceed with any more imaging studies, they follow with him on 02-09-23. They're using Aquacel Ag packing but still having copious amounts of clear yellowish drainage. This is being changed once a day. He has not been systemically unwell, no pain no fever and really no complaints other than the periwound is itchy 01/28/16 at this point in time patient has had a follow-up visit with Dr. Melven Sartorius neurosurgeon who is still not recommending any further advanced imaging studies at this point in time. We have been using Aquacel Ag packing although Dr. Vertell Limber didn't want him not to pack this as tightly. This is being changed daily. He has no signs or symptoms of systemic infection and his main issue is that he has a rash and itching around the periwound region. They have been using over-the-counter antifungal cream nothing prescribed up to this point. Fortunately he is not having a significant amount of pain at this point in time and rates his discomfort to be a 1-2 out of 10 02/04/16 no major change since I last saw this wound 3 weeks ago. Depth is 6.3 cm although his wife reports that there is much less drainage he has completed antibiotics although I don't know that we prescribed them. He had some irritation to the periwound and was given a combination of nystatin  and triamcinolone apparently he developed severe irritation from this RICCARDO, HENDREN. ([id number]) 02/17/16 this point in time patient seems to be doing well in regard to his back wound. He has had a follow- up with his neurosurgeon Dr. Vertell Limber who is pleased with how this is progressing. Fortunately he has no overall worsening symptoms and no interval signs or symptoms of infection. He tells me that the pain is also not as severe at this point. This is good news. his wife continues to perform the dressing changes at this point in time. 03/02/16 surgical wound on the lower lumbar spine. Depth today is 5 cm down from 5.7 last week. This appears to be making gradual progress. There is no pain. Using iodoform packing gauze 03/16/16; surgical wound on the lower lumbar spine. Depth today is 4 cm, according to our intake there is down from 4.5 last time although I have 5 cm listed my notes. Nevertheless over time this appears to be gradually filling in. 03/30/16; 3.8 cm in depth today still using iodoform. 7.6 cm in depth on admission here. No drainage no complaints of pain 04/13/16; 3.8 cm of depth today which is unchanged from last week. However the area appears to probe more superiorly now rather than with any depth. There is no evidence of surrounding infection no drainage for at least less drainage 04/27/16; 1.4 cm in depth today which is quite a major improvement from last time. No drainage. Patient states this  itches but is not really describe pain. Still using iodoform packing 05/11/16; unfortunately depth today is still 1.4 cm. Literally no change from 2 weeks ago. Slight this appointment. The base of the wound feels dry there is no evidence of surrounding erythema tenderness or any suggestion of infection. His wife has not seen any drainage. She arrives saying "I think this is well" 05/25/16; although the depth of this measures 1 cm using my penlight it was difficult to see any open area at the  base. This may be fully epithelialized albeit with a divot. There is no drainage tenderness 06/08/16; the probing area in this setting of lower lumbar spine surgery. Depth today is 0.7 cm. I cannot see any open area at the base of the wound but then again I can't see the base of the wound. He does not complain of any drainage or tenderness using a silver collagen strip packing. Objective Constitutional Patient is hypertensive.. Pulse regular and within target range for patient.[name] Respirations regular, non-labored and within target range.. Temperature is normal and within the target range for the patient.. Patient's appearance is neat and clean. Appears in no acute distress. Well nourished and well developed.. Vitals Time Taken: 12:40 PM, Height: 75 in, Weight: 272 lbs, BMI: 34, Temperature: 98.2 F, Pulse: 98 bpm, Respiratory Rate: 18 breaths/min, Blood Pressure: 152/84 mmHg. General Notes: Wound exam; there is no drainage, no erythema and no tenderness around the small open area. Measures at roughly 0.7 cm. This does not probe to bone. Even with my light I cannot visualize the base of this. Most of what I see is skin that is gone down into the depth of the small open area that remains. Integumentary (Hair, Skin) Wound #1 status is Open. Original cause of wound was Surgical Injury. The wound is located on the Midline Back. The wound measures 0.2cm length x 0.2cm width x 0.7cm depth; 0.031cm^2 area and 0.022cm^3 Kucher, Graciano W. ([id number]) volume. There is no tunneling or undermining noted. There is a none present amount of drainage noted. The wound margin is indistinct and nonvisible. There is large (67-100%) red granulation within the wound bed. There is no necrotic tissue within the wound bed. The periwound skin appearance did not exhibit: Callus, Crepitus, Excoriation, Induration, Rash, Scarring, Dry/Scaly, Maceration, Atrophie Blanche, Cyanosis, Ecchymosis, Hemosiderin Staining, Mottled,  Pallor, Rubor, Erythema. Assessment Active Problems ICD-10 T81.31XD - Disruption of external operation (surgical) wound, not elsewhere classified, subsequent encounter L98.429 - Non-pressure chronic ulcer of back with unspecified severity M48.06 - Spinal stenosis, lumbar region Plan Wound Cleansing: Wound #1 Midline Back: Clean wound with Normal Saline. May Shower, gently pat wound dry prior to applying new dressing. Primary Wound Dressing: Wound #1 Midline Back: Prisma Ag Secondary Dressing: Wound #1 Midline Back: Boardered Foam Dressing - Allevyn Life or Equal to protect surrounding skin from tape damage. Dressing Change Frequency: Wound #1 Midline Back: Change dressing every day. Follow-up Appointments: Wound #1 Midline Back: Return Appointment in 1 month continue with the silver collagen for another month Hebard, Coury W. ([id number]) this may be as good as we are going to get this area. Electronic Signature(s) Signed: 06/09/2016 9:58:44 AM By: Linton Ham MD Entered By: Linton Ham on 06/08/2016 13:40:58 CHIVAS, WATRING ([id number]) -------------------------------------------------------------------------------- SuperBill Details Patient Name: Mitchell [name] Date of Service: 06/08/2016 Medical Record Number: [id number] Patient Account Number: [id number] Date of Birth/Sex: [birth year] (82 y.o. Male) Treating RN: Montey Hora Primary Care Provider: Tedra Senegal Other Clinician: Referring Provider: Tedra Senegal Treating  Provider/Extender: Ricard Dillon Service Line: Outpatient Weeks in Treatment: 22 Diagnosis Coding ICD-10 Codes Code Description Disruption of external operation (surgical) wound, not elsewhere classified, subsequent T81.31XD encounter L98.429 Non-pressure chronic ulcer of back with unspecified severity M48.06 Spinal stenosis, lumbar region Facility Procedures CPT4 Code: [id number] Description: [phone] - WOUND CARE VISIT-LEV 2 EST  PT Modifier: Quantity: 1 Physician Procedures CPT4: Description Modifier Quantity Code [id number] - WC PHYS LEVEL 2 - EST PT 1 ICD-10 Description Diagnosis T81.31XD Disruption of external operation (surgical) wound, not elsewhere classified, subsequent encounter L98.429 Non-pressure chronic ulcer  of back with unspecified severity Electronic Signature(s) Signed: 06/09/2016 1:56:45 PM By: Montey Hora Signed: 06/09/2016 4:59:22 PM By: Linton Ham MD Previous Signature: 06/09/2016 9:58:44 AM Version By: Linton Ham MD Entered By: Montey Hora on 06/09/2016 13:56:44

## 2016-06-11 DIAGNOSIS — M545 Low back pain: Secondary | ICD-10-CM | POA: Diagnosis not present

## 2016-06-11 DIAGNOSIS — M5116 Intervertebral disc disorders with radiculopathy, lumbar region: Secondary | ICD-10-CM | POA: Diagnosis not present

## 2016-06-14 DIAGNOSIS — M5116 Intervertebral disc disorders with radiculopathy, lumbar region: Secondary | ICD-10-CM | POA: Diagnosis not present

## 2016-06-14 DIAGNOSIS — M545 Low back pain: Secondary | ICD-10-CM | POA: Diagnosis not present

## 2016-06-16 DIAGNOSIS — M5116 Intervertebral disc disorders with radiculopathy, lumbar region: Secondary | ICD-10-CM | POA: Diagnosis not present

## 2016-06-16 DIAGNOSIS — M545 Low back pain: Secondary | ICD-10-CM | POA: Diagnosis not present

## 2016-06-18 ENCOUNTER — Other Ambulatory Visit: Payer: Self-pay | Admitting: Internal Medicine

## 2016-06-18 DIAGNOSIS — M545 Low back pain: Secondary | ICD-10-CM | POA: Diagnosis not present

## 2016-06-18 DIAGNOSIS — M5116 Intervertebral disc disorders with radiculopathy, lumbar region: Secondary | ICD-10-CM | POA: Diagnosis not present

## 2016-06-21 DIAGNOSIS — M545 Low back pain: Secondary | ICD-10-CM | POA: Diagnosis not present

## 2016-06-21 DIAGNOSIS — M5116 Intervertebral disc disorders with radiculopathy, lumbar region: Secondary | ICD-10-CM | POA: Diagnosis not present

## 2016-06-23 DIAGNOSIS — M545 Low back pain: Secondary | ICD-10-CM | POA: Diagnosis not present

## 2016-06-23 DIAGNOSIS — M21371 Foot drop, right foot: Secondary | ICD-10-CM | POA: Diagnosis not present

## 2016-06-23 DIAGNOSIS — M48062 Spinal stenosis, lumbar region with neurogenic claudication: Secondary | ICD-10-CM | POA: Diagnosis not present

## 2016-06-24 DIAGNOSIS — M545 Low back pain: Secondary | ICD-10-CM | POA: Diagnosis not present

## 2016-06-24 DIAGNOSIS — M5116 Intervertebral disc disorders with radiculopathy, lumbar region: Secondary | ICD-10-CM | POA: Diagnosis not present

## 2016-07-01 DIAGNOSIS — M5116 Intervertebral disc disorders with radiculopathy, lumbar region: Secondary | ICD-10-CM | POA: Diagnosis not present

## 2016-07-01 DIAGNOSIS — M545 Low back pain: Secondary | ICD-10-CM | POA: Diagnosis not present

## 2016-07-05 DIAGNOSIS — M545 Low back pain: Secondary | ICD-10-CM | POA: Diagnosis not present

## 2016-07-05 DIAGNOSIS — M5116 Intervertebral disc disorders with radiculopathy, lumbar region: Secondary | ICD-10-CM | POA: Diagnosis not present

## 2016-07-06 ENCOUNTER — Encounter: Payer: Medicare Other | Attending: Internal Medicine | Admitting: Internal Medicine

## 2016-07-06 DIAGNOSIS — Y839 Surgical procedure, unspecified as the cause of abnormal reaction of the patient, or of later complication, without mention of misadventure at the time of the procedure: Secondary | ICD-10-CM | POA: Insufficient documentation

## 2016-07-06 DIAGNOSIS — M48061 Spinal stenosis, lumbar region without neurogenic claudication: Secondary | ICD-10-CM | POA: Diagnosis not present

## 2016-07-06 DIAGNOSIS — T8189XD Other complications of procedures, not elsewhere classified, subsequent encounter: Secondary | ICD-10-CM | POA: Diagnosis not present

## 2016-07-06 DIAGNOSIS — L98429 Non-pressure chronic ulcer of back with unspecified severity: Secondary | ICD-10-CM | POA: Insufficient documentation

## 2016-07-06 DIAGNOSIS — T8131XD Disruption of external operation (surgical) wound, not elsewhere classified, subsequent encounter: Secondary | ICD-10-CM | POA: Insufficient documentation

## 2016-07-07 DIAGNOSIS — M545 Low back pain: Secondary | ICD-10-CM | POA: Diagnosis not present

## 2016-07-07 DIAGNOSIS — M5116 Intervertebral disc disorders with radiculopathy, lumbar region: Secondary | ICD-10-CM | POA: Diagnosis not present

## 2016-07-07 NOTE — Progress Notes (Signed)
CAEDIN, MOGAN ([id number]) Visit Report for 07/06/2016 Chief Complaint Document Details Patient Name: Mitchell [name], Mitchell [name] Date of Service: 07/06/2016 12:30 PM Medical Record Number: [id number] Patient Account Number: [id number] Date of Birth/Sex: 11/27/1934 (82 y.o. Male) Treating RN: Montey Hora Primary Care Provider: Tedra Senegal Other Clinician: Referring Provider: Tedra Senegal Treating Provider/Extender: Tito Dine in Treatment: 26 Information Obtained from: Patient Chief Complaint 01/28/16 82 year old man admitted to clinic for review of his surgical wound in his lumbar spine area Electronic Signature(s) Signed: 07/06/2016 4:28:03 PM By: Linton Ham MD Entered By: Linton Ham on 07/06/2016 13:25:18 Mitchell [name] ([id number]) -------------------------------------------------------------------------------- HPI Details Patient Name: Mitchell [name] Date of Service: 07/06/2016 12:30 PM Medical Record Number: [id number] Patient Account Number: [id number] Date of Birth/Sex: [birth year] (82 y.o. Male) Treating RN: Montey Hora Primary Care Provider: Tedra Senegal Other Clinician: Referring Provider: Tedra Senegal Treating Provider/Extender: Tito Dine in Treatment: 26 History of Present Illness HPI Description: 12/31/15; this is a patient to was increasingly incapacitated over the last year with severe lumbar spinal stenosis and radiculopathy at L4-L5 L5-S1 with a large disc herniation at L2-L3. He had several spinal injections over the last year with no relief in his pain. On July 7 17 he had a redo decompression with fusion at L4-L5 L5-S1 and a laminectomy and microdiscectomy of L2-L3 with pedicle screw fixation L2-S1. Is not really clear to me at the time of this dictation as to the exact course of this wound. However he was discharged to rehabilitation at [facility] and it was very clear at the end of the stay here that there was an open  area for which she had a wound VAC for a period of time. A culture on 10/29/15 showed both Pseudomonas and Klebsiella. He was discharged on ciprofloxacin and he still remains on that currently. He has home health going into his home now and doing iodoform packing. He has a follow-up with his neurosurgeon Dr. Vertell Limber tomorrow. There is still moderate amount of drainage. However the patient denies fever or chills or excessive pain. He is working hard with physical therapy in order to gain ambulatory status. He has a right foot drop for which he uses an AFO brace. As far as I can tell he has not had any advanced imaging of the low back although he apparently a has had plain x-rays in the neurosurgeon's office. No recent cultures. 01/14/16; culture from 2 weeks ago was negative. He went to sees Dr. Vertell Limber last week who is still was reluctant to proceed with any more imaging studies, they follow with him on 02/13/23. They're using Aquacel Ag packing but still having copious amounts of clear yellowish drainage. This is being changed once a day. He has not been systemically unwell, no pain no fever and really no complaints other than the periwound is itchy 01/28/16 at this point in time patient has had a follow-up visit with Dr. Melven Sartorius neurosurgeon who is still not recommending any further advanced imaging studies at this point in time. We have been using Aquacel Ag packing although Dr. Vertell Limber didn't want him not to pack this as tightly. This is being changed daily. He has no signs or symptoms of systemic infection and his main issue is that he has a rash and itching around the periwound region. They have been using over-the-counter antifungal cream nothing prescribed up to this point. Fortunately he is not having a significant amount of pain at this point in time and  rates his discomfort to be a 1-2 out of 10 02/04/16 no major change since I last saw this wound 3 weeks ago. Depth is 6.3 cm although his  wife reports that there is much less drainage he has completed antibiotics although I don't know that we prescribed them. He had some irritation to the periwound and was given a combination of nystatin and triamcinolone apparently he developed severe irritation from this 02/17/16 this point in time patient seems to be doing well in regard to his back wound. He has had a follow- up with his neurosurgeon Dr. Vertell Limber who is pleased with how this is progressing. Fortunately he has no overall worsening symptoms and no interval signs or symptoms of infection. He tells me that the pain is also not as severe at this point. This is good news. his wife continues to perform the dressing changes at this point in time. Mitchell [name] ([id number]) 03/02/16 surgical wound on the lower lumbar spine. Depth today is 5 cm down from 5.7 last week. This appears to be making gradual progress. There is no pain. Using iodoform packing gauze 03/16/16; surgical wound on the lower lumbar spine. Depth today is 4 cm, according to our intake there is down from 4.5 last time although I have 5 cm listed my notes. Nevertheless over time this appears to be gradually filling in. 03/30/16; 3.8 cm in depth today still using iodoform. 7.6 cm in depth on admission here. No drainage no complaints of pain 04/13/16; 3.8 cm of depth today which is unchanged from last week. However the area appears to probe more superiorly now rather than with any depth. There is no evidence of surrounding infection no drainage for at least less drainage 04/27/16; 1.4 cm in depth today which is quite a major improvement from last time. No drainage. Patient states this itches but is not really describe pain. Still using iodoform packing 05/11/16; unfortunately depth today is still 1.4 cm. Literally no change from 2 weeks ago. Slight this appointment. The base of the wound feels dry there is no evidence of surrounding erythema tenderness or any suggestion  of infection. His wife has not seen any drainage. She arrives saying "I think this is well" 05/25/16; although the depth of this measures 1 cm using my penlight it was difficult to see any open area at the base. This may be fully epithelialized albeit with a divot. There is no drainage tenderness 06/08/16; the probing area in this setting of lower lumbar spine surgery. Depth today is 0.7 cm. I cannot see any open area at the base of the wound but then again I can't see the base of the wound. He does not complain of any drainage or tenderness using a silver collagen strip packing. 07/06/16; depth that this definitely appeared better today perhaps 0.2-3. General I cannot see any open wound here. There is a divot with skin going down into it but under the light no open bases seen there is no drainage and no surrounding tenderness Electronic Signature(s) Signed: 07/06/2016 4:28:03 PM By: Linton Ham MD Entered By: Linton Ham on 07/06/2016 13:26:46 Mitchell [name] ([id number]) -------------------------------------------------------------------------------- Physical Exam Details Patient Name: Mitchell [name] Date of Service: 07/06/2016 12:30 PM Medical Record Number: [id number] Patient Account Number: [id number] Date of Birth/Sex: [birth year] (82 y.o. Male) Treating RN: Montey Hora Primary Care Provider: Tedra Senegal Other Clinician: Referring Provider: Tedra Senegal Treating Provider/Extender: Ricard Dillon Weeks in Treatment: 26 Constitutional Sitting or standing Blood Pressure is within  target range for patient.. Pulse regular and within target range for patient.[name] Respirations regular, non-labored and within target range.. Temperature is normal and within the target range for the patient.. Patient's appearance is neat and clean. Appears in no acute distress. Well nourished and well developed.. Notes Wound exam; there is no drainage no erythema and no surrounding tenderness. The  base of this is perhaps 0.3 cm. Under the light I cannot actually visualize over any open area here. Electronic Signature(s) Signed: 07/06/2016 4:28:03 PM By: Linton Ham MD Entered By: Linton Ham on 07/06/2016 13:27:48 Mitchell [name] ([id number]) -------------------------------------------------------------------------------- Physician Orders Details Patient Name: Mitchell [name] Date of Service: 07/06/2016 12:30 PM Medical Record Number: [id number] Patient Account Number: [id number] Date of Birth/Sex: 1934-06-09 (82 y.o. Male) Treating RN: Montey Hora Primary Care Provider: Tedra Senegal Other Clinician: Referring Provider: Tedra Senegal Treating Provider/Extender: Tito Dine in Treatment: 26 Verbal / Phone Orders: No Diagnosis Coding Discharge From [facility] Services o Discharge from [place] Signature(s) Signed: 07/06/2016 4:10:27 PM By: Montey Hora Signed: 07/06/2016 4:28:03 PM By: Linton Ham MD Entered By: Montey Hora on 07/06/2016 12:54:30 Mitchell [name] ([id number]) -------------------------------------------------------------------------------- Problem List Details Patient Name: Mitchell [name] Date of Service: 07/06/2016 12:30 PM Medical Record Number: [id number] Patient Account Number: [id number] Date of Birth/Sex: Jul 12, 1934 (82 y.o. Male) Treating RN: Montey Hora Primary Care Provider: Tedra Senegal Other Clinician: Referring Provider: Tedra Senegal Treating Provider/Extender: Tito Dine in Treatment: 26 Active Problems ICD-10 Encounter Code Description Active Date Diagnosis T81.31XD Disruption of external operation (surgical) wound, not 12/31/2015 Yes elsewhere classified, subsequent encounter L98.429 Non-pressure chronic ulcer of back with unspecified 12/31/2015 Yes severity M48.06 Spinal stenosis, lumbar region 12/31/2015 Yes Inactive Problems Resolved Problems Electronic  Signature(s) Signed: 07/06/2016 4:28:03 PM By: Linton Ham MD Entered By: Linton Ham on 07/06/2016 13:24:11 Mitchell [name] ([id number]) -------------------------------------------------------------------------------- Progress Note Details Patient Name: Mitchell [name] Date of Service: 07/06/2016 12:30 PM Medical Record Number: [id number] Patient Account Number: [id number] Date of Birth/Sex: 01/13/1935 (82 y.o. Male) Treating RN: Montey Hora Primary Care Provider: Tedra Senegal Other Clinician: Referring Provider: Tedra Senegal Treating Provider/Extender: Tito Dine in Treatment: 26 Subjective Chief Complaint Information obtained from Patient 01/28/16 82 year old man admitted to clinic for review of his surgical wound in his lumbar spine area History of Present Illness (HPI) 12/31/15; this is a patient to was increasingly incapacitated over the last year with severe lumbar spinal stenosis and radiculopathy at L4-L5 L5-S1 with a large disc herniation at L2-L3. He had several spinal injections over the last year with no relief in his pain. On July 7 17 he had a redo decompression with fusion at L4-L5 L5-S1 and a laminectomy and microdiscectomy of L2-L3 with pedicle screw fixation L2-S1. Is not really clear to me at the time of this dictation as to the exact course of this wound. However he was discharged to rehabilitation at [facility] and it was very clear at the end of the stay here that there was an open area for which she had a wound VAC for a period of time. A culture on 10/29/15 showed both Pseudomonas and Klebsiella. He was discharged on ciprofloxacin and he still remains on that currently. He has home health going into his home now and doing iodoform packing. He has a follow-up with his neurosurgeon Dr. Vertell Limber tomorrow. There is still moderate amount of drainage. However the patient denies fever or chills or excessive pain. He is working hard with  physical  therapy in order to gain ambulatory status. He has a right foot drop for which he uses an AFO brace. As far as I can tell he has not had any advanced imaging of the low back although he apparently a has had plain x-rays in the neurosurgeon's office. No recent cultures. 01/14/16; culture from 2 weeks ago was negative. He went to sees Dr. Vertell Limber last week who is still was reluctant to proceed with any more imaging studies, they follow with him on 02-05-2023. They're using Aquacel Ag packing but still having copious amounts of clear yellowish drainage. This is being changed once a day. He has not been systemically unwell, no pain no fever and really no complaints other than the periwound is itchy 01/28/16 at this point in time patient has had a follow-up visit with Dr. Melven Sartorius neurosurgeon who is still not recommending any further advanced imaging studies at this point in time. We have been using Aquacel Ag packing although Dr. Vertell Limber didn't want him not to pack this as tightly. This is being changed daily. He has no signs or symptoms of systemic infection and his main issue is that he has a rash and itching around the periwound region. They have been using over-the-counter antifungal cream nothing prescribed up to this point. Fortunately he is not having a significant amount of pain at this point in time and rates his discomfort to be a 1-2 out of 10 02/04/16 no major change since I last saw this wound 3 weeks ago. Depth is 6.3 cm although his wife reports that there is much less drainage he has completed antibiotics although I don't know that we prescribed them. He had some irritation to the periwound and was given a combination of nystatin and triamcinolone apparently he developed severe irritation from this CEYLON, ARENSON. ([id number]) 02/17/16 this point in time patient seems to be doing well in regard to his back wound. He has had a follow- up with his neurosurgeon Dr. Vertell Limber who is pleased with  how this is progressing. Fortunately he has no overall worsening symptoms and no interval signs or symptoms of infection. He tells me that the pain is also not as severe at this point. This is good news. his wife continues to perform the dressing changes at this point in time. 03/02/16 surgical wound on the lower lumbar spine. Depth today is 5 cm down from 5.7 last week. This appears to be making gradual progress. There is no pain. Using iodoform packing gauze 03/16/16; surgical wound on the lower lumbar spine. Depth today is 4 cm, according to our intake there is down from 4.5 last time although I have 5 cm listed my notes. Nevertheless over time this appears to be gradually filling in. 03/30/16; 3.8 cm in depth today still using iodoform. 7.6 cm in depth on admission here. No drainage no complaints of pain 04/13/16; 3.8 cm of depth today which is unchanged from last week. However the area appears to probe more superiorly now rather than with any depth. There is no evidence of surrounding infection no drainage for at least less drainage 04/27/16; 1.4 cm in depth today which is quite a major improvement from last time. No drainage. Patient states this itches but is not really describe pain. Still using iodoform packing 05/11/16; unfortunately depth today is still 1.4 cm. Literally no change from 2 weeks ago. Slight this appointment. The base of the wound feels dry there is no evidence of surrounding erythema tenderness or  any suggestion of infection. His wife has not seen any drainage. She arrives saying "I think this is well" 05/25/16; although the depth of this measures 1 cm using my penlight it was difficult to see any open area at the base. This may be fully epithelialized albeit with a divot. There is no drainage tenderness 06/08/16; the probing area in this setting of lower lumbar spine surgery. Depth today is 0.7 cm. I cannot see any open area at the base of the wound but then again I can't  see the base of the wound. He does not complain of any drainage or tenderness using a silver collagen strip packing. 07/06/16; depth that this definitely appeared better today perhaps 0.2-3. General I cannot see any open wound here. There is a divot with skin going down into it but under the light no open bases seen there is no drainage and no surrounding tenderness Objective Constitutional Sitting or standing Blood Pressure is within target range for patient.. Pulse regular and within target range for patient.[name] Respirations regular, non-labored and within target range.. Temperature is normal and within the target range for the patient.. Patient's appearance is neat and clean. Appears in no acute distress. Well nourished and well developed.. Vitals Time Taken: 12:45 PM, Height: 75 in, Weight: 272 lbs, BMI: 34, Temperature: 98.0 F, Pulse: 119 bpm, Respiratory Rate: 18 breaths/min, Blood Pressure: 136/85 mmHg. General Notes: Wound exam; there is no drainage no erythema and no surrounding tenderness. The base of this is perhaps 0.3 cm. Under the light I cannot actually visualize over any open area here. MARKIES, MOWATT. ([id number]) Integumentary (Hair, Skin) Wound #1 status is Healed - Epithelialized. Original cause of wound was Surgical Injury. The wound is located on the Midline Back. The wound measures 0cm length x 0cm width x 0cm depth; 0cm^2 area and 0cm^3 volume. Assessment Active Problems ICD-10 T81.31XD - Disruption of external operation (surgical) wound, not elsewhere classified, subsequent encounter L98.429 - Non-pressure chronic ulcer of back with unspecified severity M48.06 - Spinal stenosis, lumbar region Plan Discharge From [facility] Services: Discharge from [place] Patient can be discharged from the wound center areas is doing well Electronic Signature(s) Signed: 07/06/2016 4:28:03 PM By: Linton Ham MD Entered By: Linton Ham on 07/06/2016 13:28:37 Mitchell [name] ([id number]) -------------------------------------------------------------------------------- [place] Details Patient Name: Mitchell [name] Date of Service: 07/06/2016 Medical Record Number: [id number] Patient Account Number: [id number] Date of Birth/Sex: 1934-12-25 (82 y.o. Male) Treating RN: Montey Hora Primary Care Provider: Tedra Senegal Other Clinician: Referring Provider: Tedra Senegal Treating Provider/Extender: Ricard Dillon Service Line: Outpatient Weeks in Treatment: 26 Diagnosis Coding ICD-10 Codes Code Description Disruption of external operation (surgical) wound, not elsewhere classified, subsequent T81.31XD encounter L98.429 Non-pressure chronic ulcer of back with unspecified severity M48.06 Spinal stenosis, lumbar region Facility Procedures CPT4 Code: [id number] Description: [phone] - WOUND CARE VISIT-LEV 2 EST PT Modifier: Quantity: 1 Physician Procedures CPT4: Description Modifier Quantity Code [id number] - WC PHYS LEVEL 2 - EST PT 1 ICD-10 Description Diagnosis T81.31XD Disruption of external operation (surgical) wound, not elsewhere classified, subsequent encounter L98.429 Non-pressure chronic ulcer  of back with unspecified severity Electronic Signature(s) Signed: 07/06/2016 4:28:03 PM By: Linton Ham MD Entered By: Linton Ham on 07/06/2016 13:29:16

## 2016-07-07 NOTE — Progress Notes (Signed)
[name], [name] ([id number]) Visit Report for 07/06/2016 Arrival Information Details Patient Name: Mitchell [name] Date of Service: 07/06/2016 12:30 PM Medical Record Number: [id number] Patient Account Number: [id number] Date of Birth/Sex: 09-25-1934 (82 y.o. Male) Treating RN: Montey Hora Primary Care Deshannon Hinchliffe: Tedra Senegal Other Clinician: Referring Joletta Manner: Tedra Senegal Treating Perrin Gens/Extender: Tito Dine in Treatment: 26 Visit Information History Since Last Visit Added or deleted any medications: No Patient Arrived: Walker Any new allergies or adverse reactions: No Arrival Time: 12:40 Had a fall or experienced change in No Accompanied By: spouse activities of daily living that may affect Transfer Assistance: None risk of falls: Patient Identification Verified: Yes Signs or symptoms of abuse/neglect since last No Secondary Verification Process Completed: Yes visito Patient Requires Transmission-Based No Hospitalized since last visit: No Precautions: Has Dressing in Place as Prescribed: Yes Patient Has Alerts: No Pain Present Now: No Electronic Signature(s) Signed: 07/06/2016 4:10:27 PM By: Montey Hora Entered By: Montey Hora on 07/06/2016 12:40:36 Mitchell [name] ([id number]) -------------------------------------------------------------------------------- Clinic Level of Care Assessment Details Patient Name: Mitchell [name] Date of Service: 07/06/2016 12:30 PM Medical Record Number: [id number] Patient Account Number: [id number] Date of Birth/Sex: 1935-03-18 (82 y.o. Male) Treating RN: Montey Hora Primary Care Dilyn Osoria: Tedra Senegal Other Clinician: Referring Coretha Creswell: Tedra Senegal Treating Shontay Wallner/Extender: Tito Dine in Treatment: 26 Clinic Level of Care Assessment Items TOOL 4 Quantity Score []  - Use when only an EandM is performed on FOLLOW-UP visit 0 ASSESSMENTS - Nursing Assessment / Reassessment X - Reassessment of  Co-morbidities (includes updates in patient status) 1 10 X - Reassessment of Adherence to Treatment Plan 1 5 ASSESSMENTS - Wound and Skin Assessment / Reassessment X - Simple Wound Assessment / Reassessment - one wound 1 5 []  - Complex Wound Assessment / Reassessment - multiple wounds 0 []  - Dermatologic / Skin Assessment (not related to wound area) 0 ASSESSMENTS - Focused Assessment []  - Circumferential Edema Measurements - multi extremities 0 []  - Nutritional Assessment / Counseling / Intervention 0 []  - Lower Extremity Assessment (monofilament, tuning fork, pulses) 0 []  - Peripheral Arterial Disease Assessment (using hand held doppler) 0 ASSESSMENTS - Ostomy and/or Continence Assessment and Care []  - Incontinence Assessment and Management 0 []  - Ostomy Care Assessment and Management (repouching, etc.) 0 PROCESS - Coordination of Care X - Simple Patient / Family Education for ongoing care 1 15 []  - Complex (extensive) Patient / Family Education for ongoing care 0 []  - Staff obtains Programmer, systems, Records, Test Results / Process Orders 0 []  - Staff telephones HHA, Nursing Homes / Clarify orders / etc 0 []  - Routine Transfer to another Facility (non-emergent condition) 0 Roh, Pancho W. ([id number]) []  - Routine Hospital Admission (non-emergent condition) 0 []  - New Admissions / Biomedical engineer / Ordering NPWT, Apligraf, etc. 0 []  - Emergency Hospital Admission (emergent condition) 0 X - Simple Discharge Coordination 1 10 []  - Complex (extensive) Discharge Coordination 0 PROCESS - Special Needs []  - Pediatric / Minor Patient Management 0 []  - Isolation Patient Management 0 []  - Hearing / Language / Visual special needs 0 []  - Assessment of Community assistance (transportation, D/C planning, etc.) 0 []  - Additional assistance / Altered mentation 0 []  - Support Surface(s) Assessment (bed, cushion, seat, etc.) 0 INTERVENTIONS - Wound Cleansing / Measurement X - Simple Wound  Cleansing - one wound 1 5 []  - Complex Wound Cleansing - multiple wounds 0 X - Wound Imaging (photographs - any number of wounds) 1 5 []  -  Wound Tracing (instead of photographs) 0 X - Simple Wound Measurement - one wound 1 5 []  - Complex Wound Measurement - multiple wounds 0 INTERVENTIONS - Wound Dressings []  - Small Wound Dressing one or multiple wounds 0 []  - Medium Wound Dressing one or multiple wounds 0 []  - Large Wound Dressing one or multiple wounds 0 []  - Application of Medications - topical 0 []  - Application of Medications - injection 0 INTERVENTIONS - Miscellaneous []  - External ear exam 0 Duhamel, Harold W. ([id number]) []  - Specimen Collection (cultures, biopsies, blood, body fluids, etc.) 0 []  - Specimen(s) / Culture(s) sent or taken to Lab for analysis 0 []  - Patient Transfer (multiple staff / Harrel Lemon Lift / Similar devices) 0 []  - Simple Staple / Suture removal (25 or less) 0 []  - Complex Staple / Suture removal (26 or more) 0 []  - Hypo / Hyperglycemic Management (close monitor of Blood Glucose) 0 []  - Ankle / Brachial Index (ABI) - do not check if billed separately 0 X - Vital Signs 1 5 Has the patient been seen at the hospital within the last three years: Yes Total Score: 65 Level Of Care: New/Established - Level 2 Electronic Signature(s) Signed: 07/06/2016 4:10:27 PM By: Montey Hora Entered By: Montey Hora on 07/06/2016 12:54:50 Mitchell [name] ([id number]) -------------------------------------------------------------------------------- Encounter Discharge Information Details Patient Name: Mitchell [name] Date of Service: 07/06/2016 12:30 PM Medical Record Number: [id number] Patient Account Number: [id number] Date of Birth/Sex: Mar 10, 1935 (82 y.o. Male) Treating RN: Montey Hora Primary Care Lailee Hoelzel: Tedra Senegal Other Clinician: Referring Dilia Alemany: Tedra Senegal Treating Savina Olshefski/Extender: Tito Dine in Treatment: 69 Encounter Discharge  Information Items Discharge Pain Level: 0 Discharge Condition: Stable Ambulatory Status: Walker Discharge Destination: Home Transportation: Private Auto Accompanied By: spouse Schedule Follow-up Appointment: No Medication Reconciliation completed No and provided to Patient/Care Tkai Large: Provided on Clinical Summary of Care: 07/06/2016 Form Type Recipient Paper Patient HD Electronic Signature(s) Signed: 07/06/2016 12:58:31 PM By: Ruthine Dose Entered By: Ruthine Dose on 07/06/2016 12:58:31 Mitchell [name] ([id number]) -------------------------------------------------------------------------------- Multi Wound Chart Details Patient Name: Mitchell [name] Date of Service: 07/06/2016 12:30 PM Medical Record Number: [id number] Patient Account Number: [id number] Date of Birth/Sex: 1934-07-10 (82 y.o. Male) Treating RN: Montey Hora Primary Care Macai Sisneros: Tedra Senegal Other Clinician: Referring Javae Braaten: Tedra Senegal Treating Colton Engdahl/Extender: Ricard Dillon Weeks in Treatment: 26 Vital Signs Height(in): 75 Pulse(bpm): 119 Weight(lbs): 272 Blood Pressure 136/85 (mmHg): Body Mass Index(BMI): 34 Temperature(F): 98.0 Respiratory Rate 18 (breaths/min): Photos: [N/A:N/A] Wound Location: Midline Back N/A N/A Wounding Event: Surgical Injury N/A N/A Primary Etiology: Open Surgical Wound N/A N/A Date Acquired: 10/24/2015 N/A N/A Weeks of Treatment: 26 N/A N/A Wound Status: Healed - Epithelialized N/A N/A Measurements L x W x D 0x0x0 N/A N/A (cm) Area (cm) : 0 N/A N/A Volume (cm) : 0 N/A N/A % Reduction in Area: 100.00% N/A N/A % Reduction in Volume: 100.00% N/A N/A Classification: Full Thickness Without N/A N/A Exposed Support Structures Periwound Skin Texture: No Abnormalities Noted N/A N/A Periwound Skin No Abnormalities Noted N/A N/A Moisture: Periwound Skin Color: No Abnormalities Noted N/A N/A Tenderness on No N/A N/A Palpation: Treatment Notes SATCHEL, HEIDINGER ([id number]) Electronic Signature(s) Signed: 07/06/2016 4:28:03 PM By: Linton Ham MD Entered By: Linton Ham on 07/06/2016 13:24:48 Mitchell [name] ([id number]) -------------------------------------------------------------------------------- Multi-Disciplinary Care Plan Details Patient Name: Mitchell [name] Date of Service: 07/06/2016 12:30 PM Medical Record Number: [id number] Patient Account Number: [id number] Date of Birth/Sex: [birth year] (82 y.o. Male) Treating  RN: Montey Hora Primary Care Reiley Keisler: Tedra Senegal Other Clinician: Referring Eboney Claybrook: Tedra Senegal Treating Leonte Horrigan/Extender: Tito Dine in Treatment: 40 Active Inactive Electronic Signature(s) Signed: 07/06/2016 4:10:27 PM By: Montey Hora Entered By: Montey Hora on 07/06/2016 12:54:06 Mitchell [name] ([id number]) -------------------------------------------------------------------------------- Pain Assessment Details Patient Name: Mitchell [name] Date of Service: 07/06/2016 12:30 PM Medical Record Number: [id number] Patient Account Number: [id number] Date of Birth/Sex: 1934/04/24 (82 y.o. Male) Treating RN: Montey Hora Primary Care Inger Wiest: Tedra Senegal Other Clinician: Referring Nasiah Polinsky: Tedra Senegal Treating Zeeshan Korte/Extender: Tito Dine in Treatment: 26 Active Problems Location of Pain Severity and Description of Pain Patient Has Paino No Site Locations Pain Management and Medication Current Pain Management: Notes Topical or injectable lidocaine is offered to patient for acute pain when surgical debridement is performed. If needed, Patient is instructed to use over the counter pain medication for the following 24-48 hours after debridement. Wound care MDs do not prescribed pain medications. Patient has chronic pain or uncontrolled pain. Patient has been instructed to make an appointment with their Primary Care Physician for  pain management. Electronic Signature(s) Signed: 07/06/2016 4:10:27 PM By: Montey Hora Entered By: Montey Hora on 07/06/2016 12:40:49 Mitchell [name] ([id number]) -------------------------------------------------------------------------------- Patient/Caregiver Education Details Patient Name: Mitchell [name] Date of Service: 07/06/2016 12:30 PM Medical Record Number: [id number] Patient Account Number: [id number] Date of Birth/Gender: February 07, 1935 (82 y.o. Male) Treating RN: Montey Hora Primary Care Physician: Tedra Senegal Other Clinician: Referring Physician: Tedra Senegal Treating Physician/Extender: Tito Dine in Treatment: 30 Education Assessment Education Provided To: Patient Education Topics Provided Basic Hygiene: Handouts: Other: skin care of newly healed ulcer site Methods: Explain/Verbal Responses: State content correctly Electronic Signature(s) Signed: 07/06/2016 4:10:27 PM By: Montey Hora Entered By: Montey Hora on 07/06/2016 12:53:33 Wilbon, Emiliano Dyer ([id number]) -------------------------------------------------------------------------------- Wound Assessment Details Patient Name: Mitchell [name] Date of Service: 07/06/2016 12:30 PM Medical Record Number: [id number] Patient Account Number: [id number] Date of Birth/Sex: Sep 24, 1934 (82 y.o. Male) Treating RN: Montey Hora Primary Care Theodus Ran: Tedra Senegal Other Clinician: Referring Chauncey Bruno: Tedra Senegal Treating [place] Perra/Extender: Ricard Dillon Weeks in Treatment: 26 Wound Status Wound Number: 1 Primary Etiology: Open Surgical Wound Wound Location: Midline Back Wound Status: Healed - Epithelialized Wounding Event: Surgical Injury Date Acquired: 10/24/2015 Weeks Of Treatment: 26 Clustered Wound: No Photos Photo Uploaded By: Montey Hora on 07/06/2016 12:59:40 Wound Measurements Length: (cm) 0 % Reduct Width: (cm) 0 % Reduct Depth: (cm) 0 Area: (cm) 0 Volume: (cm)  0 ion in Area: 100% ion in Volume: 100% Wound Description Full Thickness Without Exposed Classification: Support Structures Periwound Skin Texture Texture Color No Abnormalities Noted: No No Abnormalities Noted: No Moisture No Abnormalities Noted: No Electronic Signature(s) Signed: 07/06/2016 4:10:27 PM By: Maryclare Bean, Emiliano Dyer ([id number]) Entered By: Montey Hora on 07/06/2016 12:53:49 Mitchell [name] ([id number]) -------------------------------------------------------------------------------- Vitals Details Patient Name: Mitchell [name] Date of Service: 07/06/2016 12:30 PM Medical Record Number: [id number] Patient Account Number: [id number] Date of Birth/Sex: 07-21-1934 (82 y.o. Male) Treating RN: Montey Hora Primary Care Texanna Hilburn: Tedra Senegal Other Clinician: Referring Aaleyah Witherow: Tedra Senegal Treating Lucus Lambertson/Extender: Ricard Dillon Weeks in Treatment: 26 Vital Signs Time Taken: 12:45 Temperature (F): 98.0 Height (in): 75 Pulse (bpm): 119 Weight (lbs): 272 Respiratory Rate (breaths/min): 18 Body Mass Index (BMI): 34 Blood Pressure (mmHg): 136/85 Reference Range: 80 - 120 mg / dl Electronic Signature(s) Signed: 07/06/2016 4:10:27 PM By: Montey Hora Entered By: Montey Hora on 07/06/2016 12:45:40

## 2016-07-12 DIAGNOSIS — M5116 Intervertebral disc disorders with radiculopathy, lumbar region: Secondary | ICD-10-CM | POA: Diagnosis not present

## 2016-07-12 DIAGNOSIS — M545 Low back pain: Secondary | ICD-10-CM | POA: Diagnosis not present

## 2016-07-15 DIAGNOSIS — M545 Low back pain: Secondary | ICD-10-CM | POA: Diagnosis not present

## 2016-07-15 DIAGNOSIS — M5116 Intervertebral disc disorders with radiculopathy, lumbar region: Secondary | ICD-10-CM | POA: Diagnosis not present

## 2016-07-20 DIAGNOSIS — M5116 Intervertebral disc disorders with radiculopathy, lumbar region: Secondary | ICD-10-CM | POA: Diagnosis not present

## 2016-07-20 DIAGNOSIS — M545 Low back pain: Secondary | ICD-10-CM | POA: Diagnosis not present

## 2016-07-27 DIAGNOSIS — M5116 Intervertebral disc disorders with radiculopathy, lumbar region: Secondary | ICD-10-CM | POA: Diagnosis not present

## 2016-07-27 DIAGNOSIS — M545 Low back pain: Secondary | ICD-10-CM | POA: Diagnosis not present

## 2016-07-30 DIAGNOSIS — M5116 Intervertebral disc disorders with radiculopathy, lumbar region: Secondary | ICD-10-CM | POA: Diagnosis not present

## 2016-07-30 DIAGNOSIS — M545 Low back pain: Secondary | ICD-10-CM | POA: Diagnosis not present

## 2016-08-03 DIAGNOSIS — M545 Low back pain: Secondary | ICD-10-CM | POA: Diagnosis not present

## 2016-08-03 DIAGNOSIS — M5116 Intervertebral disc disorders with radiculopathy, lumbar region: Secondary | ICD-10-CM | POA: Diagnosis not present

## 2016-08-06 DIAGNOSIS — M545 Low back pain: Secondary | ICD-10-CM | POA: Diagnosis not present

## 2016-08-06 DIAGNOSIS — M5116 Intervertebral disc disorders with radiculopathy, lumbar region: Secondary | ICD-10-CM | POA: Diagnosis not present

## 2016-08-09 DIAGNOSIS — M545 Low back pain: Secondary | ICD-10-CM | POA: Diagnosis not present

## 2016-08-09 DIAGNOSIS — M5116 Intervertebral disc disorders with radiculopathy, lumbar region: Secondary | ICD-10-CM | POA: Diagnosis not present

## 2016-08-10 DIAGNOSIS — M545 Low back pain: Secondary | ICD-10-CM | POA: Diagnosis not present

## 2016-08-10 DIAGNOSIS — M5116 Intervertebral disc disorders with radiculopathy, lumbar region: Secondary | ICD-10-CM | POA: Diagnosis not present

## 2016-08-13 DIAGNOSIS — M5116 Intervertebral disc disorders with radiculopathy, lumbar region: Secondary | ICD-10-CM | POA: Diagnosis not present

## 2016-08-13 DIAGNOSIS — M545 Low back pain: Secondary | ICD-10-CM | POA: Diagnosis not present

## 2016-08-17 DIAGNOSIS — M545 Low back pain: Secondary | ICD-10-CM | POA: Diagnosis not present

## 2016-08-17 DIAGNOSIS — M5116 Intervertebral disc disorders with radiculopathy, lumbar region: Secondary | ICD-10-CM | POA: Diagnosis not present

## 2016-08-19 ENCOUNTER — Other Ambulatory Visit: Payer: Medicare Other | Admitting: Internal Medicine

## 2016-08-20 ENCOUNTER — Other Ambulatory Visit: Payer: Medicare Other | Admitting: Internal Medicine

## 2016-08-20 ENCOUNTER — Other Ambulatory Visit: Payer: Self-pay | Admitting: Internal Medicine

## 2016-08-20 DIAGNOSIS — E785 Hyperlipidemia, unspecified: Secondary | ICD-10-CM | POA: Diagnosis not present

## 2016-08-20 DIAGNOSIS — Z0000 Encounter for general adult medical examination without abnormal findings: Secondary | ICD-10-CM | POA: Diagnosis not present

## 2016-08-20 DIAGNOSIS — Z13220 Encounter for screening for lipoid disorders: Secondary | ICD-10-CM | POA: Diagnosis not present

## 2016-08-20 DIAGNOSIS — Z125 Encounter for screening for malignant neoplasm of prostate: Secondary | ICD-10-CM

## 2016-08-20 DIAGNOSIS — R413 Other amnesia: Secondary | ICD-10-CM | POA: Diagnosis not present

## 2016-08-20 DIAGNOSIS — R5383 Other fatigue: Secondary | ICD-10-CM | POA: Diagnosis not present

## 2016-08-20 DIAGNOSIS — R531 Weakness: Secondary | ICD-10-CM | POA: Diagnosis not present

## 2016-08-20 LAB — CBC WITH DIFFERENTIAL/PLATELET
Basophils Absolute: 0 cells/uL (ref 0–200)
Basophils Relative: 0 %
Eosinophils Absolute: 176 cells/uL (ref 15–500)
Eosinophils Relative: 2 %
HCT: 45.7 % (ref 38.5–50.0)
Hemoglobin: 14.8 g/dL (ref 13.2–17.1)
Lymphocytes Relative: 27 %
Lymphs Abs: 2376 cells/uL (ref 850–3900)
MCH: 30.1 pg (ref 27.0–33.0)
MCHC: 32.4 g/dL (ref 32.0–36.0)
MCV: 92.9 fL (ref 80.0–100.0)
MPV: 9.6 fL (ref 7.5–12.5)
Monocytes Absolute: 792 cells/uL (ref 200–950)
Monocytes Relative: 9 %
Neutro Abs: 5456 cells/uL (ref 1500–7800)
Neutrophils Relative %: 62 %
Platelets: 239 10*3/uL (ref 140–400)
RBC: 4.92 MIL/uL (ref 4.20–5.80)
RDW: 15.2 % — ABNORMAL HIGH (ref 11.0–15.0)
WBC: 8.8 10*3/uL (ref 3.8–10.8)

## 2016-08-20 LAB — PSA: PSA: 0.6 ng/mL (ref <=4.0)

## 2016-08-21 LAB — COMPREHENSIVE METABOLIC PANEL
ALT: 11 U/L (ref 9–46)
AST: 16 U/L (ref 10–35)
Albumin: 4 g/dL (ref 3.6–5.1)
Alkaline Phosphatase: 118 U/L — ABNORMAL HIGH (ref 40–115)
BUN: 18 mg/dL (ref 7–25)
CO2: 22 mmol/L (ref 20–31)
Calcium: 9 mg/dL (ref 8.6–10.3)
Chloride: 104 mmol/L (ref 98–110)
Creat: 0.7 mg/dL (ref 0.70–1.18)
Glucose, Bld: 97 mg/dL (ref 65–99)
Potassium: 4.8 mmol/L (ref 3.5–5.3)
Sodium: 140 mmol/L (ref 135–146)
Total Bilirubin: 0.5 mg/dL (ref 0.2–1.2)
Total Protein: 6.4 g/dL (ref 6.1–8.1)

## 2016-08-21 LAB — LIPID PANEL
Cholesterol: 169 mg/dL (ref <200)
HDL: 45 mg/dL (ref >40)
LDL Cholesterol: 107 mg/dL — ABNORMAL HIGH (ref <100)
Total CHOL/HDL Ratio: 3.8 Ratio (ref <5.0)
Triglycerides: 87 mg/dL (ref <150)
VLDL: 17 mg/dL (ref <30)

## 2016-08-23 ENCOUNTER — Ambulatory Visit (INDEPENDENT_AMBULATORY_CARE_PROVIDER_SITE_OTHER): Payer: Medicare Other | Admitting: Internal Medicine

## 2016-08-23 ENCOUNTER — Encounter: Payer: Self-pay | Admitting: Internal Medicine

## 2016-08-23 VITALS — BP 140/80 | HR 77 | Temp 98.0°F | Wt 275.0 lb

## 2016-08-23 DIAGNOSIS — E221 Hyperprolactinemia: Secondary | ICD-10-CM | POA: Diagnosis not present

## 2016-08-23 DIAGNOSIS — Z87440 Personal history of urinary (tract) infections: Secondary | ICD-10-CM | POA: Diagnosis not present

## 2016-08-23 DIAGNOSIS — Z8709 Personal history of other diseases of the respiratory system: Secondary | ICD-10-CM

## 2016-08-23 DIAGNOSIS — Z8679 Personal history of other diseases of the circulatory system: Secondary | ICD-10-CM

## 2016-08-23 DIAGNOSIS — E7849 Other hyperlipidemia: Secondary | ICD-10-CM

## 2016-08-23 DIAGNOSIS — R296 Repeated falls: Secondary | ICD-10-CM

## 2016-08-23 DIAGNOSIS — I519 Heart disease, unspecified: Secondary | ICD-10-CM | POA: Diagnosis not present

## 2016-08-23 DIAGNOSIS — I10 Essential (primary) hypertension: Secondary | ICD-10-CM | POA: Diagnosis not present

## 2016-08-23 DIAGNOSIS — Z9861 Coronary angioplasty status: Secondary | ICD-10-CM | POA: Diagnosis not present

## 2016-08-23 DIAGNOSIS — Z6834 Body mass index (BMI) 34.0-34.9, adult: Secondary | ICD-10-CM

## 2016-08-23 DIAGNOSIS — Z0000 Encounter for general adult medical examination without abnormal findings: Secondary | ICD-10-CM | POA: Diagnosis not present

## 2016-08-23 DIAGNOSIS — F321 Major depressive disorder, single episode, moderate: Secondary | ICD-10-CM

## 2016-08-23 DIAGNOSIS — N400 Enlarged prostate without lower urinary tract symptoms: Secondary | ICD-10-CM

## 2016-08-23 DIAGNOSIS — Z955 Presence of coronary angioplasty implant and graft: Secondary | ICD-10-CM

## 2016-08-23 DIAGNOSIS — D126 Benign neoplasm of colon, unspecified: Secondary | ICD-10-CM | POA: Diagnosis not present

## 2016-08-23 DIAGNOSIS — M21371 Foot drop, right foot: Secondary | ICD-10-CM

## 2016-08-23 DIAGNOSIS — R413 Other amnesia: Secondary | ICD-10-CM

## 2016-08-23 DIAGNOSIS — R609 Edema, unspecified: Secondary | ICD-10-CM | POA: Diagnosis not present

## 2016-08-23 DIAGNOSIS — G4733 Obstructive sleep apnea (adult) (pediatric): Secondary | ICD-10-CM | POA: Diagnosis not present

## 2016-08-23 DIAGNOSIS — E784 Other hyperlipidemia: Secondary | ICD-10-CM | POA: Diagnosis not present

## 2016-08-23 LAB — POCT URINALYSIS DIPSTICK
Bilirubin, UA: NEGATIVE
Blood, UA: NEGATIVE
Glucose, UA: NEGATIVE
Ketones, UA: NEGATIVE
Leukocytes, UA: NEGATIVE
Nitrite, UA: NEGATIVE
Spec Grav, UA: 1.01 (ref 1.010–1.025)
Urobilinogen, UA: 0.2 E.U./dL
pH, UA: 7.5 (ref 5.0–8.0)

## 2016-08-23 LAB — FOLATE: Folate: 12 ng/mL (ref >5.4)

## 2016-08-23 LAB — TSH: TSH: 1.21 mIU/L (ref 0.40–4.50)

## 2016-08-23 LAB — VITAMIN B12: Vitamin B-12: 253 pg/mL (ref 200–1100)

## 2016-08-23 NOTE — Progress Notes (Signed)
Subjective:    Patient ID: Mitchell [name], male    DOB: 1935-04-03, 82 y.o.   MRN: [id number]  HPI 82 year old White Male with multiple medical problems for annual wellness exam and evaluation of medical issues.  History of morbid obesity, hypertension, right coronary stent placement, sleep apnea, history of atrial flutter, history of melanoma, chronic musculoskeletal pain status post 2 hip and knee replacements, adenomatous colon polyp, history of asthma, pituitary microadenoma with hyperprolactinemia, erectile dysfunction, BPH.  Weight in February 2016 was 270 pounds, in 2015 was 272 pounds, in 2017 weight was 264 pounds. Weight is  275 pounds.  He goes to physical therapy in [facility] physical therapy. He has a right foot drop which he's had for a number of years. He wears a brace on that foot. He's had multiple falls.  On 09/17/2015 he suffered a right malleolar fracture when he fell outside this office trying to get out of the car and get onto a scooter.  In July he had L4-L5 L5-S1 redo decompression fusion L2-L3 laminectomy with right microdiscectomy, L2-S1 instrumentation by Dr. Vertell Limber. Status post surgery he had a wound VAC placed in the superior aspect of the surgical incision. Culture grew both Klebsiella and Pseudomonas. He was treated with ciprofloxacin. For sometime thereafter, the superior aspect of the surgical incision was an open wound that had a track that tunneled downward and to the left for several centimeters and was packed with iodoform gauze for a number of months. He  was seen a number of times at the wound care center in [facility]. The wound has finally closed.  Unfortunately has gained 11 pounds since last year.  He got somewhat depressed with recovering from this surgery. He also has had some memory disturbance since the surgery. We're going to check B-12 and folate levels. We'll make sure he has a neurological evaluation.  Last colonoscopy done March 2011  and an adenomatous colon polyp was found. Colonoscopy was done by Dr. Earle Gell.  History of angioplasty September 2001 with bare metal stent being placed in the right coronary artery.  History of melanoma left neck September 2000. History of herpes zoster left trunk March 2000.  Sees Dr. Soyla Murphy for pituitary microadenoma with prolactinemia and hypogonadism.  Dr. Laurance Flatten in [place] did his hip and knee arthroplasties. Dr. Mindi Curling dermatologist. Dr. Herbert Deaner is his ophthalmologist. Dr. Quay Burow is his cardiologist. Dr. Shirlyn Goltz sees him for urology issues. It was anticipated he was going to need urology surgery for recurrent urinary infections which involved urethroplasty due to a stricture. However this was postponed due to his back issues and subsequent surgery. Since that time, he's not had one single urinary tract infection.  Had appendectomy 1958, right carpal tunnel release 1991, decompressive laminectomy L3-L4 and L4-L5 for spinal stenosis September 1995. Umbilical hernia repair 5009. Left lower lobe pneumonia in 1996. Right DVT with ruptured Baker's cyst requiring hospitalization February 1993.  History of intermittent hematuria which occurred prior to being on anticoagulation therapy.  History of recurrent urinary infections from time to time which occur fairly suddenly. However has not had one over the past year.  He is intolerant of statins and is currently on Zetia.  Social history: Married with 2 adult children. Daughter is a Animal nutritionist. Does not smoke. He is a self-employed Office manager. Does not consume alcohol.  For while, he was on chronic anticoagulation for his heart issues but he chose to stop it.  He does have falls from time to  time related to impaired gait and balance.  Family history: Both son and daughter have hypertension. Son has hyperlipidemia. Father died at age 53 of an MI. Mother died at age 48 of "old age". One sister in good  health.       Review of Systems  Constitutional: Positive for fatigue.  Respiratory: Negative.   Cardiovascular: Negative.   Gastrointestinal: Negative.   Genitourinary: Negative.   Psychiatric/Behavioral:       During issues with back problems and subsequent surgery last year he was placed on Cymbalta for pain and depression. Depression has improved.  Wife says some days his memory is very good and other days he can recall what he did the previous day. This is unusual for him. His mind has always been very sharp  until post lumbar surgery in the summer of 2017.       Objective:   Physical Exam  Constitutional: He is oriented to person, place, and time. He appears well-developed and well-nourished.  HENT:  Head: Normocephalic.  Right Ear: External ear normal.  Left Ear: External ear normal.  Mouth/Throat: Oropharynx is clear and moist. No oropharyngeal exudate.  Eyes: Conjunctivae and EOM are normal. Pupils are equal, round, and reactive to light. Right eye exhibits no discharge. Left eye exhibits no discharge.  Neck: Neck supple. No JVD present. No thyromegaly present.  Cardiovascular: Normal rate and regular rhythm.   Pulmonary/Chest: Effort normal and breath sounds normal. No respiratory distress. He has no wheezes.  Abdominal: Soft. Bowel sounds are normal. He exhibits no distension and no mass. There is no tenderness. There is no rebound and no guarding.  Musculoskeletal:  Trace lower extremity edema. Right foot drop  Neurological: He is alert and oriented to person, place, and time. He has normal reflexes. No cranial nerve deficit.  Skin: Skin is warm and dry.  Psychiatric: He has a normal mood and affect. His behavior is normal. Judgment and thought content normal.  Vitals reviewed.         Assessment & Plan:  Status post redo of lumbar surgery multilevel-see previous notes by Dr. Vertell Limber  Fractured left fibula secondary to fall 2017  History of urethral  stricture but no recent urinary tract infections  Chronic low back pain treated with tramadol  History of coronary artery disease followed with Dr. Alvester Chou  Obstructive sleep apnea  Morbid obesity  History of asthma  Pituitary microadenoma with hyperprolactinemia  History of erectile dysfunction  BPH  Essential hypertension arrest statin intolerance with hyperlipidemia treated with Zetia  History of melanoma  History of atrial flutter  History of recurrent urinary infections  Plan: B-12 and folate levels were checked. B-12 was low normal at 253. Recommended oral B-12 and follow-up in several weeks. TSH normal. Folate was normal. PSA within normal limits.  Wife is concerned about his memory issues. She is wondering if he needs further evaluation. We'll refer to neurologist. Routine follow-up of these medical issues will be done in 6 months but he'll have a B-12 level checked in few weeks.  Subjective:   Patient presents for Medicare Annual/Subsequent preventive examination.  Review Past Medical/Family/Social:   Risk Factors  Current exercise habits:  Dietary issues discussed:   Cardiac risk factors:  Depression Screen  (Note: if answer to either of the following is "Yes", a more complete depression screening is indicated)   Over the past two weeks, have you felt down, depressed or hopeless? Yes since back surgery Over the past two weeks, have  you felt little interest or pleasure in doing things? Yes since back surgery Have you lost interest or pleasure in daily life? No Do you often feel hopeless? Yes Do you cry easily over simple problems? No   Activities of Daily Living  In your present state of health, do you have any difficulty performing the following activities?:   Driving? Has not been driving since surgery Managing money? No  Feeding yourself? No  Getting from bed to chair? Sometimes Climbing a flight of stairs? Yes Preparing food and eating?: No  trouble eating. Wife prepares food Bathing or showering? Yes Getting dressed: Yes Getting to the toilet? Yes Using the toilet: Yes Moving around from place to place: Yes In the past year have you fallen or had a near fall?:No  Are you sexually active? Not answered  Do you have more than one partner? No Hearing Difficulties: No  Do you often ask people to speak up or repeat themselves? Yes Do you experience ringing or noises in your ears? Yes Do you have difficulty understanding soft or whispered voices? Yes  Do you feel that you have a problem with memory? Yes his wife does Do you often misplace items? Not answered   Home Safety:  Do you have a smoke alarm at your residence? Yes Do you have grab bars in the bathroom? Yes Do you have throw rugs in your house? No   Cognitive Testing =-not done today Alert? Yes Normal Appearance?Yes  Oriented to person? Yes Place? Yes  Time? Yes  Recall of three objects? Yes  Can perform simple calculations? Yes  Displays appropriate judgment?Yes  Can read the correct time from a watch face?Yes   List the Names of Other Physician/Practitioners you currently use:  See referral list for the physicians patient is currently seeing.     Review of Systems: See above   Objective:     General appearance: Appears stated age and Morbidly obese  Head: Normocephalic, without obvious abnormality, atraumatic  Eyes: conj clear, EOMi PEERLA  Ears: normal TM's and external ear canals both ears  Nose: Nares normal. Septum midline. Mucosa normal. No drainage or sinus tenderness.  Throat: lips, mucosa, and tongue normal; teeth and gums normal  Neck: no adenopathy, no carotid bruit, no JVD, supple, symmetrical, trachea midline and thyroid not enlarged, symmetric, no tenderness/mass/nodules  No CVA tenderness.  Lungs: clear to auscultation bilaterally  Breasts: normal appearance, no masses or tenderness,  Heart: regular rate and rhythm, S1, S2 normal, no  murmur, click, rub or gallop  Abdomen: soft, non-tender; bowel sounds normal; no masses, no organomegaly  Musculoskeletal: ROM normal in all joints, no crepitus, no deformity, Normal muscle strengthen. Back  is symmetric, no curvature. Skin: Skin color, texture, turgor normal. No rashes or lesions  Lymph nodes: Cervical, supraclavicular, and axillary nodes normal.  Neurologic: CN 2 -12 Normal, Normal symmetric reflexes. Normal coordination and gait  Psych: Alert & Oriented x 3, Mood appear stable.    Assessment:    Annual wellness medicare exam   Plan:    During the course of the visit the patient was educated and counseled about appropriate screening and preventive services including:   Annual flu vaccine  Annual PSA     Patient Instructions (the written plan) was given to the patient.  Medicare Attestation  I have personally reviewed:  The patient's medical and social history  Their use of alcohol, tobacco or illicit drugs  Their current medications and supplements  The patient's functional  ability including ADLs,fall risks, home safety risks, cognitive, and hearing and visual impairment  Diet and physical activities  Evidence for depression or mood disorders  The patient's weight, height, BMI, and visual acuity have been recorded in the chart. I have made referrals, counseling, and provided education to the patient based on review of the above and I have provided the patient with a written personalized care plan for preventive services.

## 2016-08-24 DIAGNOSIS — M25372 Other instability, left ankle: Secondary | ICD-10-CM | POA: Diagnosis not present

## 2016-08-24 DIAGNOSIS — M545 Low back pain: Secondary | ICD-10-CM | POA: Diagnosis not present

## 2016-08-24 DIAGNOSIS — M21371 Foot drop, right foot: Secondary | ICD-10-CM | POA: Diagnosis not present

## 2016-08-24 DIAGNOSIS — M5116 Intervertebral disc disorders with radiculopathy, lumbar region: Secondary | ICD-10-CM | POA: Diagnosis not present

## 2016-08-26 DIAGNOSIS — M5116 Intervertebral disc disorders with radiculopathy, lumbar region: Secondary | ICD-10-CM | POA: Diagnosis not present

## 2016-08-26 DIAGNOSIS — M545 Low back pain: Secondary | ICD-10-CM | POA: Diagnosis not present

## 2016-08-26 DIAGNOSIS — M21371 Foot drop, right foot: Secondary | ICD-10-CM | POA: Diagnosis not present

## 2016-08-26 DIAGNOSIS — M25372 Other instability, left ankle: Secondary | ICD-10-CM | POA: Diagnosis not present

## 2016-08-31 ENCOUNTER — Other Ambulatory Visit: Payer: Self-pay | Admitting: Internal Medicine

## 2016-08-31 DIAGNOSIS — M545 Low back pain: Secondary | ICD-10-CM | POA: Diagnosis not present

## 2016-08-31 DIAGNOSIS — M25372 Other instability, left ankle: Secondary | ICD-10-CM | POA: Diagnosis not present

## 2016-08-31 DIAGNOSIS — M5116 Intervertebral disc disorders with radiculopathy, lumbar region: Secondary | ICD-10-CM | POA: Diagnosis not present

## 2016-08-31 DIAGNOSIS — M21371 Foot drop, right foot: Secondary | ICD-10-CM | POA: Diagnosis not present

## 2016-09-03 DIAGNOSIS — M5116 Intervertebral disc disorders with radiculopathy, lumbar region: Secondary | ICD-10-CM | POA: Diagnosis not present

## 2016-09-03 DIAGNOSIS — M545 Low back pain: Secondary | ICD-10-CM | POA: Diagnosis not present

## 2016-09-03 DIAGNOSIS — M25372 Other instability, left ankle: Secondary | ICD-10-CM | POA: Diagnosis not present

## 2016-09-03 DIAGNOSIS — M21371 Foot drop, right foot: Secondary | ICD-10-CM | POA: Diagnosis not present

## 2016-09-16 NOTE — Addendum Note (Signed)
Addended by: Drucilla Schmidt on: 09/16/2016 08:44 AM   Modules accepted: Orders

## 2016-09-20 ENCOUNTER — Other Ambulatory Visit: Payer: Self-pay | Admitting: Internal Medicine

## 2016-09-21 DIAGNOSIS — M25372 Other instability, left ankle: Secondary | ICD-10-CM | POA: Diagnosis not present

## 2016-09-21 DIAGNOSIS — R2689 Other abnormalities of gait and mobility: Secondary | ICD-10-CM | POA: Diagnosis not present

## 2016-09-21 DIAGNOSIS — M4716 Other spondylosis with myelopathy, lumbar region: Secondary | ICD-10-CM | POA: Diagnosis not present

## 2016-09-21 DIAGNOSIS — M5136 Other intervertebral disc degeneration, lumbar region: Secondary | ICD-10-CM | POA: Diagnosis not present

## 2016-09-23 ENCOUNTER — Other Ambulatory Visit: Payer: Medicare Other | Admitting: Internal Medicine

## 2016-09-23 ENCOUNTER — Ambulatory Visit (INDEPENDENT_AMBULATORY_CARE_PROVIDER_SITE_OTHER): Payer: Self-pay

## 2016-09-23 ENCOUNTER — Encounter (INDEPENDENT_AMBULATORY_CARE_PROVIDER_SITE_OTHER): Payer: Self-pay | Admitting: Orthopaedic Surgery

## 2016-09-23 ENCOUNTER — Ambulatory Visit (INDEPENDENT_AMBULATORY_CARE_PROVIDER_SITE_OTHER): Payer: Medicare Other | Admitting: Orthopaedic Surgery

## 2016-09-23 DIAGNOSIS — M25572 Pain in left ankle and joints of left foot: Secondary | ICD-10-CM

## 2016-09-23 DIAGNOSIS — E538 Deficiency of other specified B group vitamins: Secondary | ICD-10-CM

## 2016-09-23 DIAGNOSIS — G8929 Other chronic pain: Secondary | ICD-10-CM

## 2016-09-23 DIAGNOSIS — M25571 Pain in right ankle and joints of right foot: Secondary | ICD-10-CM

## 2016-09-23 NOTE — Progress Notes (Signed)
Office Visit Note   Patient: Mitchell [name]           Date of Birth: Feb 22, 1935           MRN: [id number] Visit Date: 09/23/2016              Requested by: Elby Showers, MD [address] [place], [place] [address] PCP: Elby Showers, MD   Assessment & Plan: Visit Diagnoses:  1. Chronic pain of both ankles     Plan: Overall impression is bilateral ankle osteoarthritis. I did evaluate the orthotics that he has an think they're appropriate. Options encouraged and answered. Follow-up with me as needed.  Follow-Up Instructions: Return if symptoms worsen or fail to improve.   Orders:  Orders Placed This Encounter  Procedures  . XR Ankle Complete Right  . XR Ankle Complete Left   No orders of the defined types were placed in this encounter.     Procedures: No procedures performed   Clinical Data: No additional findings.   Subjective: Chief Complaint  Patient presents with  . Right Ankle - Pain  . Left Ankle - Pain    Patient is a 82 year old gentleman comes in with bilateral ankle pain worse on the left. He is had multiple episodes of his left ankle rolling over. He is working with physical therapy. He had back surgery and spent 6 weeks in the hospital rehabbing. He does have a previous right ankle fracture which did heal    Review of Systems  Constitutional: Negative.   All other systems reviewed and are negative.    Objective: Vital Signs: There were no vitals taken for this visit.  Physical Exam  Constitutional: He is oriented to person, place, and time. He appears well-developed and well-nourished.  Pulmonary/Chest: Effort normal.  Abdominal: Soft.  Neurological: He is alert and oriented to person, place, and time.  Skin: Skin is warm.  Psychiatric: He has a normal mood and affect. His behavior is normal. Judgment and thought content normal.  Nursing note and vitals reviewed.   Ortho Exam Bilateral ankle exam is essentially benign. He has  no pain with palpation. He has good range of motion. No cellulitis or swelling. Specialty Comments:  No specialty comments available.  Imaging: Xr Ankle Complete Left  Result Date: 09/23/2016 No acute or structural abnormalities. Degenerative joint disease  Xr Ankle Complete Right  Result Date: 09/23/2016 No acute or structural abnormalities. Healed bimalleolar ankle fracture    PMFS History: Patient Active Problem List   Diagnosis Date Noted  . Paroxysmal supraventricular tachycardia ([place]) 03/25/2016  . Subacute confusional state 11/24/2015  . History of right foot drop 11/21/2015  . Labile blood pressure   . Wound drainage   . Nocturnal enuresis   . Fracture   . Confusion   . Sleep disturbance   . Agitation   . Surgical wound infection   . Debility   . Postoperative confusion   . Surgical wound dehiscence   . Hyponatremia   . Leukocytosis   . Acute blood loss anemia   . Abnormality of gait   . Intervertebral disc disorder with radiculopathy of lumbar region 10/29/2015  . Benign essential HTN   . Adjustment disorder with mixed anxiety and depressed mood   . Gastroesophageal reflux disease without esophagitis   . Cough variant asthma   . Chronic back pain   . Surgery, other elective   . Coronary artery disease involving native coronary artery of native heart without  angina pectoris   . Atrial flutter ([place])   . Morbid obesity due to excess calories ([place])   . Ureteral stricture   . Right foot drop   . Ankle fracture, right   . Osteoarthritis of spine with radiculopathy, lumbosacral region   . Rotator cuff tear   . Primary osteoarthritis of right shoulder   . Post-operative pain   . Cognitive deficits   . Lumbar stenosis with neurogenic claudication 10/24/2015  . Status post laminectomy with spinal fusion 10/24/2015  . Atrial flutter with RVR- s/p TEE CV Feb 2013 05/25/2011  . Dyspnea, unspecified 05/25/2011  . Anticoagulant causing adverse effect in therapeutic  use 05/25/2011  . LV dysfunction, EF 45% in AF- >55% May 2013 in NSR 05/25/2011  . CHF, acute, mild secondary to EF 40-45% an a. flutter with RVR 05/25/2011  . Erectile dysfunction 05/16/2011  . Low testosterone 05/16/2011  . BPH (benign prostatic hyperplasia) 05/16/2011  . History of recurrent urinary tract infection 05/16/2011  . Spinal stenosis 05/16/2011  . HTN (hypertension), 09/17/2010  . Hyperlipidemia 09/17/2010  . CAD- BMS to PLA 2001, 70% mid LAD, 50-60% prox. LCX, 60% PDA- Low risk Myoview Jan 2013 09/17/2010  . Morbid obesity ([place]) 09/17/2010  . Sleep apnea, intolerant to cpap 09/17/2010  . Pituitary microadenoma with hyperprolactinemia ([place]) 09/17/2010  . Asthma 09/17/2010  . Osteoarthritis 09/17/2010  . History of melanoma in situ 09/17/2010  . Hx of adenomatous colonic polyps 09/17/2010   Past Medical History:  Diagnosis Date  . Acute on chronic systolic heart failure, usually asymptomatic but with tachycardia HF became acute now resolved 05/26/2011  . Adenomatous polyp   . Ankle fracture    right  . Anxiety   . Arthritis   . Asthma    Symbicort daily as needed  . Atrial flutter with rapid ventricular response ([place]) 05/25/2011  . CHF, acute, mild secondary to EF 40-45% an a. flutter with RVR 05/25/2011   takes Furosemide daily as needed  . Constipation    takes Miralax daily as needed  . Coronary artery disease    2D ECHO, 08/24/2011 - EF >55%; NUCLEAR STRESS TEST, 04/21/2010 - mild ischemia in Basal Inferolateral and Mid Inferolateral regions, post-stress EF 56%,   . Depression    takes Cymbalta daily  . Dysrhythmia    atrial fibrilation  . GERD (gastroesophageal reflux disease)    takes Pantoprazole daily  . History of blood transfusion 53  yrs ago   got hives  . Hyperlipidemia    takes Zetia daily  . Hypertension    takes Amlodipine and Atenolol daily  . LV dysfunction, EF by ECHO 40-45% 04/29/11 05/25/2011  . Melanoma ([place])    l neck  . Pneumonia   .  Prostatitis   . Shortness of breath   . Sleep apnea    no longer has since loss of 70 pounds  . SOB (shortness of breath), secondary to SOB 05/25/2011  . Spinal stenosis     Family History  Problem Relation Age of Onset  . Heart failure Mother 43  . Heart failure Father 72  . Cancer Paternal Grandfather     Past Surgical History:  Procedure Laterality Date  . APPENDECTOMY    . BACK SURGERY    . CARDIAC CATHETERIZATION  03/18/2000   PDA between the first PLA branch stented with a 3x8 Guidant Penta stent resulting in reduction of a 99% stenosis to 0% residual  . CARDIOVERSION  05/26/2011   Procedure:  CARDIOVERSION;  Surgeon: Sanda Klein, MD;  Location: [facility] ENDOSCOPY;  Service: Cardiovascular;  Laterality: N/A;  . CORONARY ANGIOPLASTY    . HERNIA REPAIR     umbilical  . JOINT REPLACEMENT     left knee  . JOINT REPLACEMENT     left hip  . knee replacements Bilateral   . LUMBAR LAMINECTOMY/DECOMPRESSION MICRODISCECTOMY    . ROTATOR CUFF REPAIR Left   . TEE WITHOUT CARDIOVERSION  05/26/2011   Procedure: TRANSESOPHAGEAL ECHOCARDIOGRAM (TEE);  Surgeon: Sanda Klein, MD;  Location: [facility] ENDOSCOPY;  Service: Cardiovascular;  Laterality: N/A;   Social History   Occupational History  . Not on file.   Social History Main Topics  . Smoking status: Never Smoker  . Smokeless tobacco: Former Systems developer    Types: [place] date: 09/16/1972  . Alcohol use No  . Drug use: No  . Sexual activity: Not Currently

## 2016-09-24 DIAGNOSIS — M25372 Other instability, left ankle: Secondary | ICD-10-CM | POA: Diagnosis not present

## 2016-09-24 DIAGNOSIS — M5136 Other intervertebral disc degeneration, lumbar region: Secondary | ICD-10-CM | POA: Diagnosis not present

## 2016-09-24 DIAGNOSIS — R2689 Other abnormalities of gait and mobility: Secondary | ICD-10-CM | POA: Diagnosis not present

## 2016-09-24 DIAGNOSIS — M4716 Other spondylosis with myelopathy, lumbar region: Secondary | ICD-10-CM | POA: Diagnosis not present

## 2016-09-24 LAB — VITAMIN B12: Vitamin B-12: 987 pg/mL (ref 200–1100)

## 2016-09-28 DIAGNOSIS — M25372 Other instability, left ankle: Secondary | ICD-10-CM | POA: Diagnosis not present

## 2016-09-28 DIAGNOSIS — L570 Actinic keratosis: Secondary | ICD-10-CM | POA: Diagnosis not present

## 2016-09-28 DIAGNOSIS — M4716 Other spondylosis with myelopathy, lumbar region: Secondary | ICD-10-CM | POA: Diagnosis not present

## 2016-09-28 DIAGNOSIS — Z85820 Personal history of malignant melanoma of skin: Secondary | ICD-10-CM | POA: Diagnosis not present

## 2016-09-28 DIAGNOSIS — L821 Other seborrheic keratosis: Secondary | ICD-10-CM | POA: Diagnosis not present

## 2016-09-28 DIAGNOSIS — X32XXXD Exposure to sunlight, subsequent encounter: Secondary | ICD-10-CM | POA: Diagnosis not present

## 2016-09-28 DIAGNOSIS — D225 Melanocytic nevi of trunk: Secondary | ICD-10-CM | POA: Diagnosis not present

## 2016-09-28 DIAGNOSIS — Z08 Encounter for follow-up examination after completed treatment for malignant neoplasm: Secondary | ICD-10-CM | POA: Diagnosis not present

## 2016-09-28 DIAGNOSIS — R2689 Other abnormalities of gait and mobility: Secondary | ICD-10-CM | POA: Diagnosis not present

## 2016-09-28 DIAGNOSIS — M5136 Other intervertebral disc degeneration, lumbar region: Secondary | ICD-10-CM | POA: Diagnosis not present

## 2016-09-28 DIAGNOSIS — Z1283 Encounter for screening for malignant neoplasm of skin: Secondary | ICD-10-CM | POA: Diagnosis not present

## 2016-10-01 DIAGNOSIS — M5136 Other intervertebral disc degeneration, lumbar region: Secondary | ICD-10-CM | POA: Diagnosis not present

## 2016-10-01 DIAGNOSIS — M4716 Other spondylosis with myelopathy, lumbar region: Secondary | ICD-10-CM | POA: Diagnosis not present

## 2016-10-01 DIAGNOSIS — R2689 Other abnormalities of gait and mobility: Secondary | ICD-10-CM | POA: Diagnosis not present

## 2016-10-01 DIAGNOSIS — M25372 Other instability, left ankle: Secondary | ICD-10-CM | POA: Diagnosis not present

## 2016-10-05 DIAGNOSIS — M5136 Other intervertebral disc degeneration, lumbar region: Secondary | ICD-10-CM | POA: Diagnosis not present

## 2016-10-05 DIAGNOSIS — M4716 Other spondylosis with myelopathy, lumbar region: Secondary | ICD-10-CM | POA: Diagnosis not present

## 2016-10-05 DIAGNOSIS — M25372 Other instability, left ankle: Secondary | ICD-10-CM | POA: Diagnosis not present

## 2016-10-05 DIAGNOSIS — R2689 Other abnormalities of gait and mobility: Secondary | ICD-10-CM | POA: Diagnosis not present

## 2016-10-08 DIAGNOSIS — M4716 Other spondylosis with myelopathy, lumbar region: Secondary | ICD-10-CM | POA: Diagnosis not present

## 2016-10-08 DIAGNOSIS — M5136 Other intervertebral disc degeneration, lumbar region: Secondary | ICD-10-CM | POA: Diagnosis not present

## 2016-10-08 DIAGNOSIS — R2689 Other abnormalities of gait and mobility: Secondary | ICD-10-CM | POA: Diagnosis not present

## 2016-10-08 DIAGNOSIS — M25372 Other instability, left ankle: Secondary | ICD-10-CM | POA: Diagnosis not present

## 2016-10-11 DIAGNOSIS — M4716 Other spondylosis with myelopathy, lumbar region: Secondary | ICD-10-CM | POA: Diagnosis not present

## 2016-10-11 DIAGNOSIS — M25372 Other instability, left ankle: Secondary | ICD-10-CM | POA: Diagnosis not present

## 2016-10-11 DIAGNOSIS — R2689 Other abnormalities of gait and mobility: Secondary | ICD-10-CM | POA: Diagnosis not present

## 2016-10-11 DIAGNOSIS — M5136 Other intervertebral disc degeneration, lumbar region: Secondary | ICD-10-CM | POA: Diagnosis not present

## 2016-10-14 DIAGNOSIS — R2689 Other abnormalities of gait and mobility: Secondary | ICD-10-CM | POA: Diagnosis not present

## 2016-10-14 DIAGNOSIS — M25372 Other instability, left ankle: Secondary | ICD-10-CM | POA: Diagnosis not present

## 2016-10-14 DIAGNOSIS — M4716 Other spondylosis with myelopathy, lumbar region: Secondary | ICD-10-CM | POA: Diagnosis not present

## 2016-10-14 DIAGNOSIS — M5136 Other intervertebral disc degeneration, lumbar region: Secondary | ICD-10-CM | POA: Diagnosis not present

## 2016-10-15 DIAGNOSIS — H2513 Age-related nuclear cataract, bilateral: Secondary | ICD-10-CM | POA: Diagnosis not present

## 2016-10-18 DIAGNOSIS — M25372 Other instability, left ankle: Secondary | ICD-10-CM | POA: Diagnosis not present

## 2016-10-18 DIAGNOSIS — R2689 Other abnormalities of gait and mobility: Secondary | ICD-10-CM | POA: Diagnosis not present

## 2016-10-18 DIAGNOSIS — M4716 Other spondylosis with myelopathy, lumbar region: Secondary | ICD-10-CM | POA: Diagnosis not present

## 2016-10-18 DIAGNOSIS — M5136 Other intervertebral disc degeneration, lumbar region: Secondary | ICD-10-CM | POA: Diagnosis not present

## 2016-10-22 DIAGNOSIS — M25372 Other instability, left ankle: Secondary | ICD-10-CM | POA: Diagnosis not present

## 2016-10-22 DIAGNOSIS — M4716 Other spondylosis with myelopathy, lumbar region: Secondary | ICD-10-CM | POA: Diagnosis not present

## 2016-10-22 DIAGNOSIS — M5136 Other intervertebral disc degeneration, lumbar region: Secondary | ICD-10-CM | POA: Diagnosis not present

## 2016-10-22 DIAGNOSIS — R2689 Other abnormalities of gait and mobility: Secondary | ICD-10-CM | POA: Diagnosis not present

## 2016-10-26 DIAGNOSIS — M25372 Other instability, left ankle: Secondary | ICD-10-CM | POA: Diagnosis not present

## 2016-10-26 DIAGNOSIS — M4716 Other spondylosis with myelopathy, lumbar region: Secondary | ICD-10-CM | POA: Diagnosis not present

## 2016-10-26 DIAGNOSIS — R2689 Other abnormalities of gait and mobility: Secondary | ICD-10-CM | POA: Diagnosis not present

## 2016-10-26 DIAGNOSIS — M5136 Other intervertebral disc degeneration, lumbar region: Secondary | ICD-10-CM | POA: Diagnosis not present

## 2016-11-02 DIAGNOSIS — M4716 Other spondylosis with myelopathy, lumbar region: Secondary | ICD-10-CM | POA: Diagnosis not present

## 2016-11-02 DIAGNOSIS — R2689 Other abnormalities of gait and mobility: Secondary | ICD-10-CM | POA: Diagnosis not present

## 2016-11-02 DIAGNOSIS — M5136 Other intervertebral disc degeneration, lumbar region: Secondary | ICD-10-CM | POA: Diagnosis not present

## 2016-11-02 DIAGNOSIS — M25372 Other instability, left ankle: Secondary | ICD-10-CM | POA: Diagnosis not present

## 2016-11-04 DIAGNOSIS — R2689 Other abnormalities of gait and mobility: Secondary | ICD-10-CM | POA: Diagnosis not present

## 2016-11-04 DIAGNOSIS — M25372 Other instability, left ankle: Secondary | ICD-10-CM | POA: Diagnosis not present

## 2016-11-04 DIAGNOSIS — M5136 Other intervertebral disc degeneration, lumbar region: Secondary | ICD-10-CM | POA: Diagnosis not present

## 2016-11-04 DIAGNOSIS — M4716 Other spondylosis with myelopathy, lumbar region: Secondary | ICD-10-CM | POA: Diagnosis not present

## 2016-11-08 ENCOUNTER — Ambulatory Visit (INDEPENDENT_AMBULATORY_CARE_PROVIDER_SITE_OTHER): Payer: Medicare Other | Admitting: Neurology

## 2016-11-08 ENCOUNTER — Encounter: Payer: Self-pay | Admitting: Neurology

## 2016-11-08 VITALS — BP 185/99 | HR 49 | Ht 75.0 in | Wt 315.0 lb

## 2016-11-08 DIAGNOSIS — R413 Other amnesia: Secondary | ICD-10-CM | POA: Diagnosis not present

## 2016-11-08 DIAGNOSIS — R269 Unspecified abnormalities of gait and mobility: Secondary | ICD-10-CM | POA: Diagnosis not present

## 2016-11-08 NOTE — Patient Instructions (Signed)
   We will check a MRI of the brain, and follow the memory over time.

## 2016-11-08 NOTE — Progress Notes (Signed)
Reason for visit: Memory disturbance  Referring physician: Dr. Maree Erie is a 82 y.o. male  History of present illness:  Mitchell [name] is an 82 year old right-handed white male with a history of some memory problems over the last one year. The patient claims that he had back surgery in July 2017, shortly thereafter he began noting some problems with cognitive processing. The patient denies that he had any issues with memory before that time. The patient comes in with his wife who concurs with this statement. The patient had been cutting back with job tasks mainly because of leg weakness and difficulty with ambulation. He stopped working altogether in December 2017. 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 The patient has undergone recent blood work through Dr. Renold Genta that revealed a low-normal vitamin B12 level. He was placed on vitamin H41 and folic acid, since going on this the patient and his wife indicate a dramatic improvement in his cognitive functioning. A repeat vitamin B-12 level done one month later shows that he gained good improvement with blood levels of B12 on the oral supplementation. The patient had noted significant problems with remembering names for people and recent events, this has gotten much better. He would not recognize places that he has been to before. The patient does not misplace things about the house frequently. He sleeps well at night, he has a good energy level during the day. He denies issues controlling the bowels or the bladder. He does have some numbness in the fourth and fifth fingers of the right hand and some burning sensations in the feet. He has significant weakness of the legs, right greater than left. He is sent to this office for further evaluation.  Past Medical History:  Diagnosis Date  . Acute on chronic systolic heart failure, usually asymptomatic but with  tachycardia HF became acute now resolved 05/26/2011  . Adenomatous polyp   . Ankle fracture    right  . Anxiety   . Arthritis   . Asthma    Symbicort daily as needed  . Atrial flutter with rapid ventricular response ([place]) 05/25/2011  . CHF, acute, mild secondary to EF 40-45% an a. flutter with RVR 05/25/2011   takes Furosemide daily as needed  . Constipation    takes Miralax daily as needed  . Coronary artery disease    2D ECHO, 08/24/2011 - EF >55%; NUCLEAR STRESS TEST, 04/21/2010 - mild ischemia in Basal Inferolateral and Mid Inferolateral regions, post-stress EF 56%,   . Depression    takes Cymbalta daily  . Dysrhythmia    atrial fibrilation  . GERD (gastroesophageal reflux disease)    takes Pantoprazole daily  . History of blood transfusion 53  yrs ago   got hives  . Hyperlipidemia    takes Zetia daily  . Hypertension    takes Amlodipine and Atenolol daily  . LV dysfunction, EF by ECHO 40-45% 04/29/11 05/25/2011  . Melanoma ([place])    l neck  . Pneumonia   . Prostatitis   . Shortness of breath   . Sleep apnea    no longer has since loss of 70 pounds  . SOB (shortness of breath), secondary to SOB 05/25/2011  . Spinal stenosis     Past Surgical History:  Procedure Laterality Date  . APPENDECTOMY    . BACK SURGERY    . CARDIAC CATHETERIZATION  03/18/2000   PDA between the first PLA branch stented with a 3x8 Guidant Penta stent resulting in reduction of a 99% stenosis to 0% residual  . CARDIOVERSION  05/26/2011   Procedure: CARDIOVERSION;  Surgeon: Sanda Klein, MD;  Location: [facility];  Service: Cardiovascular;  Laterality: N/A;  . CORONARY ANGIOPLASTY    . HERNIA REPAIR     umbilical  . JOINT REPLACEMENT     left knee  . JOINT REPLACEMENT     left hip  . knee replacements Bilateral   . LUMBAR LAMINECTOMY/DECOMPRESSION MICRODISCECTOMY    . ROTATOR CUFF REPAIR Left   . TEE WITHOUT CARDIOVERSION  05/26/2011   Procedure: TRANSESOPHAGEAL ECHOCARDIOGRAM (TEE);  Surgeon: Sanda Klein, MD;  Location: [facility] ENDOSCOPY;  Service: Cardiovascular;  Laterality: N/A;    Family History  Problem Relation Age of Onset  . Heart failure Mother 93  . Heart failure Father 107  . Cancer Paternal Grandfather     Social history:  reports that he has never smoked. He quit smokeless tobacco use about 44 years ago. His smokeless tobacco use included Chew. He reports that he does not drink alcohol or use drugs.  Medications:  Prior to Admission medications   Medication Sig Start Date End Date Taking? Authorizing Provider  acetaminophen (TYLENOL) 325 MG tablet Take 2 tablets (650 mg total) by mouth 3 (three) times daily as needed for mild pain or moderate pain. 11/13/15  Yes Love, Ivan Anchors, PA-C  amLODipine (NORVASC) 5 MG tablet TAKE 1 TABLET BY MOUTH DAILY. 01/29/16  Yes Elby Showers, MD  Aspirin (ASPIR-81 PO) Take 81 mg by mouth daily.   Yes [provider]  bisoprolol (ZEBETA) 5 MG tablet Take 1 tablet (5 mg total) by mouth daily. 03/25/16  Yes Tanda Rockers, MD  budesonide-formoterol (SYMBICORT) 80-4.5 MCG/ACT inhaler Take 2 puffs first thing in am and then another 2 puffs about 12 hours later. Patient taking differently: Inhale 2 puffs into the lungs as needed. Take 2 puffs first thing in am and then another 2 puffs about 12 hours later. 02/27/16  Yes Tanda Rockers, MD  calcium carbonate (OS-CAL) 600 MG TABS Take 600 mg by mouth 2 (two) times daily with a meal. Reported on 05/29/2015   Yes [provider]  carboxymethylcellulose (REFRESH PLUS) 0.5 % SOLN Place 1 drop into both eyes 3 (three) times daily as needed (dry eyes).   Yes [provider]  docusate sodium (COLACE) 100 MG capsule Take 1 capsule (100 mg total) by mouth 2 (two) times daily. 11/21/15  Yes Love, Ivan Anchors, PA-C  ezetimibe (ZETIA) 10 MG tablet TAKE 1 TABLET BY MOUTH ONCE DAILY. 06/18/16  Yes BaxleyCresenciano Lick, MD  furosemide (LASIX) 40 MG tablet One half tablet every other day 08/31/16  Yes  Baxley, Cresenciano Lick, MD  hydrocortisone cream 1 % Apply 1 application topically daily as needed for itching.    Yes [provider]  meloxicam (MOBIC) 15 MG tablet Take 15 mg by mouth daily.  09/10/15  Yes [provider]  pantoprazole (PROTONIX) 40 MG tablet TAKE 1 TABLET BY MOUTH DAILY. 09/20/16  Yes Baxley, Cresenciano Lick, MD  polyethylene glycol (MIRALAX / GLYCOLAX) packet Take 17 g by mouth at bedtime.    Yes [provider]      Allergies  Allergen Reactions  . Statins Other (See Comments)    Leg pain  . Adhesive [Tape] Itching and Rash    Please use "paper" tape  ROS:  Out of a complete 14 system review of symptoms, the patient complains only of the following symptoms, and all other reviewed systems are negative.  Hearing loss Constipation Urination problems, impotence Joint pain, skin sensitivity Memory loss, dizziness with standing  Blood pressure (!) 185/99, pulse (!) 49, height 6\' 3"  (1.905 m), weight (!) 315 lb (142.9 kg).  Physical Exam  General: The patient is alert and cooperative at the time of the examination. The patient is moderately to markedly obese.  Eyes: Pupils are equal, round, and reactive to light. Discs are flat bilaterally.  Neck: The neck is supple, no carotid bruits are noted.  Respiratory: The respiratory examination is clear.  Cardiovascular: The cardiovascular examination reveals a regular rate and rhythm, no obvious murmurs or rubs are noted.  Skin: Extremities are with 2+ edema below the knees bilaterally.  Neurologic Exam  Mental status: The patient is alert and oriented x 3 at the time of the examination. The Mini-Mental Status Examination done today shows a total score 23/30.  Cranial nerves: Facial symmetry is present. There is good sensation of the face to pinprick and soft touch bilaterally. The strength of the facial muscles and the muscles to head turning and shoulder shrug are normal bilaterally. Speech is well  enunciated, no aphasia or dysarthria is noted. Extraocular movements are full. Visual fields are full. The tongue is midline, and the patient has symmetric elevation of the soft palate. No obvious hearing deficits are noted.  Motor: The motor testing reveals 5 over 5 strength of the upper extremities, with exception of some slight weakness of intrinsic muscles of the hands, right greater than left. The patient has incomplete abduction of the right arm secondary to immobility across the right shoulder. With the lower extremities, bilateral foot drops are seen, right greater than left. The patient has 4 minus/5 strength with hip flexion on the right, 4/5 on the left. Good extensor and flexor strength of the knee is noted bilaterally. Good symmetric motor tone is noted throughout.  Sensory: Sensory testing is intact to pinprick, soft touch, vibration sensation, and position sense on the upper extremities. With the lower extremity is, there is a stocking pattern pinprick sensory deficit across the ankle bilaterally with mild impairment of vibration sensation in both feet, decreased position sense in both feet No evidence of extinction is noted.  Coordination: Cerebellar testing reveals good finger-nose-finger bilaterally. The patient is unable to perform heel-to-shin well on either lower extremity secondary to hip flexor weakness.  Gait and station: Gait is wide-based, unsteady. The patient uses a walker for ambulation. Tandem gait was not attempted. Romberg is unsteady.  Reflexes: Deep tendon reflexes are symmetric, but are depressed bilaterally. Toes are downgoing bilaterally.   CT head 05/09/13:  IMPRESSION: 1. No acute or posttraumatic deformity identified. 2. Progressive periventricular white matter hypoattenuation, likely secondary to small vessel ischemic change.  * CT scan images were reviewed online. I agree with the written report.    Assessment/Plan:  1. Mild memory disorder  2.  Gait disorder  3. Probable peripheral neuropathy by clinical examination   The patient has had some alteration of memory following surgery. A postsurgical, post-anesthesia encephalopathy can occur, but usually improves 6-8 weeks following surgery. The patient has had persistence of memory issues, but he has had a significant improvement in cognitive functioning after supplementation with vitamin B12. The patient had a low normal vitamin B-12 level that may have been functionally low for him. He will continue supplementation, we  will check MRI of the brain. Prior CT scan of the brain shows at least a moderate level small vessel disease, this could be a contributing factor to his memory issues. We will check back in 4 or 5 months, if the memory seems to progress, we will start a medication for memory such as Aricept.   Jill Alexanders MD 11/08/2016 2:15 PM  [facility] [address] [place], [place] [address]  Phone [phone] Fax [phone]

## 2016-11-09 DIAGNOSIS — M4716 Other spondylosis with myelopathy, lumbar region: Secondary | ICD-10-CM | POA: Diagnosis not present

## 2016-11-09 DIAGNOSIS — R2689 Other abnormalities of gait and mobility: Secondary | ICD-10-CM | POA: Diagnosis not present

## 2016-11-09 DIAGNOSIS — M5136 Other intervertebral disc degeneration, lumbar region: Secondary | ICD-10-CM | POA: Diagnosis not present

## 2016-11-09 DIAGNOSIS — M25372 Other instability, left ankle: Secondary | ICD-10-CM | POA: Diagnosis not present

## 2016-11-10 ENCOUNTER — Telehealth: Payer: Self-pay

## 2016-11-10 MED ORDER — MELOXICAM 15 MG PO TABS: 15.0000 mg | ORAL_TABLET | Freq: Every day | ORAL | 3 refills | Status: DC

## 2016-11-10 NOTE — Telephone Encounter (Signed)
Pt's wife called and asked if Dr. Renold Genta would take over prescribing his meloxicam, she wants all his prescription filled from the same dr. Please advise

## 2016-11-10 NOTE — Telephone Encounter (Signed)
Sent in a 1 year supply per Dr. Renold Genta, wife is aware

## 2016-11-11 DIAGNOSIS — R2689 Other abnormalities of gait and mobility: Secondary | ICD-10-CM | POA: Diagnosis not present

## 2016-11-11 DIAGNOSIS — M5136 Other intervertebral disc degeneration, lumbar region: Secondary | ICD-10-CM | POA: Diagnosis not present

## 2016-11-11 DIAGNOSIS — M4716 Other spondylosis with myelopathy, lumbar region: Secondary | ICD-10-CM | POA: Diagnosis not present

## 2016-11-11 DIAGNOSIS — M25372 Other instability, left ankle: Secondary | ICD-10-CM | POA: Diagnosis not present

## 2016-11-15 DIAGNOSIS — R2689 Other abnormalities of gait and mobility: Secondary | ICD-10-CM | POA: Diagnosis not present

## 2016-11-15 DIAGNOSIS — M4716 Other spondylosis with myelopathy, lumbar region: Secondary | ICD-10-CM | POA: Diagnosis not present

## 2016-11-15 DIAGNOSIS — M25372 Other instability, left ankle: Secondary | ICD-10-CM | POA: Diagnosis not present

## 2016-11-15 DIAGNOSIS — M5136 Other intervertebral disc degeneration, lumbar region: Secondary | ICD-10-CM | POA: Diagnosis not present

## 2016-11-18 DIAGNOSIS — M4716 Other spondylosis with myelopathy, lumbar region: Secondary | ICD-10-CM | POA: Diagnosis not present

## 2016-11-18 DIAGNOSIS — M5136 Other intervertebral disc degeneration, lumbar region: Secondary | ICD-10-CM | POA: Diagnosis not present

## 2016-11-18 DIAGNOSIS — R2689 Other abnormalities of gait and mobility: Secondary | ICD-10-CM | POA: Diagnosis not present

## 2016-11-18 DIAGNOSIS — M25372 Other instability, left ankle: Secondary | ICD-10-CM | POA: Diagnosis not present

## 2016-11-22 DIAGNOSIS — M4716 Other spondylosis with myelopathy, lumbar region: Secondary | ICD-10-CM | POA: Diagnosis not present

## 2016-11-22 DIAGNOSIS — R2689 Other abnormalities of gait and mobility: Secondary | ICD-10-CM | POA: Diagnosis not present

## 2016-11-22 DIAGNOSIS — M25372 Other instability, left ankle: Secondary | ICD-10-CM | POA: Diagnosis not present

## 2016-11-22 DIAGNOSIS — M5136 Other intervertebral disc degeneration, lumbar region: Secondary | ICD-10-CM | POA: Diagnosis not present

## 2016-11-25 ENCOUNTER — Ambulatory Visit
Admission: RE | Admit: 2016-11-25 | Discharge: 2016-11-25 | Disposition: A | Discharge status: Home / Self Care | Payer: Medicare Other | Source: Ambulatory Visit | Attending: Neurology | Admitting: Neurology

## 2016-11-25 DIAGNOSIS — R413 Other amnesia: Secondary | ICD-10-CM | POA: Diagnosis not present

## 2016-11-26 ENCOUNTER — Telehealth: Payer: Self-pay | Admitting: Neurology

## 2016-11-26 DIAGNOSIS — M4716 Other spondylosis with myelopathy, lumbar region: Secondary | ICD-10-CM | POA: Diagnosis not present

## 2016-11-26 DIAGNOSIS — M25372 Other instability, left ankle: Secondary | ICD-10-CM | POA: Diagnosis not present

## 2016-11-26 DIAGNOSIS — R2689 Other abnormalities of gait and mobility: Secondary | ICD-10-CM | POA: Diagnosis not present

## 2016-11-26 DIAGNOSIS — M5136 Other intervertebral disc degeneration, lumbar region: Secondary | ICD-10-CM | POA: Diagnosis not present

## 2016-11-26 NOTE — Telephone Encounter (Signed)
I called the patient. The MRI the brain does show a moderate level small vessel ischemic changes, this has progressed from 2006.  The patient does have hypertension, they will need to follow blood pressures closely, he is to remain on low-dose aspirin. The patient was significantly hypertensive when he was seen in office.   MRI brain 11/26/16:  IMPRESSION:  This MRI of the brain without contrast shows the following: 1.    Moderate cortical atrophy, a little more pronounced in the mesial temporal lobes. No atrophy is noted on the previous 09/15/2004 MRI. 2.    Moderately advanced chronic microvascular ischemic changes also markedly progressed when compared to the 2006 MRI. 3.    There are no acute findings.

## 2016-11-30 DIAGNOSIS — M25372 Other instability, left ankle: Secondary | ICD-10-CM | POA: Diagnosis not present

## 2016-11-30 DIAGNOSIS — R2689 Other abnormalities of gait and mobility: Secondary | ICD-10-CM | POA: Diagnosis not present

## 2016-11-30 DIAGNOSIS — M4716 Other spondylosis with myelopathy, lumbar region: Secondary | ICD-10-CM | POA: Diagnosis not present

## 2016-11-30 DIAGNOSIS — M5136 Other intervertebral disc degeneration, lumbar region: Secondary | ICD-10-CM | POA: Diagnosis not present

## 2016-12-03 DIAGNOSIS — R2689 Other abnormalities of gait and mobility: Secondary | ICD-10-CM | POA: Diagnosis not present

## 2016-12-03 DIAGNOSIS — M4716 Other spondylosis with myelopathy, lumbar region: Secondary | ICD-10-CM | POA: Diagnosis not present

## 2016-12-03 DIAGNOSIS — M5136 Other intervertebral disc degeneration, lumbar region: Secondary | ICD-10-CM | POA: Diagnosis not present

## 2016-12-03 DIAGNOSIS — M25372 Other instability, left ankle: Secondary | ICD-10-CM | POA: Diagnosis not present

## 2016-12-06 DIAGNOSIS — M25372 Other instability, left ankle: Secondary | ICD-10-CM | POA: Diagnosis not present

## 2016-12-06 DIAGNOSIS — M4716 Other spondylosis with myelopathy, lumbar region: Secondary | ICD-10-CM | POA: Diagnosis not present

## 2016-12-06 DIAGNOSIS — M5136 Other intervertebral disc degeneration, lumbar region: Secondary | ICD-10-CM | POA: Diagnosis not present

## 2016-12-06 DIAGNOSIS — R2689 Other abnormalities of gait and mobility: Secondary | ICD-10-CM | POA: Diagnosis not present

## 2016-12-09 DIAGNOSIS — M25372 Other instability, left ankle: Secondary | ICD-10-CM | POA: Diagnosis not present

## 2016-12-09 DIAGNOSIS — M4716 Other spondylosis with myelopathy, lumbar region: Secondary | ICD-10-CM | POA: Diagnosis not present

## 2016-12-09 DIAGNOSIS — R2689 Other abnormalities of gait and mobility: Secondary | ICD-10-CM | POA: Diagnosis not present

## 2016-12-09 DIAGNOSIS — M5136 Other intervertebral disc degeneration, lumbar region: Secondary | ICD-10-CM | POA: Diagnosis not present

## 2016-12-13 DIAGNOSIS — R2689 Other abnormalities of gait and mobility: Secondary | ICD-10-CM | POA: Diagnosis not present

## 2016-12-13 DIAGNOSIS — M4716 Other spondylosis with myelopathy, lumbar region: Secondary | ICD-10-CM | POA: Diagnosis not present

## 2016-12-13 DIAGNOSIS — M25372 Other instability, left ankle: Secondary | ICD-10-CM | POA: Diagnosis not present

## 2016-12-13 DIAGNOSIS — M5136 Other intervertebral disc degeneration, lumbar region: Secondary | ICD-10-CM | POA: Diagnosis not present

## 2016-12-16 DIAGNOSIS — M5136 Other intervertebral disc degeneration, lumbar region: Secondary | ICD-10-CM | POA: Diagnosis not present

## 2016-12-16 DIAGNOSIS — M4716 Other spondylosis with myelopathy, lumbar region: Secondary | ICD-10-CM | POA: Diagnosis not present

## 2016-12-16 DIAGNOSIS — M25372 Other instability, left ankle: Secondary | ICD-10-CM | POA: Diagnosis not present

## 2016-12-16 DIAGNOSIS — R2689 Other abnormalities of gait and mobility: Secondary | ICD-10-CM | POA: Diagnosis not present

## 2016-12-21 DIAGNOSIS — R2689 Other abnormalities of gait and mobility: Secondary | ICD-10-CM | POA: Diagnosis not present

## 2016-12-21 DIAGNOSIS — M25372 Other instability, left ankle: Secondary | ICD-10-CM | POA: Diagnosis not present

## 2016-12-21 DIAGNOSIS — M5136 Other intervertebral disc degeneration, lumbar region: Secondary | ICD-10-CM | POA: Diagnosis not present

## 2016-12-21 DIAGNOSIS — M4716 Other spondylosis with myelopathy, lumbar region: Secondary | ICD-10-CM | POA: Diagnosis not present

## 2016-12-22 ENCOUNTER — Telehealth: Payer: Self-pay

## 2016-12-22 NOTE — Telephone Encounter (Signed)
[facility] is contracted by the patients insurance to look at his medication to be sure that there are no adherences. The patient asked that they go over all of that information with our office. I went over all the medications and there were no adherences and the only medication they had listed that I did not was a B12 Supplement. They will call the patient to let him know it is complete and no problems or concerns noted.

## 2016-12-24 DIAGNOSIS — M4716 Other spondylosis with myelopathy, lumbar region: Secondary | ICD-10-CM | POA: Diagnosis not present

## 2016-12-24 DIAGNOSIS — R2689 Other abnormalities of gait and mobility: Secondary | ICD-10-CM | POA: Diagnosis not present

## 2016-12-24 DIAGNOSIS — M5136 Other intervertebral disc degeneration, lumbar region: Secondary | ICD-10-CM | POA: Diagnosis not present

## 2016-12-24 DIAGNOSIS — M25372 Other instability, left ankle: Secondary | ICD-10-CM | POA: Diagnosis not present

## 2016-12-27 DIAGNOSIS — M25372 Other instability, left ankle: Secondary | ICD-10-CM | POA: Diagnosis not present

## 2016-12-27 DIAGNOSIS — M5136 Other intervertebral disc degeneration, lumbar region: Secondary | ICD-10-CM | POA: Diagnosis not present

## 2016-12-27 DIAGNOSIS — M4716 Other spondylosis with myelopathy, lumbar region: Secondary | ICD-10-CM | POA: Diagnosis not present

## 2016-12-27 DIAGNOSIS — R2689 Other abnormalities of gait and mobility: Secondary | ICD-10-CM | POA: Diagnosis not present

## 2016-12-29 DIAGNOSIS — M5416 Radiculopathy, lumbar region: Secondary | ICD-10-CM | POA: Diagnosis not present

## 2016-12-29 DIAGNOSIS — M48062 Spinal stenosis, lumbar region with neurogenic claudication: Secondary | ICD-10-CM | POA: Diagnosis not present

## 2016-12-29 DIAGNOSIS — M21371 Foot drop, right foot: Secondary | ICD-10-CM | POA: Diagnosis not present

## 2016-12-29 DIAGNOSIS — M545 Low back pain: Secondary | ICD-10-CM | POA: Diagnosis not present

## 2016-12-30 ENCOUNTER — Encounter: Payer: Self-pay | Admitting: Internal Medicine

## 2016-12-30 ENCOUNTER — Ambulatory Visit (INDEPENDENT_AMBULATORY_CARE_PROVIDER_SITE_OTHER): Payer: Medicare Other | Admitting: Internal Medicine

## 2016-12-30 VITALS — BP 138/88 | HR 75 | Temp 98.0°F | Wt 315.0 lb

## 2016-12-30 DIAGNOSIS — M25372 Other instability, left ankle: Secondary | ICD-10-CM | POA: Diagnosis not present

## 2016-12-30 DIAGNOSIS — M4716 Other spondylosis with myelopathy, lumbar region: Secondary | ICD-10-CM | POA: Diagnosis not present

## 2016-12-30 DIAGNOSIS — I10 Essential (primary) hypertension: Secondary | ICD-10-CM | POA: Diagnosis not present

## 2016-12-30 DIAGNOSIS — R2689 Other abnormalities of gait and mobility: Secondary | ICD-10-CM | POA: Diagnosis not present

## 2016-12-30 DIAGNOSIS — M5136 Other intervertebral disc degeneration, lumbar region: Secondary | ICD-10-CM | POA: Diagnosis not present

## 2016-12-30 DIAGNOSIS — R030 Elevated blood-pressure reading, without diagnosis of hypertension: Secondary | ICD-10-CM

## 2016-12-30 NOTE — Progress Notes (Signed)
   Subjective:    Patient ID: Mitchell [name], male    DOB: 11-Nov-1934, 82 y.o.   MRN: [id number]  HPI 82 year old Male was at Dr. Melven Sartorius office yesterday and was noted to have elevated blood pressure. This concerned his daughter and his wife. Wife came today asking for appointment for him regarding this.  He is followed by Neurology for memory loss. Memory apparently it seemed to improve initially with B-12 supplement orally but has not been as good recently according to his wife.  Continues to go to [facility] for exercise and therapy. He has a chronic foot drop.    Review of Systems see above     Objective:   Physical Exam Neck is supple without JVD thyromegaly or carotid bruits. Chest clear. Cardiac exam regular rate and rhythm normal S1 and S2. Extremities without pitting edema       Assessment & Plan:  Elevated blood pressure-reading today is 138/88  Morbid obesity  Memory loss  Plan: We have reviewed his medications and it seems that he has not been taking amlodipine recently. Wife says she may of overlooked it. He'll start back on that and they will monitor his blood pressure. He remains on Zebeta 5 mg daily prescribed previously by pulmonologist. He is on Lasix 40 mg daily.

## 2017-01-03 DIAGNOSIS — M25372 Other instability, left ankle: Secondary | ICD-10-CM | POA: Diagnosis not present

## 2017-01-03 DIAGNOSIS — M5136 Other intervertebral disc degeneration, lumbar region: Secondary | ICD-10-CM | POA: Diagnosis not present

## 2017-01-03 DIAGNOSIS — M4716 Other spondylosis with myelopathy, lumbar region: Secondary | ICD-10-CM | POA: Diagnosis not present

## 2017-01-03 DIAGNOSIS — R2689 Other abnormalities of gait and mobility: Secondary | ICD-10-CM | POA: Diagnosis not present

## 2017-01-06 DIAGNOSIS — M25372 Other instability, left ankle: Secondary | ICD-10-CM | POA: Diagnosis not present

## 2017-01-06 DIAGNOSIS — M5136 Other intervertebral disc degeneration, lumbar region: Secondary | ICD-10-CM | POA: Diagnosis not present

## 2017-01-06 DIAGNOSIS — R2689 Other abnormalities of gait and mobility: Secondary | ICD-10-CM | POA: Diagnosis not present

## 2017-01-06 DIAGNOSIS — M4716 Other spondylosis with myelopathy, lumbar region: Secondary | ICD-10-CM | POA: Diagnosis not present

## 2017-01-10 DIAGNOSIS — M25372 Other instability, left ankle: Secondary | ICD-10-CM | POA: Diagnosis not present

## 2017-01-10 DIAGNOSIS — M5136 Other intervertebral disc degeneration, lumbar region: Secondary | ICD-10-CM | POA: Diagnosis not present

## 2017-01-10 DIAGNOSIS — M4716 Other spondylosis with myelopathy, lumbar region: Secondary | ICD-10-CM | POA: Diagnosis not present

## 2017-01-10 DIAGNOSIS — R2689 Other abnormalities of gait and mobility: Secondary | ICD-10-CM | POA: Diagnosis not present

## 2017-01-13 DIAGNOSIS — R2689 Other abnormalities of gait and mobility: Secondary | ICD-10-CM | POA: Diagnosis not present

## 2017-01-13 DIAGNOSIS — M5136 Other intervertebral disc degeneration, lumbar region: Secondary | ICD-10-CM | POA: Diagnosis not present

## 2017-01-13 DIAGNOSIS — M25372 Other instability, left ankle: Secondary | ICD-10-CM | POA: Diagnosis not present

## 2017-01-13 DIAGNOSIS — M4716 Other spondylosis with myelopathy, lumbar region: Secondary | ICD-10-CM | POA: Diagnosis not present

## 2017-01-15 NOTE — Patient Instructions (Addendum)
Restart amlodipine 5 mg daily. Call if blood pressure is not under good control with home blood pressure readings. Follow-up in November.

## 2017-01-18 DIAGNOSIS — M25372 Other instability, left ankle: Secondary | ICD-10-CM | POA: Diagnosis not present

## 2017-01-18 DIAGNOSIS — R2689 Other abnormalities of gait and mobility: Secondary | ICD-10-CM | POA: Diagnosis not present

## 2017-01-18 DIAGNOSIS — M4716 Other spondylosis with myelopathy, lumbar region: Secondary | ICD-10-CM | POA: Diagnosis not present

## 2017-01-18 DIAGNOSIS — M5136 Other intervertebral disc degeneration, lumbar region: Secondary | ICD-10-CM | POA: Diagnosis not present

## 2017-01-18 IMAGING — CT CT HEAD W/O CM
4 series · 17 of 47 positions shown, 19 images · non-contrast
Comparison: 05/09/2013

CLINICAL DATA: Lumbar surgery yesterday/ sudden onset memory loss

EXAM:
CT HEAD WITHOUT CONTRAST
TECHNIQUE: Contiguous axial images were obtained from the base of the skull
through the vertex without intravenous contrast.

[Series 2: head without · axial · non-contrast · 0.39mm/px · z∈[-75,+45]mm · 7 of 34 slices shown, 9 images]
[im 5/34  brain]
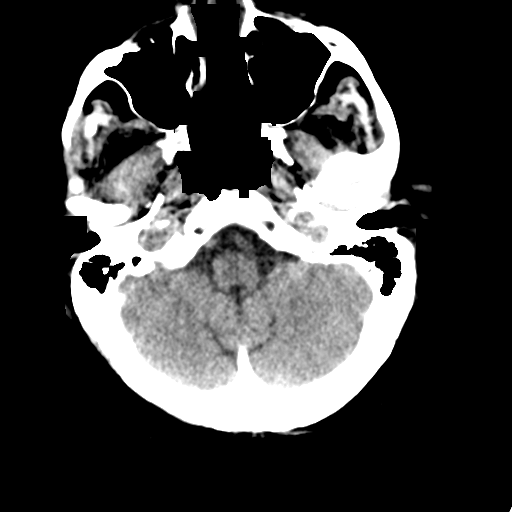
[im 5/34  bone]
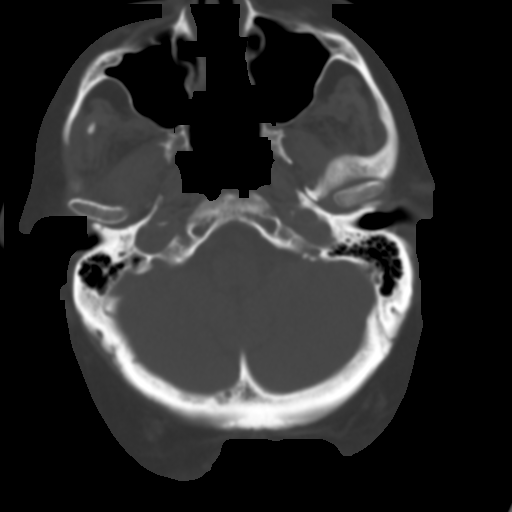
[im 9/34  brain]
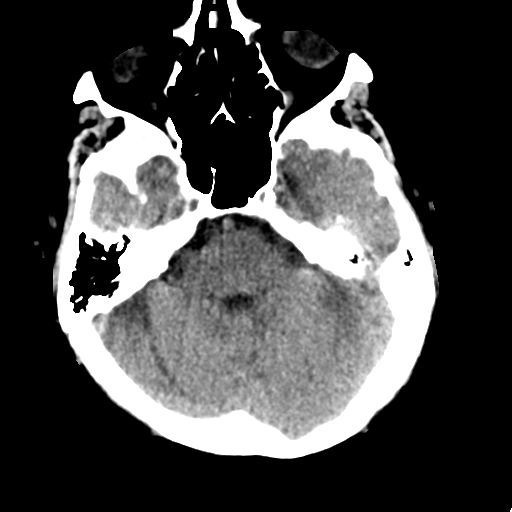
[im 13/34  brain]
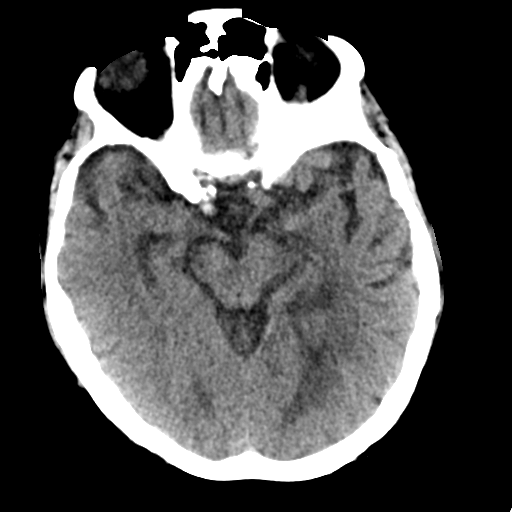
[im 17/34  brain]
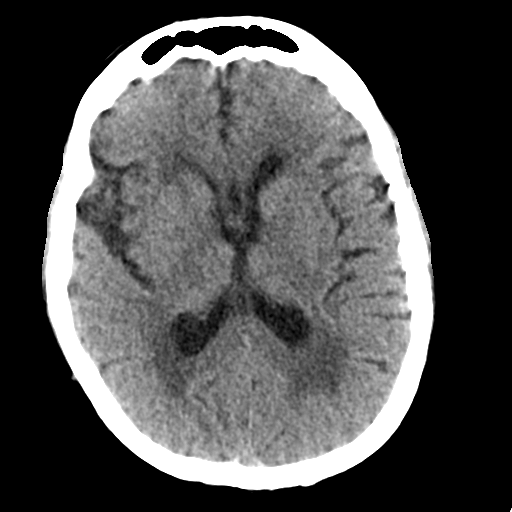
[im 21/34  brain]
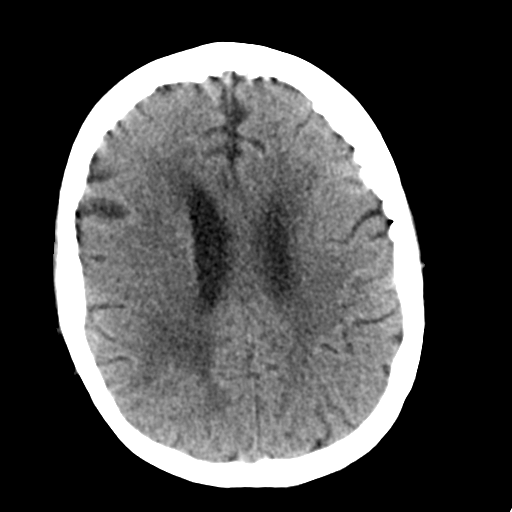
[im 21/34  bone]
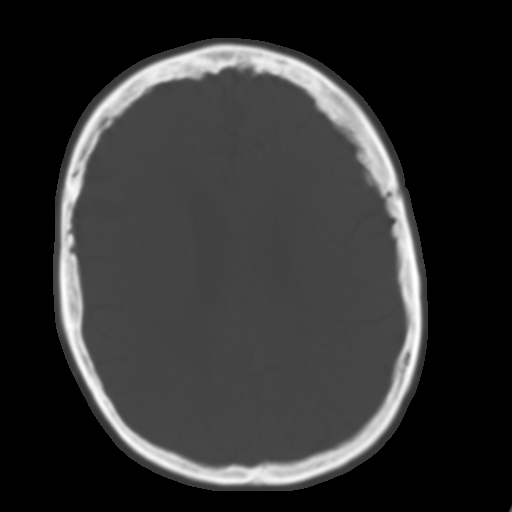
[im 25/34  brain]
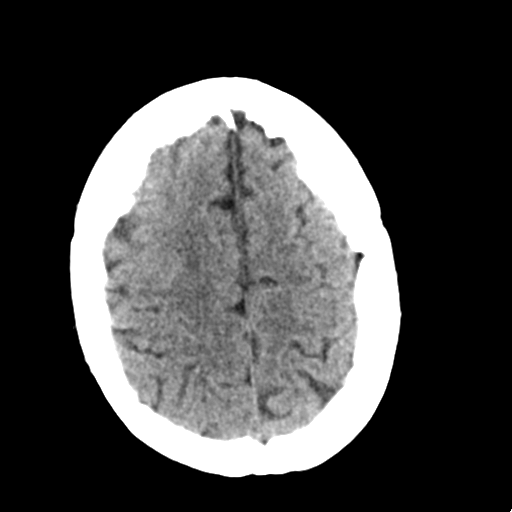
[im 29/34  brain]
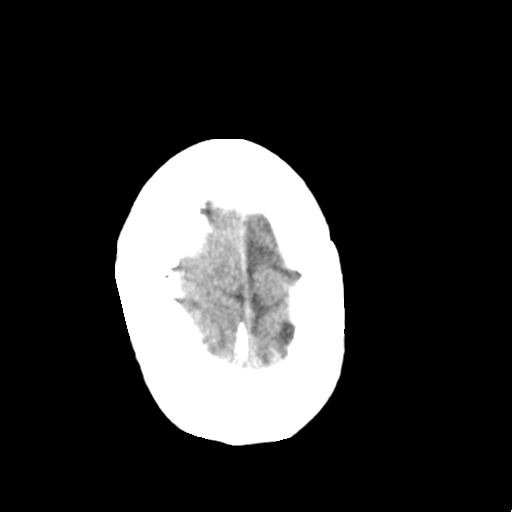

[Series 3: head bone · axial · 0.39mm/px · z∈[-79,-21]mm · 4 of 84 slices shown]
[im 9/84  bone]
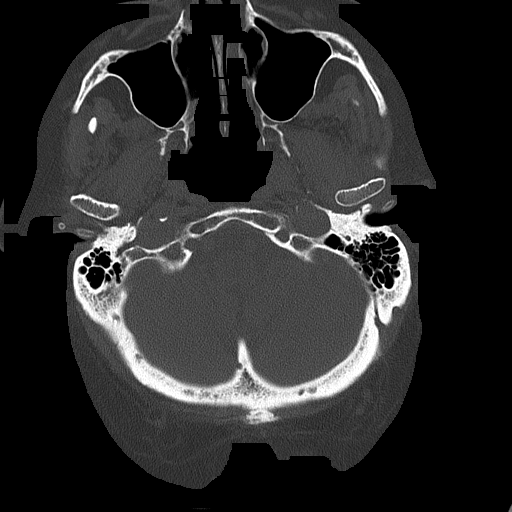
[im 17/84  bone]
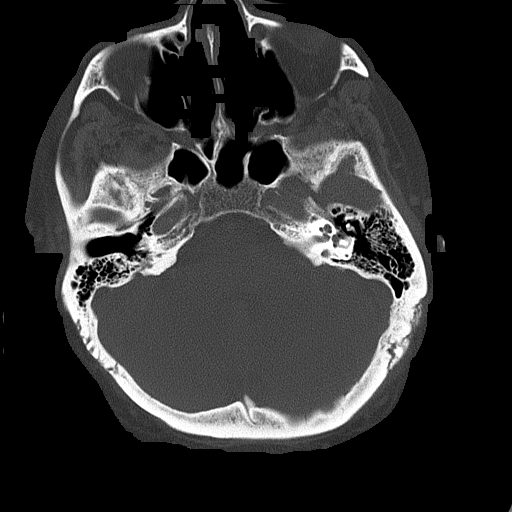
[im 25/84  bone]
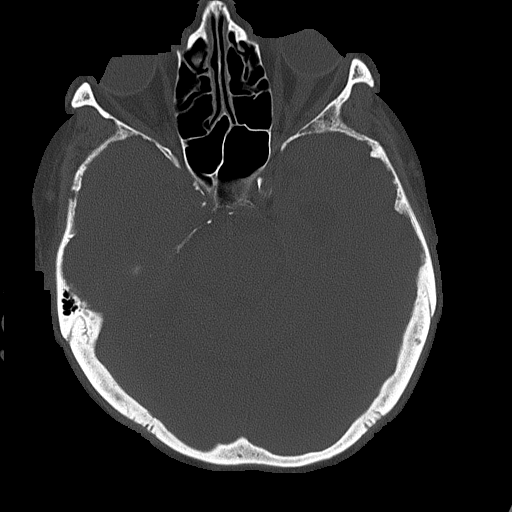
[im 38/84  bone]
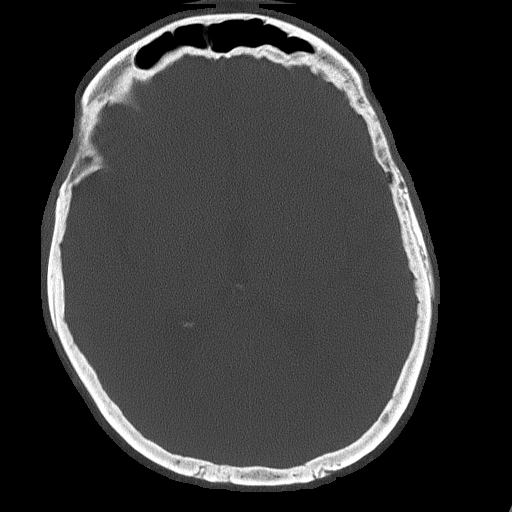

[Series 4: head without cor · coronal · non-contrast · 0.32mm/px · 3 of 67 slices shown]
[im 23/67  brain]
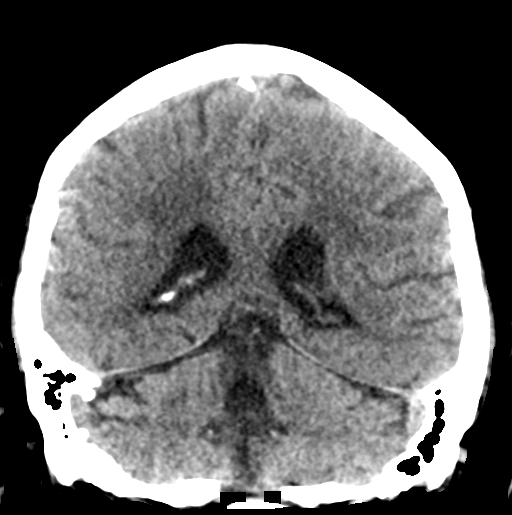
[im 30/67  brain]
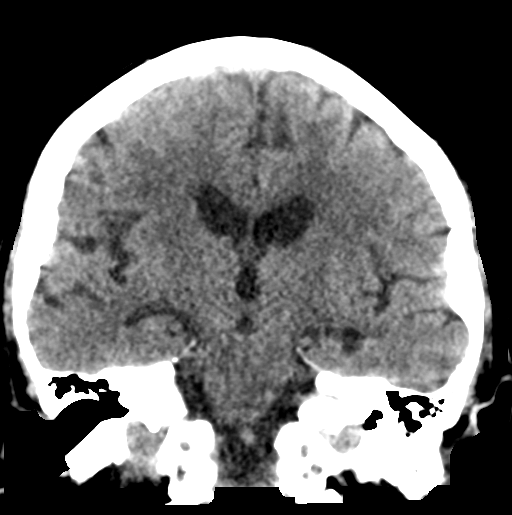
[im 37/67  brain]
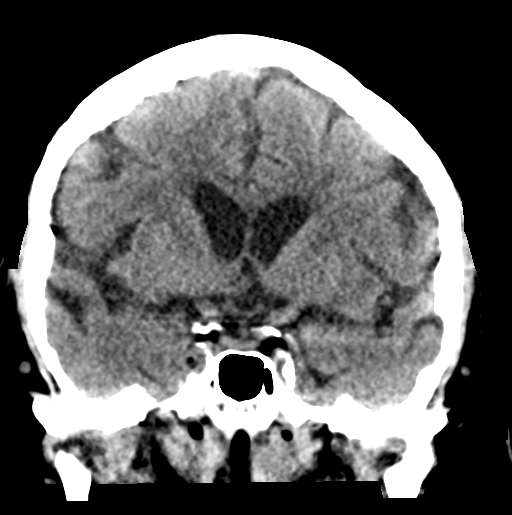

[Series 5: head without sag · sagittal · non-contrast · 0.33mm/px · 3 of 67 slices shown]
[im 23/67  brain]
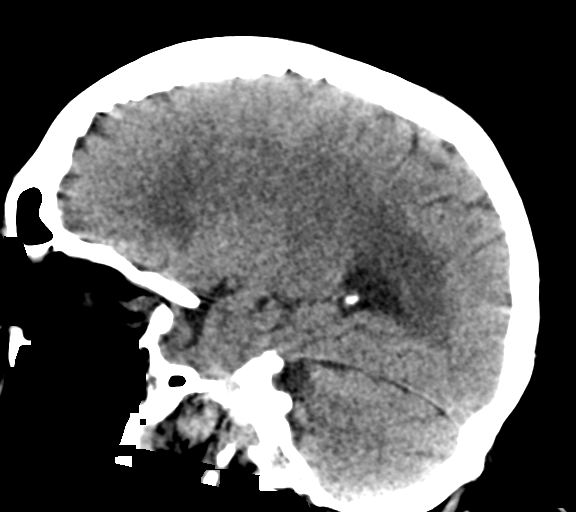
[im 34/67  brain]
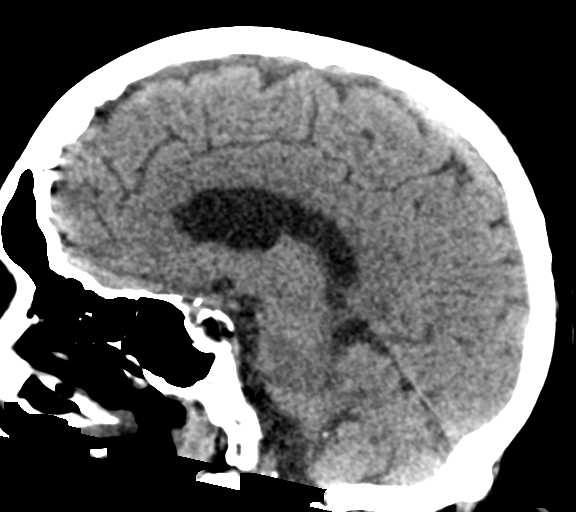
[im 45/67  brain]
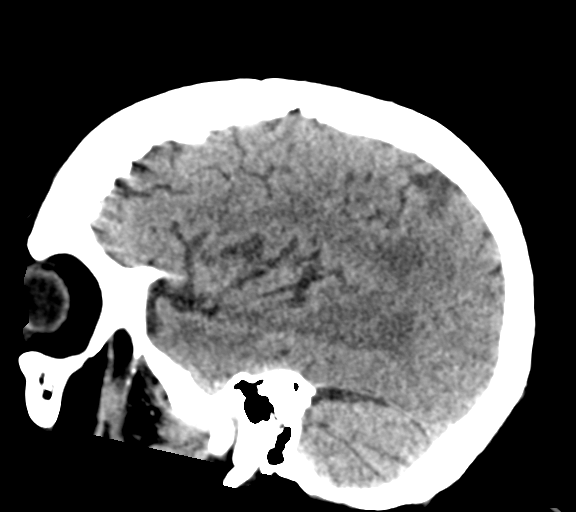

[17 of 47 positions shown; findings below may reference images not displayed]

FINDINGS: Periventricular white matter changes are consistent with small
vessel disease. There is mild central cortical atrophy. There is no
intra or extra-axial fluid collection or mass lesion. The basilar
cisterns and ventricles have a normal appearance. There is no CT
evidence for acute infarction or hemorrhage. Bone windows are
unremarkable.
IMPRESSION: 1. The atrophy and small vessel disease.
2.  No evidence for acute intracranial abnormality.

## 2017-01-21 DIAGNOSIS — M5136 Other intervertebral disc degeneration, lumbar region: Secondary | ICD-10-CM | POA: Diagnosis not present

## 2017-01-21 DIAGNOSIS — R2689 Other abnormalities of gait and mobility: Secondary | ICD-10-CM | POA: Diagnosis not present

## 2017-01-21 DIAGNOSIS — M4716 Other spondylosis with myelopathy, lumbar region: Secondary | ICD-10-CM | POA: Diagnosis not present

## 2017-01-21 DIAGNOSIS — M25372 Other instability, left ankle: Secondary | ICD-10-CM | POA: Diagnosis not present

## 2017-01-23 IMAGING — DX DG ANKLE COMPLETE 3+V*R*
3 series · 3 of 3 positions shown · non-contrast
Comparison: 09/17/2015

CLINICAL DATA: Followup of right ankle fracture.

EXAM:
RIGHT ANKLE - COMPLETE 3+ VIEW

[ankle ap]
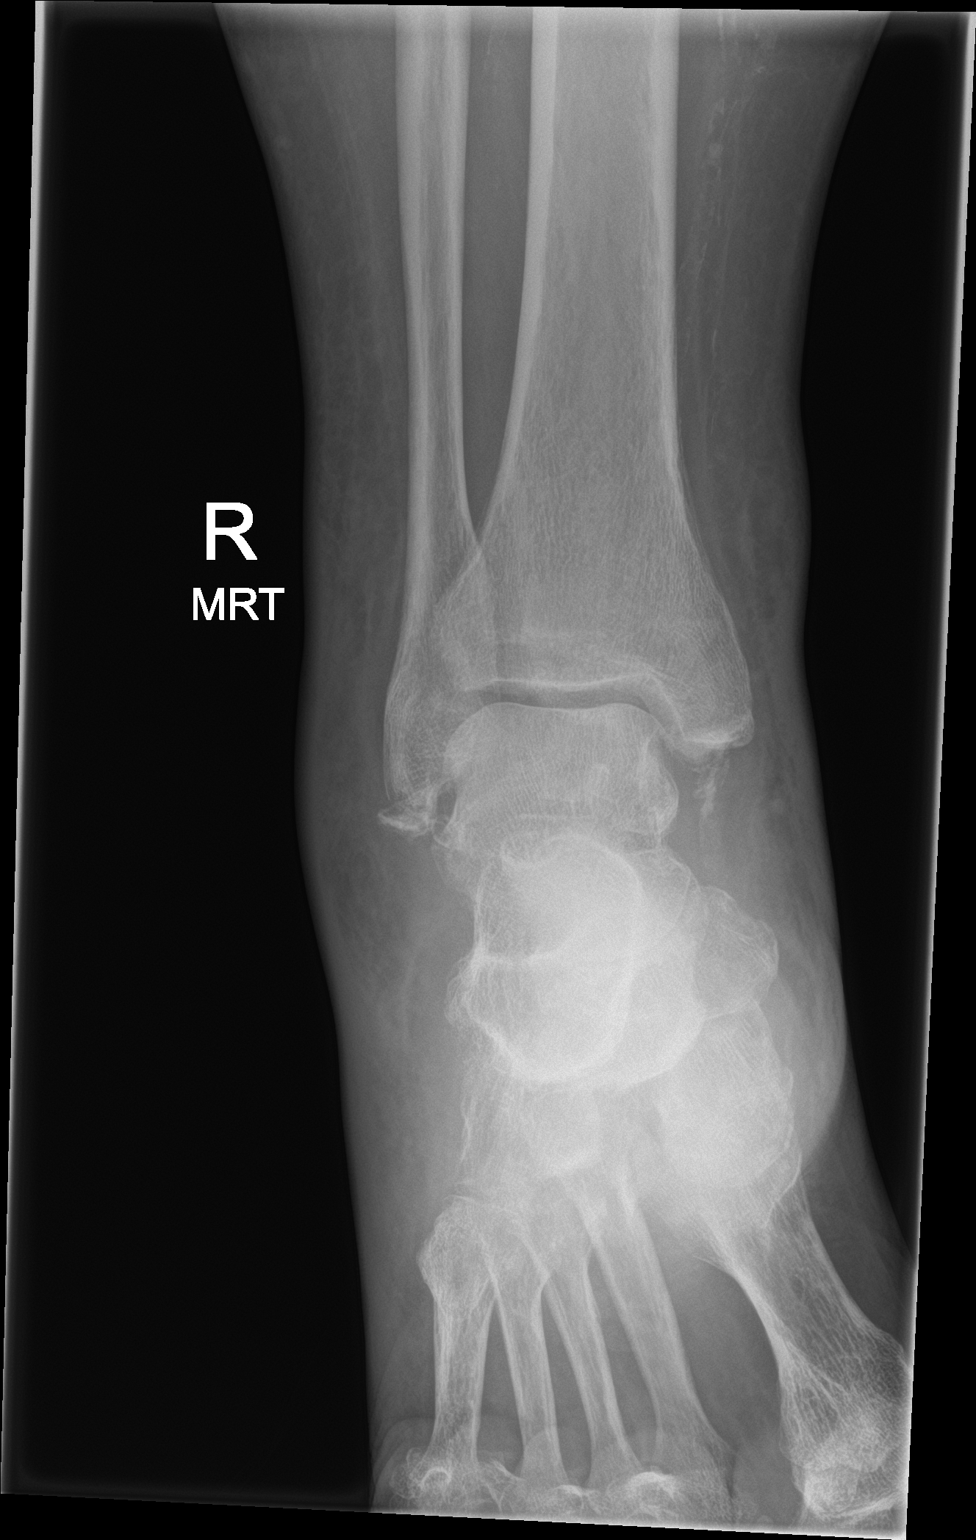

[ankle obl]
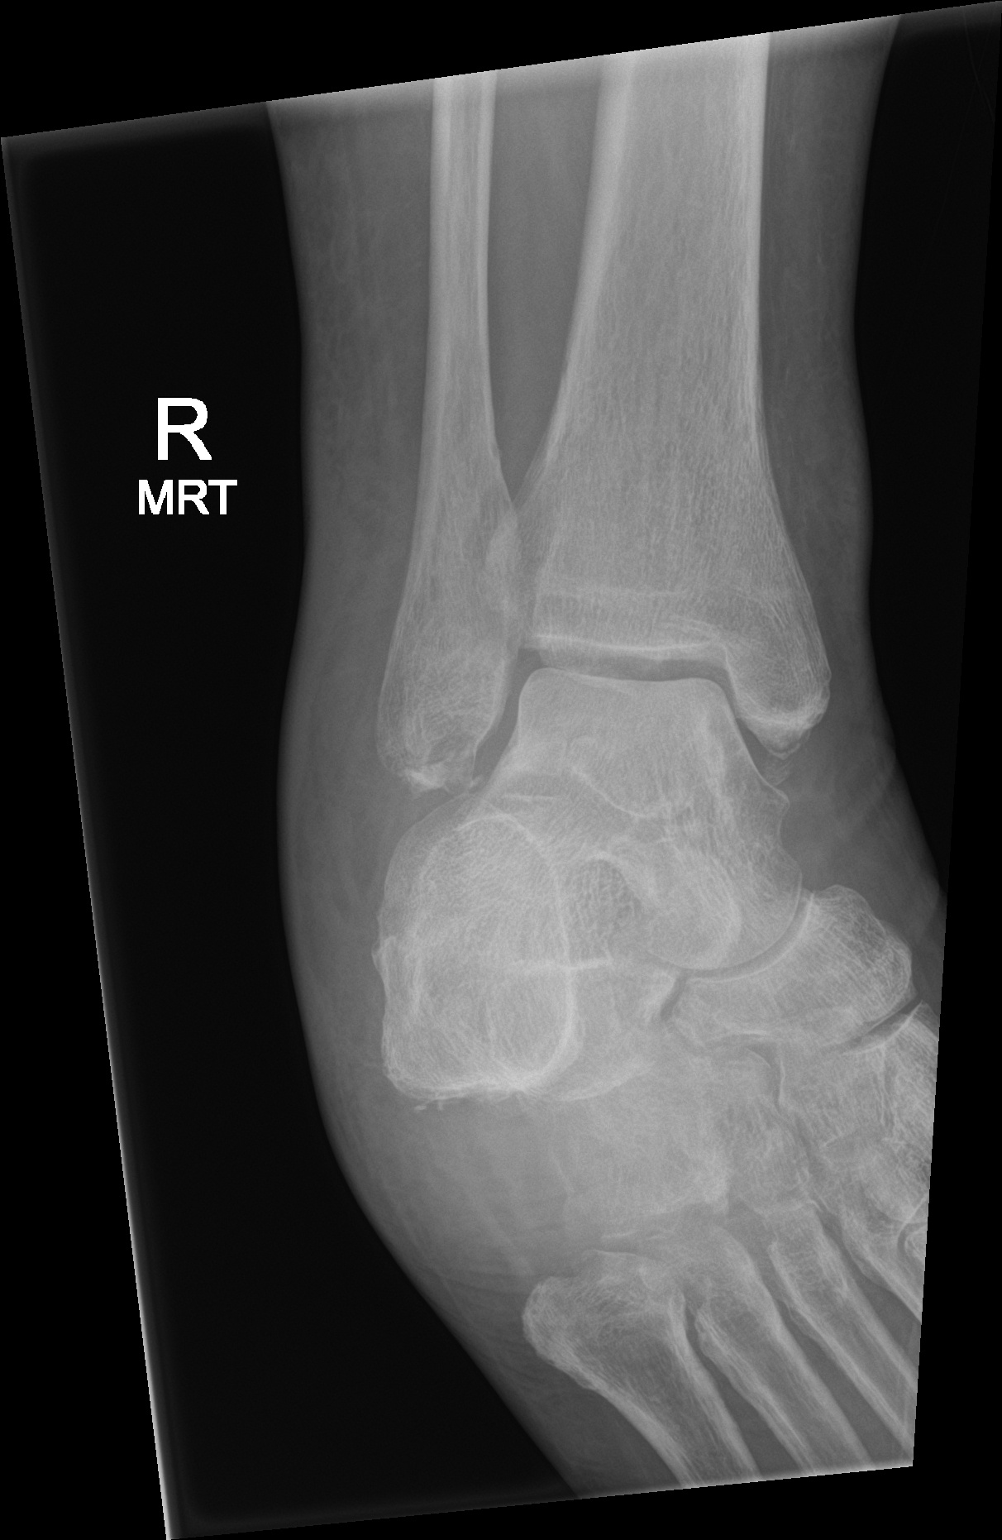

[ankle lat]
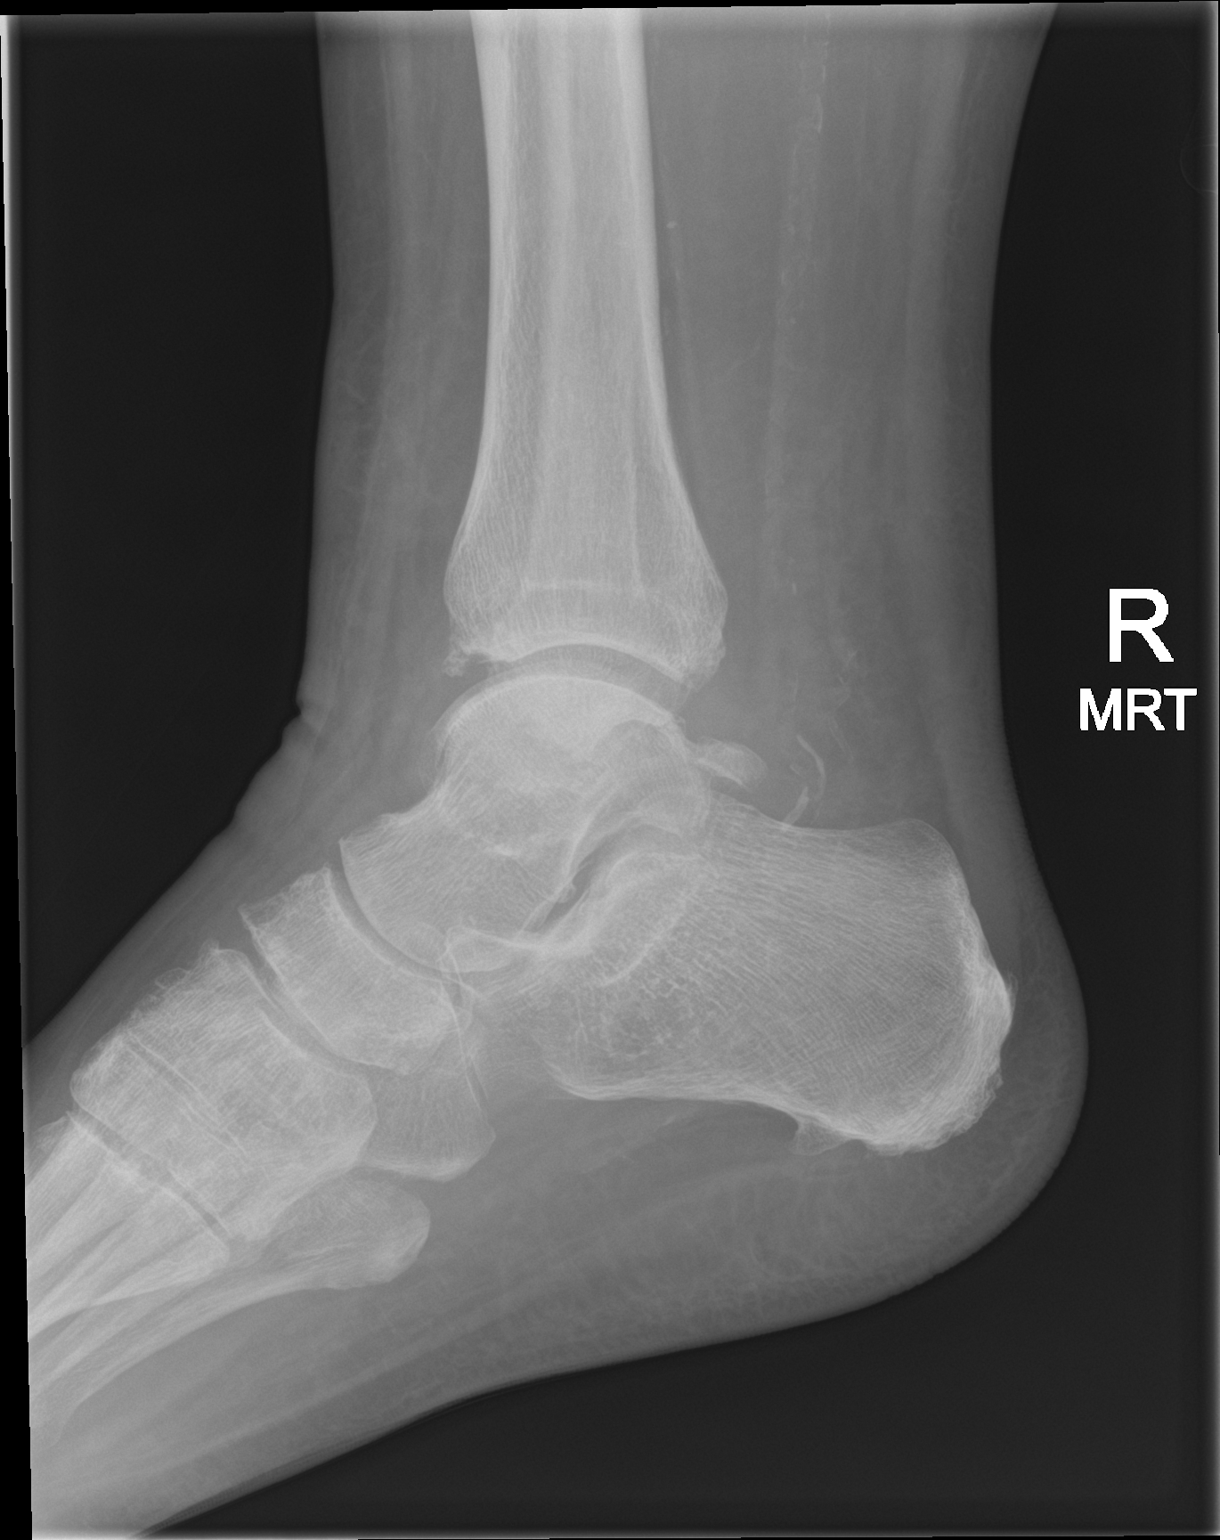

[3 of 3 positions shown; findings below may reference images not displayed]

FINDINGS: diffuse soft tissue swelling is increased. No significant healing
across the previously described distal fibular fracture.

Base of fifth metatarsal and talar dome intact. Vascular
calcifications. Tibiotalar osteoarthritis. Possible remote medial
malleolar avulsion fracture.
IMPRESSION: No significant healing of distal fibular fracture since 09/17/2015.

Increase in diffuse soft tissue swelling.

## 2017-01-24 ENCOUNTER — Ambulatory Visit (INDEPENDENT_AMBULATORY_CARE_PROVIDER_SITE_OTHER): Payer: Medicare Other | Admitting: Internal Medicine

## 2017-01-24 DIAGNOSIS — M5136 Other intervertebral disc degeneration, lumbar region: Secondary | ICD-10-CM | POA: Diagnosis not present

## 2017-01-24 DIAGNOSIS — M25372 Other instability, left ankle: Secondary | ICD-10-CM | POA: Diagnosis not present

## 2017-01-24 DIAGNOSIS — R2689 Other abnormalities of gait and mobility: Secondary | ICD-10-CM | POA: Diagnosis not present

## 2017-01-24 DIAGNOSIS — M4716 Other spondylosis with myelopathy, lumbar region: Secondary | ICD-10-CM | POA: Diagnosis not present

## 2017-01-24 DIAGNOSIS — Z23 Encounter for immunization: Secondary | ICD-10-CM

## 2017-01-24 NOTE — Progress Notes (Signed)
Flu vaccine given.

## 2017-01-24 NOTE — Patient Instructions (Signed)
Flu vaccine given.

## 2017-01-27 DIAGNOSIS — R2689 Other abnormalities of gait and mobility: Secondary | ICD-10-CM | POA: Diagnosis not present

## 2017-01-27 DIAGNOSIS — M25372 Other instability, left ankle: Secondary | ICD-10-CM | POA: Diagnosis not present

## 2017-01-27 DIAGNOSIS — M5136 Other intervertebral disc degeneration, lumbar region: Secondary | ICD-10-CM | POA: Diagnosis not present

## 2017-01-27 DIAGNOSIS — M4716 Other spondylosis with myelopathy, lumbar region: Secondary | ICD-10-CM | POA: Diagnosis not present

## 2017-02-01 DIAGNOSIS — M5136 Other intervertebral disc degeneration, lumbar region: Secondary | ICD-10-CM | POA: Diagnosis not present

## 2017-02-01 DIAGNOSIS — R2689 Other abnormalities of gait and mobility: Secondary | ICD-10-CM | POA: Diagnosis not present

## 2017-02-01 DIAGNOSIS — M25372 Other instability, left ankle: Secondary | ICD-10-CM | POA: Diagnosis not present

## 2017-02-01 DIAGNOSIS — M4716 Other spondylosis with myelopathy, lumbar region: Secondary | ICD-10-CM | POA: Diagnosis not present

## 2017-02-04 DIAGNOSIS — M5136 Other intervertebral disc degeneration, lumbar region: Secondary | ICD-10-CM | POA: Diagnosis not present

## 2017-02-04 DIAGNOSIS — M25372 Other instability, left ankle: Secondary | ICD-10-CM | POA: Diagnosis not present

## 2017-02-04 DIAGNOSIS — M4716 Other spondylosis with myelopathy, lumbar region: Secondary | ICD-10-CM | POA: Diagnosis not present

## 2017-02-04 DIAGNOSIS — R2689 Other abnormalities of gait and mobility: Secondary | ICD-10-CM | POA: Diagnosis not present

## 2017-02-07 DIAGNOSIS — R2689 Other abnormalities of gait and mobility: Secondary | ICD-10-CM | POA: Diagnosis not present

## 2017-02-07 DIAGNOSIS — M5136 Other intervertebral disc degeneration, lumbar region: Secondary | ICD-10-CM | POA: Diagnosis not present

## 2017-02-07 DIAGNOSIS — M4716 Other spondylosis with myelopathy, lumbar region: Secondary | ICD-10-CM | POA: Diagnosis not present

## 2017-02-07 DIAGNOSIS — M25372 Other instability, left ankle: Secondary | ICD-10-CM | POA: Diagnosis not present

## 2017-02-10 DIAGNOSIS — M4716 Other spondylosis with myelopathy, lumbar region: Secondary | ICD-10-CM | POA: Diagnosis not present

## 2017-02-10 DIAGNOSIS — R2689 Other abnormalities of gait and mobility: Secondary | ICD-10-CM | POA: Diagnosis not present

## 2017-02-10 DIAGNOSIS — M25372 Other instability, left ankle: Secondary | ICD-10-CM | POA: Diagnosis not present

## 2017-02-10 DIAGNOSIS — M5136 Other intervertebral disc degeneration, lumbar region: Secondary | ICD-10-CM | POA: Diagnosis not present

## 2017-02-14 DIAGNOSIS — M25372 Other instability, left ankle: Secondary | ICD-10-CM | POA: Diagnosis not present

## 2017-02-14 DIAGNOSIS — M4716 Other spondylosis with myelopathy, lumbar region: Secondary | ICD-10-CM | POA: Diagnosis not present

## 2017-02-14 DIAGNOSIS — R2689 Other abnormalities of gait and mobility: Secondary | ICD-10-CM | POA: Diagnosis not present

## 2017-02-14 DIAGNOSIS — M5136 Other intervertebral disc degeneration, lumbar region: Secondary | ICD-10-CM | POA: Diagnosis not present

## 2017-02-17 DIAGNOSIS — R2689 Other abnormalities of gait and mobility: Secondary | ICD-10-CM | POA: Diagnosis not present

## 2017-02-17 DIAGNOSIS — M4716 Other spondylosis with myelopathy, lumbar region: Secondary | ICD-10-CM | POA: Diagnosis not present

## 2017-02-17 DIAGNOSIS — M5136 Other intervertebral disc degeneration, lumbar region: Secondary | ICD-10-CM | POA: Diagnosis not present

## 2017-02-17 DIAGNOSIS — M25372 Other instability, left ankle: Secondary | ICD-10-CM | POA: Diagnosis not present

## 2017-02-21 DIAGNOSIS — M5136 Other intervertebral disc degeneration, lumbar region: Secondary | ICD-10-CM | POA: Diagnosis not present

## 2017-02-21 DIAGNOSIS — R2689 Other abnormalities of gait and mobility: Secondary | ICD-10-CM | POA: Diagnosis not present

## 2017-02-21 DIAGNOSIS — M25372 Other instability, left ankle: Secondary | ICD-10-CM | POA: Diagnosis not present

## 2017-02-21 DIAGNOSIS — M4716 Other spondylosis with myelopathy, lumbar region: Secondary | ICD-10-CM | POA: Diagnosis not present

## 2017-02-22 ENCOUNTER — Other Ambulatory Visit: Payer: Medicare Other | Admitting: Internal Medicine

## 2017-02-22 DIAGNOSIS — E785 Hyperlipidemia, unspecified: Secondary | ICD-10-CM | POA: Diagnosis not present

## 2017-02-23 LAB — HEPATIC FUNCTION PANEL
AG Ratio: 1.6 (calc) (ref 1.0–2.5)
ALT: 9 U/L (ref 9–46)
AST: 14 U/L (ref 10–35)
Albumin: 3.9 g/dL (ref 3.6–5.1)
Alkaline phosphatase (APISO): 123 U/L — ABNORMAL HIGH (ref 40–115)
Bilirubin, Direct: 0.1 mg/dL (ref 0.0–0.2)
Globulin: 2.5 g/dL (calc) (ref 1.9–3.7)
Indirect Bilirubin: 0.5 mg/dL (calc) (ref 0.2–1.2)
Total Bilirubin: 0.6 mg/dL (ref 0.2–1.2)
Total Protein: 6.4 g/dL (ref 6.1–8.1)

## 2017-02-23 LAB — LIPID PANEL
Cholesterol: 179 mg/dL (ref <200)
HDL: 41 mg/dL (ref >40)
LDL Cholesterol (Calc): 114 mg/dL (calc) — ABNORMAL HIGH
Non-HDL Cholesterol (Calc): 138 mg/dL (calc) — ABNORMAL HIGH (ref <130)
Total CHOL/HDL Ratio: 4.4 (calc) (ref <5.0)
Triglycerides: 126 mg/dL (ref <150)

## 2017-02-23 LAB — MICROALBUMIN / CREATININE URINE RATIO
Creatinine, Urine: 108 mg/dL (ref 20–320)
Microalb Creat Ratio: 6 mcg/mg creat (ref <30)
Microalb, Ur: 0.7 mg/dL

## 2017-02-24 ENCOUNTER — Ambulatory Visit (INDEPENDENT_AMBULATORY_CARE_PROVIDER_SITE_OTHER): Payer: Medicare Other | Admitting: Internal Medicine

## 2017-02-24 ENCOUNTER — Encounter: Payer: Self-pay | Admitting: Internal Medicine

## 2017-02-24 VITALS — BP 104/62 | HR 102 | Temp 98.6°F | Wt 293.0 lb

## 2017-02-24 DIAGNOSIS — M21371 Foot drop, right foot: Secondary | ICD-10-CM | POA: Diagnosis not present

## 2017-02-24 DIAGNOSIS — K219 Gastro-esophageal reflux disease without esophagitis: Secondary | ICD-10-CM

## 2017-02-24 DIAGNOSIS — I10 Essential (primary) hypertension: Secondary | ICD-10-CM | POA: Diagnosis not present

## 2017-02-24 DIAGNOSIS — M5432 Sciatica, left side: Secondary | ICD-10-CM

## 2017-02-24 DIAGNOSIS — R2689 Other abnormalities of gait and mobility: Secondary | ICD-10-CM | POA: Diagnosis not present

## 2017-02-24 DIAGNOSIS — R413 Other amnesia: Secondary | ICD-10-CM | POA: Diagnosis not present

## 2017-02-24 DIAGNOSIS — M5136 Other intervertebral disc degeneration, lumbar region: Secondary | ICD-10-CM | POA: Diagnosis not present

## 2017-02-24 DIAGNOSIS — M25372 Other instability, left ankle: Secondary | ICD-10-CM | POA: Diagnosis not present

## 2017-02-24 DIAGNOSIS — M4716 Other spondylosis with myelopathy, lumbar region: Secondary | ICD-10-CM | POA: Diagnosis not present

## 2017-02-24 MED ORDER — AMLODIPINE BESYLATE 5 MG PO TABS: 5.0000 mg | ORAL_TABLET | Freq: Every day | ORAL | 11 refills | Status: DC

## 2017-02-24 MED ORDER — FUROSEMIDE 40 MG PO TABS: ORAL_TABLET | ORAL | 5 refills | Status: DC

## 2017-02-24 MED ORDER — BISOPROLOL FUMARATE 5 MG PO TABS: 5.0000 mg | ORAL_TABLET | Freq: Every day | ORAL | 11 refills | Status: DC

## 2017-02-24 MED ORDER — PANTOPRAZOLE SODIUM 40 MG PO TBEC: 40.0000 mg | DELAYED_RELEASE_TABLET | Freq: Every day | ORAL | 3 refills | Status: DC

## 2017-02-24 MED ORDER — PREDNISONE 10 MG PO TABS: ORAL_TABLET | ORAL | 0 refills | Status: DC

## 2017-02-24 NOTE — Progress Notes (Signed)
   Subjective:    Patient ID: BINH DOTEN, male    DOB: 08-Aug-1934, 82 y.o.   MRN: [id number]  HPI 82 year old Male for 6 month follow up. Hx HTN, mild memory loss seen by Dr. Jannifer [name], coronary disease, foot drop, has developed pain left lower back radiating into left leg.  He underwent surgery by Dr. Vertell Limber July 2017 involving L4-5-L5-S1 redo/decompression fusion L2-L3, laminectomy with right microdiscectomy L2-S1.  He continues to go to physical therapy grams per physical therapy.  Suffered a bimalleolar fracture right ankle in a fall near my office May 2017.  At last visit, it seems that his blood pressure was elevated at neurosurgeon's office.  We found out that for some reason his wife inadvertently discontinued his amlodipine.  He is now here for follow-up of hypertension and blood pressure is excellent today.  He is morbidly obese weighing 293 pounds.    Review of Systems see above his wife is anxious about his health and wants to do all she can for him.  She will not allow her daughter to hire some help and give herself a break.  He struggles some with ambulation I think because of the foot drop and back pain.  He is high risk for falling     Objective:   Physical Exam Neck is supple.  Chest clear.  Cardiac exam regular rate and rhythm normal S1-S2.  Trace pitting edema of the lower extremities       Assessment & Plan:  Chronic back pain-continue PT and take prednisone taper over 12 days  Foot drop- not sure if another brace would help  Essential hypertension stable on current regimen  History of coronary disease  Sciatica left  Morbid obesity  Memory loss continue oral B12  Trial of prednisone over 12 days and tapering course starting with 60 mg decreasing to 0 mg over 12 days.  Will be interesting to see if back pain improved with this regimen.  Continue current antihypertensive medications.  Stop Mobic while he is taking prednisone then restarted.  Continue physical  therapy.  Return in 6 months for physical exam.

## 2017-02-28 DIAGNOSIS — M5136 Other intervertebral disc degeneration, lumbar region: Secondary | ICD-10-CM | POA: Diagnosis not present

## 2017-02-28 DIAGNOSIS — M25372 Other instability, left ankle: Secondary | ICD-10-CM | POA: Diagnosis not present

## 2017-02-28 DIAGNOSIS — M4716 Other spondylosis with myelopathy, lumbar region: Secondary | ICD-10-CM | POA: Diagnosis not present

## 2017-02-28 DIAGNOSIS — R2689 Other abnormalities of gait and mobility: Secondary | ICD-10-CM | POA: Diagnosis not present

## 2017-03-03 DIAGNOSIS — R2689 Other abnormalities of gait and mobility: Secondary | ICD-10-CM | POA: Diagnosis not present

## 2017-03-03 DIAGNOSIS — M4716 Other spondylosis with myelopathy, lumbar region: Secondary | ICD-10-CM | POA: Diagnosis not present

## 2017-03-03 DIAGNOSIS — M25372 Other instability, left ankle: Secondary | ICD-10-CM | POA: Diagnosis not present

## 2017-03-03 DIAGNOSIS — M5136 Other intervertebral disc degeneration, lumbar region: Secondary | ICD-10-CM | POA: Diagnosis not present

## 2017-03-07 DIAGNOSIS — M4716 Other spondylosis with myelopathy, lumbar region: Secondary | ICD-10-CM | POA: Diagnosis not present

## 2017-03-07 DIAGNOSIS — R2689 Other abnormalities of gait and mobility: Secondary | ICD-10-CM | POA: Diagnosis not present

## 2017-03-07 DIAGNOSIS — M25372 Other instability, left ankle: Secondary | ICD-10-CM | POA: Diagnosis not present

## 2017-03-07 DIAGNOSIS — M5136 Other intervertebral disc degeneration, lumbar region: Secondary | ICD-10-CM | POA: Diagnosis not present

## 2017-03-09 DIAGNOSIS — M25372 Other instability, left ankle: Secondary | ICD-10-CM | POA: Diagnosis not present

## 2017-03-09 DIAGNOSIS — R2689 Other abnormalities of gait and mobility: Secondary | ICD-10-CM | POA: Diagnosis not present

## 2017-03-09 DIAGNOSIS — M5136 Other intervertebral disc degeneration, lumbar region: Secondary | ICD-10-CM | POA: Diagnosis not present

## 2017-03-09 DIAGNOSIS — M4716 Other spondylosis with myelopathy, lumbar region: Secondary | ICD-10-CM | POA: Diagnosis not present

## 2017-03-14 DIAGNOSIS — R2689 Other abnormalities of gait and mobility: Secondary | ICD-10-CM | POA: Diagnosis not present

## 2017-03-14 DIAGNOSIS — M4716 Other spondylosis with myelopathy, lumbar region: Secondary | ICD-10-CM | POA: Diagnosis not present

## 2017-03-14 DIAGNOSIS — M25372 Other instability, left ankle: Secondary | ICD-10-CM | POA: Diagnosis not present

## 2017-03-14 DIAGNOSIS — M5136 Other intervertebral disc degeneration, lumbar region: Secondary | ICD-10-CM | POA: Diagnosis not present

## 2017-03-17 DIAGNOSIS — M25372 Other instability, left ankle: Secondary | ICD-10-CM | POA: Diagnosis not present

## 2017-03-17 DIAGNOSIS — M4716 Other spondylosis with myelopathy, lumbar region: Secondary | ICD-10-CM | POA: Diagnosis not present

## 2017-03-17 DIAGNOSIS — M5136 Other intervertebral disc degeneration, lumbar region: Secondary | ICD-10-CM | POA: Diagnosis not present

## 2017-03-17 DIAGNOSIS — R2689 Other abnormalities of gait and mobility: Secondary | ICD-10-CM | POA: Diagnosis not present

## 2017-03-18 NOTE — Patient Instructions (Signed)
Take prednisone taper for 12 days.  Stop meloxicam while taking that.  Continue physical therapy.  Continue same medications and return in 6 months for physical exam.

## 2017-03-21 DIAGNOSIS — R2689 Other abnormalities of gait and mobility: Secondary | ICD-10-CM | POA: Diagnosis not present

## 2017-03-21 DIAGNOSIS — M5136 Other intervertebral disc degeneration, lumbar region: Secondary | ICD-10-CM | POA: Diagnosis not present

## 2017-03-21 DIAGNOSIS — M25372 Other instability, left ankle: Secondary | ICD-10-CM | POA: Diagnosis not present

## 2017-03-21 DIAGNOSIS — M4716 Other spondylosis with myelopathy, lumbar region: Secondary | ICD-10-CM | POA: Diagnosis not present

## 2017-03-25 DIAGNOSIS — M25372 Other instability, left ankle: Secondary | ICD-10-CM | POA: Diagnosis not present

## 2017-03-25 DIAGNOSIS — M4716 Other spondylosis with myelopathy, lumbar region: Secondary | ICD-10-CM | POA: Diagnosis not present

## 2017-03-25 DIAGNOSIS — M5136 Other intervertebral disc degeneration, lumbar region: Secondary | ICD-10-CM | POA: Diagnosis not present

## 2017-03-25 DIAGNOSIS — R2689 Other abnormalities of gait and mobility: Secondary | ICD-10-CM | POA: Diagnosis not present

## 2017-03-30 DIAGNOSIS — M5136 Other intervertebral disc degeneration, lumbar region: Secondary | ICD-10-CM | POA: Diagnosis not present

## 2017-03-30 DIAGNOSIS — M25372 Other instability, left ankle: Secondary | ICD-10-CM | POA: Diagnosis not present

## 2017-03-30 DIAGNOSIS — R2689 Other abnormalities of gait and mobility: Secondary | ICD-10-CM | POA: Diagnosis not present

## 2017-03-30 DIAGNOSIS — M4716 Other spondylosis with myelopathy, lumbar region: Secondary | ICD-10-CM | POA: Diagnosis not present

## 2017-04-01 DIAGNOSIS — M4716 Other spondylosis with myelopathy, lumbar region: Secondary | ICD-10-CM | POA: Diagnosis not present

## 2017-04-01 DIAGNOSIS — R2689 Other abnormalities of gait and mobility: Secondary | ICD-10-CM | POA: Diagnosis not present

## 2017-04-01 DIAGNOSIS — M25372 Other instability, left ankle: Secondary | ICD-10-CM | POA: Diagnosis not present

## 2017-04-01 DIAGNOSIS — M5136 Other intervertebral disc degeneration, lumbar region: Secondary | ICD-10-CM | POA: Diagnosis not present

## 2017-04-05 DIAGNOSIS — M5136 Other intervertebral disc degeneration, lumbar region: Secondary | ICD-10-CM | POA: Diagnosis not present

## 2017-04-05 DIAGNOSIS — M4716 Other spondylosis with myelopathy, lumbar region: Secondary | ICD-10-CM | POA: Diagnosis not present

## 2017-04-05 DIAGNOSIS — R2689 Other abnormalities of gait and mobility: Secondary | ICD-10-CM | POA: Diagnosis not present

## 2017-04-05 DIAGNOSIS — M25372 Other instability, left ankle: Secondary | ICD-10-CM | POA: Diagnosis not present

## 2017-04-08 DIAGNOSIS — M25372 Other instability, left ankle: Secondary | ICD-10-CM | POA: Diagnosis not present

## 2017-04-08 DIAGNOSIS — M5136 Other intervertebral disc degeneration, lumbar region: Secondary | ICD-10-CM | POA: Diagnosis not present

## 2017-04-08 DIAGNOSIS — R2689 Other abnormalities of gait and mobility: Secondary | ICD-10-CM | POA: Diagnosis not present

## 2017-04-08 DIAGNOSIS — M4716 Other spondylosis with myelopathy, lumbar region: Secondary | ICD-10-CM | POA: Diagnosis not present

## 2017-04-13 DIAGNOSIS — R2689 Other abnormalities of gait and mobility: Secondary | ICD-10-CM | POA: Diagnosis not present

## 2017-04-13 DIAGNOSIS — M25372 Other instability, left ankle: Secondary | ICD-10-CM | POA: Diagnosis not present

## 2017-04-13 DIAGNOSIS — M4716 Other spondylosis with myelopathy, lumbar region: Secondary | ICD-10-CM | POA: Diagnosis not present

## 2017-04-13 DIAGNOSIS — M5136 Other intervertebral disc degeneration, lumbar region: Secondary | ICD-10-CM | POA: Diagnosis not present

## 2017-04-15 DIAGNOSIS — M5136 Other intervertebral disc degeneration, lumbar region: Secondary | ICD-10-CM | POA: Diagnosis not present

## 2017-04-15 DIAGNOSIS — M4716 Other spondylosis with myelopathy, lumbar region: Secondary | ICD-10-CM | POA: Diagnosis not present

## 2017-04-15 DIAGNOSIS — R2689 Other abnormalities of gait and mobility: Secondary | ICD-10-CM | POA: Diagnosis not present

## 2017-04-15 DIAGNOSIS — M25372 Other instability, left ankle: Secondary | ICD-10-CM | POA: Diagnosis not present

## 2017-04-21 DIAGNOSIS — R2689 Other abnormalities of gait and mobility: Secondary | ICD-10-CM | POA: Diagnosis not present

## 2017-04-21 DIAGNOSIS — M4716 Other spondylosis with myelopathy, lumbar region: Secondary | ICD-10-CM | POA: Diagnosis not present

## 2017-04-21 DIAGNOSIS — M5136 Other intervertebral disc degeneration, lumbar region: Secondary | ICD-10-CM | POA: Diagnosis not present

## 2017-04-21 DIAGNOSIS — M25372 Other instability, left ankle: Secondary | ICD-10-CM | POA: Diagnosis not present

## 2017-04-26 DIAGNOSIS — M4716 Other spondylosis with myelopathy, lumbar region: Secondary | ICD-10-CM | POA: Diagnosis not present

## 2017-04-26 DIAGNOSIS — R2689 Other abnormalities of gait and mobility: Secondary | ICD-10-CM | POA: Diagnosis not present

## 2017-04-26 DIAGNOSIS — M5136 Other intervertebral disc degeneration, lumbar region: Secondary | ICD-10-CM | POA: Diagnosis not present

## 2017-04-26 DIAGNOSIS — M25372 Other instability, left ankle: Secondary | ICD-10-CM | POA: Diagnosis not present

## 2017-04-28 DIAGNOSIS — M4716 Other spondylosis with myelopathy, lumbar region: Secondary | ICD-10-CM | POA: Diagnosis not present

## 2017-04-28 DIAGNOSIS — M25372 Other instability, left ankle: Secondary | ICD-10-CM | POA: Diagnosis not present

## 2017-04-28 DIAGNOSIS — R2689 Other abnormalities of gait and mobility: Secondary | ICD-10-CM | POA: Diagnosis not present

## 2017-04-28 DIAGNOSIS — M5136 Other intervertebral disc degeneration, lumbar region: Secondary | ICD-10-CM | POA: Diagnosis not present

## 2017-05-02 DIAGNOSIS — M25372 Other instability, left ankle: Secondary | ICD-10-CM | POA: Diagnosis not present

## 2017-05-02 DIAGNOSIS — R2689 Other abnormalities of gait and mobility: Secondary | ICD-10-CM | POA: Diagnosis not present

## 2017-05-02 DIAGNOSIS — M5136 Other intervertebral disc degeneration, lumbar region: Secondary | ICD-10-CM | POA: Diagnosis not present

## 2017-05-02 DIAGNOSIS — M4716 Other spondylosis with myelopathy, lumbar region: Secondary | ICD-10-CM | POA: Diagnosis not present

## 2017-05-05 DIAGNOSIS — M4716 Other spondylosis with myelopathy, lumbar region: Secondary | ICD-10-CM | POA: Diagnosis not present

## 2017-05-05 DIAGNOSIS — M5136 Other intervertebral disc degeneration, lumbar region: Secondary | ICD-10-CM | POA: Diagnosis not present

## 2017-05-05 DIAGNOSIS — M25372 Other instability, left ankle: Secondary | ICD-10-CM | POA: Diagnosis not present

## 2017-05-05 DIAGNOSIS — R2689 Other abnormalities of gait and mobility: Secondary | ICD-10-CM | POA: Diagnosis not present

## 2017-05-09 DIAGNOSIS — M4716 Other spondylosis with myelopathy, lumbar region: Secondary | ICD-10-CM | POA: Diagnosis not present

## 2017-05-09 DIAGNOSIS — M5136 Other intervertebral disc degeneration, lumbar region: Secondary | ICD-10-CM | POA: Diagnosis not present

## 2017-05-09 DIAGNOSIS — M25372 Other instability, left ankle: Secondary | ICD-10-CM | POA: Diagnosis not present

## 2017-05-09 DIAGNOSIS — R2689 Other abnormalities of gait and mobility: Secondary | ICD-10-CM | POA: Diagnosis not present

## 2017-05-12 DIAGNOSIS — R2689 Other abnormalities of gait and mobility: Secondary | ICD-10-CM | POA: Diagnosis not present

## 2017-05-12 DIAGNOSIS — M4716 Other spondylosis with myelopathy, lumbar region: Secondary | ICD-10-CM | POA: Diagnosis not present

## 2017-05-12 DIAGNOSIS — M25372 Other instability, left ankle: Secondary | ICD-10-CM | POA: Diagnosis not present

## 2017-05-12 DIAGNOSIS — M5136 Other intervertebral disc degeneration, lumbar region: Secondary | ICD-10-CM | POA: Diagnosis not present

## 2017-05-16 DIAGNOSIS — M5136 Other intervertebral disc degeneration, lumbar region: Secondary | ICD-10-CM | POA: Diagnosis not present

## 2017-05-16 DIAGNOSIS — R2689 Other abnormalities of gait and mobility: Secondary | ICD-10-CM | POA: Diagnosis not present

## 2017-05-16 DIAGNOSIS — M25372 Other instability, left ankle: Secondary | ICD-10-CM | POA: Diagnosis not present

## 2017-05-16 DIAGNOSIS — M4716 Other spondylosis with myelopathy, lumbar region: Secondary | ICD-10-CM | POA: Diagnosis not present

## 2017-05-18 DIAGNOSIS — M5136 Other intervertebral disc degeneration, lumbar region: Secondary | ICD-10-CM | POA: Diagnosis not present

## 2017-05-18 DIAGNOSIS — R2689 Other abnormalities of gait and mobility: Secondary | ICD-10-CM | POA: Diagnosis not present

## 2017-05-18 DIAGNOSIS — M25372 Other instability, left ankle: Secondary | ICD-10-CM | POA: Diagnosis not present

## 2017-05-18 DIAGNOSIS — M4716 Other spondylosis with myelopathy, lumbar region: Secondary | ICD-10-CM | POA: Diagnosis not present

## 2017-05-19 ENCOUNTER — Ambulatory Visit (INDEPENDENT_AMBULATORY_CARE_PROVIDER_SITE_OTHER): Payer: Medicare Other | Admitting: Neurology

## 2017-05-19 ENCOUNTER — Encounter: Payer: Self-pay | Admitting: Neurology

## 2017-05-19 VITALS — BP 147/97 | HR 102 | Ht 74.0 in | Wt 317.0 lb

## 2017-05-19 DIAGNOSIS — R413 Other amnesia: Secondary | ICD-10-CM

## 2017-05-19 DIAGNOSIS — M48062 Spinal stenosis, lumbar region with neurogenic claudication: Secondary | ICD-10-CM

## 2017-05-19 MED ORDER — DONEPEZIL HCL 5 MG PO TABS: 5.0000 mg | ORAL_TABLET | Freq: Every day | ORAL | 1 refills | Status: DC

## 2017-05-19 NOTE — Progress Notes (Signed)
Reason for visit: Memory disorder  Mitchell [name] is an 83 y.o. male  History of present illness:  Mitchell [name] is an 83 year old right-handed white male with a history of a memory disorder.  The patient has been on B12 supplementation.  He has undergone MRI of the brain that shows a moderate level of small vessel ischemic changes.  Some degree of cortical atrophy is also seen.  The patient has been relatively stable with his memory since last seen.  He does have days where he is more confused than others.  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  The patient is having a lot of problems with low back pain and leg weakness.  He walks with a walker.  He is getting physical therapy for this.  The back issue prevents him from walking long distances.  The patient has been on Cymbalta in the past that resulted in problems with memory and confusion.  He takes meloxicam and Tylenol for pain now.  He returns for an evaluation.  Past Medical History:  Diagnosis Date  . Acute on chronic systolic heart failure, usually asymptomatic but with tachycardia HF became acute now resolved 05/26/2011  . Adenomatous polyp   . Ankle fracture    right  . Anxiety   . Arthritis   . Asthma    Symbicort daily as needed  . Atrial flutter with rapid ventricular response ([place]) 05/25/2011  . CHF, acute, mild secondary to EF 40-45% an a. flutter with RVR 05/25/2011   takes Furosemide daily as needed  . Constipation    takes Miralax daily as needed  . Coronary artery disease    2D ECHO, 08/24/2011 - EF >55%; NUCLEAR STRESS TEST, 04/21/2010 - mild ischemia in Basal Inferolateral and Mid Inferolateral regions, post-stress EF 56%,   . Depression    takes Cymbalta daily  . Dysrhythmia    atrial fibrilation  . GERD (gastroesophageal reflux disease)    takes Pantoprazole daily  . History of blood transfusion 53  yrs ago   got hives  . Hyperlipidemia    takes Zetia daily  . Hypertension    takes  Amlodipine and Atenolol daily  . LV dysfunction, EF by ECHO 40-45% 04/29/11 05/25/2011  . Melanoma ([place])    l neck  . Pneumonia   . Prostatitis   . Shortness of breath   . Sleep apnea    no longer has since loss of 70 pounds  . SOB (shortness of breath), secondary to SOB 05/25/2011  . Spinal stenosis     Past Surgical History:  Procedure Laterality Date  . APPENDECTOMY    . BACK SURGERY    . CARDIAC CATHETERIZATION  03/18/2000   PDA between the first PLA branch stented with a 3x8 Guidant Penta stent resulting in reduction of a 99% stenosis to 0% residual  . CARDIOVERSION  05/26/2011   Procedure: CARDIOVERSION;  Surgeon: Sanda Klein, MD;  Location: [facility];  Service: Cardiovascular;  Laterality: N/A;  . CORONARY ANGIOPLASTY    . HERNIA REPAIR     umbilical  . JOINT REPLACEMENT     left knee  . JOINT REPLACEMENT     left hip  . knee replacements Bilateral   . LUMBAR LAMINECTOMY/DECOMPRESSION MICRODISCECTOMY    . ROTATOR CUFF REPAIR Left   . TEE WITHOUT CARDIOVERSION  05/26/2011   Procedure: TRANSESOPHAGEAL ECHOCARDIOGRAM (TEE);  Surgeon: Sanda Klein, MD;  Location: [facility];  Service: Cardiovascular;  Laterality: N/A;    Family History  Problem Relation Age of Onset  . Heart failure Mother 76  . Heart failure Father 29  . Cancer Paternal Grandfather     Social history:  reports that  has never smoked. He quit smokeless tobacco use about 44 years ago. His smokeless tobacco use included chew. He reports that he does not drink alcohol or use drugs.    Allergies  Allergen Reactions  . Statins Other (See Comments)    Leg pain  . Adhesive [Tape] Itching and Rash    Please use "paper" tape    Medications:  Prior to Admission medications   Medication Sig Start Date End Date Taking? Authorizing Provider  acetaminophen (TYLENOL) 325 MG tablet Take 2 tablets (650 mg total) by mouth 3 (three) times daily as needed for mild pain or moderate pain. Patient taking  differently: Take 650 mg by mouth 2 (two) times daily.  11/13/15  Yes Love, Ivan Anchors, PA-C  amLODipine (NORVASC) 5 MG tablet Take 1 tablet (5 mg total) daily by mouth. 02/24/17  Yes Elby Showers, MD  Aspirin (ASPIR-81 PO) Take 81 mg by mouth daily.   Yes [provider]  bisoprolol (ZEBETA) 5 MG tablet Take 1 tablet (5 mg total) daily by mouth. 02/24/17  Yes Baxley, Cresenciano Lick, MD  budesonide-formoterol (SYMBICORT) 80-4.5 MCG/ACT inhaler Take 2 puffs first thing in am and then another 2 puffs about 12 hours later. 02/27/16  Yes Tanda Rockers, MD  docusate sodium (COLACE) 100 MG capsule Take 1 capsule (100 mg total) by mouth 2 (two) times daily. 11/21/15  Yes Love, Ivan Anchors, PA-C  ezetimibe (ZETIA) 10 MG tablet TAKE 1 TABLET BY MOUTH ONCE DAILY. 06/18/16  Yes BaxleyCresenciano Lick, MD  furosemide (LASIX) 40 MG tablet One half tablet every other day 02/24/17  Yes Baxley, Cresenciano Lick, MD  hydrocortisone cream 1 % Apply 1 application topically daily as needed for itching.    Yes [provider]  meloxicam (MOBIC) 15 MG tablet Take 1 tablet (15 mg total) by mouth daily. 11/10/16  Yes Baxley, Cresenciano Lick, MD  OVER THE COUNTER MEDICATION Take 1 tablet by mouth daily. Vitamin B12 & Folate   Yes [provider]  pantoprazole (PROTONIX) 40 MG tablet Take 1 tablet (40 mg total) daily by mouth. 02/24/17  Yes Baxley, Cresenciano Lick, MD  polyethylene glycol (MIRALAX / GLYCOLAX) packet Take 17 g by mouth at bedtime.    Yes [provider]    ROS:  Out of a complete 14 system review of symptoms, the patient complains only of the following symptoms, and all other reviewed systems are negative.  Hearing loss Wheezing Constipation Incontinence of the bladder Joint pain, back pain, walking difficulty Memory loss, weakness  Blood pressure (!) 147/97, pulse (!) 102, height 6\' 2"  (1.88 m), weight (!) 317 lb (143.8 kg).  Physical Exam  General: The patient is alert and cooperative at the time of the  examination.  The patient is markedly obese.  Skin: 2+ edema at the ankles is seen bilaterally.  AFO brace is on the right.   Neurologic Exam  Mental status: The patient is alert and oriented x 3 at the time of the examination. The Mini-Mental status examination done today shows a total score of 23/30.  The patient is able to name 7 animals in 30 seconds.   Cranial nerves: Facial symmetry is present. Speech is normal, no aphasia or dysarthria is noted. Extraocular movements  are full. Visual fields are full.  Motor: The patient has good strength in the upper extremities.  The patient has 4-/5 strength with hip flexion bilaterally, some weakness with hamstring muscles on the left is noted.  The patient has foot drops.  Sensory examination: Soft touch sensation is symmetric on the face, arms, and legs.  Coordination: The patient has good finger-nose-finger bilaterally, he is unable to perform heel-to-shin on either side.  Gait and station: The patient has a wide-based gait, he has to use a walker for ambulation.  Tandem gait was not attempted.  Reflexes: Deep tendon reflexes are symmetric, but are depressed.    MRI brain 11/26/16:  IMPRESSION: This MRI of the brain without contrast shows the following: 1. Moderate cortical atrophy, a little more pronounced in the mesial temporal lobes. No atrophy is noted on the previous 09/15/2004 MRI. 2. Moderately advanced chronic microvascular ischemic changes also markedly progressed when compared to the 2006 MRI. 3. There are no acute findings.  * MRI scan images were reviewed online. I agree with the written report.   Assessment/Plan:  1.  Memory disturbance  2.  Low back pain, leg weakness  The patient will be placed on low-dose Aricept, the family will call for any dose adjustments.  We may consider addition of low-dose gabapentin in the future for the back pain.  The patient may also see Dr. Vertell Limber from neurosurgery, a pain  specialist in that group may be able to help him with pain management, and consider a spinal stimulator.  The patient will follow-up through this office in 6 months.  The patient has used a Lidoderm patch with some benefit.  Jill Alexanders MD 05/19/2017 1:45 PM  [facility] [address] [place], [place] [address]  Phone [phone] Fax [phone]

## 2017-05-19 NOTE — Patient Instructions (Signed)
  We will start Aricept 5 mg at night.  Begin Aricept (donepezil) at 5 mg at night for one month. If this medication is well-tolerated, please call our office and we will call in a prescription for the 10 mg tablets. Look out for side effects that may include nausea, diarrhea, weight loss, or stomach cramps. This medication will also cause a runny nose, therefore there is no need for allergy medications for this purpose.  

## 2017-05-24 DIAGNOSIS — M5136 Other intervertebral disc degeneration, lumbar region: Secondary | ICD-10-CM | POA: Diagnosis not present

## 2017-05-24 DIAGNOSIS — R2689 Other abnormalities of gait and mobility: Secondary | ICD-10-CM | POA: Diagnosis not present

## 2017-05-24 DIAGNOSIS — M4716 Other spondylosis with myelopathy, lumbar region: Secondary | ICD-10-CM | POA: Diagnosis not present

## 2017-05-24 DIAGNOSIS — M25372 Other instability, left ankle: Secondary | ICD-10-CM | POA: Diagnosis not present

## 2017-05-27 DIAGNOSIS — M4716 Other spondylosis with myelopathy, lumbar region: Secondary | ICD-10-CM | POA: Diagnosis not present

## 2017-05-27 DIAGNOSIS — M5136 Other intervertebral disc degeneration, lumbar region: Secondary | ICD-10-CM | POA: Diagnosis not present

## 2017-05-27 DIAGNOSIS — M25372 Other instability, left ankle: Secondary | ICD-10-CM | POA: Diagnosis not present

## 2017-05-27 DIAGNOSIS — R2689 Other abnormalities of gait and mobility: Secondary | ICD-10-CM | POA: Diagnosis not present

## 2017-05-31 DIAGNOSIS — R2689 Other abnormalities of gait and mobility: Secondary | ICD-10-CM | POA: Diagnosis not present

## 2017-05-31 DIAGNOSIS — M25372 Other instability, left ankle: Secondary | ICD-10-CM | POA: Diagnosis not present

## 2017-05-31 DIAGNOSIS — M5136 Other intervertebral disc degeneration, lumbar region: Secondary | ICD-10-CM | POA: Diagnosis not present

## 2017-05-31 DIAGNOSIS — M4716 Other spondylosis with myelopathy, lumbar region: Secondary | ICD-10-CM | POA: Diagnosis not present

## 2017-06-03 DIAGNOSIS — M25372 Other instability, left ankle: Secondary | ICD-10-CM | POA: Diagnosis not present

## 2017-06-03 DIAGNOSIS — M5136 Other intervertebral disc degeneration, lumbar region: Secondary | ICD-10-CM | POA: Diagnosis not present

## 2017-06-03 DIAGNOSIS — R2689 Other abnormalities of gait and mobility: Secondary | ICD-10-CM | POA: Diagnosis not present

## 2017-06-03 DIAGNOSIS — M4716 Other spondylosis with myelopathy, lumbar region: Secondary | ICD-10-CM | POA: Diagnosis not present

## 2017-06-07 DIAGNOSIS — M25372 Other instability, left ankle: Secondary | ICD-10-CM | POA: Diagnosis not present

## 2017-06-07 DIAGNOSIS — R2689 Other abnormalities of gait and mobility: Secondary | ICD-10-CM | POA: Diagnosis not present

## 2017-06-07 DIAGNOSIS — M4716 Other spondylosis with myelopathy, lumbar region: Secondary | ICD-10-CM | POA: Diagnosis not present

## 2017-06-07 DIAGNOSIS — M5136 Other intervertebral disc degeneration, lumbar region: Secondary | ICD-10-CM | POA: Diagnosis not present

## 2017-06-10 DIAGNOSIS — M4716 Other spondylosis with myelopathy, lumbar region: Secondary | ICD-10-CM | POA: Diagnosis not present

## 2017-06-10 DIAGNOSIS — R2689 Other abnormalities of gait and mobility: Secondary | ICD-10-CM | POA: Diagnosis not present

## 2017-06-10 DIAGNOSIS — M5136 Other intervertebral disc degeneration, lumbar region: Secondary | ICD-10-CM | POA: Diagnosis not present

## 2017-06-10 DIAGNOSIS — M25372 Other instability, left ankle: Secondary | ICD-10-CM | POA: Diagnosis not present

## 2017-06-13 DIAGNOSIS — M5136 Other intervertebral disc degeneration, lumbar region: Secondary | ICD-10-CM | POA: Diagnosis not present

## 2017-06-13 DIAGNOSIS — M25372 Other instability, left ankle: Secondary | ICD-10-CM | POA: Diagnosis not present

## 2017-06-13 DIAGNOSIS — M4716 Other spondylosis with myelopathy, lumbar region: Secondary | ICD-10-CM | POA: Diagnosis not present

## 2017-06-13 DIAGNOSIS — R2689 Other abnormalities of gait and mobility: Secondary | ICD-10-CM | POA: Diagnosis not present

## 2017-06-15 DIAGNOSIS — M5136 Other intervertebral disc degeneration, lumbar region: Secondary | ICD-10-CM | POA: Diagnosis not present

## 2017-06-15 DIAGNOSIS — R2689 Other abnormalities of gait and mobility: Secondary | ICD-10-CM | POA: Diagnosis not present

## 2017-06-15 DIAGNOSIS — M4716 Other spondylosis with myelopathy, lumbar region: Secondary | ICD-10-CM | POA: Diagnosis not present

## 2017-06-15 DIAGNOSIS — M25372 Other instability, left ankle: Secondary | ICD-10-CM | POA: Diagnosis not present

## 2017-06-21 DIAGNOSIS — M25372 Other instability, left ankle: Secondary | ICD-10-CM | POA: Diagnosis not present

## 2017-06-21 DIAGNOSIS — M5136 Other intervertebral disc degeneration, lumbar region: Secondary | ICD-10-CM | POA: Diagnosis not present

## 2017-06-21 DIAGNOSIS — R2689 Other abnormalities of gait and mobility: Secondary | ICD-10-CM | POA: Diagnosis not present

## 2017-06-21 DIAGNOSIS — M4716 Other spondylosis with myelopathy, lumbar region: Secondary | ICD-10-CM | POA: Diagnosis not present

## 2017-06-22 ENCOUNTER — Other Ambulatory Visit: Payer: Self-pay | Admitting: Internal Medicine

## 2017-06-24 DIAGNOSIS — M25372 Other instability, left ankle: Secondary | ICD-10-CM | POA: Diagnosis not present

## 2017-06-24 DIAGNOSIS — R2689 Other abnormalities of gait and mobility: Secondary | ICD-10-CM | POA: Diagnosis not present

## 2017-06-24 DIAGNOSIS — M5136 Other intervertebral disc degeneration, lumbar region: Secondary | ICD-10-CM | POA: Diagnosis not present

## 2017-06-24 DIAGNOSIS — M4716 Other spondylosis with myelopathy, lumbar region: Secondary | ICD-10-CM | POA: Diagnosis not present

## 2017-06-27 DIAGNOSIS — R2689 Other abnormalities of gait and mobility: Secondary | ICD-10-CM | POA: Diagnosis not present

## 2017-06-27 DIAGNOSIS — M4716 Other spondylosis with myelopathy, lumbar region: Secondary | ICD-10-CM | POA: Diagnosis not present

## 2017-06-27 DIAGNOSIS — M5136 Other intervertebral disc degeneration, lumbar region: Secondary | ICD-10-CM | POA: Diagnosis not present

## 2017-06-27 DIAGNOSIS — M25372 Other instability, left ankle: Secondary | ICD-10-CM | POA: Diagnosis not present

## 2017-06-30 DIAGNOSIS — R2689 Other abnormalities of gait and mobility: Secondary | ICD-10-CM | POA: Diagnosis not present

## 2017-06-30 DIAGNOSIS — M5136 Other intervertebral disc degeneration, lumbar region: Secondary | ICD-10-CM | POA: Diagnosis not present

## 2017-06-30 DIAGNOSIS — M4716 Other spondylosis with myelopathy, lumbar region: Secondary | ICD-10-CM | POA: Diagnosis not present

## 2017-06-30 DIAGNOSIS — M25372 Other instability, left ankle: Secondary | ICD-10-CM | POA: Diagnosis not present

## 2017-07-04 DIAGNOSIS — M545 Low back pain: Secondary | ICD-10-CM | POA: Diagnosis not present

## 2017-07-04 DIAGNOSIS — M21371 Foot drop, right foot: Secondary | ICD-10-CM | POA: Diagnosis not present

## 2017-07-04 DIAGNOSIS — I10 Essential (primary) hypertension: Secondary | ICD-10-CM | POA: Diagnosis not present

## 2017-07-04 DIAGNOSIS — Z6841 Body Mass Index (BMI) 40.0 and over, adult: Secondary | ICD-10-CM | POA: Diagnosis not present

## 2017-07-04 DIAGNOSIS — M48062 Spinal stenosis, lumbar region with neurogenic claudication: Secondary | ICD-10-CM | POA: Diagnosis not present

## 2017-07-04 DIAGNOSIS — M5416 Radiculopathy, lumbar region: Secondary | ICD-10-CM | POA: Diagnosis not present

## 2017-07-05 DIAGNOSIS — M25372 Other instability, left ankle: Secondary | ICD-10-CM | POA: Diagnosis not present

## 2017-07-05 DIAGNOSIS — R2689 Other abnormalities of gait and mobility: Secondary | ICD-10-CM | POA: Diagnosis not present

## 2017-07-05 DIAGNOSIS — M5136 Other intervertebral disc degeneration, lumbar region: Secondary | ICD-10-CM | POA: Diagnosis not present

## 2017-07-05 DIAGNOSIS — M4716 Other spondylosis with myelopathy, lumbar region: Secondary | ICD-10-CM | POA: Diagnosis not present

## 2017-07-08 DIAGNOSIS — M4716 Other spondylosis with myelopathy, lumbar region: Secondary | ICD-10-CM | POA: Diagnosis not present

## 2017-07-08 DIAGNOSIS — M5136 Other intervertebral disc degeneration, lumbar region: Secondary | ICD-10-CM | POA: Diagnosis not present

## 2017-07-08 DIAGNOSIS — R2689 Other abnormalities of gait and mobility: Secondary | ICD-10-CM | POA: Diagnosis not present

## 2017-07-08 DIAGNOSIS — M25372 Other instability, left ankle: Secondary | ICD-10-CM | POA: Diagnosis not present

## 2017-07-11 DIAGNOSIS — M25372 Other instability, left ankle: Secondary | ICD-10-CM | POA: Diagnosis not present

## 2017-07-11 DIAGNOSIS — M5136 Other intervertebral disc degeneration, lumbar region: Secondary | ICD-10-CM | POA: Diagnosis not present

## 2017-07-11 DIAGNOSIS — M4716 Other spondylosis with myelopathy, lumbar region: Secondary | ICD-10-CM | POA: Diagnosis not present

## 2017-07-11 DIAGNOSIS — R2689 Other abnormalities of gait and mobility: Secondary | ICD-10-CM | POA: Diagnosis not present

## 2017-07-14 DIAGNOSIS — R2689 Other abnormalities of gait and mobility: Secondary | ICD-10-CM | POA: Diagnosis not present

## 2017-07-14 DIAGNOSIS — M4716 Other spondylosis with myelopathy, lumbar region: Secondary | ICD-10-CM | POA: Diagnosis not present

## 2017-07-14 DIAGNOSIS — M5136 Other intervertebral disc degeneration, lumbar region: Secondary | ICD-10-CM | POA: Diagnosis not present

## 2017-07-14 DIAGNOSIS — M25372 Other instability, left ankle: Secondary | ICD-10-CM | POA: Diagnosis not present

## 2017-07-18 DIAGNOSIS — M5136 Other intervertebral disc degeneration, lumbar region: Secondary | ICD-10-CM | POA: Diagnosis not present

## 2017-07-18 DIAGNOSIS — R2689 Other abnormalities of gait and mobility: Secondary | ICD-10-CM | POA: Diagnosis not present

## 2017-07-18 DIAGNOSIS — M25372 Other instability, left ankle: Secondary | ICD-10-CM | POA: Diagnosis not present

## 2017-07-18 DIAGNOSIS — M4716 Other spondylosis with myelopathy, lumbar region: Secondary | ICD-10-CM | POA: Diagnosis not present

## 2017-07-21 DIAGNOSIS — M4716 Other spondylosis with myelopathy, lumbar region: Secondary | ICD-10-CM | POA: Diagnosis not present

## 2017-07-21 DIAGNOSIS — M5136 Other intervertebral disc degeneration, lumbar region: Secondary | ICD-10-CM | POA: Diagnosis not present

## 2017-07-21 DIAGNOSIS — M25372 Other instability, left ankle: Secondary | ICD-10-CM | POA: Diagnosis not present

## 2017-07-21 DIAGNOSIS — R2689 Other abnormalities of gait and mobility: Secondary | ICD-10-CM | POA: Diagnosis not present

## 2017-07-25 ENCOUNTER — Other Ambulatory Visit: Payer: Self-pay | Admitting: Neurology

## 2017-07-25 DIAGNOSIS — M4716 Other spondylosis with myelopathy, lumbar region: Secondary | ICD-10-CM | POA: Diagnosis not present

## 2017-07-25 DIAGNOSIS — M25372 Other instability, left ankle: Secondary | ICD-10-CM | POA: Diagnosis not present

## 2017-07-25 DIAGNOSIS — R2689 Other abnormalities of gait and mobility: Secondary | ICD-10-CM | POA: Diagnosis not present

## 2017-07-25 DIAGNOSIS — M5136 Other intervertebral disc degeneration, lumbar region: Secondary | ICD-10-CM | POA: Diagnosis not present

## 2017-07-25 MED ORDER — DONEPEZIL HCL 10 MG PO TABS: 10.0000 mg | ORAL_TABLET | Freq: Every day | ORAL | 3 refills | Status: DC

## 2017-07-25 NOTE — Telephone Encounter (Signed)
Called wife. Patient has been tolerating Aricept 5mg  tablet well, no SE. Agreeable to increase to 10mg  tablet as discussed at last visit. Advised I will send refill to [facility] drug as requested. She will call back with any further questions/concerns.

## 2017-07-28 DIAGNOSIS — M25372 Other instability, left ankle: Secondary | ICD-10-CM | POA: Diagnosis not present

## 2017-07-28 DIAGNOSIS — M4716 Other spondylosis with myelopathy, lumbar region: Secondary | ICD-10-CM | POA: Diagnosis not present

## 2017-07-28 DIAGNOSIS — R2689 Other abnormalities of gait and mobility: Secondary | ICD-10-CM | POA: Diagnosis not present

## 2017-07-28 DIAGNOSIS — M5136 Other intervertebral disc degeneration, lumbar region: Secondary | ICD-10-CM | POA: Diagnosis not present

## 2017-08-01 DIAGNOSIS — R2689 Other abnormalities of gait and mobility: Secondary | ICD-10-CM | POA: Diagnosis not present

## 2017-08-01 DIAGNOSIS — M25372 Other instability, left ankle: Secondary | ICD-10-CM | POA: Diagnosis not present

## 2017-08-01 DIAGNOSIS — M5136 Other intervertebral disc degeneration, lumbar region: Secondary | ICD-10-CM | POA: Diagnosis not present

## 2017-08-01 DIAGNOSIS — M4716 Other spondylosis with myelopathy, lumbar region: Secondary | ICD-10-CM | POA: Diagnosis not present

## 2017-08-04 DIAGNOSIS — R2689 Other abnormalities of gait and mobility: Secondary | ICD-10-CM | POA: Diagnosis not present

## 2017-08-04 DIAGNOSIS — M5136 Other intervertebral disc degeneration, lumbar region: Secondary | ICD-10-CM | POA: Diagnosis not present

## 2017-08-04 DIAGNOSIS — M4716 Other spondylosis with myelopathy, lumbar region: Secondary | ICD-10-CM | POA: Diagnosis not present

## 2017-08-04 DIAGNOSIS — M25372 Other instability, left ankle: Secondary | ICD-10-CM | POA: Diagnosis not present

## 2017-08-08 DIAGNOSIS — M25372 Other instability, left ankle: Secondary | ICD-10-CM | POA: Diagnosis not present

## 2017-08-08 DIAGNOSIS — R2689 Other abnormalities of gait and mobility: Secondary | ICD-10-CM | POA: Diagnosis not present

## 2017-08-08 DIAGNOSIS — M4716 Other spondylosis with myelopathy, lumbar region: Secondary | ICD-10-CM | POA: Diagnosis not present

## 2017-08-08 DIAGNOSIS — M5136 Other intervertebral disc degeneration, lumbar region: Secondary | ICD-10-CM | POA: Diagnosis not present

## 2017-08-11 DIAGNOSIS — R2689 Other abnormalities of gait and mobility: Secondary | ICD-10-CM | POA: Diagnosis not present

## 2017-08-11 DIAGNOSIS — M25372 Other instability, left ankle: Secondary | ICD-10-CM | POA: Diagnosis not present

## 2017-08-11 DIAGNOSIS — M4716 Other spondylosis with myelopathy, lumbar region: Secondary | ICD-10-CM | POA: Diagnosis not present

## 2017-08-11 DIAGNOSIS — M5136 Other intervertebral disc degeneration, lumbar region: Secondary | ICD-10-CM | POA: Diagnosis not present

## 2017-08-12 ENCOUNTER — Other Ambulatory Visit: Payer: Self-pay | Admitting: Internal Medicine

## 2017-08-12 DIAGNOSIS — E221 Hyperprolactinemia: Secondary | ICD-10-CM

## 2017-08-12 DIAGNOSIS — E7849 Other hyperlipidemia: Secondary | ICD-10-CM

## 2017-08-12 DIAGNOSIS — G4733 Obstructive sleep apnea (adult) (pediatric): Secondary | ICD-10-CM

## 2017-08-12 DIAGNOSIS — I519 Heart disease, unspecified: Secondary | ICD-10-CM

## 2017-08-12 DIAGNOSIS — N400 Enlarged prostate without lower urinary tract symptoms: Secondary | ICD-10-CM

## 2017-08-12 DIAGNOSIS — Z0000 Encounter for general adult medical examination without abnormal findings: Secondary | ICD-10-CM

## 2017-08-16 DIAGNOSIS — M4716 Other spondylosis with myelopathy, lumbar region: Secondary | ICD-10-CM | POA: Diagnosis not present

## 2017-08-16 DIAGNOSIS — R2689 Other abnormalities of gait and mobility: Secondary | ICD-10-CM | POA: Diagnosis not present

## 2017-08-16 DIAGNOSIS — M5136 Other intervertebral disc degeneration, lumbar region: Secondary | ICD-10-CM | POA: Diagnosis not present

## 2017-08-16 DIAGNOSIS — M25372 Other instability, left ankle: Secondary | ICD-10-CM | POA: Diagnosis not present

## 2017-08-18 DIAGNOSIS — M25372 Other instability, left ankle: Secondary | ICD-10-CM | POA: Diagnosis not present

## 2017-08-18 DIAGNOSIS — M5136 Other intervertebral disc degeneration, lumbar region: Secondary | ICD-10-CM | POA: Diagnosis not present

## 2017-08-18 DIAGNOSIS — R2689 Other abnormalities of gait and mobility: Secondary | ICD-10-CM | POA: Diagnosis not present

## 2017-08-18 DIAGNOSIS — M4716 Other spondylosis with myelopathy, lumbar region: Secondary | ICD-10-CM | POA: Diagnosis not present

## 2017-08-23 ENCOUNTER — Other Ambulatory Visit: Payer: Medicare Other | Admitting: Internal Medicine

## 2017-08-23 DIAGNOSIS — I519 Heart disease, unspecified: Secondary | ICD-10-CM | POA: Diagnosis not present

## 2017-08-23 DIAGNOSIS — E221 Hyperprolactinemia: Secondary | ICD-10-CM | POA: Diagnosis not present

## 2017-08-23 DIAGNOSIS — G4733 Obstructive sleep apnea (adult) (pediatric): Secondary | ICD-10-CM

## 2017-08-23 DIAGNOSIS — Z0000 Encounter for general adult medical examination without abnormal findings: Secondary | ICD-10-CM

## 2017-08-23 DIAGNOSIS — N400 Enlarged prostate without lower urinary tract symptoms: Secondary | ICD-10-CM | POA: Diagnosis not present

## 2017-08-23 DIAGNOSIS — E7849 Other hyperlipidemia: Secondary | ICD-10-CM

## 2017-08-23 LAB — COMPLETE METABOLIC PANEL WITH GFR
AG Ratio: 1.5 (calc) (ref 1.0–2.5)
ALT: 8 U/L — ABNORMAL LOW (ref 9–46)
AST: 13 U/L (ref 10–35)
Albumin: 3.8 g/dL (ref 3.6–5.1)
Alkaline phosphatase (APISO): 122 U/L — ABNORMAL HIGH (ref 40–115)
BUN: 13 mg/dL (ref 7–25)
CO2: 27 mmol/L (ref 20–32)
Calcium: 8.9 mg/dL (ref 8.6–10.3)
Chloride: 102 mmol/L (ref 98–110)
Creat: 0.73 mg/dL (ref 0.70–1.11)
GFR, Est African American: 102 mL/min/{1.73_m2} (ref >=60)
GFR, Est Non African American: 88 mL/min/{1.73_m2} (ref >=60)
Globulin: 2.5 g/dL (calc) (ref 1.9–3.7)
Glucose, Bld: 105 mg/dL — ABNORMAL HIGH (ref 65–99)
Potassium: 4.4 mmol/L (ref 3.5–5.3)
Sodium: 139 mmol/L (ref 135–146)
Total Bilirubin: 0.5 mg/dL (ref 0.2–1.2)
Total Protein: 6.3 g/dL (ref 6.1–8.1)

## 2017-08-23 LAB — CBC WITH DIFFERENTIAL/PLATELET
Basophils Absolute: 28 cells/uL (ref 0–200)
Basophils Relative: 0.3 %
Eosinophils Absolute: 156 cells/uL (ref 15–500)
Eosinophils Relative: 1.7 %
HCT: 44 % (ref 38.5–50.0)
Hemoglobin: 15.1 g/dL (ref 13.2–17.1)
Lymphs Abs: 1794 cells/uL (ref 850–3900)
MCH: 31.5 pg (ref 27.0–33.0)
MCHC: 34.3 g/dL (ref 32.0–36.0)
MCV: 91.7 fL (ref 80.0–100.0)
MPV: 10 fL (ref 7.5–12.5)
Monocytes Relative: 8.1 %
Neutro Abs: 6477 cells/uL (ref 1500–7800)
Neutrophils Relative %: 70.4 %
Platelets: 246 10*3/uL (ref 140–400)
RBC: 4.8 10*6/uL (ref 4.20–5.80)
RDW: 12.7 % (ref 11.0–15.0)
Total Lymphocyte: 19.5 %
WBC mixed population: 745 cells/uL (ref 200–950)
WBC: 9.2 10*3/uL (ref 3.8–10.8)

## 2017-08-23 LAB — LIPID PANEL
Cholesterol: 170 mg/dL (ref <200)
HDL: 37 mg/dL — ABNORMAL LOW (ref >40)
LDL Cholesterol (Calc): 112 mg/dL (calc) — ABNORMAL HIGH
Non-HDL Cholesterol (Calc): 133 mg/dL (calc) — ABNORMAL HIGH (ref <130)
Total CHOL/HDL Ratio: 4.6 (calc) (ref <5.0)
Triglycerides: 103 mg/dL (ref <150)

## 2017-08-23 LAB — PSA: PSA: 1.9 ng/mL (ref <=4.0)

## 2017-08-24 DIAGNOSIS — M25372 Other instability, left ankle: Secondary | ICD-10-CM | POA: Diagnosis not present

## 2017-08-24 DIAGNOSIS — R2689 Other abnormalities of gait and mobility: Secondary | ICD-10-CM | POA: Diagnosis not present

## 2017-08-24 DIAGNOSIS — M5136 Other intervertebral disc degeneration, lumbar region: Secondary | ICD-10-CM | POA: Diagnosis not present

## 2017-08-24 DIAGNOSIS — M4716 Other spondylosis with myelopathy, lumbar region: Secondary | ICD-10-CM | POA: Diagnosis not present

## 2017-08-25 ENCOUNTER — Ambulatory Visit (INDEPENDENT_AMBULATORY_CARE_PROVIDER_SITE_OTHER): Payer: Medicare Other | Admitting: Internal Medicine

## 2017-08-25 ENCOUNTER — Encounter: Payer: Self-pay | Admitting: Internal Medicine

## 2017-08-25 VITALS — BP 102/80 | HR 105 | Temp 98.2°F | Ht 74.0 in

## 2017-08-25 DIAGNOSIS — E7849 Other hyperlipidemia: Secondary | ICD-10-CM

## 2017-08-25 DIAGNOSIS — K219 Gastro-esophageal reflux disease without esophagitis: Secondary | ICD-10-CM | POA: Diagnosis not present

## 2017-08-25 DIAGNOSIS — Z0000 Encounter for general adult medical examination without abnormal findings: Secondary | ICD-10-CM

## 2017-08-25 DIAGNOSIS — R296 Repeated falls: Secondary | ICD-10-CM | POA: Diagnosis not present

## 2017-08-25 DIAGNOSIS — Z955 Presence of coronary angioplasty implant and graft: Secondary | ICD-10-CM | POA: Diagnosis not present

## 2017-08-25 DIAGNOSIS — D126 Benign neoplasm of colon, unspecified: Secondary | ICD-10-CM

## 2017-08-25 DIAGNOSIS — Z8709 Personal history of other diseases of the respiratory system: Secondary | ICD-10-CM

## 2017-08-25 DIAGNOSIS — I519 Heart disease, unspecified: Secondary | ICD-10-CM

## 2017-08-25 DIAGNOSIS — N400 Enlarged prostate without lower urinary tract symptoms: Secondary | ICD-10-CM

## 2017-08-25 DIAGNOSIS — M21372 Foot drop, left foot: Secondary | ICD-10-CM | POA: Diagnosis not present

## 2017-08-25 DIAGNOSIS — G4733 Obstructive sleep apnea (adult) (pediatric): Secondary | ICD-10-CM

## 2017-08-25 DIAGNOSIS — R413 Other amnesia: Secondary | ICD-10-CM | POA: Diagnosis not present

## 2017-08-25 DIAGNOSIS — E221 Hyperprolactinemia: Secondary | ICD-10-CM

## 2017-08-25 DIAGNOSIS — I10 Essential (primary) hypertension: Secondary | ICD-10-CM

## 2017-08-25 LAB — POCT URINALYSIS DIPSTICK
Appearance: NORMAL
Bilirubin, UA: NEGATIVE
Blood, UA: NEGATIVE
Glucose, UA: NEGATIVE
Ketones, UA: NEGATIVE
Leukocytes, UA: NEGATIVE
Nitrite, UA: NEGATIVE
Odor: NORMAL
Protein, UA: NEGATIVE
Spec Grav, UA: 1.02 (ref 1.010–1.025)
Urobilinogen, UA: 0.2 E.U./dL
pH, UA: 6 (ref 5.0–8.0)
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
## 2017-08-26 DIAGNOSIS — M4716 Other spondylosis with myelopathy, lumbar region: Secondary | ICD-10-CM | POA: Diagnosis not present

## 2017-08-26 DIAGNOSIS — M5136 Other intervertebral disc degeneration, lumbar region: Secondary | ICD-10-CM | POA: Diagnosis not present

## 2017-08-26 DIAGNOSIS — M25372 Other instability, left ankle: Secondary | ICD-10-CM | POA: Diagnosis not present

## 2017-08-26 DIAGNOSIS — R2689 Other abnormalities of gait and mobility: Secondary | ICD-10-CM | POA: Diagnosis not present

## 2017-08-29 DIAGNOSIS — M25372 Other instability, left ankle: Secondary | ICD-10-CM | POA: Diagnosis not present

## 2017-08-29 DIAGNOSIS — M5136 Other intervertebral disc degeneration, lumbar region: Secondary | ICD-10-CM | POA: Diagnosis not present

## 2017-08-29 DIAGNOSIS — M4716 Other spondylosis with myelopathy, lumbar region: Secondary | ICD-10-CM | POA: Diagnosis not present

## 2017-08-29 DIAGNOSIS — R2689 Other abnormalities of gait and mobility: Secondary | ICD-10-CM | POA: Diagnosis not present

## 2017-09-01 DIAGNOSIS — M4716 Other spondylosis with myelopathy, lumbar region: Secondary | ICD-10-CM | POA: Diagnosis not present

## 2017-09-01 DIAGNOSIS — M25372 Other instability, left ankle: Secondary | ICD-10-CM | POA: Diagnosis not present

## 2017-09-01 DIAGNOSIS — R2689 Other abnormalities of gait and mobility: Secondary | ICD-10-CM | POA: Diagnosis not present

## 2017-09-01 DIAGNOSIS — M5136 Other intervertebral disc degeneration, lumbar region: Secondary | ICD-10-CM | POA: Diagnosis not present

## 2017-09-05 DIAGNOSIS — M25372 Other instability, left ankle: Secondary | ICD-10-CM | POA: Diagnosis not present

## 2017-09-05 DIAGNOSIS — M4716 Other spondylosis with myelopathy, lumbar region: Secondary | ICD-10-CM | POA: Diagnosis not present

## 2017-09-05 DIAGNOSIS — R2689 Other abnormalities of gait and mobility: Secondary | ICD-10-CM | POA: Diagnosis not present

## 2017-09-05 DIAGNOSIS — M5136 Other intervertebral disc degeneration, lumbar region: Secondary | ICD-10-CM | POA: Diagnosis not present

## 2017-09-08 DIAGNOSIS — M25372 Other instability, left ankle: Secondary | ICD-10-CM | POA: Diagnosis not present

## 2017-09-08 DIAGNOSIS — R2689 Other abnormalities of gait and mobility: Secondary | ICD-10-CM | POA: Diagnosis not present

## 2017-09-08 DIAGNOSIS — M5136 Other intervertebral disc degeneration, lumbar region: Secondary | ICD-10-CM | POA: Diagnosis not present

## 2017-09-08 DIAGNOSIS — M4716 Other spondylosis with myelopathy, lumbar region: Secondary | ICD-10-CM | POA: Diagnosis not present

## 2017-09-13 DIAGNOSIS — Z08 Encounter for follow-up examination after completed treatment for malignant neoplasm: Secondary | ICD-10-CM | POA: Diagnosis not present

## 2017-09-13 DIAGNOSIS — Z1283 Encounter for screening for malignant neoplasm of skin: Secondary | ICD-10-CM | POA: Diagnosis not present

## 2017-09-13 DIAGNOSIS — M25372 Other instability, left ankle: Secondary | ICD-10-CM | POA: Diagnosis not present

## 2017-09-13 DIAGNOSIS — R2689 Other abnormalities of gait and mobility: Secondary | ICD-10-CM | POA: Diagnosis not present

## 2017-09-13 DIAGNOSIS — Z85820 Personal history of malignant melanoma of skin: Secondary | ICD-10-CM | POA: Diagnosis not present

## 2017-09-13 DIAGNOSIS — X32XXXD Exposure to sunlight, subsequent encounter: Secondary | ICD-10-CM | POA: Diagnosis not present

## 2017-09-13 DIAGNOSIS — D225 Melanocytic nevi of trunk: Secondary | ICD-10-CM | POA: Diagnosis not present

## 2017-09-13 DIAGNOSIS — M5136 Other intervertebral disc degeneration, lumbar region: Secondary | ICD-10-CM | POA: Diagnosis not present

## 2017-09-13 DIAGNOSIS — L570 Actinic keratosis: Secondary | ICD-10-CM | POA: Diagnosis not present

## 2017-09-13 DIAGNOSIS — M4716 Other spondylosis with myelopathy, lumbar region: Secondary | ICD-10-CM | POA: Diagnosis not present

## 2017-09-16 ENCOUNTER — Encounter: Payer: Self-pay | Admitting: Internal Medicine

## 2017-09-16 DIAGNOSIS — M5136 Other intervertebral disc degeneration, lumbar region: Secondary | ICD-10-CM | POA: Diagnosis not present

## 2017-09-16 DIAGNOSIS — R2689 Other abnormalities of gait and mobility: Secondary | ICD-10-CM | POA: Diagnosis not present

## 2017-09-16 DIAGNOSIS — M4716 Other spondylosis with myelopathy, lumbar region: Secondary | ICD-10-CM | POA: Diagnosis not present

## 2017-09-16 DIAGNOSIS — M25372 Other instability, left ankle: Secondary | ICD-10-CM | POA: Diagnosis not present

## 2017-09-16 NOTE — Patient Instructions (Signed)
It was a pleasure to see you today.  See orthopedist regarding left foot drop.  Consider 24-hour Holter monitor irregular contractions due to on heart exam today.  Otherwise return in 6 months.

## 2017-09-26 ENCOUNTER — Other Ambulatory Visit: Payer: Self-pay | Admitting: Neurology

## 2017-09-26 ENCOUNTER — Telehealth: Payer: Self-pay | Admitting: Neurology

## 2017-09-26 NOTE — Telephone Encounter (Signed)
I called the patient.  The patient is to switch to the 10 mg dosing for the morning, if he is still having bad dreams at night they are to cut the tablet in half and start taking 5 mg daily.

## 2017-09-26 NOTE — Telephone Encounter (Signed)
Pt's wife called since increasing donepezil (ARICEPT) 10 MG tablet 1 tab at bedtime he is having bad dreams, talking in his sleep. Said she did not notice any side effects on the 5mg . He is due for 10mg  to be refilled tomorrow.  Please call to advise

## 2017-10-11 DIAGNOSIS — Z85828 Personal history of other malignant neoplasm of skin: Secondary | ICD-10-CM | POA: Diagnosis not present

## 2017-10-11 DIAGNOSIS — Z08 Encounter for follow-up examination after completed treatment for malignant neoplasm: Secondary | ICD-10-CM | POA: Diagnosis not present

## 2017-10-11 DIAGNOSIS — L570 Actinic keratosis: Secondary | ICD-10-CM | POA: Diagnosis not present

## 2017-10-11 DIAGNOSIS — X32XXXD Exposure to sunlight, subsequent encounter: Secondary | ICD-10-CM | POA: Diagnosis not present

## 2017-10-17 DIAGNOSIS — M25572 Pain in left ankle and joints of left foot: Secondary | ICD-10-CM | POA: Diagnosis not present

## 2017-10-17 DIAGNOSIS — M19271 Secondary osteoarthritis, right ankle and foot: Secondary | ICD-10-CM | POA: Diagnosis not present

## 2017-10-17 DIAGNOSIS — M21371 Foot drop, right foot: Secondary | ICD-10-CM | POA: Diagnosis not present

## 2017-10-17 DIAGNOSIS — M25571 Pain in right ankle and joints of right foot: Secondary | ICD-10-CM | POA: Diagnosis not present

## 2017-11-16 ENCOUNTER — Encounter: Payer: Self-pay | Admitting: Neurology

## 2017-11-16 ENCOUNTER — Ambulatory Visit (INDEPENDENT_AMBULATORY_CARE_PROVIDER_SITE_OTHER): Payer: Medicare Other | Admitting: Neurology

## 2017-11-16 VITALS — BP 131/80 | HR 105 | Ht 74.0 in | Wt 317.0 lb

## 2017-11-16 DIAGNOSIS — G8929 Other chronic pain: Secondary | ICD-10-CM

## 2017-11-16 DIAGNOSIS — M545 Low back pain, unspecified: Secondary | ICD-10-CM

## 2017-11-16 DIAGNOSIS — R413 Other amnesia: Secondary | ICD-10-CM

## 2017-11-16 MED ORDER — DONEPEZIL HCL 10 MG PO TABS
10.0000 mg | ORAL_TABLET | Freq: Every day | ORAL | 3 refills | Status: DC
Start: 2017-11-16 — End: 2017-12-27

## 2017-11-16 NOTE — Progress Notes (Signed)
Reason for visit: Memory disturbance  Mitchell [name] is an 83 y.o. male  History of present illness:  Mitchell [name] is an 83 year old right-handed white male with a history of lumbosacral spinal stenosis, bilateral total hip and knee replacements, and a gait disorder.  The patient has bilateral foot drops and weakness in both legs that he believes is related to the low back problem.  The patient has had surgery in July 2017 by Dr. Vertell Limber.  The patient is undergoing physical therapy, he does exercises with the legs regularly.  He does have significant back pain that comes on as soon as he stands up, he is only able to stand for about 10 minutes because of the pain.  The patient has had a memory disturbance, he was sent to this office for this reason.  He is on Aricept taking 10 mg in the morning.  Taking the medication in the evening resulted in vivid dreams at night.  He tolerates the drug fairly well.  He returns for an evaluation.  He does not believe there has been a significant cognitive change since last seen.  He sleeps about 10 hours at night.  Past Medical History:  Diagnosis Date  . Acute on chronic systolic heart failure, usually asymptomatic but with tachycardia HF became acute now resolved 05/26/2011  . Adenomatous polyp   . Ankle fracture    right  . Anxiety   . Arthritis   . Asthma    Symbicort daily as needed  . Atrial flutter with rapid ventricular response ([place]) 05/25/2011  . CHF, acute, mild secondary to EF 40-45% an a. flutter with RVR 05/25/2011   takes Furosemide daily as needed  . Constipation    takes Miralax daily as needed  . Coronary artery disease    2D ECHO, 08/24/2011 - EF >55%; NUCLEAR STRESS TEST, 04/21/2010 - mild ischemia in Basal Inferolateral and Mid Inferolateral regions, post-stress EF 56%,   . Depression    takes Cymbalta daily  . Dysrhythmia    atrial fibrilation  . GERD (gastroesophageal reflux disease)    takes Pantoprazole daily  . History of blood  transfusion 53  yrs ago   got hives  . Hyperlipidemia    takes Zetia daily  . Hypertension    takes Amlodipine and Atenolol daily  . LV dysfunction, EF by ECHO 40-45% 04/29/11 05/25/2011  . Melanoma ([place])    l neck  . Pneumonia   . Prostatitis   . Shortness of breath   . Sleep apnea    no longer has since loss of 70 pounds  . SOB (shortness of breath), secondary to SOB 05/25/2011  . Spinal stenosis     Past Surgical History:  Procedure Laterality Date  . APPENDECTOMY    . BACK SURGERY    . CARDIAC CATHETERIZATION  03/18/2000   PDA between the first PLA branch stented with a 3x8 Guidant Penta stent resulting in reduction of a 99% stenosis to 0% residual  . CARDIOVERSION  05/26/2011   Procedure: CARDIOVERSION;  Surgeon: Sanda Klein, MD;  Location: [facility];  Service: Cardiovascular;  Laterality: N/A;  . CORONARY ANGIOPLASTY    . HERNIA REPAIR     umbilical  . JOINT REPLACEMENT     left knee  . JOINT REPLACEMENT     left hip  . knee replacements Bilateral   . LUMBAR LAMINECTOMY/DECOMPRESSION MICRODISCECTOMY    . ROTATOR CUFF REPAIR Left   . TEE WITHOUT CARDIOVERSION  05/26/2011  Procedure: TRANSESOPHAGEAL ECHOCARDIOGRAM (TEE);  Surgeon: Sanda Klein, MD;  Location[facility];  Service: Cardiovascular;  Laterality: N/A;    Family History  Problem Relation Age of Onset  . Heart failure Mother 14  . Heart failure Father 29  . Cancer Paternal Grandfather     Social history:  reports that he has never smoked. He quit smokeless tobacco use about 45 years ago. His smokeless tobacco use included chew. He reports that he does not drink alcohol or use drugs.    Allergies  Allergen Reactions  . Statins Other (See Comments)    Leg pain  . Adhesive [Tape] Itching and Rash    Please use "paper" tape    Medications:  Prior to Admission medications   Medication Sig Start Date End Date Taking? Authorizing Provider  acetaminophen (TYLENOL) 650 MG CR tablet Take 650 mg by  mouth 2 (two) times daily.   Yes [provider]  amLODipine (NORVASC) 5 MG tablet Take 1 tablet (5 mg total) daily by mouth. 02/24/17  Yes Elby Showers, MD  Aspirin (ASPIR-81 PO) Take 81 mg by mouth daily.   Yes [provider]  bisoprolol (ZEBETA) 5 MG tablet Take 1 tablet (5 mg total) daily by mouth. 02/24/17  Yes Baxley, Cresenciano Lick, MD  budesonide-formoterol (SYMBICORT) 80-4.5 MCG/ACT inhaler Take 2 puffs first thing in am and then another 2 puffs about 12 hours later. Patient taking differently: Inhale 2 puffs into the lungs as needed. Take 2 puffs first thing in am and then another 2 puffs about 12 hours later. 02/27/16  Yes Tanda Rockers, MD  cyanocobalamin 2000 MCG tablet Take 2,500 mcg by mouth daily.   Yes [provider]  docusate sodium (COLACE) 100 MG capsule Take 1 capsule (100 mg total) by mouth 2 (two) times daily. 11/21/15  Yes Love, Ivan Anchors, PA-C  donepezil (ARICEPT) 10 MG tablet Take 1 tablet (10 mg total) by mouth daily. 11/16/17  Yes Kathrynn Ducking, MD  ezetimibe (ZETIA) 10 MG tablet TAKE 1 TABLET BY MOUTH ONCE DAILY. 06/22/17  Yes Baxley, Cresenciano Lick, MD  furosemide (LASIX) 40 MG tablet One half tablet every other day 02/24/17  Yes Baxley, Cresenciano Lick, MD  hydrocortisone cream 1 % Apply 1 application topically daily as needed for itching.    Yes [provider]  meloxicam (MOBIC) 15 MG tablet Take 1 tablet (15 mg total) by mouth daily. 11/10/16  Yes Baxley, Cresenciano Lick, MD  OVER THE COUNTER MEDICATION Take 1 tablet by mouth daily. Vitamin B12 & Folate   Yes [provider]  pantoprazole (PROTONIX) 40 MG tablet Take 1 tablet (40 mg total) daily by mouth. 02/24/17  Yes Baxley, Cresenciano Lick, MD  polyethylene glycol (MIRALAX / GLYCOLAX) packet Take 17 g by mouth at bedtime.    Yes [provider]    ROS:  Out of a complete 14 system review of symptoms, the patient complains only of the following symptoms, and all other reviewed systems are  negative.  Hearing loss Eye itching Leg swelling Sleep talking Frequency of urination, testicular pain Joint pain, back pain, walking difficulty Bruising easily Memory loss Confusion, anxiety  Blood pressure 131/80, pulse (!) 105, height 6\' 2"  (1.88 m), weight (!) 317 lb (143.8 kg), SpO2 94 %.  Physical Exam  General: The patient is alert and cooperative at the time of the examination.  The patient is markedly obese.  Skin:  2-3+ edema below the knee is seen bilaterally.  Neurologic Exam  Mental status: The patient is alert and oriented x 3 at the time of the examination. The Mini-Mental status examination done today shows a total score of 21/30.   Cranial nerves: Facial symmetry is present. Speech is normal, no aphasia or dysarthria is noted. Extraocular movements are full. Visual fields are full.  Motor: The patient has good strength in the upper extremities.  With the lower extremities, the patient has 3/5 strength in hip flexion bilaterally, 4/5 strength with knee extension and flexion.  He has bilateral foot drops.  Sensory examination: Soft touch sensation is symmetric on the face, arms, and legs.  Coordination: The patient has good finger-nose-finger and heel-to-shin bilaterally.  Gait and station: The patient has a wide-based unsteady gait.  The patient can walk with assistance.  Tandem gait was not attempted.  Reflexes: Deep tendon reflexes are symmetric, but are depressed.   MRI lumbar 08/20/15:  IMPRESSION: Severe central canal stenosis at L2-3 due to a disc bulge and ligamentum flavum thickening. Large cephalad extending disc extrusion in the right paracentral position could impact the descending right L2 root.  Moderate central canal stenosis, severe right and moderately severe left foraminal narrowing at L4-5 have worsened since the prior examination. The patient is status post posterior decompression at this level.  Severe central canal and  bilateral foraminal stenosis at L5-S1 have worsened since the prior examination. The patient is status post posterior decompression this level.  * MRI scan images were reviewed online. I agree with the written report.    Assessment/Plan:  1.  Chronic low back pain, spinal stenosis  2.  Bilateral leg weakness, gait disorder  3.  Mild memory disturbance  The patient will continue the Aricept for now.  We will follow him for the memory issues.  The patient will be set up for an epidural steroid injection for the low back pain.  The patient will follow-up in about 6 months.  The patient claims the leg weakness is related to his low back issues.  Jill Alexanders MD 11/16/2017 2:31 PM  [facility] [address] [place], [place] [address]  Phone [phone] Fax [phone]

## 2017-11-18 ENCOUNTER — Other Ambulatory Visit: Payer: Self-pay | Admitting: Neurology

## 2017-11-18 DIAGNOSIS — G8929 Other chronic pain: Secondary | ICD-10-CM

## 2017-11-18 DIAGNOSIS — M545 Low back pain: Principal | ICD-10-CM

## 2017-11-30 ENCOUNTER — Other Ambulatory Visit: Payer: Self-pay | Admitting: Neurology

## 2017-11-30 ENCOUNTER — Ambulatory Visit
Admission: RE | Admit: 2017-11-30 | Discharge: 2017-11-30 | Disposition: A | Discharge status: Home / Self Care | Payer: Medicare Other | Source: Ambulatory Visit | Attending: Neurology | Admitting: Neurology

## 2017-11-30 DIAGNOSIS — G8929 Other chronic pain: Secondary | ICD-10-CM

## 2017-11-30 DIAGNOSIS — M48061 Spinal stenosis, lumbar region without neurogenic claudication: Secondary | ICD-10-CM | POA: Diagnosis not present

## 2017-11-30 DIAGNOSIS — M545 Low back pain: Principal | ICD-10-CM

## 2017-11-30 MED ORDER — IOPAMIDOL (ISOVUE-M 200) INJECTION 41%
1.0000 mL | Freq: Once | INTRAMUSCULAR | Status: AC
  Administered 2017-11-30: 1 mL via EPIDURAL

## 2017-11-30 MED ORDER — METHYLPREDNISOLONE ACETATE 40 MG/ML INJ SUSP (RADIOLOG
120.0000 mg | Freq: Once | INTRAMUSCULAR | Status: AC
  Administered 2017-11-30: 120 mg via EPIDURAL

## 2017-11-30 NOTE — Discharge Instructions (Signed)

## 2017-12-02 ENCOUNTER — Other Ambulatory Visit: Payer: Self-pay | Admitting: Internal Medicine

## 2017-12-27 ENCOUNTER — Encounter: Payer: Self-pay | Admitting: Internal Medicine

## 2017-12-27 ENCOUNTER — Ambulatory Visit (INDEPENDENT_AMBULATORY_CARE_PROVIDER_SITE_OTHER): Payer: Medicare Other | Admitting: Internal Medicine

## 2017-12-27 VITALS — BP 110/80 | HR 103 | Temp 98.1°F | Ht 74.0 in | Wt 306.0 lb

## 2017-12-27 DIAGNOSIS — R300 Dysuria: Secondary | ICD-10-CM | POA: Diagnosis not present

## 2017-12-27 DIAGNOSIS — M21372 Foot drop, left foot: Secondary | ICD-10-CM | POA: Diagnosis not present

## 2017-12-27 DIAGNOSIS — N390 Urinary tract infection, site not specified: Secondary | ICD-10-CM

## 2017-12-27 DIAGNOSIS — R8290 Unspecified abnormal findings in urine: Secondary | ICD-10-CM

## 2017-12-27 LAB — POCT URINALYSIS DIPSTICK
Bilirubin, UA: NEGATIVE
Glucose, UA: NEGATIVE
Ketones, UA: NEGATIVE
Nitrite, UA: NEGATIVE
Protein, UA: POSITIVE — AB
Spec Grav, UA: 1.015
Urobilinogen, UA: 0.2 U/dL
pH, UA: 6

## 2017-12-27 MED ORDER — LEVOFLOXACIN 250 MG PO TABS: 250.0000 mg | ORAL_TABLET | Freq: Every day | ORAL | 0 refills | Status: DC

## 2017-12-27 MED ORDER — CEFTRIAXONE SODIUM 1 G IJ SOLR
1.0000 g | Freq: Once | INTRAMUSCULAR | Status: AC
Start: 2017-12-27 — End: 2017-12-27
  Administered 2017-12-27: 1 g via INTRAMUSCULAR

## 2017-12-27 NOTE — Progress Notes (Signed)
   Subjective:    Patient ID: Mitchell [name], male    DOB: 1934/07/03, 83 y.o.   MRN: [id number]  HPI 83 year old Male in today with several day history of dysuria.  No hematuria.  Has malaise and fatigue.  No fever or shaking chills.  History of urinary tract infections but none in the recent past.  Urine specimen shows moderate LE on dipstick.  With regard to left foot drop-ankle brace provided at [name] is very heavy.  He would like to have something lighter.    Review of Systems:  No nausea or vomiting.     Objective:   Physical Exam  No CVA tenderness.  Urine dipstick abnormal.  Culture sent.      Assessment & Plan:  Acute urinary tract infection  History of urinary tract infections  Chronic left foot drop-patient is going to check into getting lighter brace for his foot  Plan: Rocephin 1 g IM.  Levaquin 250 mg daily for 10 days.  Urine culture pending.  Bring clean-catch urine specimen and in a couple of weeks for follow-up.  Addendum: Urine culture revealed multiple organisms present each less than 10,000 colony-forming units per milliliter.  20 minutes spent with patient

## 2017-12-29 LAB — URINE CULTURE
MICRO NUMBER:: 91081661
SPECIMEN QUALITY:: ADEQUATE

## 2018-01-02 DIAGNOSIS — M5126 Other intervertebral disc displacement, lumbar region: Secondary | ICD-10-CM | POA: Diagnosis not present

## 2018-01-02 DIAGNOSIS — M545 Low back pain: Secondary | ICD-10-CM | POA: Diagnosis not present

## 2018-01-02 DIAGNOSIS — Z6839 Body mass index (BMI) 39.0-39.9, adult: Secondary | ICD-10-CM | POA: Diagnosis not present

## 2018-01-02 DIAGNOSIS — M5416 Radiculopathy, lumbar region: Secondary | ICD-10-CM | POA: Diagnosis not present

## 2018-01-02 DIAGNOSIS — M21371 Foot drop, right foot: Secondary | ICD-10-CM | POA: Diagnosis not present

## 2018-01-05 ENCOUNTER — Other Ambulatory Visit (INDEPENDENT_AMBULATORY_CARE_PROVIDER_SITE_OTHER): Payer: Medicare Other | Admitting: Internal Medicine

## 2018-01-05 DIAGNOSIS — R8290 Unspecified abnormal findings in urine: Secondary | ICD-10-CM | POA: Diagnosis not present

## 2018-01-05 DIAGNOSIS — E785 Hyperlipidemia, unspecified: Secondary | ICD-10-CM

## 2018-01-05 DIAGNOSIS — E119 Type 2 diabetes mellitus without complications: Secondary | ICD-10-CM

## 2018-01-05 LAB — POCT URINALYSIS DIPSTICK
Appearance: NORMAL
Bilirubin, UA: NEGATIVE
Blood, UA: NEGATIVE
Glucose, UA: NEGATIVE
Ketones, UA: NEGATIVE
Leukocytes, UA: NEGATIVE
Nitrite, UA: NEGATIVE
Odor: NORMAL
Protein, UA: NEGATIVE
Spec Grav, UA: 1.01 (ref 1.010–1.025)
Urobilinogen, UA: 0.2 E.U./dL
pH, UA: 6.5 (ref 5.0–8.0)

## 2018-01-15 NOTE — Patient Instructions (Signed)
Rocephin 1 g IM.  Levaquin 250 mg daily for 10 days.  Bring follow-up clean-catch urine specimen in 2 weeks.

## 2018-01-30 ENCOUNTER — Ambulatory Visit (INDEPENDENT_AMBULATORY_CARE_PROVIDER_SITE_OTHER): Payer: Medicare Other | Admitting: Internal Medicine

## 2018-01-30 DIAGNOSIS — Z23 Encounter for immunization: Secondary | ICD-10-CM | POA: Diagnosis not present

## 2018-01-30 NOTE — Patient Instructions (Signed)
Patient received a flu vaccine IM L deltoid, AV, CMA  

## 2018-01-30 NOTE — Progress Notes (Signed)
Flu vaccine given by CMA. Pt requested staff come to car and give flu vaccine since he was tired after PT. Lucretia Roers accompanied Araceli Valencia,CMA  to the car

## 2018-01-31 ENCOUNTER — Telehealth: Payer: Self-pay | Admitting: Internal Medicine

## 2018-01-31 NOTE — Telephone Encounter (Signed)
Wife states that he was feeling a little sluggish today, but she thought it was just due to him having the flu shot yesterday.  She continued to give him the Tylenol.  When he urinated later on this afternoon, he complained of burning with urination.  She took his temp, it was only 97.6.  He doesn't really have any other symptoms of UTI, but she says that he did urinate in the bed a couple of nights ago.  She's not sure if he spilled the urinal, or if he actually urinated in the bed.  She didn't think anything of it, until today when the burning happened.  Now she is wondering if he is beginning to get a UTI again.  She isn't sure she can get another sample this evening to get to [place].  Do you want him to wait until Thursday for an appointment?  Or, do you want to see him tomorrow at 11:30?    Thank you.    Phone:  [phone]

## 2018-01-31 NOTE — Telephone Encounter (Signed)
Per Dr. Renold Genta; patient did not have a true UTI; his culture did not grow anything.  He had multiple contaminants which means that he most likely did not get a true clean catch for [place].  She wants to see how patient does overnight and have wife call us back tomorrow morning after 9:00 a.m.provide an update at that time.  We will go from there as to what we need to do from there.    Called wife back at [phone] and provided this information to Lelon Frohlich, patients wife.  She verbalized understanding of these instructions.  She will call us back in the morning.

## 2018-01-31 NOTE — Telephone Encounter (Signed)
His culture did not have significant growth of one organism. Let's see how he is in the morning.

## 2018-02-01 DIAGNOSIS — R509 Fever, unspecified: Secondary | ICD-10-CM

## 2018-02-01 DIAGNOSIS — R300 Dysuria: Secondary | ICD-10-CM

## 2018-02-01 MED ORDER — CIPROFLOXACIN HCL 500 MG PO TABS: 500.0000 mg | ORAL_TABLET | Freq: Two times a day (BID) | ORAL | 0 refills | Status: DC

## 2018-02-01 NOTE — Telephone Encounter (Signed)
Patient is unable to come for an office visit due to not having transportation. Will call in cipro 500mg  BID for ten days per Dr. Renold Genta.[name]

## 2018-02-23 ENCOUNTER — Other Ambulatory Visit: Payer: Medicare Other | Admitting: Internal Medicine

## 2018-02-23 DIAGNOSIS — E119 Type 2 diabetes mellitus without complications: Secondary | ICD-10-CM | POA: Diagnosis not present

## 2018-02-23 DIAGNOSIS — E785 Hyperlipidemia, unspecified: Secondary | ICD-10-CM

## 2018-02-24 LAB — LIPID PANEL
Cholesterol: 183 mg/dL (ref <200)
HDL: 46 mg/dL (ref >40)
LDL Cholesterol (Calc): 118 mg/dL (calc) — ABNORMAL HIGH
Non-HDL Cholesterol (Calc): 137 mg/dL (calc) — ABNORMAL HIGH (ref <130)
Total CHOL/HDL Ratio: 4 (calc) (ref <5.0)
Triglycerides: 93 mg/dL (ref <150)

## 2018-02-24 LAB — HEPATIC FUNCTION PANEL
AG Ratio: 1.6 (calc) (ref 1.0–2.5)
ALT: 10 U/L (ref 9–46)
AST: 14 U/L (ref 10–35)
Albumin: 3.9 g/dL (ref 3.6–5.1)
Alkaline phosphatase (APISO): 107 U/L (ref 40–115)
Bilirubin, Direct: 0.1 mg/dL (ref 0.0–0.2)
Globulin: 2.5 g/dL (calc) (ref 1.9–3.7)
Indirect Bilirubin: 0.4 mg/dL (calc) (ref 0.2–1.2)
Total Bilirubin: 0.5 mg/dL (ref 0.2–1.2)
Total Protein: 6.4 g/dL (ref 6.1–8.1)

## 2018-02-24 LAB — MICROALBUMIN / CREATININE URINE RATIO
Creatinine, Urine: 98 mg/dL (ref 20–320)
Microalb Creat Ratio: 19 mcg/mg creat (ref <30)
Microalb, Ur: 1.9 mg/dL

## 2018-02-24 LAB — HEMOGLOBIN A1C
Hgb A1c MFr Bld: 5.7 % of total Hgb — ABNORMAL HIGH (ref <5.7)
Mean Plasma Glucose: 117 (calc)
eAG (mmol/L): 6.5 (calc)

## 2018-02-28 ENCOUNTER — Ambulatory Visit: Payer: Medicare Other | Admitting: Internal Medicine

## 2018-03-02 ENCOUNTER — Other Ambulatory Visit: Payer: Self-pay | Admitting: Internal Medicine

## 2018-03-02 DIAGNOSIS — I10 Essential (primary) hypertension: Secondary | ICD-10-CM

## 2018-03-06 ENCOUNTER — Encounter: Payer: Self-pay | Admitting: Internal Medicine

## 2018-03-06 ENCOUNTER — Ambulatory Visit (INDEPENDENT_AMBULATORY_CARE_PROVIDER_SITE_OTHER): Payer: Medicare Other | Admitting: Internal Medicine

## 2018-03-06 VITALS — BP 120/80 | HR 104 | Temp 98.3°F | Wt 310.0 lb

## 2018-03-06 DIAGNOSIS — Z8709 Personal history of other diseases of the respiratory system: Secondary | ICD-10-CM | POA: Diagnosis not present

## 2018-03-06 DIAGNOSIS — R413 Other amnesia: Secondary | ICD-10-CM | POA: Diagnosis not present

## 2018-03-06 DIAGNOSIS — R609 Edema, unspecified: Secondary | ICD-10-CM | POA: Diagnosis not present

## 2018-03-06 DIAGNOSIS — K219 Gastro-esophageal reflux disease without esophagitis: Secondary | ICD-10-CM | POA: Diagnosis not present

## 2018-03-06 DIAGNOSIS — E785 Hyperlipidemia, unspecified: Secondary | ICD-10-CM

## 2018-03-06 DIAGNOSIS — Z87440 Personal history of urinary (tract) infections: Secondary | ICD-10-CM | POA: Diagnosis not present

## 2018-03-06 DIAGNOSIS — R296 Repeated falls: Secondary | ICD-10-CM | POA: Diagnosis not present

## 2018-03-06 DIAGNOSIS — E119 Type 2 diabetes mellitus without complications: Secondary | ICD-10-CM

## 2018-03-06 DIAGNOSIS — I10 Essential (primary) hypertension: Secondary | ICD-10-CM | POA: Diagnosis not present

## 2018-03-06 DIAGNOSIS — M21371 Foot drop, right foot: Secondary | ICD-10-CM | POA: Diagnosis not present

## 2018-03-06 DIAGNOSIS — G4733 Obstructive sleep apnea (adult) (pediatric): Secondary | ICD-10-CM | POA: Diagnosis not present

## 2018-03-06 NOTE — Progress Notes (Signed)
   Subjective:    Patient ID: Mitchell [name], male    DOB: Nov 25, 1934, 83 y.o.   MRN: [id number]  HPI 83 year old Male for 6 month recheck. Has right foot drop that requires brace. Left foot tends to turn outside.  Considerable difficulty ambulating and is high risk fall.  Hgb AIC 5.7%.  Liver functions are normal.  History of memory loss. See neurologist.  Lipid panel is normal with the exception of an LDL of 118.  Blood pressure stable at 120/84.  He weighs 310 pounds.  Wife is anxious about taking care of him but refuses to have help from outside the home.  He can subtract quite well but has trouble remembering things in the recent past.  Remote memory is pretty good.  Memory tested today he did not get the president correct nor the day of the week.  He remains on meloxicam for musculoskeletal pain.  Has history of asthma and sometimes uses Xopenex and Symbicort inhalers.  Takes MiraLAX for constipation.  Is on Protonix for GE reflux.  Is on amlodipine and Zebeta for hypertension.  Has dependent edema         Review of Systems no new complaints     Objective:   Physical Exam Neck is supple without JVD thyromegaly or carotid bruits.  Chest clear to auscultation without rales or wheezing.  Cardiac exam regular rate and rhythm normal S1 and S2.  Vital signs reviewed.  Skin is warm and dry.  Trace lower extremity edema bilaterally.  He is alert but not oriented to president and day of week       Assessment & Plan:  Memory loss-followed by neurologist  Dependent edema-treated with diuretic  Diabetes mellitus without complication-diet-controlled  Right foot drop treated with brace  Frequent falls  History of asthma-has inhalers  History of recurrent urinary infections with no growth on culture recently but symptoms were compatible with infection  GE reflux treated with PPI  History of sleep apnea  Morbid obesity  Hyperlipidemia treated with  Zetia  Plan: He continues to exercise with [facility] on a regular basis which is good.  He is a high risk fall.  Continue same medications and return in 6 months.

## 2018-03-07 ENCOUNTER — Other Ambulatory Visit: Payer: Self-pay

## 2018-03-13 DIAGNOSIS — I10 Essential (primary) hypertension: Secondary | ICD-10-CM | POA: Diagnosis not present

## 2018-03-13 DIAGNOSIS — M21371 Foot drop, right foot: Secondary | ICD-10-CM | POA: Diagnosis not present

## 2018-03-13 DIAGNOSIS — M5416 Radiculopathy, lumbar region: Secondary | ICD-10-CM | POA: Diagnosis not present

## 2018-03-13 DIAGNOSIS — Z6838 Body mass index (BMI) 38.0-38.9, adult: Secondary | ICD-10-CM | POA: Diagnosis not present

## 2018-03-13 DIAGNOSIS — M48062 Spinal stenosis, lumbar region with neurogenic claudication: Secondary | ICD-10-CM | POA: Diagnosis not present

## 2018-03-14 ENCOUNTER — Other Ambulatory Visit: Payer: Self-pay | Admitting: Internal Medicine

## 2018-03-14 DIAGNOSIS — K219 Gastro-esophageal reflux disease without esophagitis: Secondary | ICD-10-CM

## 2018-03-17 NOTE — Patient Instructions (Signed)
It was a pleasure to see you today.  Continue same medications and return in 6 months. 

## 2018-03-21 ENCOUNTER — Other Ambulatory Visit: Payer: Self-pay | Admitting: Internal Medicine

## 2018-03-24 ENCOUNTER — Telehealth: Payer: Self-pay | Admitting: Internal Medicine

## 2018-03-24 DIAGNOSIS — J22 Unspecified acute lower respiratory infection: Secondary | ICD-10-CM

## 2018-03-24 MED ORDER — LEVOFLOXACIN 500 MG PO TABS: 500.0000 mg | ORAL_TABLET | Freq: Every day | ORAL | 0 refills | Status: DC

## 2018-03-24 NOTE — Telephone Encounter (Signed)
Called patients wife to advise of antibiotic being called in.  She'll pick it up.    Araceli, will you please send this to pharmacy please?   Thank you.

## 2018-03-24 NOTE — Telephone Encounter (Signed)
Lelon Frohlich is calling; states that patient is coughing with congestion x 2 days.  His sputum is colored.  No fever.  She wants to know if you will call something in for him since it is so hard to get him in/out of the car.  And, it turns into bronchitis so easy with him.  She just wanted to get him started on something before it did turn into bronchitis.    Pharmacy:  [facility]  Thank you.

## 2018-03-24 NOTE — Telephone Encounter (Signed)
Call in Levaquin 500 mg daily x 10 days 

## 2018-03-24 NOTE — Telephone Encounter (Signed)
rx was e-scribed.

## 2018-05-22 ENCOUNTER — Other Ambulatory Visit: Payer: Self-pay | Admitting: Internal Medicine

## 2018-05-22 DIAGNOSIS — I10 Essential (primary) hypertension: Secondary | ICD-10-CM

## 2018-05-29 ENCOUNTER — Ambulatory Visit: Payer: Medicare Other | Admitting: Nurse Practitioner

## 2018-06-08 ENCOUNTER — Ambulatory Visit: Payer: Medicare Other | Admitting: Neurology

## 2018-06-22 ENCOUNTER — Other Ambulatory Visit: Payer: Self-pay | Admitting: Internal Medicine

## 2018-08-24 ENCOUNTER — Other Ambulatory Visit: Payer: Medicare Other | Admitting: Internal Medicine

## 2018-08-28 ENCOUNTER — Other Ambulatory Visit: Payer: Self-pay

## 2018-08-28 ENCOUNTER — Encounter: Payer: Medicare Other | Admitting: Internal Medicine

## 2018-08-28 ENCOUNTER — Ambulatory Visit (INDEPENDENT_AMBULATORY_CARE_PROVIDER_SITE_OTHER): Payer: Medicare Other | Admitting: Internal Medicine

## 2018-08-28 ENCOUNTER — Encounter: Payer: Self-pay | Admitting: Internal Medicine

## 2018-08-28 DIAGNOSIS — H1013 Acute atopic conjunctivitis, bilateral: Secondary | ICD-10-CM | POA: Diagnosis not present

## 2018-08-28 DIAGNOSIS — G8929 Other chronic pain: Secondary | ICD-10-CM | POA: Diagnosis not present

## 2018-08-28 DIAGNOSIS — Z0000 Encounter for general adult medical examination without abnormal findings: Secondary | ICD-10-CM

## 2018-08-28 DIAGNOSIS — M545 Low back pain: Secondary | ICD-10-CM

## 2018-08-28 MED ORDER — TRAMADOL HCL 50 MG PO TABS: 50.0000 mg | ORAL_TABLET | Freq: Two times a day (BID) | ORAL | 0 refills | Status: DC | PRN

## 2018-08-28 MED ORDER — OLOPATADINE HCL 0.1 % OP SOLN: 1.0000 [drp] | Freq: Two times a day (BID) | OPHTHALMIC | 12 refills | Status: AC

## 2018-08-28 NOTE — Patient Instructions (Signed)
Tramadol 1 p.o. every 12 hours as needed back pain.  Patanol eyedrops use in each eye daily for itchy watery eyes.

## 2018-08-28 NOTE — Progress Notes (Addendum)
   Subjective:    Patient ID: Mitchell [name], male    DOB: July 19, 1934, 84 y.o.   MRN: [id number]  HPI 84 year old Male for annual Medicare wellness visit.  His wife is assisting him via telephone today.  He is identified as Mitchell [name] by 2 identifiers, a longstanding patient in this practice.  Due to the COVID-19 pandemic, this visit was attempted using audio and video telecommunications but failed because patient and wife did not have access to video capability.  We continued using audio alone.  He has an issue with hearing.  His wife thinks she may get him checked in the near future but he has been unwilling to consider wearing hearing aids previously.  He has a history of memory disturbance.  Wife thinks he may be a little worse having to stay and with the pandemic.  He enjoys getting around riding and visiting with friends.  They have not been doing much of that lately.  She does not think he is depressed.  He has issues with ambulation.  He has not fallen recently.  He has a left foot drop and chronic low back pain that inhibits his ability to ambulate.  He was going to physical therapy at [facility] physical therapy but had to stop once the pandemic cane.  He has issues ambulating at home from bed to chair, cannot really go up a flight of stairs due to left foot drop, his wife prepares his meals, he has issues bathing and showering and wife has to help him.  She has to help him get to the toilet and get off of the toilet.  He does have a raised toilet.  He no longer drives.  Not sexually active.  Some issues with memory mainly trying to remember names of friends from the past.  He has a smoke alarm in his residence, grab bars in the bathroom and no throw rugs.  Wife has refused to accept  help in the home from caregivers even though her daughter has offered to provide that for her.  Is having issues with coryza and wife thinks it may be allergic conjunctivitis.  Will prescribe  Patanol eyedrops.  Continues with chronic back pain.  Was enjoying going to physical therapy before pandemic.  Has not been out of the house for several weeks.  Physicians involved in his care currently include: Dr. Cathrine Muster  Dr. Vertell Limber- Neurosurgery-last seen Nov 2019.  Has not seen Orthopedist or Cardiologist in some time.  His immunizations are up to date as noted in [place]. Was given Rx for Shingrix in May 2019. This was to have been done at pharmacy.Do not see verification of this today in Epi  I do not want to pursue now until Covid abates.    Review of Systems see above     Objective:   Physical Exam  Not examined.  Was interviewed with the help of his wife For annual Medicare wellness visit.  CPE and office will be scheduled at another time    Assessment & Plan:  Annual Medicare wellness visit  20 minutes spent with patient and wife  Plan: In office CPE and lab work scheduled for late June.  For allergic conjunctivitis have prescribed Patanol eyedrops.  Refill tramadol for chronic back pain

## 2018-10-05 ENCOUNTER — Other Ambulatory Visit: Payer: Medicare Other | Admitting: Internal Medicine

## 2018-10-13 ENCOUNTER — Encounter: Payer: Medicare Other | Admitting: Internal Medicine

## 2018-11-21 ENCOUNTER — Other Ambulatory Visit: Payer: Self-pay | Admitting: Internal Medicine

## 2019-01-23 DIAGNOSIS — Z23 Encounter for immunization: Secondary | ICD-10-CM | POA: Diagnosis not present

## 2019-02-27 ENCOUNTER — Other Ambulatory Visit: Payer: Self-pay | Admitting: Internal Medicine

## 2019-02-27 DIAGNOSIS — I10 Essential (primary) hypertension: Secondary | ICD-10-CM

## 2019-03-13 ENCOUNTER — Other Ambulatory Visit: Payer: Self-pay | Admitting: Internal Medicine

## 2019-03-13 DIAGNOSIS — K219 Gastro-esophageal reflux disease without esophagitis: Secondary | ICD-10-CM

## 2019-03-13 NOTE — Telephone Encounter (Signed)
Schedule labs and OV for December and then CPE scheduled for May to get patient back on track.

## 2019-03-13 NOTE — Telephone Encounter (Signed)
Please call him. No recent labs this year. Had wellness visit but no CPE.

## 2019-03-29 ENCOUNTER — Other Ambulatory Visit: Payer: Self-pay

## 2019-03-29 ENCOUNTER — Other Ambulatory Visit: Payer: Medicare Other | Admitting: Internal Medicine

## 2019-03-29 DIAGNOSIS — M4727 Other spondylosis with radiculopathy, lumbosacral region: Secondary | ICD-10-CM | POA: Diagnosis not present

## 2019-03-29 DIAGNOSIS — E785 Hyperlipidemia, unspecified: Secondary | ICD-10-CM | POA: Diagnosis not present

## 2019-03-29 DIAGNOSIS — E119 Type 2 diabetes mellitus without complications: Secondary | ICD-10-CM

## 2019-03-29 DIAGNOSIS — I10 Essential (primary) hypertension: Secondary | ICD-10-CM

## 2019-03-29 DIAGNOSIS — K219 Gastro-esophageal reflux disease without esophagitis: Secondary | ICD-10-CM

## 2019-03-29 DIAGNOSIS — J45909 Unspecified asthma, uncomplicated: Secondary | ICD-10-CM | POA: Diagnosis not present

## 2019-03-29 DIAGNOSIS — R410 Disorientation, unspecified: Secondary | ICD-10-CM

## 2019-03-29 DIAGNOSIS — G4733 Obstructive sleep apnea (adult) (pediatric): Secondary | ICD-10-CM | POA: Diagnosis not present

## 2019-03-29 DIAGNOSIS — Z125 Encounter for screening for malignant neoplasm of prostate: Secondary | ICD-10-CM

## 2019-03-29 DIAGNOSIS — M5116 Intervertebral disc disorders with radiculopathy, lumbar region: Secondary | ICD-10-CM

## 2019-03-29 DIAGNOSIS — R413 Other amnesia: Secondary | ICD-10-CM | POA: Diagnosis not present

## 2019-03-29 DIAGNOSIS — Z0000 Encounter for general adult medical examination without abnormal findings: Secondary | ICD-10-CM

## 2019-03-29 DIAGNOSIS — M1990 Unspecified osteoarthritis, unspecified site: Secondary | ICD-10-CM

## 2019-03-30 ENCOUNTER — Ambulatory Visit (INDEPENDENT_AMBULATORY_CARE_PROVIDER_SITE_OTHER): Payer: Medicare Other | Admitting: Internal Medicine

## 2019-03-30 VITALS — BP 121/77 | Temp 98.7°F | Ht 74.0 in | Wt 310.0 lb

## 2019-03-30 DIAGNOSIS — E7800 Pure hypercholesterolemia, unspecified: Secondary | ICD-10-CM | POA: Diagnosis not present

## 2019-03-30 DIAGNOSIS — Z8709 Personal history of other diseases of the respiratory system: Secondary | ICD-10-CM

## 2019-03-30 DIAGNOSIS — M21371 Foot drop, right foot: Secondary | ICD-10-CM

## 2019-03-30 DIAGNOSIS — Z955 Presence of coronary angioplasty implant and graft: Secondary | ICD-10-CM

## 2019-03-30 DIAGNOSIS — G8929 Other chronic pain: Secondary | ICD-10-CM | POA: Diagnosis not present

## 2019-03-30 DIAGNOSIS — G4733 Obstructive sleep apnea (adult) (pediatric): Secondary | ICD-10-CM | POA: Diagnosis not present

## 2019-03-30 DIAGNOSIS — R7302 Impaired glucose tolerance (oral): Secondary | ICD-10-CM

## 2019-03-30 DIAGNOSIS — R413 Other amnesia: Secondary | ICD-10-CM

## 2019-03-30 DIAGNOSIS — M545 Low back pain: Secondary | ICD-10-CM | POA: Diagnosis not present

## 2019-03-30 DIAGNOSIS — I10 Essential (primary) hypertension: Secondary | ICD-10-CM | POA: Diagnosis not present

## 2019-03-30 LAB — LIPID PANEL
Cholesterol: 179 mg/dL
HDL: 39 mg/dL — ABNORMAL LOW
LDL Cholesterol (Calc): 120 mg/dL — ABNORMAL HIGH
Non-HDL Cholesterol (Calc): 140 mg/dL — ABNORMAL HIGH
Total CHOL/HDL Ratio: 4.6 (calc)
Triglycerides: 102 mg/dL

## 2019-03-30 LAB — HEMOGLOBIN A1C
Hgb A1c MFr Bld: 5.6 % of total Hgb (ref <5.7)
Mean Plasma Glucose: 114 (calc)
eAG (mmol/L): 6.3 (calc)

## 2019-03-30 LAB — COMPLETE METABOLIC PANEL WITH GFR
AG Ratio: 1.7 (calc) (ref 1.0–2.5)
ALT: 9 U/L (ref 9–46)
AST: 13 U/L (ref 10–35)
Albumin: 3.8 g/dL (ref 3.6–5.1)
Alkaline phosphatase (APISO): 97 U/L (ref 35–144)
BUN/Creatinine Ratio: 25 (calc) — ABNORMAL HIGH (ref 6–22)
BUN: 16 mg/dL (ref 7–25)
CO2: 27 mmol/L (ref 20–32)
Calcium: 8.8 mg/dL (ref 8.6–10.3)
Chloride: 101 mmol/L (ref 98–110)
Creat: 0.63 mg/dL — ABNORMAL LOW (ref 0.70–1.11)
GFR, Est African American: 106 mL/min/{1.73_m2} (ref >=60)
GFR, Est Non African American: 92 mL/min/{1.73_m2} (ref >=60)
Globulin: 2.3 g/dL (calc) (ref 1.9–3.7)
Glucose, Bld: 103 mg/dL — ABNORMAL HIGH (ref 65–99)
Potassium: 4.6 mmol/L (ref 3.5–5.3)
Sodium: 139 mmol/L (ref 135–146)
Total Bilirubin: 0.5 mg/dL (ref 0.2–1.2)
Total Protein: 6.1 g/dL (ref 6.1–8.1)

## 2019-03-30 LAB — CBC WITH DIFFERENTIAL/PLATELET
Absolute Monocytes: 826 cells/uL (ref 200–950)
Basophils Absolute: 20 cells/uL (ref 0–200)
Basophils Relative: 0.2 %
Eosinophils Absolute: 224 cells/uL (ref 15–500)
Eosinophils Relative: 2.2 %
HCT: 44.7 % (ref 38.5–50.0)
Hemoglobin: 14.8 g/dL (ref 13.2–17.1)
Lymphs Abs: 1969 cells/uL (ref 850–3900)
MCH: 31.7 pg (ref 27.0–33.0)
MCHC: 33.1 g/dL (ref 32.0–36.0)
MCV: 95.7 fL (ref 80.0–100.0)
MPV: 9.9 fL (ref 7.5–12.5)
Monocytes Relative: 8.1 %
Neutro Abs: 7160 cells/uL (ref 1500–7800)
Neutrophils Relative %: 70.2 %
Platelets: 238 10*3/uL (ref 140–400)
RBC: 4.67 10*6/uL (ref 4.20–5.80)
RDW: 12.5 % (ref 11.0–15.0)
Total Lymphocyte: 19.3 %
WBC: 10.2 10*3/uL (ref 3.8–10.8)

## 2019-03-30 LAB — PSA: PSA: 0.9 ng/mL (ref <=4.0)

## 2019-03-30 LAB — MICROALBUMIN / CREATININE URINE RATIO
Creatinine, Urine: 56 mg/dL (ref 20–320)
Microalb Creat Ratio: 57 mcg/mg creat — ABNORMAL HIGH (ref <30)
Microalb, Ur: 3.2 mg/dL

## 2019-03-30 NOTE — Progress Notes (Signed)
   Subjective:    Patient ID: Mitchell [name], male    DOB: 04/15/1935, 84 y.o.   MRN: [id number]  HPI 84 year old Male seen by interactive audio and video telecommunications due to the coronavirus pandemic.  He is identified using 2 identifiers as [name], a longstanding patient in this practice.  He is agreeable to visit in this format today.  His wife is also present.  He has been staying at home during the COVID-19 pandemic.  He requires a lot of assistance ambulating from his wife.  He used to take physical therapy but has not done so during the pandemic.  He has an issue with hearing.  He has memory disturbance.  He has seen neurologist for his memory.  He was tried on Aricept but currently is not taking it.   He has hypertension and hyperlipidemia.  Has asthma and uses as needed inhalers.  History of GE reflux treated with Protonix.  Takes MiraLAX for constipation and tramadol as needed for back pain which is chronic.  Wife reports that his blood pressure is excellent.  He has chronic right ankle pain and is seeing Dr. Doran Durand.  History of osteoarthritis in right ankle and foot and has chronic right foot drop.  He wears a brace for the foot drop.   Review of Systems see above     Objective:   Physical Exam Blood pressure recorded is 141/77.  Weight reported is 310 pounds and BMI 39.80       Assessment & Plan:  Morbid obesity-unable to exercise due to ambulation issues with foot drop and chronic back pain  Memory loss-was taking Aricept but no longer doing so.  Essential hypertension-stable  History of impaired glucose tolerance-hemoglobin A1c normal at 5.6% and previously was 5.7% a year ago.  Hyperlipidemia-LDL is elevated at 140-continue Zetia  Chronic back pain treated with tramadol and Mobic  GE reflux treated with PPI  History of B12 deficiency treated with normal B12.  Level has not been checked since 2018 needs to be checked at next visit  Plan: Have  scheduled in person physical exam from May 2021 along with Medicare wellness visit.  Continue current medications.  Take Covid vaccine when available.

## 2019-05-12 ENCOUNTER — Encounter: Payer: Self-pay | Admitting: Internal Medicine

## 2019-05-12 NOTE — Patient Instructions (Signed)
It was a pleasure to speak with you today by phone.  Continue current medications and follow-up in 6 months.  Take Covid vaccine when it is available.

## 2019-05-21 ENCOUNTER — Other Ambulatory Visit: Payer: Self-pay | Admitting: Internal Medicine

## 2019-05-21 DIAGNOSIS — I10 Essential (primary) hypertension: Secondary | ICD-10-CM

## 2019-06-13 ENCOUNTER — Other Ambulatory Visit: Payer: Self-pay | Admitting: Internal Medicine

## 2019-06-13 DIAGNOSIS — K219 Gastro-esophageal reflux disease without esophagitis: Secondary | ICD-10-CM

## 2019-06-21 ENCOUNTER — Other Ambulatory Visit: Payer: Self-pay | Admitting: Internal Medicine

## 2019-08-28 ENCOUNTER — Other Ambulatory Visit: Payer: Medicare Other | Admitting: Internal Medicine

## 2019-08-28 ENCOUNTER — Other Ambulatory Visit: Payer: Self-pay

## 2019-08-28 DIAGNOSIS — E7800 Pure hypercholesterolemia, unspecified: Secondary | ICD-10-CM

## 2019-08-28 DIAGNOSIS — R7302 Impaired glucose tolerance (oral): Secondary | ICD-10-CM | POA: Diagnosis not present

## 2019-08-28 DIAGNOSIS — G4733 Obstructive sleep apnea (adult) (pediatric): Secondary | ICD-10-CM

## 2019-08-28 DIAGNOSIS — I10 Essential (primary) hypertension: Secondary | ICD-10-CM | POA: Diagnosis not present

## 2019-08-28 DIAGNOSIS — M4727 Other spondylosis with radiculopathy, lumbosacral region: Secondary | ICD-10-CM

## 2019-08-28 DIAGNOSIS — G8929 Other chronic pain: Secondary | ICD-10-CM

## 2019-08-28 DIAGNOSIS — E119 Type 2 diabetes mellitus without complications: Secondary | ICD-10-CM | POA: Diagnosis not present

## 2019-08-28 DIAGNOSIS — R413 Other amnesia: Secondary | ICD-10-CM

## 2019-08-28 DIAGNOSIS — Z125 Encounter for screening for malignant neoplasm of prostate: Secondary | ICD-10-CM

## 2019-08-28 DIAGNOSIS — M545 Low back pain, unspecified: Secondary | ICD-10-CM

## 2019-08-28 DIAGNOSIS — M1990 Unspecified osteoarthritis, unspecified site: Secondary | ICD-10-CM

## 2019-08-28 DIAGNOSIS — Z8709 Personal history of other diseases of the respiratory system: Secondary | ICD-10-CM

## 2019-08-29 LAB — PSA: PSA: 0.8 ng/mL (ref <=4.0)

## 2019-08-29 LAB — CBC WITH DIFFERENTIAL/PLATELET
Absolute Monocytes: 842 cells/uL (ref 200–950)
Basophils Absolute: 40 cells/uL (ref 0–200)
Basophils Relative: 0.4 %
Eosinophils Absolute: 287 cells/uL (ref 15–500)
Eosinophils Relative: 2.9 %
HCT: 42.3 % (ref 38.5–50.0)
Hemoglobin: 14 g/dL (ref 13.2–17.1)
Lymphs Abs: 1713 cells/uL (ref 850–3900)
MCH: 31.5 pg (ref 27.0–33.0)
MCHC: 33.1 g/dL (ref 32.0–36.0)
MCV: 95.1 fL (ref 80.0–100.0)
MPV: 10.8 fL (ref 7.5–12.5)
Monocytes Relative: 8.5 %
Neutro Abs: 7019 cells/uL (ref 1500–7800)
Neutrophils Relative %: 70.9 %
Platelets: 252 10*3/uL (ref 140–400)
RBC: 4.45 10*6/uL (ref 4.20–5.80)
RDW: 12.9 % (ref 11.0–15.0)
Total Lymphocyte: 17.3 %
WBC: 9.9 10*3/uL (ref 3.8–10.8)

## 2019-08-29 LAB — HEMOGLOBIN A1C
Hgb A1c MFr Bld: 5.3 % of total Hgb (ref <5.7)
Mean Plasma Glucose: 105 (calc)
eAG (mmol/L): 5.8 (calc)

## 2019-08-29 LAB — COMPLETE METABOLIC PANEL WITH GFR
AG Ratio: 1.4 (calc) (ref 1.0–2.5)
ALT: 8 U/L — ABNORMAL LOW (ref 9–46)
AST: 14 U/L (ref 10–35)
Albumin: 3.7 g/dL (ref 3.6–5.1)
Alkaline phosphatase (APISO): 104 U/L (ref 35–144)
BUN/Creatinine Ratio: 27 (calc) — ABNORMAL HIGH (ref 6–22)
BUN: 18 mg/dL (ref 7–25)
CO2: 27 mmol/L (ref 20–32)
Calcium: 8.7 mg/dL (ref 8.6–10.3)
Chloride: 102 mmol/L (ref 98–110)
Creat: 0.67 mg/dL — ABNORMAL LOW (ref 0.70–1.11)
GFR, Est African American: 104 mL/min/{1.73_m2} (ref >=60)
GFR, Est Non African American: 89 mL/min/{1.73_m2} (ref >=60)
Globulin: 2.6 g/dL (calc) (ref 1.9–3.7)
Glucose, Bld: 104 mg/dL — ABNORMAL HIGH (ref 65–99)
Potassium: 4.8 mmol/L (ref 3.5–5.3)
Sodium: 138 mmol/L (ref 135–146)
Total Bilirubin: 0.5 mg/dL (ref 0.2–1.2)
Total Protein: 6.3 g/dL (ref 6.1–8.1)

## 2019-08-29 LAB — LIPID PANEL
Cholesterol: 160 mg/dL (ref <200)
HDL: 41 mg/dL (ref >=40)
LDL Cholesterol (Calc): 101 mg/dL (calc) — ABNORMAL HIGH
Non-HDL Cholesterol (Calc): 119 mg/dL (calc) (ref <130)
Total CHOL/HDL Ratio: 3.9 (calc) (ref <5.0)
Triglycerides: 87 mg/dL (ref <150)

## 2019-09-06 ENCOUNTER — Other Ambulatory Visit: Payer: Medicare Other | Admitting: Internal Medicine

## 2019-09-07 ENCOUNTER — Other Ambulatory Visit: Payer: Self-pay

## 2019-09-07 ENCOUNTER — Ambulatory Visit (INDEPENDENT_AMBULATORY_CARE_PROVIDER_SITE_OTHER): Payer: Medicare Other | Admitting: Internal Medicine

## 2019-09-07 ENCOUNTER — Encounter: Payer: Self-pay | Admitting: Internal Medicine

## 2019-09-07 VITALS — BP 100/70 | HR 70 | Ht 74.0 in

## 2019-09-07 DIAGNOSIS — M21371 Foot drop, right foot: Secondary | ICD-10-CM

## 2019-09-07 DIAGNOSIS — I10 Essential (primary) hypertension: Secondary | ICD-10-CM

## 2019-09-07 DIAGNOSIS — Z955 Presence of coronary angioplasty implant and graft: Secondary | ICD-10-CM

## 2019-09-07 DIAGNOSIS — R7302 Impaired glucose tolerance (oral): Secondary | ICD-10-CM

## 2019-09-07 DIAGNOSIS — Z0000 Encounter for general adult medical examination without abnormal findings: Secondary | ICD-10-CM

## 2019-09-07 DIAGNOSIS — R413 Other amnesia: Secondary | ICD-10-CM | POA: Diagnosis not present

## 2019-09-07 DIAGNOSIS — G8929 Other chronic pain: Secondary | ICD-10-CM

## 2019-09-07 DIAGNOSIS — Z8709 Personal history of other diseases of the respiratory system: Secondary | ICD-10-CM

## 2019-09-07 DIAGNOSIS — R609 Edema, unspecified: Secondary | ICD-10-CM

## 2019-09-07 DIAGNOSIS — Z789 Other specified health status: Secondary | ICD-10-CM

## 2019-09-07 DIAGNOSIS — G4733 Obstructive sleep apnea (adult) (pediatric): Secondary | ICD-10-CM
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
## 2019-09-08 NOTE — Patient Instructions (Addendum)
It was a pleasure to see you today.  Continue current medications and return in 1 year or as needed.  Have flu vaccine in the Fall.

## 2019-09-14 DIAGNOSIS — Z1283 Encounter for screening for malignant neoplasm of skin: Secondary | ICD-10-CM | POA: Diagnosis not present

## 2019-09-14 DIAGNOSIS — L570 Actinic keratosis: Secondary | ICD-10-CM | POA: Diagnosis not present

## 2019-09-14 DIAGNOSIS — D225 Melanocytic nevi of trunk: Secondary | ICD-10-CM | POA: Diagnosis not present

## 2019-09-14 DIAGNOSIS — L304 Erythema intertrigo: Secondary | ICD-10-CM | POA: Diagnosis not present

## 2019-09-14 DIAGNOSIS — L820 Inflamed seborrheic keratosis: Secondary | ICD-10-CM | POA: Diagnosis not present

## 2019-09-14 DIAGNOSIS — Z85820 Personal history of malignant melanoma of skin: Secondary | ICD-10-CM | POA: Diagnosis not present

## 2019-09-14 DIAGNOSIS — Z08 Encounter for follow-up examination after completed treatment for malignant neoplasm: Secondary | ICD-10-CM | POA: Diagnosis not present

## 2019-09-14 DIAGNOSIS — X32XXXD Exposure to sunlight, subsequent encounter: Secondary | ICD-10-CM | POA: Diagnosis not present

## 2019-10-11 ENCOUNTER — Other Ambulatory Visit: Payer: Self-pay | Admitting: Internal Medicine

## 2019-11-22 ENCOUNTER — Other Ambulatory Visit: Payer: Self-pay | Admitting: Internal Medicine

## 2019-11-22 DIAGNOSIS — I10 Essential (primary) hypertension: Secondary | ICD-10-CM

## 2019-11-26 ENCOUNTER — Other Ambulatory Visit: Payer: Self-pay | Admitting: Internal Medicine

## 2020-01-15 DIAGNOSIS — Z23 Encounter for immunization: Secondary | ICD-10-CM | POA: Diagnosis not present

## 2020-01-15 DIAGNOSIS — Z7189 Other specified counseling: Secondary | ICD-10-CM | POA: Diagnosis not present

## 2020-02-14 DIAGNOSIS — Z7189 Other specified counseling: Secondary | ICD-10-CM | POA: Diagnosis not present

## 2020-02-14 DIAGNOSIS — Z23 Encounter for immunization: Secondary | ICD-10-CM | POA: Diagnosis not present

## 2020-05-12 ENCOUNTER — Other Ambulatory Visit: Payer: Self-pay | Admitting: Internal Medicine

## 2020-05-12 DIAGNOSIS — I10 Essential (primary) hypertension: Secondary | ICD-10-CM

## 2020-06-24 ENCOUNTER — Other Ambulatory Visit: Payer: Self-pay | Admitting: Internal Medicine

## 2020-06-24 DIAGNOSIS — K219 Gastro-esophageal reflux disease without esophagitis: Secondary | ICD-10-CM

## 2020-06-24 NOTE — Telephone Encounter (Signed)
Medicare wellness and CPE due in May. Please book before refilling. He will need to come in. This cannot be done virtual.

## 2020-08-19 ENCOUNTER — Other Ambulatory Visit: Payer: Self-pay | Admitting: Internal Medicine

## 2020-08-19 DIAGNOSIS — I10 Essential (primary) hypertension: Secondary | ICD-10-CM

## 2020-08-29 ENCOUNTER — Other Ambulatory Visit: Payer: Medicare Other | Admitting: Internal Medicine

## 2020-08-29 ENCOUNTER — Other Ambulatory Visit: Payer: Self-pay

## 2020-08-29 DIAGNOSIS — E7800 Pure hypercholesterolemia, unspecified: Secondary | ICD-10-CM

## 2020-08-29 DIAGNOSIS — M545 Low back pain, unspecified: Secondary | ICD-10-CM | POA: Diagnosis not present

## 2020-08-29 DIAGNOSIS — N400 Enlarged prostate without lower urinary tract symptoms: Secondary | ICD-10-CM | POA: Diagnosis not present

## 2020-08-29 DIAGNOSIS — G4733 Obstructive sleep apnea (adult) (pediatric): Secondary | ICD-10-CM | POA: Diagnosis not present

## 2020-08-29 DIAGNOSIS — R413 Other amnesia: Secondary | ICD-10-CM | POA: Diagnosis not present

## 2020-08-29 DIAGNOSIS — I10 Essential (primary) hypertension: Secondary | ICD-10-CM

## 2020-08-29 DIAGNOSIS — R7302 Impaired glucose tolerance (oral): Secondary | ICD-10-CM

## 2020-08-29 DIAGNOSIS — E785 Hyperlipidemia, unspecified: Secondary | ICD-10-CM | POA: Diagnosis not present

## 2020-08-29 DIAGNOSIS — I2510 Atherosclerotic heart disease of native coronary artery without angina pectoris: Secondary | ICD-10-CM | POA: Diagnosis not present

## 2020-08-29 DIAGNOSIS — Z789 Other specified health status: Secondary | ICD-10-CM

## 2020-08-29 DIAGNOSIS — E119 Type 2 diabetes mellitus without complications: Secondary | ICD-10-CM | POA: Diagnosis not present

## 2020-08-29 DIAGNOSIS — Z0000 Encounter for general adult medical examination without abnormal findings: Secondary | ICD-10-CM

## 2020-08-29 DIAGNOSIS — R8290 Unspecified abnormal findings in urine: Secondary | ICD-10-CM | POA: Diagnosis not present

## 2020-08-30 LAB — CBC WITH DIFFERENTIAL/PLATELET
Absolute Monocytes: 810 cells/uL (ref 200–950)
Basophils Absolute: 20 cells/uL (ref 0–200)
Basophils Relative: 0.2 %
Eosinophils Absolute: 160 cells/uL (ref 15–500)
Eosinophils Relative: 1.6 %
HCT: 44.1 % (ref 38.5–50.0)
Hemoglobin: 14.3 g/dL (ref 13.2–17.1)
Lymphs Abs: 1830 cells/uL (ref 850–3900)
MCH: 30.3 pg (ref 27.0–33.0)
MCHC: 32.4 g/dL (ref 32.0–36.0)
MCV: 93.4 fL (ref 80.0–100.0)
MPV: 10 fL (ref 7.5–12.5)
Monocytes Relative: 8.1 %
Neutro Abs: 7180 cells/uL (ref 1500–7800)
Neutrophils Relative %: 71.8 %
Platelets: 217 10*3/uL (ref 140–400)
RBC: 4.72 10*6/uL (ref 4.20–5.80)
RDW: 13.2 % (ref 11.0–15.0)
Total Lymphocyte: 18.3 %
WBC: 10 10*3/uL (ref 3.8–10.8)

## 2020-08-30 LAB — COMPLETE METABOLIC PANEL WITH GFR
AG Ratio: 1.7 (calc) (ref 1.0–2.5)
ALT: 9 U/L (ref 9–46)
AST: 13 U/L (ref 10–35)
Albumin: 4 g/dL (ref 3.6–5.1)
Alkaline phosphatase (APISO): 100 U/L (ref 35–144)
BUN/Creatinine Ratio: 25 (calc) — ABNORMAL HIGH (ref 6–22)
BUN: 17 mg/dL (ref 7–25)
CO2: 29 mmol/L (ref 20–32)
Calcium: 9.1 mg/dL (ref 8.6–10.3)
Chloride: 100 mmol/L (ref 98–110)
Creat: 0.68 mg/dL — ABNORMAL LOW (ref 0.70–1.11)
GFR, Est African American: 102 mL/min/{1.73_m2} (ref >=60)
GFR, Est Non African American: 88 mL/min/{1.73_m2} (ref >=60)
Globulin: 2.3 g/dL (calc) (ref 1.9–3.7)
Glucose, Bld: 103 mg/dL — ABNORMAL HIGH (ref 65–99)
Potassium: 4.4 mmol/L (ref 3.5–5.3)
Sodium: 137 mmol/L (ref 135–146)
Total Bilirubin: 0.6 mg/dL (ref 0.2–1.2)
Total Protein: 6.3 g/dL (ref 6.1–8.1)

## 2020-08-30 LAB — LIPID PANEL
Cholesterol: 165 mg/dL (ref <200)
HDL: 41 mg/dL (ref >=40)
LDL Cholesterol (Calc): 108 mg/dL (calc) — ABNORMAL HIGH
Non-HDL Cholesterol (Calc): 124 mg/dL (calc) (ref <130)
Total CHOL/HDL Ratio: 4 (calc) (ref <5.0)
Triglycerides: 72 mg/dL (ref <150)

## 2020-08-30 LAB — HEMOGLOBIN A1C
Hgb A1c MFr Bld: 5.7 % of total Hgb — ABNORMAL HIGH (ref <5.7)
Mean Plasma Glucose: 117 mg/dL
eAG (mmol/L): 6.5 mmol/L

## 2020-08-30 LAB — PSA: PSA: 0.8 ng/mL (ref <=4.00)

## 2020-09-19 ENCOUNTER — Ambulatory Visit (INDEPENDENT_AMBULATORY_CARE_PROVIDER_SITE_OTHER): Payer: Medicare Other | Admitting: Internal Medicine

## 2020-09-19 ENCOUNTER — Encounter: Payer: Self-pay | Admitting: Internal Medicine

## 2020-09-19 ENCOUNTER — Other Ambulatory Visit: Payer: Self-pay

## 2020-09-19 VITALS — BP 100/70 | HR 66

## 2020-09-19 DIAGNOSIS — M21371 Foot drop, right foot: Secondary | ICD-10-CM

## 2020-09-19 DIAGNOSIS — I10 Essential (primary) hypertension: Secondary | ICD-10-CM | POA: Diagnosis not present

## 2020-09-19 DIAGNOSIS — R8290 Unspecified abnormal findings in urine: Secondary | ICD-10-CM | POA: Diagnosis not present

## 2020-09-19 DIAGNOSIS — M545 Low back pain, unspecified: Secondary | ICD-10-CM | POA: Diagnosis not present

## 2020-09-19 DIAGNOSIS — G4733 Obstructive sleep apnea (adult) (pediatric): Secondary | ICD-10-CM | POA: Diagnosis not present

## 2020-09-19 DIAGNOSIS — Z8709 Personal history of other diseases of the respiratory system: Secondary | ICD-10-CM | POA: Diagnosis not present

## 2020-09-19 DIAGNOSIS — G8929 Other chronic pain: Secondary | ICD-10-CM | POA: Diagnosis not present

## 2020-09-19 DIAGNOSIS — R413 Other amnesia: Secondary | ICD-10-CM

## 2020-09-19 DIAGNOSIS — Z0000 Encounter for general adult medical examination without abnormal findings: Secondary | ICD-10-CM | POA: Diagnosis not present

## 2020-09-19 DIAGNOSIS — Z789 Other specified health status: Secondary | ICD-10-CM | POA: Diagnosis not present

## 2020-09-19 DIAGNOSIS — R609 Edema, unspecified: Secondary | ICD-10-CM

## 2020-09-19 DIAGNOSIS — R7302 Impaired glucose tolerance (oral): Secondary | ICD-10-CM | POA: Diagnosis not present

## 2020-09-19 LAB — POCT URINALYSIS DIPSTICK
Appearance: ABNORMAL
Bilirubin, UA: NEGATIVE
Glucose, UA: NEGATIVE
Ketones, UA: NEGATIVE
Nitrite, UA: POSITIVE
Odor: ABNORMAL
Protein, UA: POSITIVE — AB
Spec Grav, UA: 1.02 (ref 1.010–1.025)
Urobilinogen, UA: 0.2 E.U./dL
pH, UA: 6 (ref 5.0–8.0)

## 2020-09-19 MED ORDER — LEVOFLOXACIN 250 MG PO TABS: 250.0000 mg | ORAL_TABLET | Freq: Every day | ORAL | 0 refills | Status: DC

## 2020-09-19 MED ORDER — PAROXETINE HCL 10 MG PO TABS: 10.0000 mg | ORAL_TABLET | Freq: Every day | ORAL | 5 refills | Status: AC

## 2020-09-19 NOTE — Patient Instructions (Addendum)
Referral back to Dr. Doran Durand at [facility] regarding orthopedic devices on legs.Has tried PT with [facility]. Wife may call [phone]  [phone] for appointment. He appears to have UTI- started on Levaquin 250 mg daily x 10 days. Dose reduced due to age and renal functions.
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
## 2020-09-22 ENCOUNTER — Telehealth: Payer: Self-pay | Admitting: Internal Medicine

## 2020-09-22 ENCOUNTER — Other Ambulatory Visit: Payer: Self-pay | Admitting: Internal Medicine

## 2020-09-22 ENCOUNTER — Other Ambulatory Visit: Payer: Self-pay

## 2020-09-22 DIAGNOSIS — K219 Gastro-esophageal reflux disease without esophagitis: Secondary | ICD-10-CM

## 2020-09-22 LAB — URINE CULTURE
MICRO NUMBER:: 11966459
SPECIMEN QUALITY:: ADEQUATE

## 2020-09-22 LAB — URINALYSIS, MICROSCOPIC ONLY
RBC / HPF: NONE SEEN /HPF (ref 0–2)
WBC, UA: >=60 /HPF — AB (ref 0–5)

## 2020-09-22 LAB — MICROALBUMIN / CREATININE URINE RATIO
Creatinine, Urine: 138 mg/dL (ref 20–320)
Microalb Creat Ratio: 25 mcg/mg creat (ref <30)
Microalb, Ur: 3.5 mg/dL

## 2020-09-22 MED ORDER — NITROFURANTOIN MACROCRYSTAL 100 MG PO CAPS: 100.0000 mg | ORAL_CAPSULE | Freq: Two times a day (BID) | ORAL | 0 refills | Status: DC

## 2020-09-22 NOTE — Telephone Encounter (Signed)
Called to give Lelon Frohlich information about [facility] - Dr Doran Durand [phone] -  Re Legs and Leg braces  Lelon Frohlich also said within 2 days of Hermon taking the Levaquin she notice a big difference in his mind.  She said Thank You so much.

## 2020-10-03 ENCOUNTER — Other Ambulatory Visit: Payer: Self-pay | Admitting: Internal Medicine

## 2020-10-07 ENCOUNTER — Other Ambulatory Visit (INDEPENDENT_AMBULATORY_CARE_PROVIDER_SITE_OTHER): Payer: Medicare Other

## 2020-10-07 DIAGNOSIS — N390 Urinary tract infection, site not specified: Secondary | ICD-10-CM

## 2020-10-07 DIAGNOSIS — B9620 Unspecified Escherichia coli [E. coli] as the cause of diseases classified elsewhere: Secondary | ICD-10-CM | POA: Diagnosis not present

## 2020-10-07 LAB — POCT URINALYSIS DIPSTICK
Bilirubin, UA: NEGATIVE
Glucose, UA: NEGATIVE
Ketones, UA: NEGATIVE
Nitrite, UA: POSITIVE
Protein, UA: NEGATIVE
Spec Grav, UA: 1.015 (ref 1.010–1.025)
Urobilinogen, UA: 0.2 E.U./dL
pH, UA: 6 (ref 5.0–8.0)

## 2020-10-09 ENCOUNTER — Telehealth (INDEPENDENT_AMBULATORY_CARE_PROVIDER_SITE_OTHER): Payer: Medicare Other | Admitting: Internal Medicine

## 2020-10-09 ENCOUNTER — Encounter: Payer: Self-pay | Admitting: Internal Medicine

## 2020-10-09 DIAGNOSIS — N390 Urinary tract infection, site not specified: Secondary | ICD-10-CM | POA: Diagnosis not present

## 2020-10-09 DIAGNOSIS — B9620 Unspecified Escherichia coli [E. coli] as the cause of diseases classified elsewhere: Secondary | ICD-10-CM | POA: Diagnosis not present

## 2020-10-09 LAB — URINE CULTURE
MICRO NUMBER:: 12032247
SPECIMEN QUALITY:: ADEQUATE

## 2020-10-09 LAB — URINALYSIS, MICROSCOPIC ONLY
Hyaline Cast: NONE SEEN /LPF
Squamous Epithelial / HPF: NONE SEEN /HPF (ref <=5)
WBC, UA: >=60 /HPF — AB (ref 0–5)

## 2020-10-09 MED ORDER — SULFAMETHOXAZOLE-TRIMETHOPRIM 800-160 MG PO TABS: 1.0000 | ORAL_TABLET | Freq: Two times a day (BID) | ORAL | 0 refills | Status: AC

## 2020-10-09 NOTE — Telephone Encounter (Addendum)
Phone call to wife with results of Mr. Stancil urine culture. He has memory loss and cannot remember what I would be saying. They are at home and I am at my office. They are agreeable to visit in this format today.  Wife reports that patient's attitude seemed to improve after being treated for E. coli UTI with quinolone (Levaquin) 250 mg daily for 10 days starting on June 3.  She has started giving him some melatonin and he seems to be resting better and less agitated.  She reports that he seems less terrible when on antibiotics.  She brought a urine specimen from home which was a clean-catch on June 21.  Culture grew E. coli once again but this time organism is resistant to Cipro and Levaquin.  Organism is sensitive to  Sulfa which we will try at this time instead.  She will bring another sterile clean-catch urine specimen in couple of weeks.  If he continues with these urinary tract infections he will need to see urologist but wife wants to try treating him  one additional time.  She is his sole caretaker at this present time.  Her daughter and son-in-law are supportive.  Today we are prescribing  Septra DS twice daily for 10 days.  Follow-up in 2 weeks with urine specimen and virtual/phone visit if needed.  Time spent on phone with wife reviewing situation and discussing recommendations is 10 minutes.

## 2020-11-10 ENCOUNTER — Encounter: Payer: Self-pay | Admitting: Internal Medicine

## 2020-11-10 ENCOUNTER — Other Ambulatory Visit: Payer: Self-pay

## 2020-11-10 ENCOUNTER — Other Ambulatory Visit (INDEPENDENT_AMBULATORY_CARE_PROVIDER_SITE_OTHER): Payer: Medicare Other

## 2020-11-10 ENCOUNTER — Ambulatory Visit: Payer: Medicare Other | Admitting: Internal Medicine

## 2020-11-10 ENCOUNTER — Telehealth (INDEPENDENT_AMBULATORY_CARE_PROVIDER_SITE_OTHER): Payer: Medicare Other | Admitting: Internal Medicine

## 2020-11-10 DIAGNOSIS — N390 Urinary tract infection, site not specified: Secondary | ICD-10-CM

## 2020-11-10 DIAGNOSIS — R300 Dysuria: Secondary | ICD-10-CM

## 2020-11-10 DIAGNOSIS — B9620 Unspecified Escherichia coli [E. coli] as the cause of diseases classified elsewhere: Secondary | ICD-10-CM

## 2020-11-10 DIAGNOSIS — R8290 Unspecified abnormal findings in urine: Secondary | ICD-10-CM

## 2020-11-10 LAB — POCT URINALYSIS DIPSTICK
Bilirubin, UA: NEGATIVE
Glucose, UA: NEGATIVE
Ketones, UA: NEGATIVE
Nitrite, UA: NEGATIVE
Protein, UA: POSITIVE — AB
Spec Grav, UA: 1.015 (ref 1.010–1.025)
Urobilinogen, UA: 0.2 E.U./dL
pH, UA: 5 (ref 5.0–8.0)

## 2020-11-10 MED ORDER — AMOXICILLIN-POT CLAVULANATE 500-125 MG PO TABS: 1.0000 | ORAL_TABLET | Freq: Two times a day (BID) | ORAL | 0 refills | Status: AC

## 2020-11-10 NOTE — Telephone Encounter (Signed)
Phone call to patient's wife, Raeburn Tokuda.  Patient is in her car and I am at my office.  I am not speaking with patient personally today because he has a memory disorder and may not recall what is said.  Patient and his wife were agreeable to visit in this format today.  Patient's wife brought a urine specimen by it that was clean-catch earlier today.  Patient still has signs of persistent urinary tract infection with 3+ LE being present.  He has been treated over the past few weeks with Levaquin which did not clear his urine and subsequently Septra.  Each time culture grew E. coli.  Septra was prescribed for 10 days twice daily.  Levaquin was prescribed 250 mg daily for 10 days.   Wife can tell when he has a UTI but is because he seems more lethargic and at times confused.  She says his urine cleared up initially on each antibiotic that has been prescribed but after a couple of weeks the urine became cloudy again.  He is not febrile according to his wife.  Has no nausea or vomiting.  Does not complain of dysuria.  He has seen Dr. Odis Luster, Urologist at [name] and was to have had an anterior urethral stricture dilated but wife says that symptoms improved and procedure was never performed.  We noted issues abnormal urinalysis when he was here for Medicare wellness visit and health maintenance exam on June 3.  Therefore, we will make referral back to urology at [name] with office here in [place].  I am concerned his stricture needs to be dilated.  He will be placed on Augmentin 500 mg twice daily for 10 days.  Time spent on phone with wife is 15 minutes.

## 2020-11-11 DIAGNOSIS — R8290 Unspecified abnormal findings in urine: Secondary | ICD-10-CM | POA: Diagnosis not present

## 2020-11-11 DIAGNOSIS — R300 Dysuria: Secondary | ICD-10-CM | POA: Diagnosis not present

## 2020-11-11 NOTE — Addendum Note (Signed)
Addended by: Mady Haagensen on: 11/11/2020 12:40 PM   Modules accepted: Orders

## 2020-11-12 LAB — URINE CULTURE
MICRO NUMBER:: 12163729
SPECIMEN QUALITY:: ADEQUATE

## 2020-11-12 LAB — URINALYSIS, MICROSCOPIC ONLY

## 2020-12-09 ENCOUNTER — Other Ambulatory Visit: Payer: Self-pay | Admitting: Internal Medicine

## 2021-01-23 DIAGNOSIS — E6609 Other obesity due to excess calories: Secondary | ICD-10-CM | POA: Diagnosis not present

## 2021-01-23 DIAGNOSIS — N35812 Other urethral bulbous stricture, male: Secondary | ICD-10-CM | POA: Diagnosis not present

## 2021-01-23 DIAGNOSIS — Z87440 Personal history of urinary (tract) infections: Secondary | ICD-10-CM | POA: Diagnosis not present

## 2021-01-23 DIAGNOSIS — B9620 Unspecified Escherichia coli [E. coli] as the cause of diseases classified elsewhere: Secondary | ICD-10-CM | POA: Diagnosis not present

## 2021-01-23 DIAGNOSIS — K5909 Other constipation: Secondary | ICD-10-CM | POA: Diagnosis not present

## 2021-01-23 DIAGNOSIS — R82998 Other abnormal findings in urine: Secondary | ICD-10-CM | POA: Diagnosis not present

## 2021-01-28 DIAGNOSIS — Z23 Encounter for immunization: Secondary | ICD-10-CM | POA: Diagnosis not present

## 2021-03-22 ENCOUNTER — Telehealth: Payer: Self-pay | Admitting: Internal Medicine

## 2021-03-22 NOTE — Telephone Encounter (Signed)
Patient has been going to [facility] for some time now and staff there is recommending [name] for decreased mobility. Fax # is [phone]  Family is requesting PETE see Mr. Combes per [facility] written.  MJB, MD

## 2021-03-25 DIAGNOSIS — K219 Gastro-esophageal reflux disease without esophagitis: Secondary | ICD-10-CM | POA: Diagnosis not present

## 2021-03-25 DIAGNOSIS — M21371 Foot drop, right foot: Secondary | ICD-10-CM | POA: Diagnosis not present

## 2021-03-25 DIAGNOSIS — R296 Repeated falls: Secondary | ICD-10-CM | POA: Diagnosis not present

## 2021-03-25 DIAGNOSIS — G3184 Mild cognitive impairment, so stated: Secondary | ICD-10-CM | POA: Diagnosis not present

## 2021-03-25 DIAGNOSIS — R269 Unspecified abnormalities of gait and mobility: Secondary | ICD-10-CM | POA: Diagnosis not present

## 2021-03-25 DIAGNOSIS — E669 Obesity, unspecified: Secondary | ICD-10-CM | POA: Diagnosis not present

## 2021-03-25 DIAGNOSIS — I10 Essential (primary) hypertension: Secondary | ICD-10-CM | POA: Diagnosis not present

## 2021-03-25 DIAGNOSIS — E785 Hyperlipidemia, unspecified: Secondary | ICD-10-CM | POA: Diagnosis not present

## 2021-03-26 NOTE — Telephone Encounter (Signed)
Faxed referral to [facility] has called back to say they need updated office note stating patient needs home Health Physical therapy and it can be a Video Visit. She also said if patient does not have a computer we can set up video visit with them and Laurey Arrow with [facility] can be there with his computer to assist with Video Visit.  [place] [phone]

## 2021-03-30 ENCOUNTER — Telehealth: Payer: Self-pay

## 2021-03-30 NOTE — Telephone Encounter (Signed)
Left message for patient to return call.

## 2021-03-30 NOTE — Telephone Encounter (Signed)
Patient has been set up for physical therapy and they have told her she needs a follow up with the provider. She was asking for a virtual visit but didn't want to set up his mychart. She then asked if we could do a phone visit or a car visit so he doesn't have to get out of the car. Please advise. Needs in the next couple of weeks.

## 2021-03-31 DIAGNOSIS — M4727 Other spondylosis with radiculopathy, lumbosacral region: Secondary | ICD-10-CM

## 2021-03-31 DIAGNOSIS — R269 Unspecified abnormalities of gait and mobility: Secondary | ICD-10-CM | POA: Diagnosis not present

## 2021-03-31 DIAGNOSIS — R296 Repeated falls: Secondary | ICD-10-CM | POA: Diagnosis not present

## 2021-03-31 DIAGNOSIS — Z8739 Personal history of other diseases of the musculoskeletal system and connective tissue: Secondary | ICD-10-CM

## 2021-03-31 DIAGNOSIS — M1990 Unspecified osteoarthritis, unspecified site: Secondary | ICD-10-CM

## 2021-03-31 DIAGNOSIS — G3184 Mild cognitive impairment, so stated: Secondary | ICD-10-CM | POA: Diagnosis not present

## 2021-03-31 DIAGNOSIS — M21371 Foot drop, right foot: Secondary | ICD-10-CM | POA: Diagnosis not present

## 2021-03-31 DIAGNOSIS — M5116 Intervertebral disc disorders with radiculopathy, lumbar region: Secondary | ICD-10-CM

## 2021-03-31 DIAGNOSIS — Z7401 Bed confinement status: Secondary | ICD-10-CM

## 2021-03-31 DIAGNOSIS — R239 Unspecified skin changes: Secondary | ICD-10-CM

## 2021-03-31 DIAGNOSIS — I10 Essential (primary) hypertension: Secondary | ICD-10-CM | POA: Diagnosis not present

## 2021-03-31 DIAGNOSIS — E785 Hyperlipidemia, unspecified: Secondary | ICD-10-CM | POA: Diagnosis not present

## 2021-03-31 NOTE — Telephone Encounter (Signed)
See other encounter that Mariann Laster started.

## 2021-03-31 NOTE — Telephone Encounter (Signed)
Ann called back and I let her know that I have been working with Mitchell [name] at [facility] to get Mitchell [name] to come out an assist with Video visit for the physical therapy video visit on 04/08/2021. She verbalized understanding.

## 2021-03-31 NOTE — Telephone Encounter (Signed)
Spoke with Tammy at [facility] today to let her know all of the orders did not come in today, so she is going to fax or bring them by. I also let her know that Dr Renold Genta is okay with Laurey Arrow helping the [facility] with a video visit so she is going to see if they can work out something for next Wednesday.

## 2021-03-31 NOTE — Telephone Encounter (Signed)
Tammy called back and schedule Video visit for Mr Touhey for next Wednesday

## 2021-04-02 DIAGNOSIS — G3184 Mild cognitive impairment, so stated: Secondary | ICD-10-CM | POA: Diagnosis not present

## 2021-04-02 DIAGNOSIS — R296 Repeated falls: Secondary | ICD-10-CM | POA: Diagnosis not present

## 2021-04-02 DIAGNOSIS — E785 Hyperlipidemia, unspecified: Secondary | ICD-10-CM | POA: Diagnosis not present

## 2021-04-02 DIAGNOSIS — R269 Unspecified abnormalities of gait and mobility: Secondary | ICD-10-CM | POA: Diagnosis not present

## 2021-04-02 DIAGNOSIS — M21371 Foot drop, right foot: Secondary | ICD-10-CM | POA: Diagnosis not present

## 2021-04-02 DIAGNOSIS — I10 Essential (primary) hypertension: Secondary | ICD-10-CM | POA: Diagnosis not present

## 2021-04-07 DIAGNOSIS — M21371 Foot drop, right foot: Secondary | ICD-10-CM | POA: Diagnosis not present

## 2021-04-07 DIAGNOSIS — R296 Repeated falls: Secondary | ICD-10-CM | POA: Diagnosis not present

## 2021-04-07 DIAGNOSIS — G3184 Mild cognitive impairment, so stated: Secondary | ICD-10-CM | POA: Diagnosis not present

## 2021-04-07 DIAGNOSIS — I10 Essential (primary) hypertension: Secondary | ICD-10-CM | POA: Diagnosis not present

## 2021-04-07 DIAGNOSIS — E785 Hyperlipidemia, unspecified: Secondary | ICD-10-CM | POA: Diagnosis not present

## 2021-04-07 DIAGNOSIS — R269 Unspecified abnormalities of gait and mobility: Secondary | ICD-10-CM | POA: Diagnosis not present

## 2021-04-08 ENCOUNTER — Encounter: Payer: Self-pay | Admitting: Internal Medicine

## 2021-04-08 ENCOUNTER — Other Ambulatory Visit: Payer: Self-pay

## 2021-04-08 ENCOUNTER — Telehealth (INDEPENDENT_AMBULATORY_CARE_PROVIDER_SITE_OTHER): Payer: Medicare Other | Admitting: Internal Medicine

## 2021-04-08 VITALS — BP 124/72 | HR 68 | Temp 97.9°F | Resp 18

## 2021-04-08 DIAGNOSIS — I10 Essential (primary) hypertension: Secondary | ICD-10-CM | POA: Diagnosis not present

## 2021-04-08 DIAGNOSIS — Z87448 Personal history of other diseases of urinary system: Secondary | ICD-10-CM

## 2021-04-08 DIAGNOSIS — R609 Edema, unspecified: Secondary | ICD-10-CM | POA: Diagnosis not present

## 2021-04-08 DIAGNOSIS — R269 Unspecified abnormalities of gait and mobility: Secondary | ICD-10-CM | POA: Diagnosis not present

## 2021-04-08 DIAGNOSIS — M21371 Foot drop, right foot: Secondary | ICD-10-CM

## 2021-04-08 DIAGNOSIS — Z8639 Personal history of other endocrine, nutritional and metabolic disease: Secondary | ICD-10-CM | POA: Diagnosis not present

## 2021-04-08 DIAGNOSIS — K5900 Constipation, unspecified: Secondary | ICD-10-CM | POA: Diagnosis not present

## 2021-04-08 DIAGNOSIS — R413 Other amnesia: Secondary | ICD-10-CM | POA: Diagnosis not present

## 2021-04-08 DIAGNOSIS — R7302 Impaired glucose tolerance (oral): Secondary | ICD-10-CM

## 2021-04-08 DIAGNOSIS — M545 Low back pain, unspecified: Secondary | ICD-10-CM

## 2021-04-08 DIAGNOSIS — Z955 Presence of coronary angioplasty implant and graft: Secondary | ICD-10-CM

## 2021-04-08 DIAGNOSIS — R296 Repeated falls: Secondary | ICD-10-CM | POA: Diagnosis not present

## 2021-04-08 DIAGNOSIS — R262 Difficulty in walking, not elsewhere classified: Secondary | ICD-10-CM | POA: Diagnosis not present

## 2021-04-08 DIAGNOSIS — G4733 Obstructive sleep apnea (adult) (pediatric): Secondary | ICD-10-CM | POA: Diagnosis not present

## 2021-04-08 DIAGNOSIS — Z789 Other specified health status: Secondary | ICD-10-CM | POA: Diagnosis not present

## 2021-04-08 DIAGNOSIS — G3184 Mild cognitive impairment, so stated: Secondary | ICD-10-CM | POA: Diagnosis not present

## 2021-04-08 DIAGNOSIS — E785 Hyperlipidemia, unspecified: Secondary | ICD-10-CM | POA: Diagnosis not present

## 2021-04-08 DIAGNOSIS — G8929 Other chronic pain: Secondary | ICD-10-CM

## 2021-04-08 NOTE — Patient Instructions (Signed)
Virtual visit conducted today with physical therapist, patient and patient's wife present.  Orders to be faxed to my office from home physical therapy company for my review and signature.  We are in agreement with care plan.
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
## 2021-04-09 ENCOUNTER — Telehealth: Payer: Self-pay | Admitting: Internal Medicine

## 2021-04-09 NOTE — Telephone Encounter (Signed)
Faxed signed Certified Brenda Orders to [facility] [phone], phone [phone]  Order # [id number]  Certification Period 03/25/2021 to 05/23/2021

## 2021-04-14 DIAGNOSIS — I10 Essential (primary) hypertension: Secondary | ICD-10-CM | POA: Diagnosis not present

## 2021-04-14 DIAGNOSIS — G3184 Mild cognitive impairment, so stated: Secondary | ICD-10-CM | POA: Diagnosis not present

## 2021-04-14 DIAGNOSIS — R296 Repeated falls: Secondary | ICD-10-CM | POA: Diagnosis not present

## 2021-04-14 DIAGNOSIS — R269 Unspecified abnormalities of gait and mobility: Secondary | ICD-10-CM | POA: Diagnosis not present

## 2021-04-14 DIAGNOSIS — E785 Hyperlipidemia, unspecified: Secondary | ICD-10-CM | POA: Diagnosis not present

## 2021-04-14 DIAGNOSIS — M21371 Foot drop, right foot: Secondary | ICD-10-CM | POA: Diagnosis not present

## 2021-04-22 DIAGNOSIS — R296 Repeated falls: Secondary | ICD-10-CM | POA: Diagnosis not present

## 2021-04-22 DIAGNOSIS — I10 Essential (primary) hypertension: Secondary | ICD-10-CM | POA: Diagnosis not present

## 2021-04-22 DIAGNOSIS — R269 Unspecified abnormalities of gait and mobility: Secondary | ICD-10-CM | POA: Diagnosis not present

## 2021-04-22 DIAGNOSIS — E785 Hyperlipidemia, unspecified: Secondary | ICD-10-CM | POA: Diagnosis not present

## 2021-04-22 DIAGNOSIS — M21371 Foot drop, right foot: Secondary | ICD-10-CM | POA: Diagnosis not present

## 2021-04-22 DIAGNOSIS — G3184 Mild cognitive impairment, so stated: Secondary | ICD-10-CM | POA: Diagnosis not present

## 2021-04-24 DIAGNOSIS — E669 Obesity, unspecified: Secondary | ICD-10-CM | POA: Diagnosis not present

## 2021-04-24 DIAGNOSIS — G3184 Mild cognitive impairment, so stated: Secondary | ICD-10-CM | POA: Diagnosis not present

## 2021-04-24 DIAGNOSIS — R296 Repeated falls: Secondary | ICD-10-CM | POA: Diagnosis not present

## 2021-04-24 DIAGNOSIS — I10 Essential (primary) hypertension: Secondary | ICD-10-CM | POA: Diagnosis not present

## 2021-04-24 DIAGNOSIS — K219 Gastro-esophageal reflux disease without esophagitis: Secondary | ICD-10-CM | POA: Diagnosis not present

## 2021-04-24 DIAGNOSIS — R269 Unspecified abnormalities of gait and mobility: Secondary | ICD-10-CM | POA: Diagnosis not present

## 2021-04-24 DIAGNOSIS — M21371 Foot drop, right foot: Secondary | ICD-10-CM | POA: Diagnosis not present

## 2021-04-24 DIAGNOSIS — E785 Hyperlipidemia, unspecified: Secondary | ICD-10-CM | POA: Diagnosis not present

## 2021-04-29 DIAGNOSIS — G3184 Mild cognitive impairment, so stated: Secondary | ICD-10-CM | POA: Diagnosis not present

## 2021-04-29 DIAGNOSIS — E785 Hyperlipidemia, unspecified: Secondary | ICD-10-CM | POA: Diagnosis not present

## 2021-04-29 DIAGNOSIS — R269 Unspecified abnormalities of gait and mobility: Secondary | ICD-10-CM | POA: Diagnosis not present

## 2021-04-29 DIAGNOSIS — M21371 Foot drop, right foot: Secondary | ICD-10-CM | POA: Diagnosis not present

## 2021-04-29 DIAGNOSIS — R296 Repeated falls: Secondary | ICD-10-CM | POA: Diagnosis not present

## 2021-04-29 DIAGNOSIS — I10 Essential (primary) hypertension: Secondary | ICD-10-CM | POA: Diagnosis not present

## 2021-05-08 DIAGNOSIS — E785 Hyperlipidemia, unspecified: Secondary | ICD-10-CM | POA: Diagnosis not present

## 2021-05-08 DIAGNOSIS — M21371 Foot drop, right foot: Secondary | ICD-10-CM | POA: Diagnosis not present

## 2021-05-08 DIAGNOSIS — R296 Repeated falls: Secondary | ICD-10-CM | POA: Diagnosis not present

## 2021-05-08 DIAGNOSIS — I10 Essential (primary) hypertension: Secondary | ICD-10-CM | POA: Diagnosis not present

## 2021-05-08 DIAGNOSIS — R269 Unspecified abnormalities of gait and mobility: Secondary | ICD-10-CM | POA: Diagnosis not present

## 2021-05-08 DIAGNOSIS — G3184 Mild cognitive impairment, so stated: Secondary | ICD-10-CM | POA: Diagnosis not present

## 2021-05-11 ENCOUNTER — Other Ambulatory Visit: Payer: Self-pay | Admitting: Internal Medicine

## 2021-05-11 DIAGNOSIS — I10 Essential (primary) hypertension: Secondary | ICD-10-CM

## 2021-05-12 DIAGNOSIS — R296 Repeated falls: Secondary | ICD-10-CM | POA: Diagnosis not present

## 2021-05-12 DIAGNOSIS — I10 Essential (primary) hypertension: Secondary | ICD-10-CM | POA: Diagnosis not present

## 2021-05-12 DIAGNOSIS — M21371 Foot drop, right foot: Secondary | ICD-10-CM | POA: Diagnosis not present

## 2021-05-12 DIAGNOSIS — G3184 Mild cognitive impairment, so stated: Secondary | ICD-10-CM | POA: Diagnosis not present

## 2021-05-12 DIAGNOSIS — R269 Unspecified abnormalities of gait and mobility: Secondary | ICD-10-CM | POA: Diagnosis not present

## 2021-05-12 DIAGNOSIS — E785 Hyperlipidemia, unspecified: Secondary | ICD-10-CM | POA: Diagnosis not present

## 2021-05-20 ENCOUNTER — Other Ambulatory Visit: Payer: Self-pay | Admitting: Internal Medicine

## 2021-05-20 DIAGNOSIS — I10 Essential (primary) hypertension: Secondary | ICD-10-CM

## 2021-06-05 ENCOUNTER — Ambulatory Visit: Payer: Medicare Other | Admitting: Nurse Practitioner

## 2021-06-09 ENCOUNTER — Other Ambulatory Visit: Payer: Self-pay | Admitting: Internal Medicine

## 2021-06-24 ENCOUNTER — Telehealth: Payer: Self-pay | Admitting: Internal Medicine

## 2021-06-24 NOTE — Telephone Encounter (Signed)
Called back to confirm ?

## 2021-06-24 NOTE — Telephone Encounter (Signed)
LVM that appointment time changed from 2:00 to 3:00 ?

## 2021-07-23 ENCOUNTER — Telehealth: Payer: Self-pay | Admitting: Internal Medicine
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
## 2021-07-23 NOTE — Telephone Encounter (Signed)
Mitchell [name] ?[phone] ? ?FYI ? ?Ann called to say she was unable to get [name] up to the side of bed today, she had to call for help, his dementia is getting worse. He is not eating today. She had to call for assistance she was asking what should she do if he needed to see someone, she did not think she could get him here. Should she call 911. She first LVM, then I called her back to confirm what she was saying or needing. ?She did say that [name] came over and helped her, she is not sure yet if she is going to send him to the emergency room, she wants to give him some more time to get back to himself. She may want to look int Palliative Care in the near future for him. Things are getting much more difficult for her to manage. ? ?[name] had also called and LVM right after her mother asking for Call back she was inquiring about Palliative care for her dad, since he seems to be declining, she said she would be happy to come to the office for a visit to discuss. ?

## 2021-07-23 NOTE — Telephone Encounter (Signed)
LVM with Dr Minus Liberty to see if she though some Haldol or Zyprexa would help her dad and to maybe try Urine dip stick to check for UTI. ?

## 2021-07-27 DIAGNOSIS — M545 Low back pain, unspecified: Secondary | ICD-10-CM | POA: Diagnosis not present

## 2021-07-27 DIAGNOSIS — M1990 Unspecified osteoarthritis, unspecified site: Secondary | ICD-10-CM | POA: Diagnosis not present

## 2021-07-27 DIAGNOSIS — R0902 Hypoxemia: Secondary | ICD-10-CM | POA: Diagnosis not present

## 2021-07-27 DIAGNOSIS — I5022 Chronic systolic (congestive) heart failure: Secondary | ICD-10-CM | POA: Diagnosis not present

## 2021-07-27 DIAGNOSIS — F02811 Dementia in other diseases classified elsewhere, unspecified severity, with agitation: Secondary | ICD-10-CM | POA: Diagnosis not present

## 2021-07-27 DIAGNOSIS — G311 Senile degeneration of brain, not elsewhere classified: Secondary | ICD-10-CM | POA: Diagnosis not present

## 2021-07-27 DIAGNOSIS — G4733 Obstructive sleep apnea (adult) (pediatric): Secondary | ICD-10-CM | POA: Diagnosis not present

## 2021-07-27 DIAGNOSIS — Z743 Need for continuous supervision: Secondary | ICD-10-CM | POA: Diagnosis not present

## 2021-07-27 DIAGNOSIS — R4182 Altered mental status, unspecified: Secondary | ICD-10-CM | POA: Diagnosis not present

## 2021-07-27 DIAGNOSIS — I110 Hypertensive heart disease with heart failure: Secondary | ICD-10-CM | POA: Diagnosis not present

## 2021-07-27 DIAGNOSIS — I2510 Atherosclerotic heart disease of native coronary artery without angina pectoris: Secondary | ICD-10-CM | POA: Diagnosis not present

## 2021-07-27 DIAGNOSIS — R410 Disorientation, unspecified: Secondary | ICD-10-CM | POA: Diagnosis not present

## 2021-07-27 DIAGNOSIS — Z9981 Dependence on supplemental oxygen: Secondary | ICD-10-CM | POA: Diagnosis not present

## 2021-07-27 DIAGNOSIS — I4892 Unspecified atrial flutter: Secondary | ICD-10-CM | POA: Diagnosis not present

## 2021-07-28 ENCOUNTER — Telehealth: Payer: Self-pay | Admitting: Internal Medicine

## 2021-07-28 DIAGNOSIS — I4892 Unspecified atrial flutter: Secondary | ICD-10-CM | POA: Diagnosis not present

## 2021-07-28 DIAGNOSIS — I2510 Atherosclerotic heart disease of native coronary artery without angina pectoris: Secondary | ICD-10-CM | POA: Diagnosis not present

## 2021-07-28 DIAGNOSIS — G311 Senile degeneration of brain, not elsewhere classified: Secondary | ICD-10-CM | POA: Diagnosis not present

## 2021-07-28 DIAGNOSIS — I5022 Chronic systolic (congestive) heart failure: Secondary | ICD-10-CM | POA: Diagnosis not present

## 2021-07-28 DIAGNOSIS — F02811 Dementia in other diseases classified elsewhere, unspecified severity, with agitation: Secondary | ICD-10-CM | POA: Diagnosis not present

## 2021-07-28 DIAGNOSIS — I110 Hypertensive heart disease with heart failure: Secondary | ICD-10-CM | POA: Diagnosis not present

## 2021-08-17 NOTE — Telephone Encounter (Signed)
Pt has been in hospice care and they gave [place] a call at 11:57 am 08/26/21 to give [place] a courtesy call that patient had passed on Aug 26, 2021 at 9:11am. I updated pts chart as deceased ?

## 2021-08-17 DEATH — deceased

## 2021-09-17 ENCOUNTER — Other Ambulatory Visit: Payer: Medicare Other

## 2021-09-21 ENCOUNTER — Encounter: Payer: Medicare Other | Admitting: Internal Medicine
